# Patient Record
Sex: Female | Born: 1965 | Race: Black or African American | Hispanic: No | Marital: Single | Smoking: Never smoker
Health system: Southern US, Community
[De-identification: ages and names within clinical notes are randomized; demographics above are authoritative.]

## PROBLEM LIST (undated history)

## (undated) DIAGNOSIS — I10 Essential (primary) hypertension: Secondary | ICD-10-CM

## (undated) DIAGNOSIS — C169 Malignant neoplasm of stomach, unspecified: Secondary | ICD-10-CM

## (undated) DIAGNOSIS — D649 Anemia, unspecified: Secondary | ICD-10-CM

## (undated) DIAGNOSIS — Z5189 Encounter for other specified aftercare: Secondary | ICD-10-CM

## (undated) HISTORY — PX: COLON SURGERY: SHX602

## (undated) HISTORY — DX: Encounter for other specified aftercare: Z51.89

## (undated) HISTORY — PX: TUBAL LIGATION: SHX77

## (undated) HISTORY — DX: Malignant neoplasm of stomach, unspecified: C16.9

---

## 2001-04-16 ENCOUNTER — Ambulatory Visit (HOSPITAL_COMMUNITY): Admission: AD | Admit: 2001-04-16 | Discharge: 2001-04-16 | Payer: Self-pay | Admitting: *Deleted

## 2001-05-07 ENCOUNTER — Inpatient Hospital Stay (HOSPITAL_COMMUNITY): Admission: AD | Admit: 2001-05-07 | Discharge: 2001-05-09 | Payer: Self-pay | Admitting: *Deleted

## 2001-06-23 ENCOUNTER — Other Ambulatory Visit: Admission: RE | Admit: 2001-06-23 | Discharge: 2001-06-23 | Payer: Self-pay | Admitting: *Deleted

## 2005-05-07 ENCOUNTER — Emergency Department (HOSPITAL_COMMUNITY): Admission: EM | Admit: 2005-05-07 | Discharge: 2005-05-07 | Payer: Self-pay | Admitting: Emergency Medicine

## 2008-03-15 ENCOUNTER — Emergency Department (HOSPITAL_COMMUNITY): Admission: EM | Admit: 2008-03-15 | Discharge: 2008-03-15 | Payer: Self-pay | Admitting: Emergency Medicine

## 2009-11-20 ENCOUNTER — Emergency Department (HOSPITAL_COMMUNITY): Admission: EM | Admit: 2009-11-20 | Discharge: 2009-11-21 | Payer: Self-pay | Admitting: Emergency Medicine

## 2011-04-01 ENCOUNTER — Emergency Department (HOSPITAL_COMMUNITY)
Admission: EM | Admit: 2011-04-01 | Discharge: 2011-04-02 | Disposition: A | Discharge status: Home / Self Care | Payer: PRIVATE HEALTH INSURANCE | Attending: Emergency Medicine | Admitting: Emergency Medicine

## 2011-04-01 DIAGNOSIS — I10 Essential (primary) hypertension: Secondary | ICD-10-CM | POA: Insufficient documentation

## 2011-04-01 DIAGNOSIS — M2550 Pain in unspecified joint: Secondary | ICD-10-CM | POA: Insufficient documentation

## 2011-04-02 LAB — DIFFERENTIAL
Band Neutrophils: 0 % (ref 0–10)
Basophils Absolute: 0 10*3/uL (ref 0.0–0.1)
Basophils Relative: 0 % (ref 0–1)
Blasts: 0 %
Lymphocytes Relative: 22 % (ref 12–46)
Metamyelocytes Relative: 0 %
Monocytes Absolute: 0.6 10*3/uL (ref 0.1–1.0)
Monocytes Relative: 6 % (ref 3–12)
Neutro Abs: 7.4 10*3/uL (ref 1.7–7.7)
Neutrophils Relative %: 70 % (ref 43–77)
Promyelocytes Absolute: 0 %

## 2011-04-02 LAB — CBC
HCT: 39.1 % (ref 36.0–46.0)
Hemoglobin: 13 g/dL (ref 12.0–15.0)
MCHC: 33.2 g/dL (ref 30.0–36.0)
Platelets: 384 10*3/uL (ref 150–400)
RBC: 4.56 MIL/uL (ref 3.87–5.11)
RDW: 13.6 % (ref 11.5–15.5)
WBC: 10.5 10*3/uL (ref 4.0–10.5)

## 2011-04-02 LAB — POCT I-STAT, CHEM 8
BUN: 4 mg/dL — ABNORMAL LOW (ref 6–23)
Glucose, Bld: 91 mg/dL (ref 70–99)
Hemoglobin: 14.6 g/dL (ref 12.0–15.0)

## 2011-06-12 ENCOUNTER — Other Ambulatory Visit: Payer: Self-pay | Admitting: Internal Medicine

## 2011-06-12 DIAGNOSIS — Z1231 Encounter for screening mammogram for malignant neoplasm of breast: Secondary | ICD-10-CM

## 2011-06-27 ENCOUNTER — Ambulatory Visit
Admission: RE | Admit: 2011-06-27 | Discharge: 2011-06-27 | Disposition: A | Discharge status: Home / Self Care | Payer: PRIVATE HEALTH INSURANCE | Source: Ambulatory Visit | Attending: Internal Medicine | Admitting: Internal Medicine

## 2011-06-27 DIAGNOSIS — Z1231 Encounter for screening mammogram for malignant neoplasm of breast: Secondary | ICD-10-CM

## 2011-07-17 LAB — RAPID STREP SCREEN (MED CTR MEBANE ONLY): Streptococcus, Group A Screen (Direct): POSITIVE — AB

## 2013-07-14 ENCOUNTER — Other Ambulatory Visit: Payer: Self-pay | Admitting: Family Medicine

## 2013-07-14 DIAGNOSIS — Z1231 Encounter for screening mammogram for malignant neoplasm of breast: Secondary | ICD-10-CM

## 2013-08-17 ENCOUNTER — Ambulatory Visit
Admission: RE | Admit: 2013-08-17 | Discharge: 2013-08-17 | Disposition: A | Discharge status: Home / Self Care | Payer: PRIVATE HEALTH INSURANCE | Source: Ambulatory Visit | Attending: Family Medicine | Admitting: Family Medicine

## 2013-08-17 DIAGNOSIS — Z1231 Encounter for screening mammogram for malignant neoplasm of breast: Secondary | ICD-10-CM

## 2015-06-02 ENCOUNTER — Emergency Department (HOSPITAL_COMMUNITY)
Admission: EM | Admit: 2015-06-02 | Discharge: 2015-06-02 | Disposition: A | Discharge status: Home / Self Care | Payer: PRIVATE HEALTH INSURANCE | Attending: Emergency Medicine | Admitting: Emergency Medicine

## 2015-06-02 ENCOUNTER — Encounter (HOSPITAL_COMMUNITY): Payer: Self-pay

## 2015-06-02 DIAGNOSIS — J020 Streptococcal pharyngitis: Secondary | ICD-10-CM | POA: Diagnosis not present

## 2015-06-02 DIAGNOSIS — J029 Acute pharyngitis, unspecified: Secondary | ICD-10-CM | POA: Diagnosis present

## 2015-06-02 LAB — RAPID STREP SCREEN (MED CTR MEBANE ONLY): STREPTOCOCCUS, GROUP A SCREEN (DIRECT): POSITIVE — AB

## 2015-06-02 MED ORDER — PENICILLIN V POTASSIUM 500 MG PO TABS: 500.0000 mg | ORAL_TABLET | Freq: Three times a day (TID) | ORAL | Status: AC

## 2015-06-02 MED ORDER — IBUPROFEN 600 MG PO TABS: 600.0000 mg | ORAL_TABLET | Freq: Four times a day (QID) | ORAL | Status: DC | PRN

## 2015-06-02 MED ORDER — DEXAMETHASONE SODIUM PHOSPHATE 10 MG/ML IJ SOLN
10.0000 mg | Freq: Once | INTRAMUSCULAR | Status: AC
  Administered 2015-06-02: 10 mg via INTRAMUSCULAR
  Filled 2015-06-02: qty 1

## 2015-06-02 NOTE — Discharge Instructions (Signed)
Ibuprofen for pain. Penicillin as prescribed until all gone for infection. Salt water gargles. Rest. Fluids. Follow up with primary care doctor as needed. Return if worsening.   Strep Throat Strep throat is an infection of the throat caused by a bacteria named Streptococcus pyogenes. Your health care provider may call the infection streptococcal "tonsillitis" or "pharyngitis" depending on whether there are signs of inflammation in the tonsils or back of the throat. Strep throat is most common in children aged 5-15 years during the cold months of the year, but it can occur in people of any age during any season. This infection is spread from person to person (contagious) through coughing, sneezing, or other close contact. SIGNS AND SYMPTOMS   Fever or chills.  Painful, swollen, red tonsils or throat.  Pain or difficulty when swallowing.  White or yellow spots on the tonsils or throat.  Swollen, tender lymph nodes or "glands" of the neck or under the jaw.  Red rash all over the body (rare). DIAGNOSIS  Many different infections can cause the same symptoms. A test must be done to confirm the diagnosis so the right treatment can be given. A "rapid strep test" can help your health care provider make the diagnosis in a few minutes. If this test is not available, a light swab of the infected area can be used for a throat culture test. If a throat culture test is done, results are usually available in a day or two. TREATMENT  Strep throat is treated with antibiotic medicine. HOME CARE INSTRUCTIONS   Gargle with 1 tsp of salt in 1 cup of warm water, 3-4 times per day or as needed for comfort.  Family members who also have a sore throat or fever should be tested for strep throat and treated with antibiotics if they have the strep infection.  Make sure everyone in your household washes their hands well.  Do not share food, drinking cups, or personal items that could cause the infection to spread to  others.  You may need to eat a soft food diet until your sore throat gets better.  Drink enough water and fluids to keep your urine clear or pale yellow. This will help prevent dehydration.  Get plenty of rest.  Stay home from school, day care, or work until you have been on antibiotics for 24 hours.  Take medicines only as directed by your health care provider.  Take your antibiotic medicine as directed by your health care provider. Finish it even if you start to feel better. SEEK MEDICAL CARE IF:   The glands in your neck continue to enlarge.  You develop a rash, cough, or earache.  You cough up green, yellow-brown, or bloody sputum.  You have pain or discomfort not controlled by medicines.  Your problems seem to be getting worse rather than better.  You have a fever. SEEK IMMEDIATE MEDICAL CARE IF:   You develop any new symptoms such as vomiting, severe headache, stiff or painful neck, chest pain, shortness of breath, or trouble swallowing.  You develop severe throat pain, drooling, or changes in your voice.  You develop swelling of the neck, or the skin on the neck becomes red and tender.  You develop signs of dehydration, such as fatigue, dry mouth, and decreased urination.  You become increasingly sleepy, or you cannot wake up completely. MAKE SURE YOU:  Understand these instructions.  Will watch your condition.  Will get help right away if you are not doing well or  get worse. Document Released: 10/04/2000 Document Revised: 02/21/2014 Document Reviewed: 12/06/2010 [facility] Patient Information 2015 [place], [place]. This information is not intended to replace advice given to you by your health care provider. Make sure you discuss any questions you have with your health care provider.

## 2015-06-02 NOTE — ED Notes (Signed)
Patient c/o sore throat and swollen neck glands since yesterday. Patient denies any cough, sinus congestion, or headache.

## 2015-06-02 NOTE — ED Provider Notes (Signed)
CSN: [id number]     Arrival date & time 06/02/15  6073 History   First MD Initiated Contact with Patient 06/02/15 [phone]     Chief Complaint  Patient presents with  . Sore Throat  . Lymphadenopathy     (Consider location/radiation/quality/duration/timing/severity/associated sxs/prior Treatment) HPI Linda [name] is a 49 y.o. female medical problems, presents to emergency department complaining of sore throat and swollen lymph nodes in the neck. Patient states symptoms started yesterday. She states that she has not tried anything at home for her symptoms. She denies any fever or chills. Denies any nasal congestion cough, headache. No neck pain or stiffness. She states she has had strep in the past and it feels the same.  History reviewed. No pertinent past medical history. Past Surgical History  Procedure Laterality Date  . Cesarean section      x4   Family History  Problem Relation Age of Onset  . Hypertension Father    Social History  Substance Use Topics  . Smoking status: Never Smoker   . Smokeless tobacco: Never Used  . Alcohol Use: No   OB History    No data available     Review of Systems  Constitutional: Negative for fever and chills.  HENT: Positive for sore throat. Negative for congestion, mouth sores and trouble swallowing.   Respiratory: Negative for cough, chest tightness and shortness of breath.   Cardiovascular: Negative for chest pain, palpitations and leg swelling.  Musculoskeletal: Negative for myalgias, arthralgias, neck pain and neck stiffness.  Skin: Negative for rash.  Neurological: Negative for dizziness, weakness and headaches.  All other systems reviewed and are negative.     Allergies  Review of patient's allergies indicates no known allergies.  Home Medications   Prior to Admission medications   Medication Sig Start Date End Date Taking? Authorizing Provider  Aspirin-Caffeine 845-65 MG PACK Take 1 packet by mouth every 4 (four) hours as  needed (for pain).   Yes Historical Provider, MD   BP 126/78 mmHg  Pulse 100  Temp(Src) 99 F (37.2 C) (Oral)  Resp 16  Ht 5\' 4"  (1.626 m)  Wt 198 lb (89.812 kg)  BMI 33.97 kg/m2  SpO2 95% Physical Exam  Constitutional: She appears well-developed and well-nourished. No distress.  HENT:  Head: Normocephalic and atraumatic.  Right Ear: External ear normal.  Left Ear: External ear normal.  Nose: Nose normal.  Bilaterally enlarged tonsils with exudate. Uvula midline.   Eyes: Conjunctivae are normal.  Neck: Normal range of motion. Neck supple.  Cardiovascular: Normal rate, regular rhythm and normal heart sounds.   Pulmonary/Chest: Effort normal and breath sounds normal. No respiratory distress. She has no wheezes. She has no rales.  Musculoskeletal: She exhibits no edema.  Lymphadenopathy:    She has cervical adenopathy.  Neurological: She is alert.  Skin: Skin is warm and dry.  Psychiatric: She has a normal mood and affect. Her behavior is normal.  Nursing note and vitals reviewed.   ED Course  Procedures (including critical care time) Labs Review Labs Reviewed  RAPID STREP SCREEN (NOT AT [facility]) - Abnormal; Notable for the following:    Streptococcus, Group A Screen (Direct) POSITIVE (*)    All other components within normal limits    Imaging Review No results found. Duane Lope A, personally reviewed and evaluated these images and lab results as part of my medical decision-making.   EKG Interpretation None      MDM   Final diagnoses:  Strep throat    patient with bilaterally enlarged tonsils that are erythematous with exudate. Uvula is midline. Vital signs are normal. Patient is in no distress, swallowing without difficulty. Patient's strep screen is positive. Will discharge home with penicillin, Tylenol Motrin for pain, saltwater gargles. Follow-up as needed. No evidence of peritonsillar abscess at this time.   Filed Vitals:   06/02/15 0943  BP:  126/78  Pulse: 100  Temp: 99 F (37.2 C)  Resp: [id number], PA-C 06/02/15 1638  Lacretia Leigh, MD 06/03/15 1051

## 2019-05-02 ENCOUNTER — Other Ambulatory Visit: Payer: Self-pay

## 2019-05-02 ENCOUNTER — Emergency Department (HOSPITAL_COMMUNITY)
Admission: EM | Admit: 2019-05-02 | Discharge: 2019-05-02 | Disposition: A | Discharge status: Home / Self Care | Payer: PRIVATE HEALTH INSURANCE | Attending: Emergency Medicine | Admitting: Emergency Medicine

## 2019-05-02 ENCOUNTER — Encounter (HOSPITAL_COMMUNITY): Payer: Self-pay

## 2019-05-02 DIAGNOSIS — H9201 Otalgia, right ear: Secondary | ICD-10-CM | POA: Diagnosis present

## 2019-05-02 DIAGNOSIS — H60501 Unspecified acute noninfective otitis externa, right ear: Secondary | ICD-10-CM | POA: Insufficient documentation

## 2019-05-02 MED ORDER — CIPRODEX 0.3-0.1 % OT SUSP: 3.0000 [drp] | Freq: Two times a day (BID) | OTIC | 0 refills | Status: AC

## 2019-05-02 NOTE — Discharge Instructions (Signed)
Place three drops of the antibiotic in the right ear twice a day for one week Take Ibuprofen or Tylenol for pain Return if worsening

## 2019-05-02 NOTE — ED Provider Notes (Signed)
[facility] EM[facility]GENCY DEPARTMENT Provider Note   CSN: [id number] Arrival date & time: 05/02/19  0645     History   Chief Complaint Chief Complaint  Patient presents with  . Otalgia    HPI Linda [name] is a 53 y.o. female presents with right ear pain and swelling.  No significant past medical history.  The patient states that she noticed some swelling discomfort of the right ear about 2 to 3 days ago.  Pain is gradually worsening.  Nothing makes it better or worse.  She has never had this before.  This morning when she woke up the pain was more significant so she decided to come to the emergency department.  She has not put anything in her ear and she has not been swimming recently.  There has not been any drainage coming out of the ear and she states that her hearing is unaffected.  No fever or chills     HPI  History reviewed. No pertinent past medical history.  There are no active problems to display for this patient.   Past Surgical History:  Procedure Laterality Date  . CESAREAN SECTION     x4     OB History   No obstetric history on file.      Home Medications    Prior to Admission medications   Medication Sig Start Date End Date Taking? Authorizing Provider  Aspirin-Caffeine 845-65 MG PACK Take 1 packet by mouth every 4 (four) hours as needed (for pain).    [provider]  ciprofloxacin-dexamethasone (CIPRODEX) OTIC suspension Place 3 drops into the right ear 2 (two) times daily for 7 days. 05/02/19 05/09/19  Recardo Evangelist, PA-C  ibuprofen (ADVIL,MOTRIN) 600 MG tablet Take 1 tablet (600 mg total) by mouth every 6 (six) hours as needed. 06/02/15   Jeannett Senior, PA-C    Family History Family History  Problem Relation Age of Onset  . Hypertension Father     Social History Social History   Tobacco Use  . Smoking status: Never Smoker  . Smokeless tobacco: Never Used  Substance Use Topics  . Alcohol use: No  . Drug  use: No     Allergies   Patient has no known allergies.   Review of Systems Review of Systems  Constitutional: Negative for fever.  HENT: Positive for ear pain. Negative for ear discharge.      Physical Exam Updated Vital Signs BP (!) 176/89 (BP Location: Right Arm)   Pulse 81   Temp 98.5 F (36.9 C) (Oral)   Resp 18   SpO2 98%   Physical Exam Vitals signs and nursing note reviewed.  Constitutional:      General: She is not in acute distress.    Appearance: She is well-developed. She is not ill-appearing.  HENT:     Head: Normocephalic and atraumatic.     Right Ear: Hearing normal. Swelling (Swelling of the inner ear canal and the external concha. No significant drainage) and tenderness (Tragal tenderness) present. No drainage. No mastoid tenderness.     Left Ear: Hearing, tympanic membrane, ear canal and external ear normal.  Eyes:     General: No scleral icterus.       Right eye: No discharge.        Left eye: No discharge.     Conjunctiva/sclera: Conjunctivae normal.     Pupils: Pupils are equal, round, and reactive to light.  Neck:     Musculoskeletal: Normal range of  motion.  Cardiovascular:     Rate and Rhythm: Normal rate.  Pulmonary:     Effort: Pulmonary effort is normal. No respiratory distress.  Abdominal:     General: There is no distension.  Skin:    General: Skin is warm and dry.  Neurological:     Mental Status: She is alert and oriented to person, place, and time.  Psychiatric:        Behavior: Behavior normal.      ED Treatments / Results  Labs (all labs ordered are listed, but only abnormal results are displayed) Labs Reviewed - No data to display  EKG None  Radiology No results found.  Procedures Procedures (including critical care time)  Medications Ordered in ED Medications - No data to display   Initial Impression / Assessment and Plan / ED Course  I have reviewed the triage vital signs and the nursing notes.   Pertinent labs & imaging results that were available during my care of the patient were reviewed by me and considered in my medical decision making (see chart for details).  53 year old female presents with right ear pain and swelling for the past several days.  She is mildly hypertensive but otherwise vital signs are normal.  She is not acutely ill appearing.  Exam is consistent with otitis externa.  There is no mastoid tenderness or erythema.  An ear wick was placed in the ear canal and Ciprodex was administered.  She was given prescription for Ciprodex.  Return precautions given  Final Clinical Impressions(s) / ED Diagnoses   Final diagnoses:  Acute otitis externa of right ear, unspecified type    ED Discharge Orders         Ordered    ciprofloxacin-dexamethasone (CIPRODEX) OTIC suspension  2 times daily     05/02/19 0716           Recardo Evangelist, PA-C 05/02/19 6015    Charlesetta Shanks, MD 05/02/19 [phone]

## 2019-05-02 NOTE — ED Triage Notes (Signed)
Pt states yesterday her R ear began to feel tender on palpation. Then this morning when the patient woke up her was swollen and now painful to touch. From home

## 2019-05-02 NOTE — ED Notes (Signed)
Patient verbalizes understanding of discharge instructions. Opportunity for questioning and answers were provided. Armband removed by staff, pt discharged from ED.  

## 2022-01-09 DIAGNOSIS — M25559 Pain in unspecified hip: Secondary | ICD-10-CM | POA: Diagnosis not present

## 2022-02-18 DIAGNOSIS — R7303 Prediabetes: Secondary | ICD-10-CM

## 2022-02-18 HISTORY — DX: Prediabetes: R73.03

## 2022-02-20 ENCOUNTER — Ambulatory Visit: Payer: BLUE CROSS/BLUE SHIELD | Admitting: Nurse Practitioner

## 2022-02-20 ENCOUNTER — Encounter: Payer: Self-pay | Admitting: Nurse Practitioner

## 2022-02-20 VITALS — BP 144/86 | HR 70 | Temp 98.1°F | Ht 63.6 in | Wt 219.0 lb

## 2022-02-20 DIAGNOSIS — E6609 Other obesity due to excess calories: Secondary | ICD-10-CM

## 2022-02-20 DIAGNOSIS — Z23 Encounter for immunization: Secondary | ICD-10-CM

## 2022-02-20 DIAGNOSIS — I10 Essential (primary) hypertension: Secondary | ICD-10-CM | POA: Diagnosis not present

## 2022-02-20 DIAGNOSIS — Z1211 Encounter for screening for malignant neoplasm of colon: Secondary | ICD-10-CM

## 2022-02-20 DIAGNOSIS — R5383 Other fatigue: Secondary | ICD-10-CM

## 2022-02-20 DIAGNOSIS — Z79899 Other long term (current) drug therapy: Secondary | ICD-10-CM | POA: Diagnosis not present

## 2022-02-20 DIAGNOSIS — Z6838 Body mass index (BMI) 38.0-38.9, adult: Secondary | ICD-10-CM

## 2022-02-20 DIAGNOSIS — Z7689 Persons encountering health services in other specified circumstances: Secondary | ICD-10-CM

## 2022-02-20 DIAGNOSIS — D649 Anemia, unspecified: Secondary | ICD-10-CM | POA: Diagnosis not present

## 2022-02-20 DIAGNOSIS — Z1231 Encounter for screening mammogram for malignant neoplasm of breast: Secondary | ICD-10-CM

## 2022-02-20 NOTE — Patient Instructions (Addendum)
Obesity, Adult ?Obesity is having too much body fat. Being obese means that your weight is more than what is healthy for you.  ?BMI (body mass index) is a number that explains how much body fat you have. If you have a BMI of 30 or more, you are obese. ?Obesity can cause serious health problems, such as: ?Stroke. ?Coronary artery disease (CAD). ?Type 2 diabetes. ?Some types of cancer. ?High blood pressure (hypertension). ?High cholesterol. ?Gallbladder stones. ?Obesity can also contribute to: ?Osteoarthritis. ?Sleep apnea. ?Infertility problems. ?What are the causes? ?Eating meals each day that are high in calories, sugar, and fat. ?Drinking a lot of drinks that have sugar in them. ?Being born with genes that may make you more likely to become obese. ?Having a medical condition that causes obesity. ?Taking certain medicines. ?Sitting a lot (having a sedentary lifestyle). ?Not getting enough sleep. ?What increases the risk? ?Having a family history of obesity. ?Living in an area with limited access to: ?Parks, recreation centers, or sidewalks. ?Healthy food choices, such as grocery stores and farmers' markets. ?What are the signs or symptoms? ?The main sign is having too much body fat. ?How is this treated? ?Treatment for this condition often includes changing your lifestyle. Treatment may include: ?Changing your diet. This may include making a healthy meal plan. ?Exercise. This may include activity that causes your heart to beat faster (aerobic exercise) and strength training. Work with your doctor to design a program that works for you. ?Medicine to help you lose weight. This may be used if you are not able to lose one pound a week after 6 weeks of healthy eating and more exercise. ?Treating conditions that cause the obesity. ?Surgery. Options may include gastric banding and gastric bypass. This may be done if: ?Other treatments have not helped to improve your condition. ?You have a BMI of 40 or higher. ?You have  life-threatening health problems related to obesity. ?Follow these instructions at home: ?Eating and drinking ? ?Follow advice from your doctor about what to eat and drink. Your doctor may tell you to: ?Limit fast food, sweets, and processed snack foods. ?Choose low-fat options. For example, choose low-fat milk instead of whole milk. ?Eat five or more servings of fruits or vegetables each day. ?Eat at home more often. This gives you more control over what you eat. ?Choose healthy foods when you eat out. ?Learn to read food labels. This will help you learn how much food is in one serving. ?Keep low-fat snacks available. ?Avoid drinks that have a lot of sugar in them. These include soda, fruit juice, iced tea with sugar, and flavored milk. ?Drink enough water to keep your pee (urine) pale yellow. ?Do not go on fad diets. ?Physical activity ?Exercise often, as told by your doctor. Most adults should get up to 150 minutes of moderate-intensity exercise every week.Ask your doctor: ?What types of exercise are safe for you. ?How often you should exercise. ?Warm up and stretch before being active. ?Do slow stretching after being active (cool down). ?Rest between times of being active. ?Lifestyle ?Work with your doctor and a food expert (dietitian) to set a weight-loss goal that is best for you. ?Limit your screen time. ?Find ways to reward yourself that do not involve food. ?Do not drink alcohol if: ?Your doctor tells you not to drink. ?You are pregnant, may be pregnant, or are planning to become pregnant. ?If you drink alcohol: ?Limit how much you have to: ?0-1 drink a day for women. ?0-2 drinks   a day for men. ?Know how much alcohol is in your drink. In the [place] one drink equals one 12 oz bottle of beer (355 mL), one 5 oz glass of wine (148 mL), or one 1? oz glass of hard liquor (44 mL). ?General instructions ?Keep a weight-loss journal. This can help you keep track of: ?The food that you eat. ?How much exercise you  get. ?Take over-the-counter and prescription medicines only as told by your doctor. ?Take vitamins and supplements only as told by your doctor. ?Think about joining a support group. ?Pay attention to your mental health as obesity can lead to depression or self esteem issues. ?Keep all follow-up visits. ?Contact a doctor if: ?You cannot meet your weight-loss goal after you have changed your diet and lifestyle for 6 weeks. ?You are having trouble breathing. ?Summary ?Obesity is having too much body fat. ?Being obese means that your weight is more than what is healthy for you. ?Work with your doctor to set a weight-loss goal. ?Get regular exercise as told by your doctor. ?This information is not intended to replace advice given to you by your health care provider. Make sure you discuss any questions you have with your health care provider. ?Document Revised: 05/15/2021 Document Reviewed: 05/15/2021 ?Elsevier Patient Education ? [place]. ? ?Goal to exercise 150 minutes per week with at least 2 days of strength training ?Encouraged to park further when at the store, take stairs instead of elevators and to walk in place during commercials. ?Increase water intake to at least one gallon of water daily. ?Avoid sodas, juices and sugars ?Cut back on carbs (white) ? ?

## 2022-02-20 NOTE — Progress Notes (Signed)
?Industrial/product designer as a Education administrator for [employer], [name].,have documented all relevant documentation on the behalf of [name], [name],as directed by  [name], [name] while in the presence of [name], [place]. ? ?This visit occurred during the SARS-CoV-2 public health emergency.  Safety protocols were in place, including screening questions prior to the visit, additional usage of staff PPE, and extensive cleaning of exam room while observing appropriate contact time as indicated for disinfecting solutions. ? ?Subjective:  ?  ? Patient ID: Linda [name] , female    DOB: 1965/10/29 , 56 y.o.   MRN: [id number] ? ? ?Chief Complaint  ?Patient presents with  ? Establish Care  ? ? ?HPI ? ?Patient is here to establish care. She was referred by her daughter K.S. who is a patient here.  She has not seen a PCP for 4 years. She does office work - sedentary. Single. She has 4 children - adults.  They live local. 3 sons and 1 daughter.  She has concerns with her weight. She does not have a GYN. She was going to [facility] Dr. Tamala Julian ? ?PMH - HTN - fluid pill.  ? ?[facility] - brother - HTN.  ? ?BP Readings from Last 3 Encounters: ?02/20/22 : (!) 144/86 ?05/02/19 : (!) 143/87 ?06/02/15 : 126/78 ? ?She reports her weight is the heaviest she has been. She sits for her job. Does not exercise. She eats breakfast and then once when she gets home from work. She has a problem with her hip on the right. She will get a pain in her hip and unable to lift her leg but not constant for the last 2-3 years. Her lowest weight has been 160 lbs in the last 10 years.  She does not drink a lot of water, only about 20 oz. oJ, tea or soft drink. She eats ice all day. Never been tested for anemia. She wants to be able to do more. Wakes up she is "exhausted" and when she goes up the stairs she is exhausted. Feels if she loses weight she will be more active. She has been on weight loss medications at [facility] but unsure of the name. It did work but not to her  satisfaction.   ? ?  ? ?History reviewed. No pertinent past medical history.  ? ?Family History  ?Problem Relation Age of Onset  ? Memory loss Mother   ? Hypertension Father   ? Diabetes Maternal Grandmother   ? Hypertension Maternal Grandmother   ? Hypertension Maternal Grandfather   ? ? ? ?Current Outpatient Medications:  ?  ferrous sulfate 325 (65 FE) MG tablet, Take 1 tablet (325 mg total) by mouth daily., Disp: 30 tablet, Rfl: 0 ?  metFORMIN (GLUCOPHAGE) 500 MG tablet, Take 1 tablet (500 mg total) by mouth 2 (two) times daily with a meal. (Patient not taking: Reported on 02/22/2022), Disp: 60 tablet, Rfl: 2 ?  Vitamin D, Ergocalciferol, (DRISDOL) 1.25 MG (50000 UNIT) CAPS capsule, Take 1 capsule (50,000 Units total) by mouth 2 (two) times a week. (Patient not taking: Reported on 02/22/2022), Disp: 24 capsule, Rfl: 1  ? ?No Known Allergies  ? ?Review of Systems  ?Constitutional:  Positive for fatigue.  ?Respiratory: Negative.    ?Cardiovascular: Negative.   ?Gastrointestinal: Negative.   ?Neurological: Negative.   ?Psychiatric/Behavioral: Negative.     ? ?Today's Vitals  ? 02/20/22 1616  ?BP: (!) 144/86  ?Pulse: 70  ?Temp: 98.1 ?F (36.7 ?C)  ?TempSrc: Oral  ?Weight: 219  lb (99.3 kg)  ?Height: 5' 3.6" (1.615 m)  ? ?Body mass index is 38.07 kg/m?.  ? ?Objective:  ?Physical Exam ?Vitals reviewed.  ?Constitutional:   ?   General: She is not in acute distress. ?   Appearance: Normal appearance. She is well-developed. She is obese.  ?HENT:  ?   Head: Normocephalic and atraumatic.  ?Eyes:  ?   Pupils: Pupils are equal, round, and reactive to light.  ?Cardiovascular:  ?   Rate and Rhythm: Normal rate and regular rhythm.  ?   Pulses: Normal pulses.  ?   Heart sounds: Normal heart sounds. No murmur heard. ?Pulmonary:  ?   Effort: Pulmonary effort is normal. No respiratory distress.  ?   Breath sounds: Normal breath sounds. No wheezing.  ?Skin: ?   General: Skin is warm and dry.  ?   Capillary Refill: Capillary refill takes  less than 2 seconds.  ?Neurological:  ?   General: No focal deficit present.  ?   Mental Status: She is alert and oriented to person, place, and time.  ?   Cranial Nerves: No cranial nerve deficit.  ?   Motor: No weakness.  ?Psychiatric:     ?   Mood and Affect: Mood normal.     ?   Behavior: Behavior normal.     ?   Thought Content: Thought content normal.     ?   Judgment: Judgment normal.  ?  ? ?   ?Assessment And Plan:  ?   ?1. Other fatigue ?Comments: Will check for metabolic causes. Denies shortness of breath ?- TSH ?- Hemoglobin A1c ?- Vitamin B12 ?- CBC ?- Vitamin D (25 hydroxy) ? ?2. Essential hypertension ?Comments: Blood pressure is elevated, will consider starting her on a blood pressure medication at her next visit. Encouraged to eat a low salt diet. ?- CMP14+EGFR ? ?3. Class 2 obesity due to excess calories without serious comorbidity with body mass index (BMI) of 38.0 to 38.9 in adult ?Comments: Discussion about weight loss options however would like to evaluate her fatigue first. Encouraged to keep a food log and increase activity as tolerated ? ?4. Encounter for screening colonoscopy ?According to USPTF Colorectal cancer Screening guidelines. Colonoscopy is recommended every 10 years, starting at age 65 years. ?Will refer to GI for colon cancer screening. ?- Ambulatory referral to Gastroenterology ? ?5. Encounter for screening mammogram for breast cancer ?Pt instructed on Self Breast Exam.According to ACOG guidelines Women aged 42 and older are recommended to get an annual mammogram. Form completed and given to patient contact the [facility] for appointment scheduing.  ?Pt encouraged to get annual mammogram ?- MM Digital Screening; Future ? ?6. Need for Tdap vaccination ?Will give tetanus vaccine today while in office. Refer to order management. TDAP will be administered to adults 32-73 years old every 10 years. ?- Tdap vaccine greater than or equal to 7yo IM ? ?7. Establishing care with new  doctor, encounter for ?She had been a patient at [facility] 4 years ago will have her to sign a medical record release form.   ? ? ?Patient was given opportunity to ask questions. Patient verbalized understanding of the plan and was able to repeat key elements of the plan. All questions were answered to their satisfaction.  ?[name], [name]  ? ?I, [name], [name], have reviewed all documentation for this visit. The documentation on 02/22/22 for the exam, diagnosis, procedures, and orders are all accurate and complete.  ? ?  IF YOU HAVE BEEN REFERRED TO A SPECIALIST, IT MAY TAKE 1-2 WEEKS TO SCHEDULE/PROCESS THE REFERRAL. IF YOU HAVE NOT HEARD FROM US/SPECIALIST IN TWO WEEKS, PLEASE GIVE [place] A CALL AT [phone].  ? ?THE PATIENT IS ENCOURAGED TO PRACTICE SOCIAL DISTANCING DUE TO THE COVID-19 PANDEMIC.   ?

## 2022-02-21 ENCOUNTER — Encounter (HOSPITAL_COMMUNITY): Payer: Self-pay | Admitting: Emergency Medicine

## 2022-02-21 ENCOUNTER — Other Ambulatory Visit: Payer: Self-pay | Admitting: Nurse Practitioner

## 2022-02-21 ENCOUNTER — Other Ambulatory Visit (HOSPITAL_COMMUNITY): Payer: Self-pay

## 2022-02-21 ENCOUNTER — Other Ambulatory Visit: Payer: Self-pay

## 2022-02-21 ENCOUNTER — Emergency Department (HOSPITAL_COMMUNITY)
Admission: EM | Admit: 2022-02-21 | Discharge: 2022-02-22 | Disposition: A | Discharge status: Home / Self Care | Payer: BLUE CROSS/BLUE SHIELD | Attending: Emergency Medicine | Admitting: Emergency Medicine

## 2022-02-21 DIAGNOSIS — R195 Other fecal abnormalities: Secondary | ICD-10-CM

## 2022-02-21 DIAGNOSIS — D649 Anemia, unspecified: Secondary | ICD-10-CM

## 2022-02-21 DIAGNOSIS — R5383 Other fatigue: Secondary | ICD-10-CM

## 2022-02-21 DIAGNOSIS — D509 Iron deficiency anemia, unspecified: Secondary | ICD-10-CM | POA: Insufficient documentation

## 2022-02-21 DIAGNOSIS — R799 Abnormal finding of blood chemistry, unspecified: Secondary | ICD-10-CM | POA: Diagnosis not present

## 2022-02-21 DIAGNOSIS — D75839 Thrombocytosis, unspecified: Secondary | ICD-10-CM

## 2022-02-21 DIAGNOSIS — D696 Thrombocytopenia, unspecified: Secondary | ICD-10-CM | POA: Diagnosis not present

## 2022-02-21 DIAGNOSIS — K921 Melena: Secondary | ICD-10-CM | POA: Diagnosis not present

## 2022-02-21 LAB — BASIC METABOLIC PANEL
Anion gap: 7 (ref 5–15)
BUN: 10 mg/dL (ref 6–20)
CO2: 24 mmol/L (ref 22–32)
Calcium: 9.3 mg/dL (ref 8.9–10.3)
Chloride: 106 mmol/L (ref 98–111)
Creatinine, Ser: 0.86 mg/dL (ref 0.44–1.00)
GFR, Estimated: >60 mL/min (ref >60)
Glucose, Bld: 99 mg/dL (ref 70–99)
Potassium: 3.5 mmol/L (ref 3.5–5.1)
Sodium: 137 mmol/L (ref 135–145)

## 2022-02-21 LAB — CMP14+EGFR
ALT: 10 IU/L (ref 0–32)
AST: 15 IU/L (ref 0–40)
Albumin/Globulin Ratio: 1 — ABNORMAL LOW (ref 1.2–2.2)
Albumin: 4.6 g/dL (ref 3.8–4.9)
Alkaline Phosphatase: 114 IU/L (ref 44–121)
BUN/Creatinine Ratio: 10 (ref 9–23)
BUN: 8 mg/dL (ref 6–24)
Bilirubin Total: 0.3 mg/dL (ref 0.0–1.2)
CO2: 22 mmol/L (ref 20–29)
Calcium: 9.6 mg/dL (ref 8.7–10.2)
Chloride: 101 mmol/L (ref 96–106)
Creatinine, Ser: 0.81 mg/dL (ref 0.57–1.00)
Globulin, Total: 4.5 g/dL (ref 1.5–4.5)
Glucose: 84 mg/dL (ref 70–99)
Potassium: 4.1 mmol/L (ref 3.5–5.2)
Sodium: 138 mmol/L (ref 134–144)
Total Protein: 9.1 g/dL — ABNORMAL HIGH (ref 6.0–8.5)
eGFR: 86 mL/min/{1.73_m2} (ref >59)

## 2022-02-21 LAB — CBC WITH DIFFERENTIAL/PLATELET
Abs Immature Granulocytes: 0.03 10*3/uL (ref 0.00–0.07)
Basophils Absolute: 0 10*3/uL (ref 0.0–0.1)
Basophils Relative: 0 %
Eosinophils Absolute: 0.1 10*3/uL (ref 0.0–0.5)
Eosinophils Relative: 1 %
HCT: 26.4 % — ABNORMAL LOW (ref 36.0–46.0)
Hemoglobin: 7 g/dL — ABNORMAL LOW (ref 12.0–15.0)
Immature Granulocytes: 0 %
Lymphocytes Relative: 31 %
Lymphs Abs: 3.1 10*3/uL (ref 0.7–4.0)
MCH: 17.5 pg — ABNORMAL LOW (ref 26.0–34.0)
MCHC: 26.5 g/dL — ABNORMAL LOW (ref 30.0–36.0)
MCV: 65.8 fL — ABNORMAL LOW (ref 80.0–100.0)
Monocytes Absolute: 0.6 10*3/uL (ref 0.1–1.0)
Monocytes Relative: 6 %
Neutro Abs: 5.9 10*3/uL (ref 1.7–7.7)
Neutrophils Relative %: 62 %
Platelets: 419 10*3/uL — ABNORMAL HIGH (ref 150–400)
RBC: 4.01 MIL/uL (ref 3.87–5.11)
RDW: 19.7 % — ABNORMAL HIGH (ref 11.5–15.5)
WBC: 9.7 10*3/uL (ref 4.0–10.5)
nRBC: 0 % (ref 0.0–0.2)

## 2022-02-21 LAB — CBC
Hematocrit: 25.2 % — ABNORMAL LOW (ref 34.0–46.6)
Hemoglobin: 6.6 g/dL — CL (ref 11.1–15.9)
MCH: 16.9 pg — ABNORMAL LOW (ref 26.6–33.0)
MCHC: 26.2 g/dL — ABNORMAL LOW (ref 31.5–35.7)
MCV: 65 fL — ABNORMAL LOW (ref 79–97)
Platelets: 435 10*3/uL (ref 150–450)
RBC: 3.91 x10E6/uL (ref 3.77–5.28)
RDW: 18.1 % — ABNORMAL HIGH (ref 11.7–15.4)
WBC: 9.2 10*3/uL (ref 3.4–10.8)

## 2022-02-21 LAB — HEMOGLOBIN A1C
Est. average glucose Bld gHb Est-mCnc: 128 mg/dL
Hgb A1c MFr Bld: 6.1 % — ABNORMAL HIGH (ref 4.8–5.6)

## 2022-02-21 LAB — TSH: TSH: 1.41 u[IU]/mL (ref 0.450–4.500)

## 2022-02-21 LAB — VITAMIN B12: Vitamin B-12: 517 pg/mL (ref 232–1245)

## 2022-02-21 LAB — ABO/RH: ABO/RH(D): O POS

## 2022-02-21 LAB — VITAMIN D 25 HYDROXY (VIT D DEFICIENCY, FRACTURES): Vit D, 25-Hydroxy: 5.8 ng/mL — ABNORMAL LOW (ref 30.0–100.0)

## 2022-02-21 MED ORDER — VITAMIN D (ERGOCALCIFEROL) 1.25 MG (50000 UNIT) PO CAPS
50000.0000 [IU] | ORAL_CAPSULE | ORAL | 1 refills | Status: DC
  Filled 2022-02-21: qty 24, 84d supply, fill #0
  Filled 2022-06-26: qty 24, 84d supply, fill #1

## 2022-02-21 MED ORDER — METFORMIN HCL 500 MG PO TABS
500.0000 mg | ORAL_TABLET | Freq: Two times a day (BID) | ORAL | 2 refills | Status: DC
  Filled 2022-02-21: qty 60, 30d supply, fill #0
  Filled 2022-05-17: qty 60, 30d supply, fill #1

## 2022-02-21 NOTE — ED Triage Notes (Signed)
Pt reported to ED for evaluation of low hemoglobin after being called by PCP for abnormally low results after blood work yesterday. Pt endorses chronic fatigue. Doe not have hx of anemia. Denies any abdominal issues, rectal pain or bleeding.  ?

## 2022-02-21 NOTE — ED Provider Triage Note (Signed)
Emergency Medicine Provider Triage Evaluation Note ? ?Linda [name] , a 56 y.o. female  was evaluated in triage.  Pt complains of anemia and shortness of breath.  Patient has a history of anemia.  She went in for routine blood work today.  She was told to come to the ER for low hemoglobin by her PCP.  She has associated cold intolerance, fatigue and now having exertional dyspnea which is new, history of heavy periods.  No previous history of blood transfusions. ? ?Review of Systems  ?Positive: Weakness ?Negative: Fever ? ?Physical Exam  ?BP 140/84 (BP Location: Left Arm)   Pulse 70   Temp 98.7 ?F (37.1 ?C) (Oral)   Resp 20   SpO2 100%  ?Gen:   Awake, no distress   ?Resp:  Normal effort  ?MSK:   Moves extremities without difficulty  ?Other:  Pale ? ?Medical Decision Making  ?Medically screening exam initiated at 7:38 PM.  Appropriate orders placed.  Linda [name] was informed that the remainder of the evaluation will be completed by another provider, this initial triage assessment does not replace that evaluation, and the importance of remaining in the ED until their evaluation is complete. ? ?Work-up initiated ?  ?Margarita Mail, PA-C ?02/21/22 1938 ? ?

## 2022-02-22 ENCOUNTER — Other Ambulatory Visit (HOSPITAL_COMMUNITY): Payer: Self-pay

## 2022-02-22 LAB — VITAMIN B12: Vitamin B-12: 465 pg/mL (ref 180–914)

## 2022-02-22 LAB — PREPARE RBC (CROSSMATCH)

## 2022-02-22 LAB — IRON AND TIBC
Iron: 11 ug/dL — ABNORMAL LOW (ref 28–170)
Saturation Ratios: 2 % — ABNORMAL LOW (ref 10.4–31.8)
TIBC: 539 ug/dL — ABNORMAL HIGH (ref 250–450)
UIBC: 528 ug/dL

## 2022-02-22 LAB — POC OCCULT BLOOD, ED: Fecal Occult Bld: POSITIVE — AB

## 2022-02-22 LAB — FERRITIN: Ferritin: 3 ng/mL — ABNORMAL LOW (ref 11–307)

## 2022-02-22 LAB — FOLATE: Folate: 22.5 ng/mL (ref >5.9)

## 2022-02-22 MED ORDER — FERROUS SULFATE 325 (65 FE) MG PO TABS
325.0000 mg | ORAL_TABLET | Freq: Every day | ORAL | 0 refills | Status: DC
  Filled 2022-02-22 – 2022-12-16 (×2): qty 30, 30d supply, fill #0

## 2022-02-22 MED ORDER — SODIUM CHLORIDE 0.9 % IV SOLN
10.0000 mL/h | Freq: Once | INTRAVENOUS | Status: AC
Start: 2022-02-22 — End: 2022-02-22
  Administered 2022-02-22: 10 mL/h via INTRAVENOUS

## 2022-02-22 NOTE — ED Provider Notes (Signed)
Patient signed out to me is pending discharge after blood transfusions.  Blood transfusions completed without adverse event. ? ?Blood pressure and vital signs remained stable.  Patient continues in no acute distress.  Discharged home with outpatient follow-up information provided. ?  ?Luna Fuse, MD ?02/22/22 1229 ? ?

## 2022-02-22 NOTE — ED Provider Notes (Signed)
?[facility] ?Provider Note ? ? ?CSN: [id number] ?Arrival date & time: 02/21/22  1837 ? ?  ? ?History ? ?Chief Complaint  ?Patient presents with  ? Abnormal Lab  ? ? ?Linda [name] is a 56 y.o. female. ? ?The history is provided by the patient.  ?Abnormal Lab ?She was told to come in because when she saw a new primary care provider, she had blood drawn which showed a very low hemoglobin.  She was told that she needed to have a blood transfusion.  She endorses about a 64-monthhistory of generalized fatigue, exertional dyspnea with activities such as climbing steps.  This has been generally stable.  She also eats ice all day long.  She states that she only has a few periods a year now, but they are heavy and she does pass clots.  She has noted that her stool is darker than normal but she has not noted any blood.  She denies NSAID use and is not on any anticoagulants. ?  ?Home Medications ?Prior to Admission medications   ?Medication Sig Start Date End Date Taking? Authorizing Provider  ?ferrous sulfate 325 (65 FE) MG tablet Take 1 tablet (325 mg total) by mouth daily. 50/0/37 Yes GDelora Fuel MD  ?metFORMIN (GLUCOPHAGE) 500 MG tablet Take 1 tablet (500 mg total) by mouth 2 (two) times daily with a meal. 02/21/22   MMinette Brine [name]  ?Vitamin D, Ergocalciferol, (DRISDOL) 1.25 MG (50000 UNIT) CAPS capsule Take 1 capsule (50,000 Units total) by mouth 2 (two) times a week. 02/21/22   MMinette Brine [name]  ?   ? ?Allergies    ?Patient has no known allergies.   ? ?Review of Systems   ?Review of Systems  ?All other systems reviewed and are negative. ? ?Physical Exam ?Updated Vital Signs ?BP (!) 154/70 (BP Location: Left Arm)   Pulse 64   Temp 98.7 ?F (37.1 ?C) (Oral)   Resp 16   SpO2 100%  ?Physical Exam ?Vitals and nursing note reviewed. Exam conducted with a chaperone present.  ?56year old female, resting comfortably and in no acute distress. Vital signs are significant for elevated  blood pressure. Oxygen saturation is 100%, which is normal. ?Head is normocephalic and atraumatic. PERRLA, EOMI. Oropharynx is clear.  Conjunctiva are pale. ?Neck is nontender and supple without adenopathy or JVD. ?Back is nontender and there is no CVA tenderness. ?Lungs are clear without rales, wheezes, or rhonchi. ?Chest is nontender. ?Heart has regular rate and rhythm without murmur. ?Abdomen is soft, flat, nontender. ?Rectal: Normal sphincter tone, small amount of brown stool present which is Hemoccult positive. ?Extremities have no cyanosis or edema, full range of motion is present. ?Skin is warm and dry without rash. ?Neurologic: Mental status is normal, cranial nerves are intact, moves all extremities equally. ? ?ED Results / Procedures / Treatments   ?Labs ?(all labs ordered are listed, but only abnormal results are displayed) ?Labs Reviewed  ?CBC WITH DIFFERENTIAL/PLATELET - Abnormal; Notable for the following components:  ?    Result Value  ? Hemoglobin 7.0 (*)   ? HCT 26.4 (*)   ? MCV 65.8 (*)   ? MCH 17.5 (*)   ? MCHC 26.5 (*)   ? RDW 19.7 (*)   ? Platelets 419 (*)   ? All other components within normal limits  ?POC OCCULT BLOOD, ED - Abnormal; Notable for the following components:  ? Fecal Occult Bld POSITIVE (*)   ? All  other components within normal limits  ?BASIC METABOLIC PANEL  ?VITAMIN O27  ?FOLATE  ?IRON AND TIBC  ?FERRITIN  ?RETICULOCYTES  ?TYPE AND SCREEN  ?ABO/RH  ?PREPARE RBC (CROSSMATCH)  ? ?Procedures ?Procedures  ? ? ?Medications Ordered in ED ?Medications  ?0.9 %  sodium chloride infusion (has no administration in time range)  ? ? ?ED Course/ Medical Decision Making/ A&P ?  ?                        ?Medical Decision Making ?Amount and/or Complexity of Data Reviewed ?Labs: ordered. ? ?Risk ?OTC drugs. ?Prescription drug management. ? ? ?Symptomatic anemia with Hemoccult positive stool.  Old records reviewed showing hemoglobin drawn as an outpatient was 6.6.  Today, hemoglobin is 7.0.   Incidental finding of mild thrombocytosis is present, probably not clinically significant.  RBC indices are hypochromic and microcytic, consistent with iron deficiency anemia.  Prior to this, last hemoglobin on record was from 2012, and was normal with normal RBC indices.  Anemia panel was drawn to make sure that she does not have other contributing factors.  She will be given 2 units of blood to get immediate symptomatic relief, and is referred to gastroenterology for upper and lower endoscopy to try to find the cause for her Hemoccult positive stool.  In the meantime, she will be placed on oral iron therapy. ? ?CRITICAL CARE ?Performed by: Delora Fuel ?Total critical care time: 35 minutes ?Critical care time was exclusive of separately billable procedures and treating other patients. ?Critical care was necessary to treat or prevent imminent or life-threatening deterioration. ?Critical care was time spent personally by me on the following activities: development of treatment plan with patient and/or surrogate as well as nursing, discussions with consultants, evaluation of patient's response to treatment, examination of patient, obtaining history from patient or surrogate, ordering and performing treatments and interventions, ordering and review of laboratory studies, ordering and review of radiographic studies, pulse oximetry and re-evaluation of patient's condition. ? ?Final Clinical Impression(s) / ED Diagnoses ?Final diagnoses:  ?Symptomatic anemia  ?Guaiac positive stools  ?Thrombocytosis  ? ? ?Rx / DC Orders ?ED Discharge Orders   ? ?      Ordered  ?  Ambulatory referral to Gastroenterology       ? 02/22/22 0548  ?  ferrous sulfate 325 (65 FE) MG tablet  Daily       ? 02/22/22 0548  ? ?  ?  ? ?  ? ? ?  ?Delora Fuel, MD ?74/12/87 [phone] ? ?

## 2022-02-22 NOTE — Discharge Instructions (Addendum)
Your exam showed that you are having some internal bleeding.  You need to see a gastroenterologist so that you can have a colonoscopy and upper endoscopy to find out what is bleeding and treated appropriately.  It is very important that you see the gastroenterologist, because one of the things that can cause bleeding is colon cancer. ?

## 2022-02-23 LAB — TYPE AND SCREEN
ABO/RH(D): O POS
Antibody Screen: NEGATIVE
Unit division: 0
Unit division: 0

## 2022-02-23 LAB — BPAM RBC
Blood Product Expiration Date: 202305312359
Blood Product Expiration Date: 202306012359
ISSUE DATE / TIME: 202305050607
ISSUE DATE / TIME: 202305050915
Unit Type and Rh: 5100
Unit Type and Rh: 5100

## 2022-02-26 ENCOUNTER — Telehealth: Payer: Self-pay | Admitting: Nurse Practitioner

## 2022-02-26 LAB — SPECIMEN STATUS REPORT

## 2022-02-26 LAB — IRON,TIBC AND FERRITIN PANEL
Ferritin: 5 ng/mL — ABNORMAL LOW (ref 15–150)
Iron Saturation: 3 % — CL (ref 15–55)
Iron: 12 ug/dL — ABNORMAL LOW (ref 27–159)
Total Iron Binding Capacity: 459 ug/dL — ABNORMAL HIGH (ref 250–450)
UIBC: 447 ug/dL — ABNORMAL HIGH (ref 131–425)

## 2022-02-26 NOTE — Telephone Encounter (Signed)
Spoke with patient about her visit to the ED for a blood transfusion she had 2 PRBCs infused and states she is feeling "a lot better, I can tell a difference".  She also had a positive hemoccult and was referred to GI for further evaluation, appt is tomorrow. Patient is to provide an update we may need to recheck her Hgb.  ?

## 2022-02-27 ENCOUNTER — Other Ambulatory Visit (INDEPENDENT_AMBULATORY_CARE_PROVIDER_SITE_OTHER): Payer: BLUE CROSS/BLUE SHIELD

## 2022-02-27 ENCOUNTER — Ambulatory Visit (INDEPENDENT_AMBULATORY_CARE_PROVIDER_SITE_OTHER): Payer: BLUE CROSS/BLUE SHIELD | Admitting: Internal Medicine

## 2022-02-27 ENCOUNTER — Encounter: Payer: Self-pay | Admitting: Internal Medicine

## 2022-02-27 ENCOUNTER — Other Ambulatory Visit (HOSPITAL_COMMUNITY): Payer: Self-pay

## 2022-02-27 VITALS — BP 140/88 | HR 84 | Ht 64.0 in | Wt 211.2 lb

## 2022-02-27 DIAGNOSIS — R195 Other fecal abnormalities: Secondary | ICD-10-CM

## 2022-02-27 DIAGNOSIS — D509 Iron deficiency anemia, unspecified: Secondary | ICD-10-CM | POA: Diagnosis not present

## 2022-02-27 DIAGNOSIS — N926 Irregular menstruation, unspecified: Secondary | ICD-10-CM | POA: Diagnosis not present

## 2022-02-27 LAB — CBC WITH DIFFERENTIAL/PLATELET
Basophils Absolute: 0 10*3/uL (ref 0.0–0.1)
Basophils Relative: 0.6 % (ref 0.0–3.0)
Eosinophils Absolute: 0.1 10*3/uL (ref 0.0–0.7)
Eosinophils Relative: 1 % (ref 0.0–5.0)
HCT: 31.4 % — ABNORMAL LOW (ref 36.0–46.0)
Hemoglobin: 9.5 g/dL — ABNORMAL LOW (ref 12.0–15.0)
Lymphocytes Relative: 27.3 % (ref 12.0–46.0)
Lymphs Abs: 2.2 10*3/uL (ref 0.7–4.0)
MCHC: 30.2 g/dL (ref 30.0–36.0)
MCV: 67.4 fl — ABNORMAL LOW (ref 78.0–100.0)
Monocytes Absolute: 0.5 10*3/uL (ref 0.1–1.0)
Monocytes Relative: 6.6 % (ref 3.0–12.0)
Neutro Abs: 5.1 10*3/uL (ref 1.4–7.7)
Neutrophils Relative %: 64.5 % (ref 43.0–77.0)
Platelets: 370 10*3/uL (ref 150.0–400.0)
RBC: 4.65 Mil/uL (ref 3.87–5.11)
RDW: 27.7 % — ABNORMAL HIGH (ref 11.5–15.5)
WBC: 8 10*3/uL (ref 4.0–10.5)

## 2022-02-27 MED ORDER — NA SULFATE-K SULFATE-MG SULF 17.5-3.13-1.6 GM/177ML PO SOLN
1.0000 | Freq: Once | ORAL | 0 refills | Status: AC
  Filled 2022-02-27 – 2022-03-22 (×2): qty 354, 1d supply, fill #0

## 2022-02-27 NOTE — Progress Notes (Signed)
? ?Chief Complaint: IDA ? ?HPI : 56 year female with history of obesity presents with IDA ? ?She was recently diagnosed with anemia by her PCP on routine labs to evaluate for fatigue. Because she was found to have a Hb of 6.6, she was instructed to go to the ED to get a blood transfusion. Prior to that she had been experiencing 4 months of generalized fatigeu and DOE. She received 2 U pRBCs and was started on oral iron supplements. In the ED, she was found to be hemoccult positive. Denies melena, hematochezia. Denies N&V, dysphagia, weight loss, diarrhea, constipation. Denies fam hx of GI isuses. Denies use of blood thinners. Denies NSAIDs. Denies SOB or lightheadedness currently. Denies prior colonoscopy or EGD. She has had 4 C-sections in the past and a tubal ligation ? ?She doesn't have a menstrual cycle very often, but she will see clots of tissue when it does happen.  ? ?History reviewed. No pertinent past medical history. ? ? ?Past Surgical History:  ?Procedure Laterality Date  ? CESAREAN SECTION    ? x4  ? TUBAL LIGATION    ? ?Family History  ?Problem Relation Age of Onset  ? Memory loss Mother   ? Hypertension Father   ? Hypertension Brother   ? Diabetes Maternal Grandmother   ? Hypertension Maternal Grandmother   ? Hypertension Maternal Grandfather   ? Colon cancer Neg Hx   ? Stomach cancer Neg Hx   ? Esophageal cancer Neg Hx   ? ?Social History  ? ?Tobacco Use  ? Smoking status: Never  ? Smokeless tobacco: Never  ?Substance Use Topics  ? Alcohol use: No  ? Drug use: No  ? ?Current Outpatient Medications  ?Medication Sig Dispense Refill  ? ferrous sulfate 325 (65 FE) MG tablet Take 1 tablet (325 mg total) by mouth daily. 30 tablet 0  ? metFORMIN (GLUCOPHAGE) 500 MG tablet Take 1 tablet (500 mg total) by mouth 2 (two) times daily with a meal. 60 tablet 2  ? Na Sulfate-K Sulfate-Mg Sulf 17.5-3.13-1.6 GM/177ML SOLN Take 1 kit by mouth once for 1 dose. 354 mL 0  ? Vitamin D, Ergocalciferol, (DRISDOL) 1.25 MG  (50000 UNIT) CAPS capsule Take 1 capsule (50,000 Units total) by mouth 2 (two) times a week. 24 capsule 1  ? ?No current facility-administered medications for this visit.  ? ?No Known Allergies ? ? ?Review of Systems: ?All systems reviewed and negative except where noted in HPI.  ? ?Physical Exam: ?BP 140/88   Pulse 84   Ht _0  (1.626 m)   Wt 211 lb 3.2 oz (95.8 kg)   SpO2 96%   BMI 36.25 kg/m?  ?Constitutional: Pleasant,well-developed, female in no acute distress. ?HEENT: Normocephalic and atraumatic. Conjunctivae are normal. No scleral icterus. ?Cardiovascular: Normal rate, regular rhythm.  ?Pulmonary/chest: Effort normal and breath sounds normal. No wheezing, rales or rhonchi. ?Abdominal: Soft, nondistended, nontender. Bowel sounds active throughout. There are no masses palpable. No hepatomegaly. ?Extremities: No edema ?Neurological: Alert and oriented to person place and time. ?Skin: Skin is warm and dry. No rashes noted. ?Psychiatric: Normal mood and affect. Behavior is normal. ? ?Labs 02/2022: Low ferritin of 3. Iron/TIBC with low serum iron of 11 and iron sat of 2%. Folate and vitamin B12 normal. CBC with low Hb of 7. POC fecal occult positive. ? ?ASSESSMENT AND PLAN: ?Iron deficiency anemia ?Irregular menstrual bleeding ?Positive hemoccult ?Patient presents with significant IDA.  She was noted to have a hemoglobin drop from 14.6  in 2012 to 6.6 a few days ago when checked with her PCP.  She received 2 units of packed red blood cells in the ED so we will recheck her CBC today.  At this time would be suspicious for GI etiology of blood loss.  We will plan for EGD and colonoscopy for further evaluation.  Patient is agreeable to this.  She also does describe some blood clots from irregular menstrual bleeding so we will refer her to OB/GYN for further evaluation for another potential source of her IDA ?- Check CBC now, which was found to be 9.5. Recheck in 2 weeks. ?- EGD/colonoscopy [facility] ?- Referral to  OB/GYN ? ?Christia Reading, MD ? ?

## 2022-02-27 NOTE — Patient Instructions (Signed)
If you are age 54 or younger, your body mass index should be between 19-25. Your Body mass index is 36.25 kg/m?[name] If this is out of the aformentioned range listed, please consider follow up with your Primary Care Provider.  ?________________________________________________________ ? ?The [facility] providers would like to encourage you to use [facility] to communicate with providers for non-urgent requests or questions.  Due to long hold times on the telephone, sending your provider a message by [facility] may be a faster and more efficient way to get a response.  Please allow 48 business hours for a response.  Please remember that this is for non-urgent requests.  ?_______________________________________________________ ? ?You have been scheduled for an endoscopy and colonoscopy. Please follow the written instructions given to you at your visit today. ?Please pick up your prep supplies at the pharmacy within the next 1-3 days. ?If you use inhalers (even only as needed), please bring them with you on the day of your procedure. ? ?Your provider has requested that you go to the basement level for lab work before leaving today. Press "B" on the elevator. The lab is located at the first door on the left as you exit the elevator. Please come by the lab in 2 weeks to check your hemoglobin again. ?

## 2022-02-28 ENCOUNTER — Ambulatory Visit
Admission: RE | Admit: 2022-02-28 | Discharge: 2022-02-28 | Disposition: A | Discharge status: Home / Self Care | Payer: BLUE CROSS/BLUE SHIELD | Source: Ambulatory Visit | Attending: Nurse Practitioner | Admitting: Nurse Practitioner

## 2022-02-28 DIAGNOSIS — Z1231 Encounter for screening mammogram for malignant neoplasm of breast: Secondary | ICD-10-CM | POA: Diagnosis not present

## 2022-03-07 ENCOUNTER — Other Ambulatory Visit (HOSPITAL_COMMUNITY): Payer: Self-pay

## 2022-03-07 ENCOUNTER — Other Ambulatory Visit: Payer: Self-pay | Admitting: Nurse Practitioner

## 2022-03-07 DIAGNOSIS — R928 Other abnormal and inconclusive findings on diagnostic imaging of breast: Secondary | ICD-10-CM

## 2022-03-14 ENCOUNTER — Other Ambulatory Visit (INDEPENDENT_AMBULATORY_CARE_PROVIDER_SITE_OTHER): Payer: BLUE CROSS/BLUE SHIELD

## 2022-03-14 DIAGNOSIS — D509 Iron deficiency anemia, unspecified: Secondary | ICD-10-CM

## 2022-03-14 DIAGNOSIS — N926 Irregular menstruation, unspecified: Secondary | ICD-10-CM

## 2022-03-14 LAB — CBC WITH DIFFERENTIAL/PLATELET
Basophils Absolute: 0 10*3/uL (ref 0.0–0.1)
Basophils Relative: 0.6 % (ref 0.0–3.0)
Eosinophils Absolute: 0.1 10*3/uL (ref 0.0–0.7)
Eosinophils Relative: 0.8 % (ref 0.0–5.0)
HCT: 34.1 % — ABNORMAL LOW (ref 36.0–46.0)
Hemoglobin: 10.6 g/dL — ABNORMAL LOW (ref 12.0–15.0)
Lymphocytes Relative: 25.8 % (ref 12.0–46.0)
Lymphs Abs: 1.7 10*3/uL (ref 0.7–4.0)
MCHC: 31 g/dL (ref 30.0–36.0)
MCV: 73.5 fl — ABNORMAL LOW (ref 78.0–100.0)
Monocytes Absolute: 0.5 10*3/uL (ref 0.1–1.0)
Monocytes Relative: 7.6 % (ref 3.0–12.0)
Neutro Abs: 4.4 10*3/uL (ref 1.4–7.7)
Neutrophils Relative %: 65.2 % (ref 43.0–77.0)
Platelets: 332 10*3/uL (ref 150.0–400.0)
RBC: 4.64 Mil/uL (ref 3.87–5.11)
RDW: 33.4 % — ABNORMAL HIGH (ref 11.5–15.5)
WBC: 6.7 10*3/uL (ref 4.0–10.5)

## 2022-03-19 ENCOUNTER — Other Ambulatory Visit: Payer: Self-pay | Admitting: Nurse Practitioner

## 2022-03-19 ENCOUNTER — Ambulatory Visit
Admission: RE | Admit: 2022-03-19 | Discharge: 2022-03-19 | Disposition: A | Discharge status: Home / Self Care | Payer: BLUE CROSS/BLUE SHIELD | Source: Ambulatory Visit | Attending: Nurse Practitioner | Admitting: Nurse Practitioner

## 2022-03-19 DIAGNOSIS — N6320 Unspecified lump in the left breast, unspecified quadrant: Secondary | ICD-10-CM

## 2022-03-19 DIAGNOSIS — R928 Other abnormal and inconclusive findings on diagnostic imaging of breast: Secondary | ICD-10-CM | POA: Diagnosis not present

## 2022-03-19 DIAGNOSIS — N6323 Unspecified lump in the left breast, lower outer quadrant: Secondary | ICD-10-CM | POA: Diagnosis not present

## 2022-03-19 DIAGNOSIS — N6324 Unspecified lump in the left breast, lower inner quadrant: Secondary | ICD-10-CM | POA: Diagnosis not present

## 2022-03-20 ENCOUNTER — Encounter: Payer: Self-pay | Admitting: Internal Medicine

## 2022-03-22 ENCOUNTER — Other Ambulatory Visit (HOSPITAL_COMMUNITY): Payer: Self-pay

## 2022-03-25 ENCOUNTER — Ambulatory Visit
Admission: RE | Admit: 2022-03-25 | Discharge: 2022-03-25 | Disposition: A | Discharge status: Home / Self Care | Payer: BLUE CROSS/BLUE SHIELD | Source: Ambulatory Visit | Attending: Nurse Practitioner | Admitting: Nurse Practitioner

## 2022-03-25 DIAGNOSIS — D242 Benign neoplasm of left breast: Secondary | ICD-10-CM | POA: Diagnosis not present

## 2022-03-25 DIAGNOSIS — N6325 Unspecified lump in the left breast, overlapping quadrants: Secondary | ICD-10-CM | POA: Diagnosis not present

## 2022-03-25 DIAGNOSIS — N6320 Unspecified lump in the left breast, unspecified quadrant: Secondary | ICD-10-CM

## 2022-03-27 ENCOUNTER — Ambulatory Visit (AMBULATORY_SURGERY_CENTER): Payer: BLUE CROSS/BLUE SHIELD | Admitting: Internal Medicine

## 2022-03-27 ENCOUNTER — Other Ambulatory Visit (HOSPITAL_COMMUNITY): Payer: Self-pay

## 2022-03-27 ENCOUNTER — Encounter: Payer: Self-pay | Admitting: Internal Medicine

## 2022-03-27 VITALS — BP 102/72 | HR 64 | Temp 98.0°F | Resp 10 | Ht 64.0 in | Wt 211.0 lb

## 2022-03-27 DIAGNOSIS — D123 Benign neoplasm of transverse colon: Secondary | ICD-10-CM

## 2022-03-27 DIAGNOSIS — B9681 Helicobacter pylori [H. pylori] as the cause of diseases classified elsewhere: Secondary | ICD-10-CM | POA: Diagnosis not present

## 2022-03-27 DIAGNOSIS — K259 Gastric ulcer, unspecified as acute or chronic, without hemorrhage or perforation: Secondary | ICD-10-CM

## 2022-03-27 DIAGNOSIS — K635 Polyp of colon: Secondary | ICD-10-CM

## 2022-03-27 DIAGNOSIS — K2970 Gastritis, unspecified, without bleeding: Secondary | ICD-10-CM

## 2022-03-27 DIAGNOSIS — D509 Iron deficiency anemia, unspecified: Secondary | ICD-10-CM

## 2022-03-27 DIAGNOSIS — K648 Other hemorrhoids: Secondary | ICD-10-CM

## 2022-03-27 DIAGNOSIS — K3189 Other diseases of stomach and duodenum: Secondary | ICD-10-CM | POA: Diagnosis not present

## 2022-03-27 DIAGNOSIS — K5140 Inflammatory polyps of colon without complications: Secondary | ICD-10-CM | POA: Diagnosis not present

## 2022-03-27 DIAGNOSIS — R195 Other fecal abnormalities: Secondary | ICD-10-CM

## 2022-03-27 DIAGNOSIS — K5730 Diverticulosis of large intestine without perforation or abscess without bleeding: Secondary | ICD-10-CM

## 2022-03-27 DIAGNOSIS — K2951 Unspecified chronic gastritis with bleeding: Secondary | ICD-10-CM | POA: Diagnosis not present

## 2022-03-27 DIAGNOSIS — D125 Benign neoplasm of sigmoid colon: Secondary | ICD-10-CM

## 2022-03-27 MED ORDER — SODIUM CHLORIDE 0.9 % IV SOLN: 500.0000 mL | Freq: Once | INTRAVENOUS | Status: DC

## 2022-03-27 MED ORDER — OMEPRAZOLE 40 MG PO CPDR
40.0000 mg | DELAYED_RELEASE_CAPSULE | Freq: Two times a day (BID) | ORAL | 0 refills | Status: DC
  Filled 2022-03-27: qty 112, 56d supply, fill #0

## 2022-03-27 NOTE — Op Note (Signed)
[facility] Patient Name: Kalaya Infantino Procedure Date: 03/27/2022 2:12 PM MRN: [id number] Endoscopist: Sonny Masters "Linda [name] ,  Age: 56 Referring MD:  Date of Birth: Nov 23, 1965 Gender: Female Account #: [id number] Procedure:                Upper GI endoscopy Indications:              Iron deficiency anemia Medicines:                Monitored Anesthesia Care Procedure:                After obtaining informed consent, the endoscope was                            passed under direct vision. Throughout the                            procedure, the patient's blood pressure, pulse, and                            oxygen saturations were monitored continuously. The                            GIF [id number] #[id number] was introduced through the                            mouth, and advanced to the second part of duodenum. Scope In: Scope Out: Findings:                 The examined esophagus was normal.                           Multiple localized erosions with no bleeding and no                            stigmata of recent bleeding were found in the                            gastric fundus. Biopsies were taken with a cold                            forceps for histology.                           The examined duodenum was normal. Biopsies were                            taken with a cold forceps for histology. Complications:            No immediate complications. Estimated Blood Loss:     Estimated blood loss was minimal. Impression:               - Normal esophagus.                           - Erosive gastropathy with no bleeding and no  stigmata of recent bleeding. Biopsied.                           - Normal examined duodenum. Biopsied. Recommendation:           - Await pathology results.                           - Use Prilosec (omeprazole) 40 mg PO BID for 8                            weeks.                           - Perform a colonoscopy today. Sonny Masters  "Linda [name],  03/27/2022 3:03:19 PM

## 2022-03-27 NOTE — Progress Notes (Signed)
GASTROENTEROLOGY PROCEDURE H&P NOTE   Primary Care Physician: [name], [name]    Reason for Procedure:   IDA  Plan:    EGD/colonoscopy  Patient is appropriate for endoscopic procedure(s) in the ambulatory ([place]) setting.  The nature of the procedure, as well as the risks, benefits, and alternatives were carefully and thoroughly reviewed with the patient. Ample time for discussion and questions allowed. The patient understood, was satisfied, and agreed to proceed.     HPI: Linda [name] is a 56 y.o. female who presents for EGD/colonoscopy for evaluation of IDA .  Patient was most recently seen in the Gastroenterology Clinic on 02/27/22.  No interval change in medical history since that appointment. Please refer to that note for full details regarding GI history and clinical presentation.   History reviewed. No pertinent past medical history.  Past Surgical History:  Procedure Laterality Date   CESAREAN SECTION     x4   TUBAL LIGATION      Prior to Admission medications   Medication Sig Start Date End Date Taking? Authorizing Provider  metFORMIN (GLUCOPHAGE) 500 MG tablet Take 1 tablet (500 mg total) by mouth 2 (two) times daily with a meal. 02/21/22  Yes [name], [name]  ferrous sulfate 325 (65 FE) MG tablet Take 1 tablet (325 mg total) by mouth daily. [birth year]   Delora Fuel, MD  Vitamin D, Ergocalciferol, (DRISDOL) 1.25 MG (50000 UNIT) CAPS capsule Take 1 capsule (50,000 Units total) by mouth 2 (two) times a week. 02/21/22   [name], [name]    Current Outpatient Medications  Medication Sig Dispense Refill   metFORMIN (GLUCOPHAGE) 500 MG tablet Take 1 tablet (500 mg total) by mouth 2 (two) times daily with a meal. 60 tablet 2   ferrous sulfate 325 (65 FE) MG tablet Take 1 tablet (325 mg total) by mouth daily. 30 tablet 0   Vitamin D, Ergocalciferol, (DRISDOL) 1.25 MG (50000 UNIT) CAPS capsule Take 1 capsule (50,000 Units total) by mouth 2 (two) times a week. 24  capsule 1   Current Facility-Administered Medications  Medication Dose Route Frequency Provider Last Rate Last Admin   0.9 %  sodium chloride infusion  500 mL Intravenous Once Sharyn Creamer, MD        Allergies as of 03/27/2022   (No Known Allergies)    Family History  Problem Relation Age of Onset   Memory loss Mother    Hypertension Father    Hypertension Brother    Diabetes Maternal Grandmother    Hypertension Maternal Grandmother    Hypertension Maternal Grandfather    Colon cancer Neg Hx    Stomach cancer Neg Hx    Esophageal cancer Neg Hx     Social History   Socioeconomic History   Marital status: Single    Spouse name: Not on file   Number of children: 4   Years of education: Not on file   Highest education level: Not on file  Occupational History    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  Tobacco Use   Smoking status: Never   Smokeless tobacco: Never  Vaping Use   Vaping Use: Never used  Substance and Sexual Activity   Alcohol use: No   Drug use: No   Sexual activity: Not Currently    Birth control/protection: Surgical  Other Topics Concern   Not on file  Social History Narrative   Not on file   Social Determinants of Health   Financial Resource Strain: Not  on file  Food Insecurity: Not on file  Transportation Needs: Not on file  Physical Activity: Not on file  Stress: Not on file  Social Connections: Not on file  Intimate Partner Violence: Not on file    Physical Exam: Vital signs in last 24 hours: BP 122/75   Pulse 79   Temp 98 F (36.7 C) (Temporal)   Resp 19   Ht '5\' 4"'$  (1.626 m)   Wt 211 lb (95.7 kg)   SpO2 97%   BMI 36.22 kg/m  GEN: NAD EYE: Sclerae anicteric ENT: MMM CV: Non-tachycardic Pulm: No increased WOB GI: Soft NEURO:  Alert & Oriented   Christia Reading, MD [facility]   03/27/2022 3:00 PM

## 2022-03-27 NOTE — Progress Notes (Signed)
VS completed by CW.   Breast biopsy done on Monday 03/25/2022 to left breast.   Pt's states no medical or surgical changes since previsit or office visit.

## 2022-03-27 NOTE — Op Note (Signed)
Linda [name] Patient Name: Linda [name] Procedure Date: 03/27/2022 2:11 PM MRN: [id number] Endoscopist: Sonny Masters "Linda [name] ,  Age: 56 Referring MD:  Date of Birth: Mar 02, 1966 Gender: Female Account #: [id number] Procedure:                Colonoscopy Indications:              Iron deficiency anemia Medicines:                Monitored Anesthesia Care Procedure:                Pre-Anesthesia Assessment:                           - Prior to the procedure, a History and Physical                            was performed, and patient medications and                            allergies were reviewed. The patient's tolerance of                            previous anesthesia was also reviewed. The risks                            and benefits of the procedure and the sedation                            options and risks were discussed with the patient.                            All questions were answered, and informed consent                            was obtained. Prior Anticoagulants: The patient has                            taken no previous anticoagulant or antiplatelet                            agents. ASA Grade Assessment: II - A patient with                            mild systemic disease. After reviewing the risks                            and benefits, the patient was deemed in                            satisfactory condition to undergo the procedure.                           After obtaining informed consent, the colonoscope  was passed under direct vision. Throughout the                            procedure, the patient's blood pressure, pulse, and                            oxygen saturations were monitored continuously. The                            PCF-HQ190L Colonoscope was introduced through the                            anus and advanced to the the terminal ileum. The                            colonoscopy was performed without  difficulty. The                            patient tolerated the procedure well. The quality                            of the bowel preparation was good. The terminal                            ileum, ileocecal valve, appendiceal orifice, and                            rectum were photographed. Scope In: 2:28:29 PM Scope Out: 2:13:08 PM Scope Withdrawal Time: 0 hours 14 minutes 51 seconds  Total Procedure Duration: 0 hours 17 minutes 50 seconds  Findings:                 The terminal ileum appeared normal.                           Two sessile polyps were found in the sigmoid colon                            and transverse colon. The polyps were 3 to 4 mm in                            size. These polyps were removed with a cold snare.                            Resection and retrieval were complete.                           Multiple diverticula were found in the sigmoid                            colon.                           Non-bleeding internal hemorrhoids were found during  retroflexion. Complications:            No immediate complications. Estimated Blood Loss:     Estimated blood loss was minimal. Impression:               - The examined portion of the ileum was normal.                           - Two 3 to 4 mm polyps in the sigmoid colon and in                            the transverse colon, removed with a cold snare.                            Resected and retrieved.                           - Diverticulosis in the sigmoid colon.                           - Non-bleeding internal hemorrhoids. Recommendation:           - Discharge patient to home (with escort).                           - Await pathology results.                           - The findings and recommendations were discussed                            with the patient. Sonny Masters "Linda [name],  03/27/2022 3:09:05 PM

## 2022-03-27 NOTE — Patient Instructions (Addendum)
Read all of the handouts given to you by your recovery room nurse.  Take your new medicine twice daily before meals.  YOU HAD AN ENDOSCOPIC PROCEDURE TODAY AT [facility]:   Refer to the procedure report that was given to you for any specific questions about what was found during the examination.  If the procedure report does not answer your questions, please call your gastroenterologist to clarify.  If you requested that your care partner not be given the details of your procedure findings, then the procedure report has been included in a sealed envelope for you to review at your convenience later.  YOU SHOULD EXPECT: Some feelings of bloating in the abdomen. Passage of more gas than usual.  Walking can help get rid of the air that was put into your GI tract during the procedure and reduce the bloating. If you had a lower endoscopy (such as a colonoscopy or flexible sigmoidoscopy) you may notice spotting of blood in your stool or on the toilet paper. If you underwent a bowel prep for your procedure, you may not have a normal bowel movement for a few days.  Please Note:  You might notice some irritation and congestion in your nose or some drainage.  This is from the oxygen used during your procedure.  There is no need for concern and it should clear up in a day or so.  SYMPTOMS TO REPORT IMMEDIATELY:  Following lower endoscopy (colonoscopy or flexible sigmoidoscopy):  Excessive amounts of blood in the stool  Significant tenderness or worsening of abdominal pains  Swelling of the abdomen that is new, acute  Fever of 100F or higher  Following upper endoscopy (EGD)  Vomiting of blood or coffee ground material  New chest pain or pain under the shoulder blades  Painful or persistently difficult swallowing  New shortness of breath  Fever of 100F or higher  Black, tarry-looking stools  For urgent or emergent issues, a gastroenterologist can be reached at any hour by calling [phone]  [phone]. Do not use MyChart messaging for urgent concerns.    DIET:  We do recommend a small meal at first, but then you may proceed to your regular diet.  Drink plenty of fluids but you should avoid alcoholic beverages for 24 hours.  ACTIVITY:  You should plan to take it easy for the rest of today and you should NOT DRIVE or use heavy machinery until tomorrow (because of the sedation medicines used during the test).    FOLLOW UP: Our staff will call the number listed on your records 24-72 hours following your procedure to check on you and address any questions or concerns that you may have regarding the information given to you following your procedure. If we do not reach you, we will leave a message.  We will attempt to reach you two times.  During this call, we will ask if you have developed any symptoms of COVID 19. If you develop any symptoms (ie: fever, flu-like symptoms, shortness of breath, cough etc.) before then, please call [phone].  If you test positive for Covid 19 in the 2 weeks post procedure, please call and report this information to [place].    If any biopsies were taken you will be contacted by phone or by letter within the next 1-3 weeks.  Please call us at [phone] if you have not heard about the biopsies in 3 weeks.    SIGNATURES/CONFIDENTIALITY: You and/or your care partner have signed paperwork which  will be entered into your electronic medical record.  These signatures attest to the fact that that the information above on your After Visit Summary has been reviewed and is understood.  Full responsibility of the confidentiality of this discharge information lies with you and/or your care-partner.

## 2022-03-27 NOTE — Progress Notes (Signed)
Report to PACU, RN, vss, BBS= Clear.  

## 2022-03-27 NOTE — Progress Notes (Signed)
Called to room to assist during endoscopic procedure.  Patient ID and intended procedure confirmed with present staff. Received instructions for my participation in the procedure from the performing physician.  

## 2022-03-28 ENCOUNTER — Telehealth: Payer: Self-pay | Admitting: *Deleted

## 2022-03-28 NOTE — Telephone Encounter (Signed)
  Follow up Call-     03/27/2022    1:52 PM  Call back number  Post procedure Call Back phone  # [phone]  Permission to leave phone message Yes     Patient questions:  Do you have a fever, pain , or abdominal swelling? No. Pain Score  0 *  Have you tolerated food without any problems? Yes.    Have you been able to return to your normal activities? Yes.    Do you have any questions about your discharge instructions: Diet   No. Medications  No. Follow up visit  No.  Do you have questions or concerns about your Care? No.  Actions: * If pain score is 4 or above: No action needed, pain <4.

## 2022-04-02 ENCOUNTER — Other Ambulatory Visit: Payer: Self-pay

## 2022-04-02 ENCOUNTER — Other Ambulatory Visit (HOSPITAL_COMMUNITY): Payer: Self-pay

## 2022-04-02 ENCOUNTER — Encounter: Payer: Self-pay | Admitting: Internal Medicine

## 2022-04-02 MED ORDER — METRONIDAZOLE 250 MG PO TABS
250.0000 mg | ORAL_TABLET | Freq: Four times a day (QID) | ORAL | 0 refills | Status: AC
  Filled 2022-04-02: qty 56, 14d supply, fill #0

## 2022-04-02 MED ORDER — BISMUTH SUBSALICYLATE 262 MG PO TABS
2.0000 | ORAL_TABLET | Freq: Four times a day (QID) | ORAL | 0 refills | Status: AC
  Filled 2022-04-02: qty 112, 14d supply, fill #0

## 2022-04-02 MED ORDER — TETRACYCLINE HCL 500 MG PO CAPS
500.0000 mg | ORAL_CAPSULE | Freq: Four times a day (QID) | ORAL | 0 refills | Status: AC
  Filled 2022-04-02: qty 56, 14d supply, fill #0

## 2022-04-03 ENCOUNTER — Other Ambulatory Visit (HOSPITAL_COMMUNITY): Payer: Self-pay

## 2022-04-10 ENCOUNTER — Ambulatory Visit: Payer: BLUE CROSS/BLUE SHIELD | Admitting: Obstetrics and Gynecology

## 2022-04-10 ENCOUNTER — Other Ambulatory Visit (HOSPITAL_COMMUNITY)
Admission: RE | Admit: 2022-04-10 | Discharge: 2022-04-10 | Disposition: A | Discharge status: Home / Self Care | Payer: BLUE CROSS/BLUE SHIELD | Source: Ambulatory Visit | Attending: Obstetrics and Gynecology | Admitting: Obstetrics and Gynecology

## 2022-04-10 ENCOUNTER — Encounter: Payer: Self-pay | Admitting: Obstetrics and Gynecology

## 2022-04-10 DIAGNOSIS — Z202 Contact with and (suspected) exposure to infections with a predominantly sexual mode of transmission: Secondary | ICD-10-CM | POA: Insufficient documentation

## 2022-04-10 DIAGNOSIS — Z01419 Encounter for gynecological examination (general) (routine) without abnormal findings: Secondary | ICD-10-CM

## 2022-04-10 DIAGNOSIS — N939 Abnormal uterine and vaginal bleeding, unspecified: Secondary | ICD-10-CM | POA: Insufficient documentation

## 2022-04-10 DIAGNOSIS — N938 Other specified abnormal uterine and vaginal bleeding: Secondary | ICD-10-CM | POA: Diagnosis not present

## 2022-04-10 HISTORY — DX: Abnormal uterine and vaginal bleeding, unspecified: N93.9

## 2022-04-10 NOTE — Progress Notes (Signed)
Linda [name] is a 56 y.o. G70P1004 female here for a routine annual gynecologic exam.  Current complaints: DUB. Reports irregular cycles now for several yrs. Cycles have more clots then previously. Not sexual active. Denies any menopausal Sx.   Gynecologic History Patient's last menstrual period was 12/30/2021. Contraception: tubal ligation Last Pap: 2019. Results were: normal Last mammogram: 5/23. Results were: abnormal  Obstetric History OB History  Gravida Para Term Preterm AB Living  '4 1 1     4  '$ SAB IAB Ectopic Multiple Live Births          4    # Outcome Date GA Lbr Len/2nd Weight Sex Delivery Anes PTL Lv  4 Gravida      CS-LTranv   LIV  3 Gravida      CS-LTranv   LIV  2 Gravida      CS-LTranv   LIV  1 Term      CS-LTranv   LIV    Past Medical History:  Diagnosis Date   Pre-diabetes 02/2022    Past Surgical History:  Procedure Laterality Date   CESAREAN SECTION     x4   TUBAL LIGATION      Current Outpatient Medications on File Prior to Visit  Medication Sig Dispense Refill   Bismuth Subsalicylate 416 MG TABS Take 2 tablets (524 mg total) by mouth in the morning, at noon, in the evening, and at bedtime for 14 days. 112 tablet 0   metFORMIN (GLUCOPHAGE) 500 MG tablet Take 1 tablet (500 mg total) by mouth 2 (two) times daily with a meal. 60 tablet 2   metroNIDAZOLE (FLAGYL) 250 MG tablet Take 1 tablet (250 mg total) by mouth 4 (four) times daily for 14 days. 56 tablet 0   omeprazole (PRILOSEC) 40 MG capsule Take 1 capsule (40 mg total) by mouth 2 (two) times daily before a meal. 112 capsule 0   tetracycline (SUMYCIN) 500 MG capsule Take 1 capsule (500 mg total) by mouth 4 (four) times daily for 14 days. 56 capsule 0   Vitamin D, Ergocalciferol, (DRISDOL) 1.25 MG (50000 UNIT) CAPS capsule Take 1 capsule (50,000 Units total) by mouth 2 (two) times a week. 24 capsule 1   ferrous sulfate 325 (65 FE) MG tablet Take 1 tablet (325 mg total) by mouth daily. (Patient not  taking: Reported on 04/10/2022) 30 tablet 0   No current facility-administered medications on file prior to visit.    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  Tobacco Use   Smoking status: Never   Smokeless tobacco: Never  Vaping Use   Vaping Use: Never used  Substance and Sexual Activity   Alcohol use: No   Drug use: No   Sexual activity: Not Currently    Birth control/protection: Surgical  Other Topics Concern   Not on file  Social History Narrative   Not on file   Social Determinants of Health   Financial Resource Strain: Not on file  Food Insecurity: Not on file  Transportation Needs: Not on file  Physical Activity: Not on file  Stress: Not on file  Social Connections: Not on file  Intimate Partner Violence: Not on file    Family History  Problem Relation Age of Onset   Memory loss Mother    Hypertension Father    Hypertension Brother    Diabetes Maternal Grandmother    Hypertension Maternal Grandmother    Hypertension Maternal Grandfather    Colon cancer Neg Hx    Stomach cancer Neg Hx    Esophageal cancer Neg Hx     The following portions of the patient's history were reviewed and updated as appropriate: allergies, current medications, past family history, past medical history, past social history, past surgical history and problem list.  Review of Systems Pertinent items noted in HPI and remainder of comprehensive ROS otherwise negative.   Objective:  BP (!) 185/82   Pulse 79   Ht '5\' 4"'$  (1.626 m)   Wt 212 lb (96.2 kg)   LMP 12/30/2021   BMI 36.39 kg/m  CONSTITUTIONAL: Well-developed, well-nourished female in no acute distress.  HENT:  Normocephalic, atraumatic, External right and left ear normal.  Oropharynx is clear and moist EYES: Conjunctivae and EOM are normal. Pupils are equal, round, and reactive to light. No scleral icterus.  NECK: Normal range of motion, supple, no masses.  Normal thyroid.  SKIN: Skin is warm and dry. No rash noted. Not diaphoretic. No erythema. No pallor. [place]: Alert and oriented to person, place, and time. Normal reflexes, muscle tone coordination. No cranial nerve deficit noted. PSYCHIATRIC: Normal mood and affect. Normal behavior. Normal judgment and thought content. CARDIOVASCULAR: Normal heart rate noted, regular rhythm RESPIRATORY: Clear to auscultation bilaterally. Effort and breath sounds normal, no problems with respiration noted. BREASTS: Symmetric in size. No skin changes, nipple drainage, or lymphadenopathy. ABDOMEN: Soft, normal bowel sounds, no distention noted.  No tenderness, rebound or guarding.  PELVIC: Normal appearing external genitalia; normal appearing vaginal mucosa and cervix.  No abnormal discharge noted.  Pap smear obtained.  Normal uterine size, no other palpable masses, no uterine or adnexal tenderness. Uterine mobility limited MUSCULOSKELETAL: Normal range of motion. No tenderness.  No cyanosis, clubbing, or edema.  2+ distal pulses.   Assessment:  Annual gynecologic examination with pap smear DUB STD exposure Plan:  Will follow up results of pap smear and manage accordingly. STD testing as per pt request DUB reviewed with pt. Will check GYN U/S. F/U in 3-4 weeks to review GYN U/S. Routine preventative health maintenance measures emphasized. Please refer to After Visit Summary for other counseling recommendations.    Chancy Milroy, MD, [place] Attending [place] for [facility], [place]

## 2022-04-10 NOTE — Progress Notes (Signed)
New Patient is in the office to discuss cycles Pt reports last pap unknown Last mammogram 03/19/2022 Pt reports menstrual cycles that consist of just clots and no bleeding in between LMP 12/30/21 Hx of BTL

## 2022-04-10 NOTE — Patient Instructions (Signed)

## 2022-04-11 LAB — HIV ANTIBODY (ROUTINE TESTING W REFLEX): HIV Screen 4th Generation wRfx: NONREACTIVE

## 2022-04-11 LAB — CERVICOVAGINAL ANCILLARY ONLY
Bacterial Vaginitis (gardnerella): POSITIVE — AB
Candida Glabrata: NEGATIVE
Candida Vaginitis: NEGATIVE
Chlamydia: NEGATIVE
Comment: NEGATIVE
Comment: NEGATIVE
Comment: NEGATIVE
Comment: NEGATIVE
Comment: NEGATIVE
Comment: NORMAL
Neisseria Gonorrhea: NEGATIVE
Trichomonas: NEGATIVE

## 2022-04-11 LAB — RPR: RPR Ser Ql: NONREACTIVE

## 2022-04-11 LAB — HEPATITIS B SURFACE ANTIGEN: Hepatitis B Surface Ag: NEGATIVE

## 2022-04-11 LAB — HEPATITIS C ANTIBODY: Hep C Virus Ab: NONREACTIVE

## 2022-04-12 ENCOUNTER — Other Ambulatory Visit (HOSPITAL_COMMUNITY): Payer: Self-pay

## 2022-04-12 ENCOUNTER — Other Ambulatory Visit: Payer: Self-pay | Admitting: Emergency Medicine

## 2022-04-12 DIAGNOSIS — B9689 Other specified bacterial agents as the cause of diseases classified elsewhere: Secondary | ICD-10-CM

## 2022-04-12 MED ORDER — METRONIDAZOLE 500 MG PO TABS
500.0000 mg | ORAL_TABLET | Freq: Two times a day (BID) | ORAL | 0 refills | Status: DC
  Filled 2022-04-12: qty 14, 7d supply, fill #0

## 2022-04-12 NOTE — Progress Notes (Signed)
Rx for BV sent to pharmacy.  

## 2022-04-15 ENCOUNTER — Ambulatory Visit: Payer: Self-pay | Admitting: General Surgery

## 2022-04-15 ENCOUNTER — Other Ambulatory Visit (HOSPITAL_COMMUNITY): Payer: BLUE CROSS/BLUE SHIELD

## 2022-04-15 DIAGNOSIS — D242 Benign neoplasm of left breast: Secondary | ICD-10-CM

## 2022-04-16 LAB — CYTOLOGY - PAP
Comment: NEGATIVE
Comment: NEGATIVE
Comment: NEGATIVE
Diagnosis: NEGATIVE
HPV 16: NEGATIVE
HPV 18 / 45: NEGATIVE
High risk HPV: POSITIVE — AB

## 2022-04-17 ENCOUNTER — Encounter: Payer: Self-pay | Admitting: Obstetrics and Gynecology

## 2022-04-18 ENCOUNTER — Ambulatory Visit (HOSPITAL_COMMUNITY)
Admission: RE | Admit: 2022-04-18 | Discharge: 2022-04-18 | Disposition: A | Discharge status: Home / Self Care | Payer: BLUE CROSS/BLUE SHIELD | Source: Ambulatory Visit | Attending: Obstetrics and Gynecology | Admitting: Obstetrics and Gynecology

## 2022-04-18 ENCOUNTER — Telehealth: Payer: Self-pay

## 2022-04-18 DIAGNOSIS — N938 Other specified abnormal uterine and vaginal bleeding: Secondary | ICD-10-CM | POA: Diagnosis not present

## 2022-04-18 DIAGNOSIS — N950 Postmenopausal bleeding: Secondary | ICD-10-CM | POA: Diagnosis not present

## 2022-04-18 NOTE — Telephone Encounter (Signed)
Called the patient. No answer. Left a message asking she call back. Reminder call to stop omeprazole for 2 weeks. Then she is to go to the lab to get the stool collection container to be retested for H Pylori.

## 2022-04-19 ENCOUNTER — Other Ambulatory Visit: Payer: Self-pay

## 2022-04-19 DIAGNOSIS — A048 Other specified bacterial intestinal infections: Secondary | ICD-10-CM

## 2022-04-19 NOTE — Telephone Encounter (Signed)
Patient returned your call, advised patient on what she needed to do. Patient stated she understood.

## 2022-04-29 ENCOUNTER — Ambulatory Visit: Payer: Self-pay | Admitting: Nurse Practitioner

## 2022-05-10 ENCOUNTER — Other Ambulatory Visit: Payer: Self-pay | Admitting: General Surgery

## 2022-05-10 DIAGNOSIS — D242 Benign neoplasm of left breast: Secondary | ICD-10-CM

## 2022-05-16 ENCOUNTER — Ambulatory Visit (INDEPENDENT_AMBULATORY_CARE_PROVIDER_SITE_OTHER): Payer: BLUE CROSS/BLUE SHIELD | Admitting: Nurse Practitioner

## 2022-05-16 ENCOUNTER — Encounter: Payer: Self-pay | Admitting: Nurse Practitioner

## 2022-05-16 ENCOUNTER — Other Ambulatory Visit (HOSPITAL_COMMUNITY): Payer: Self-pay

## 2022-05-16 VITALS — BP 160/80 | HR 85 | Temp 98.3°F | Ht 64.0 in | Wt 209.8 lb

## 2022-05-16 DIAGNOSIS — D500 Iron deficiency anemia secondary to blood loss (chronic): Secondary | ICD-10-CM

## 2022-05-16 DIAGNOSIS — Z0000 Encounter for general adult medical examination without abnormal findings: Secondary | ICD-10-CM | POA: Diagnosis not present

## 2022-05-16 DIAGNOSIS — I10 Essential (primary) hypertension: Secondary | ICD-10-CM | POA: Diagnosis not present

## 2022-05-16 DIAGNOSIS — Z6836 Body mass index (BMI) 36.0-36.9, adult: Secondary | ICD-10-CM | POA: Diagnosis not present

## 2022-05-16 DIAGNOSIS — E6609 Other obesity due to excess calories: Secondary | ICD-10-CM | POA: Diagnosis not present

## 2022-05-16 LAB — POCT URINALYSIS DIPSTICK
Bilirubin, UA: NEGATIVE
Glucose, UA: NEGATIVE
Ketones, UA: NEGATIVE
Leukocytes, UA: NEGATIVE
Nitrite, UA: NEGATIVE
Protein, UA: NEGATIVE
Spec Grav, UA: 1.025 (ref 1.010–1.025)
Urobilinogen, UA: 0.2 E.U./dL
pH, UA: 6 (ref 5.0–8.0)

## 2022-05-16 MED ORDER — VALSARTAN 160 MG PO TABS
160.0000 mg | ORAL_TABLET | Freq: Every day | ORAL | 2 refills | Status: DC
  Filled 2022-05-16: qty 30, 30d supply, fill #0
  Filled 2022-06-26: qty 30, 30d supply, fill #1

## 2022-05-16 MED ORDER — SEMAGLUTIDE-WEIGHT MANAGEMENT 1.7 MG/0.75ML ~~LOC~~ SOAJ
1.7000 mg | SUBCUTANEOUS | 0 refills | Status: DC
  Filled 2022-05-16: qty 3, 28d supply, fill #0

## 2022-05-16 MED ORDER — SEMAGLUTIDE-WEIGHT MANAGEMENT 0.5 MG/0.5ML ~~LOC~~ SOAJ
0.5000 mg | SUBCUTANEOUS | 0 refills | Status: DC
  Filled 2022-05-16 – 2022-06-26 (×2): qty 2, 28d supply, fill #0

## 2022-05-16 MED ORDER — SEMAGLUTIDE-WEIGHT MANAGEMENT 0.25 MG/0.5ML ~~LOC~~ SOAJ
0.2500 mg | SUBCUTANEOUS | 0 refills | Status: AC
  Filled 2022-05-16: qty 2, 28d supply, fill #0

## 2022-05-16 MED ORDER — SEMAGLUTIDE-WEIGHT MANAGEMENT 2.4 MG/0.75ML ~~LOC~~ SOAJ
2.4000 mg | SUBCUTANEOUS | 1 refills | Status: DC
  Filled 2022-05-16: qty 3, 28d supply, fill #0

## 2022-05-16 MED ORDER — SEMAGLUTIDE-WEIGHT MANAGEMENT 1 MG/0.5ML ~~LOC~~ SOAJ
1.0000 mg | SUBCUTANEOUS | 0 refills | Status: DC
  Filled 2022-05-16: qty 2, 28d supply, fill #0

## 2022-05-16 NOTE — Patient Instructions (Signed)

## 2022-05-16 NOTE — Progress Notes (Signed)
Barnet Glasgow Martin,acting as a Education administrator for [name], [name].,have documented all relevant documentation on the behalf of [name], [name],as directed by  [name], [name] while in the presence of [name], [place].   Subjective:     Patient ID: Linda [name] , female    DOB: [birth year] , 56 y.o.   MRN: [id number]   Chief Complaint  Patient presents with   Annual Exam    HPI  Patient presents today for HM. Patient doesn't have any other concerns. Patient goes to [employer] for [employer]. BP Readings from Last 3 Encounters: 05/16/22 : (!) 150/86 04/10/22 : (!) 185/82 03/27/22 : 102/72   She feels she was taking phentermine in the past.      Past Medical History:  Diagnosis Date   Anemia    Hypertension    Pre-diabetes 02/2022     Family History  Problem Relation Age of Onset   Memory loss Mother    Hypertension Father    Hypertension Brother    Diabetes Maternal Grandmother    Hypertension Maternal Grandmother    Hypertension Maternal Grandfather    Colon cancer Neg Hx    Stomach cancer Neg Hx    Esophageal cancer Neg Hx      Current Outpatient Medications:    ferrous sulfate 325 (65 FE) MG tablet, Take 1 tablet (325 mg total) by mouth daily., Disp: 30 tablet, Rfl: 0   omeprazole (PRILOSEC) 40 MG capsule, Take 1 capsule (40 mg total) by mouth 2 (two) times daily before a meal., Disp: 112 capsule, Rfl: 0   Semaglutide-Weight Management 0.25 MG/0.5ML SOAJ, Inject 0.25 mg into the skin once a week for 28 days., Disp: 2 mL, Rfl: 0   [START ON 06/14/2022] Semaglutide-Weight Management 0.5 MG/0.5ML SOAJ, Inject 0.5 mg into the skin once a week for 28 days., Disp: 2 mL, Rfl: 0   [START ON 07/13/2022] Semaglutide-Weight Management 1 MG/0.5ML SOAJ, Inject 1 mg into the skin once a week for 28 days., Disp: 2 mL, Rfl: 0   [START ON 08/11/2022] Semaglutide-Weight Management 1.7 MG/0.75ML SOAJ, Inject 1.7 mg into the skin once a week for 28 days., Disp: 3 mL, Rfl: 0   [START ON  09/09/2022] Semaglutide-Weight Management 2.4 MG/0.75ML SOAJ, Inject 2.4 mg into the skin once a week., Disp: 3 mL, Rfl: 1   valsartan (DIOVAN) 160 MG tablet, Take 1 tablet (160 mg total) by mouth daily., Disp: 30 tablet, Rfl: 2   Vitamin D, Ergocalciferol, (DRISDOL) 1.25 MG (50000 UNIT) CAPS capsule, Take 1 capsule (50,000 Units total) by mouth 2 (two) times a week., Disp: 24 capsule, Rfl: 1   metFORMIN (GLUCOPHAGE) 500 MG tablet, Take 1 tablet (500 mg total) by mouth 2 (two) times daily with a meal., Disp: 180 tablet, Rfl: 1   No Known Allergies    The patient states she uses tubal ligation for birth control.  Patient's last menstrual period was 12/30/2021 (approximate).  Negative for Dysmenorrhea and Negative for Menorrhagia. Negative for: breast discharge, breast lump(s), breast pain and breast self exam. Associated symptoms include abnormal vaginal bleeding. Pertinent negatives include abnormal bleeding (hematology), anxiety, decreased libido, depression, difficulty falling sleep, dyspareunia, history of infertility, nocturia, sexual dysfunction, sleep disturbances, urinary incontinence, urinary urgency, vaginal discharge and vaginal itching. Diet regular. The patient states her exercise level is none.   The patient's tobacco use is:  Social History   Tobacco Use  Smoking Status Never  Smokeless Tobacco Never   She has been  exposed to passive smoke. The patient's alcohol use is:  Social History   Substance and Sexual Activity  Alcohol Use No   Additional information: Last pap 04/10/2022, next one scheduled for 04/10/2025.    Review of Systems  Constitutional: Negative.   HENT: Negative.    Eyes: Negative.   Respiratory: Negative.    Cardiovascular: Negative.   Gastrointestinal: Negative.   Endocrine: Negative.   Genitourinary: Negative.   Musculoskeletal: Negative.   Skin: Negative.   Allergic/Immunologic: Negative.   Neurological: Negative.   Hematological: Negative.    Psychiatric/Behavioral: Negative.       Today's Vitals   05/16/22 0933 05/16/22 1053  BP: (!) 150/86 (!) 160/80  Pulse: 85   Temp: 98.3 F (36.8 C)   TempSrc: Oral   Weight: 209 lb 12.8 oz (95.2 kg)   Height: $Remove'5\' 4"'PlmiDjU$  (1.626 m)   PainSc: 0-No pain    Body mass index is 36.01 kg/m.  Wt Readings from Last 3 Encounters:  05/21/22 214 lb (97.1 kg)  05/16/22 209 lb 12.8 oz (95.2 kg)  04/10/22 212 lb (96.2 kg)     Objective:  Physical Exam Vitals reviewed.  Constitutional:      General: She is not in acute distress.    Appearance: Normal appearance. She is well-developed. She is obese.  HENT:     Head: Normocephalic and atraumatic.     Right Ear: Hearing, tympanic membrane, ear canal and external ear normal. There is no impacted cerumen.     Left Ear: Hearing, tympanic membrane, ear canal and external ear normal. There is no impacted cerumen.     Nose:     Comments: Deferred - masked    Mouth/Throat:     Comments: Deferred - masked Eyes:     General: Lids are normal.     Extraocular Movements: Extraocular movements intact.     Conjunctiva/sclera: Conjunctivae normal.     Pupils: Pupils are equal, round, and reactive to light.     Funduscopic exam:    Right eye: No papilledema.        Left eye: No papilledema.  Neck:     Thyroid: No thyroid mass.     Vascular: No carotid bruit.  Cardiovascular:     Rate and Rhythm: Normal rate and regular rhythm.     Pulses: Normal pulses.     Heart sounds: Normal heart sounds. No murmur heard. Pulmonary:     Effort: Pulmonary effort is normal. No respiratory distress.     Breath sounds: Normal breath sounds. No wheezing.  Chest:     Chest wall: No mass.  Breasts:    Tanner Score is 5.     Right: Normal. No mass or tenderness.     Left: Normal. No mass or tenderness.  Abdominal:     General: Abdomen is flat. Bowel sounds are normal. There is no distension.     Palpations: Abdomen is soft.     Tenderness: There is no abdominal  tenderness.  Genitourinary:    Rectum: Guaiac result negative.  Musculoskeletal:        General: No swelling. Normal range of motion.     Cervical back: Full passive range of motion without pain, normal range of motion and neck supple.     Right lower leg: No edema.     Left lower leg: No edema.  Lymphadenopathy:     Upper Body:     Right upper body: No supraclavicular, axillary or pectoral adenopathy.  Left upper body: No supraclavicular, axillary or pectoral adenopathy.  Skin:    General: Skin is warm and dry.     Capillary Refill: Capillary refill takes less than 2 seconds.  Neurological:     General: No focal deficit present.     Mental Status: She is alert and oriented to person, place, and time.     Cranial Nerves: No cranial nerve deficit.     Sensory: No sensory deficit.     Motor: No weakness.  Psychiatric:        Mood and Affect: Mood normal.        Behavior: Behavior normal.        Thought Content: Thought content normal.        Judgment: Judgment normal.         Assessment And Plan:     1. Annual physical exam Behavior modifications discussed and diet history reviewed.   Pt will continue to exercise regularly and modify diet with low GI, plant based foods and decrease intake of processed foods.  Recommend intake of daily multivitamin, Vitamin D, and calcium.  Recommend mammogram and colonoscopy for preventive screenings, as well as recommend immunizations that include influenza, TDAP, and Shingles (declined) - Microalbumin / Creatinine Urine Ratio - POCT Urinalysis Dipstick (81002)  2. Class 2 obesity due to excess calories without serious comorbidity with body mass index (BMI) of 36.0 to 36.9 in adult Comments: Will check for approval of Wegov[employer] once approved she is to call office for teaching. we have started [facility], discussed side effects to include nausea, difficulty swallowing and abdominal pain. She is to titrate weekly as tolerated. Goal to lose 10% body  weight in 4 months if approved by insurance  - CMP14+EGFR - Hemoglobin A1c - Lipid panel - Semaglutide-Weight Management 0.25 MG/0.5ML SOAJ; Inject 0.25 mg into the skin once a week for 28 days.  Dispense: 2 mL; Refill: 0 - Semaglutide-Weight Management 0.5 MG/0.5ML SOAJ; Inject 0.5 mg into the skin once a week for 28 days.  Dispense: 2 mL; Refill: 0 - Semaglutide-Weight Management 1 MG/0.5ML SOAJ; Inject 1 mg into the skin once a week for 28 days.  Dispense: 2 mL; Refill: 0 - Semaglutide-Weight Management 1.7 MG/0.75ML SOAJ; Inject 1.7 mg into the skin once a week for 28 days.  Dispense: 3 mL; Refill: 0 - Semaglutide-Weight Management 2.4 MG/0.75ML SOAJ; Inject 2.4 mg into the skin once a week.  Dispense: 3 mL; Refill: 1  3. Iron deficiency anemia due to chronic blood loss Comments: Will recheck iron studies - Iron, TIBC and Ferritin Panel  4. Essential hypertension Comments: EKG done with Nonspecific t abnormality HR 74, blood pressure is not well controlled. Repeat is more elevated, will start on valsartan and focus on weight loss - EKG 12-Lead - CBC no Diff - Microalbumin / Creatinine Urine Ratio - POCT Urinalysis Dipstick (81002) - valsartan (DIOVAN) 160 MG tablet; Take 1 tablet (160 mg total) by mouth daily.  Dispense: 30 tablet; Refill: 2     Patient was given opportunity to ask questions. Patient verbalized understanding of the plan and was able to repeat key elements of the plan. All questions were answered to their satisfaction.   [name], [name]  I, [name], [name], have reviewed all documentation for this visit. The documentation on 05/16/22 for the exam, diagnosis, procedures, and orders are all accurate and complete.    THE PATIENT IS ENCOURAGED TO PRACTICE SOCIAL DISTANCING DUE TO THE COVID-19 PANDEMIC.

## 2022-05-17 ENCOUNTER — Other Ambulatory Visit: Payer: Self-pay | Admitting: Nurse Practitioner

## 2022-05-17 ENCOUNTER — Other Ambulatory Visit (HOSPITAL_COMMUNITY): Payer: Self-pay

## 2022-05-17 LAB — CMP14+EGFR
ALT: 13 IU/L (ref 0–32)
AST: 17 IU/L (ref 0–40)
Albumin/Globulin Ratio: 1.3 (ref 1.2–2.2)
Albumin: 4.4 g/dL (ref 3.8–4.9)
Alkaline Phosphatase: 94 IU/L (ref 44–121)
BUN/Creatinine Ratio: 11 (ref 9–23)
BUN: 9 mg/dL (ref 6–24)
Bilirubin Total: <0.2 mg/dL (ref 0.0–1.2)
CO2: 21 mmol/L (ref 20–29)
Calcium: 9.3 mg/dL (ref 8.7–10.2)
Chloride: 104 mmol/L (ref 96–106)
Creatinine, Ser: 0.8 mg/dL (ref 0.57–1.00)
Globulin, Total: 3.4 g/dL (ref 1.5–4.5)
Glucose: 77 mg/dL (ref 70–99)
Potassium: 4 mmol/L (ref 3.5–5.2)
Sodium: 141 mmol/L (ref 134–144)
Total Protein: 7.8 g/dL (ref 6.0–8.5)
eGFR: 88 mL/min/{1.73_m2} (ref >59)

## 2022-05-17 LAB — LIPID PANEL
Chol/HDL Ratio: 4.9 ratio — ABNORMAL HIGH (ref 0.0–4.4)
Cholesterol, Total: 234 mg/dL — ABNORMAL HIGH (ref 100–199)
HDL: 48 mg/dL (ref >39)
LDL Chol Calc (NIH): 164 mg/dL — ABNORMAL HIGH (ref 0–99)
Triglycerides: 122 mg/dL (ref 0–149)
VLDL Cholesterol Cal: 22 mg/dL (ref 5–40)

## 2022-05-17 LAB — IRON,TIBC AND FERRITIN PANEL
Ferritin: 7 ng/mL — ABNORMAL LOW (ref 15–150)
Iron Saturation: 46 % (ref 15–55)
Iron: 182 ug/dL — ABNORMAL HIGH (ref 27–159)
Total Iron Binding Capacity: 397 ug/dL (ref 250–450)
UIBC: 215 ug/dL (ref 131–425)

## 2022-05-17 LAB — MICROALBUMIN / CREATININE URINE RATIO
Creatinine, Urine: 116.2 mg/dL
Microalb/Creat Ratio: 27 mg/g{creat} (ref 0–29)
Microalbumin, Urine: 31.4 ug/mL

## 2022-05-17 LAB — CBC
Hematocrit: 33.9 % — ABNORMAL LOW (ref 34.0–46.6)
Hemoglobin: 10.3 g/dL — ABNORMAL LOW (ref 11.1–15.9)
MCH: 24.3 pg — ABNORMAL LOW (ref 26.6–33.0)
MCHC: 30.4 g/dL — ABNORMAL LOW (ref 31.5–35.7)
MCV: 80 fL (ref 79–97)
Platelets: 364 10*3/uL (ref 150–450)
RBC: 4.24 x10E6/uL (ref 3.77–5.28)
RDW: 19.4 % — ABNORMAL HIGH (ref 11.7–15.4)
WBC: 6 10*3/uL (ref 3.4–10.8)

## 2022-05-17 LAB — HEMOGLOBIN A1C
Est. average glucose Bld gHb Est-mCnc: 117 mg/dL
Hgb A1c MFr Bld: 5.7 % — ABNORMAL HIGH (ref 4.8–5.6)

## 2022-05-17 MED ORDER — METFORMIN HCL 500 MG PO TABS
500.0000 mg | ORAL_TABLET | Freq: Two times a day (BID) | ORAL | 1 refills | Status: DC
  Filled 2022-05-17: qty 60, 30d supply, fill #0
  Filled 2022-06-26: qty 60, 30d supply, fill #1

## 2022-05-21 ENCOUNTER — Ambulatory Visit: Payer: BLUE CROSS/BLUE SHIELD | Admitting: Obstetrics and Gynecology

## 2022-05-21 ENCOUNTER — Encounter: Payer: Self-pay | Admitting: Obstetrics and Gynecology

## 2022-05-21 ENCOUNTER — Other Ambulatory Visit (HOSPITAL_COMMUNITY): Payer: Self-pay

## 2022-05-21 VITALS — BP 171/104 | HR 73 | Ht 64.0 in | Wt 214.0 lb

## 2022-05-21 DIAGNOSIS — N938 Other specified abnormal uterine and vaginal bleeding: Secondary | ICD-10-CM

## 2022-05-21 NOTE — Progress Notes (Signed)
87 y,o GYN presents for [place] results.

## 2022-05-21 NOTE — Progress Notes (Signed)
Ms Rissmiller presents for f/u for GYN U/S. GYN U/S results reviewed with pt. She reports no bleeding since last visit  PE AF VSS Lungs clear Heart RRR Abd soft + BS  A/P  DUB  Discussed EMBX with pt as endometrium was at 0.6 mm. Pt would like to follow bleeding pattern for now. Will f/u in 3 months. Pt to call if she has any bleeding in the meantime to schedule EMBX. Will hold any treatment otherwise at this time until [facility] completed and or if needed.

## 2022-06-03 ENCOUNTER — Other Ambulatory Visit: Payer: Self-pay

## 2022-06-03 ENCOUNTER — Encounter (HOSPITAL_BASED_OUTPATIENT_CLINIC_OR_DEPARTMENT_OTHER): Payer: Self-pay | Admitting: General Surgery

## 2022-06-07 ENCOUNTER — Ambulatory Visit
Admission: RE | Admit: 2022-06-07 | Discharge: 2022-06-07 | Disposition: A | Discharge status: Home / Self Care | Payer: BLUE CROSS/BLUE SHIELD | Source: Ambulatory Visit | Attending: General Surgery | Admitting: General Surgery

## 2022-06-07 DIAGNOSIS — R928 Other abnormal and inconclusive findings on diagnostic imaging of breast: Secondary | ICD-10-CM | POA: Diagnosis not present

## 2022-06-07 DIAGNOSIS — D242 Benign neoplasm of left breast: Secondary | ICD-10-CM

## 2022-06-07 NOTE — Progress Notes (Signed)
       Patient Instructions  The night before surgery:  No food after midnight. ONLY clear liquids after midnight  The day of surgery (if you do NOT have diabetes):  Drink ONE (1) Pre-Surgery Clear Ensure as directed.   This drink was given to you during your hospital  pre-op appointment visit. The pre-op nurse will instruct you on the time to drink the  Pre-Surgery Ensure depending on your surgery time. Finish the drink at the designated time by the pre-op nurse.  Nothing else to drink after completing the  Pre-Surgery Clear Ensure.  The day of surgery (if you have diabetes): Drink ONE (1) Gatorade 2 (G2) as directed. This drink was given to you during your hospital  pre-op appointment visit.  The pre-op nurse will instruct you on the time to drink the   Gatorade 2 (G2) depending on your surgery time. Color of the Gatorade may vary. Red is not allowed. Nothing else to drink after completing the  Gatorade 2 (G2).         If you have questions, please contact your surgeon's office.Patient was provided with CHG cleanser to use at home before the procedure. Patient verbalized understanding of instructions. 

## 2022-06-10 ENCOUNTER — Ambulatory Visit
Admission: RE | Admit: 2022-06-10 | Discharge: 2022-06-10 | Disposition: A | Discharge status: Home / Self Care | Payer: BLUE CROSS/BLUE SHIELD | Source: Ambulatory Visit | Attending: General Surgery | Admitting: General Surgery

## 2022-06-10 ENCOUNTER — Other Ambulatory Visit (HOSPITAL_COMMUNITY): Payer: Self-pay

## 2022-06-10 ENCOUNTER — Ambulatory Visit (HOSPITAL_BASED_OUTPATIENT_CLINIC_OR_DEPARTMENT_OTHER)
Admission: RE | Admit: 2022-06-10 | Discharge: 2022-06-10 | Disposition: A | Discharge status: Home / Self Care | Payer: BLUE CROSS/BLUE SHIELD | Attending: General Surgery | Admitting: General Surgery

## 2022-06-10 ENCOUNTER — Encounter (HOSPITAL_BASED_OUTPATIENT_CLINIC_OR_DEPARTMENT_OTHER): Payer: Self-pay | Admitting: General Surgery

## 2022-06-10 ENCOUNTER — Encounter (HOSPITAL_BASED_OUTPATIENT_CLINIC_OR_DEPARTMENT_OTHER)
Admission: RE | Disposition: A | Discharge status: Home / Self Care | Payer: Self-pay | Source: Home / Self Care | Attending: General Surgery

## 2022-06-10 ENCOUNTER — Ambulatory Visit (HOSPITAL_BASED_OUTPATIENT_CLINIC_OR_DEPARTMENT_OTHER): Payer: BLUE CROSS/BLUE SHIELD | Admitting: Certified Registered"

## 2022-06-10 ENCOUNTER — Other Ambulatory Visit: Payer: Self-pay

## 2022-06-10 DIAGNOSIS — I10 Essential (primary) hypertension: Secondary | ICD-10-CM | POA: Insufficient documentation

## 2022-06-10 DIAGNOSIS — Z79899 Other long term (current) drug therapy: Secondary | ICD-10-CM | POA: Insufficient documentation

## 2022-06-10 DIAGNOSIS — Z01818 Encounter for other preprocedural examination: Secondary | ICD-10-CM

## 2022-06-10 DIAGNOSIS — E119 Type 2 diabetes mellitus without complications: Secondary | ICD-10-CM | POA: Diagnosis not present

## 2022-06-10 DIAGNOSIS — D242 Benign neoplasm of left breast: Secondary | ICD-10-CM | POA: Insufficient documentation

## 2022-06-10 DIAGNOSIS — R928 Other abnormal and inconclusive findings on diagnostic imaging of breast: Secondary | ICD-10-CM | POA: Diagnosis not present

## 2022-06-10 DIAGNOSIS — N6489 Other specified disorders of breast: Secondary | ICD-10-CM | POA: Diagnosis not present

## 2022-06-10 DIAGNOSIS — N62 Hypertrophy of breast: Secondary | ICD-10-CM | POA: Diagnosis not present

## 2022-06-10 HISTORY — DX: Anemia, unspecified: D64.9

## 2022-06-10 HISTORY — PX: BREAST LUMPECTOMY WITH RADIOACTIVE SEED LOCALIZATION: SHX6424

## 2022-06-10 HISTORY — DX: Essential (primary) hypertension: I10

## 2022-06-10 LAB — GLUCOSE, CAPILLARY
Glucose-Capillary: 88 mg/dL (ref 70–99)
Glucose-Capillary: 107 mg/dL — ABNORMAL HIGH (ref 70–99)

## 2022-06-10 LAB — POCT PREGNANCY, URINE: Preg Test, Ur: NEGATIVE

## 2022-06-10 SURGERY — BREAST LUMPECTOMY WITH RADIOACTIVE SEED LOCALIZATION
Anesthesia: General | Site: Breast | Laterality: Left

## 2022-06-10 MED ORDER — GABAPENTIN 300 MG PO CAPS
ORAL_CAPSULE | ORAL | Status: AC
  Filled 2022-06-10: qty 1

## 2022-06-10 MED ORDER — OXYCODONE HCL 5 MG/5ML PO SOLN: 5.0000 mg | Freq: Once | ORAL | Status: DC | PRN

## 2022-06-10 MED ORDER — OXYCODONE HCL 5 MG PO TABS: 5.0000 mg | ORAL_TABLET | Freq: Once | ORAL | Status: DC | PRN

## 2022-06-10 MED ORDER — CELECOXIB 200 MG PO CAPS
ORAL_CAPSULE | ORAL | Status: AC
  Filled 2022-06-10: qty 1

## 2022-06-10 MED ORDER — MIDAZOLAM HCL 2 MG/2ML IJ SOLN
INTRAMUSCULAR | Status: AC
  Filled 2022-06-10: qty 2

## 2022-06-10 MED ORDER — CEFAZOLIN SODIUM-DEXTROSE 2-4 GM/100ML-% IV SOLN
INTRAVENOUS | Status: AC
  Filled 2022-06-10: qty 100

## 2022-06-10 MED ORDER — MIDAZOLAM HCL 5 MG/5ML IJ SOLN
INTRAMUSCULAR | Status: DC | PRN
  Administered 2022-06-10: 2 mg via INTRAVENOUS

## 2022-06-10 MED ORDER — PROMETHAZINE HCL 25 MG/ML IJ SOLN: 6.2500 mg | INTRAMUSCULAR | Status: DC | PRN

## 2022-06-10 MED ORDER — BUPIVACAINE-EPINEPHRINE (PF) 0.25% -1:200000 IJ SOLN
INTRAMUSCULAR | Status: DC | PRN
  Administered 2022-06-10: 10 mL via PERINEURAL

## 2022-06-10 MED ORDER — CHLORHEXIDINE GLUCONATE CLOTH 2 % EX PADS: 6.0000 | MEDICATED_PAD | Freq: Once | CUTANEOUS | Status: DC

## 2022-06-10 MED ORDER — CEFAZOLIN SODIUM-DEXTROSE 2-4 GM/100ML-% IV SOLN
2.0000 g | INTRAVENOUS | Status: AC
  Administered 2022-06-10: 2 g via INTRAVENOUS

## 2022-06-10 MED ORDER — HYDROMORPHONE HCL 1 MG/ML IJ SOLN: 0.2500 mg | INTRAMUSCULAR | Status: DC | PRN

## 2022-06-10 MED ORDER — ACETAMINOPHEN 500 MG PO TABS
1000.0000 mg | ORAL_TABLET | ORAL | Status: AC
  Administered 2022-06-10: 1000 mg via ORAL

## 2022-06-10 MED ORDER — AMISULPRIDE (ANTIEMETIC) 5 MG/2ML IV SOLN: 10.0000 mg | Freq: Once | INTRAVENOUS | Status: DC | PRN

## 2022-06-10 MED ORDER — PHENYLEPHRINE HCL (PRESSORS) 10 MG/ML IV SOLN
INTRAVENOUS | Status: DC | PRN
  Administered 2022-06-10: 40 ug via INTRAVENOUS

## 2022-06-10 MED ORDER — PROPOFOL 500 MG/50ML IV EMUL
INTRAVENOUS | Status: DC | PRN
  Administered 2022-06-10: 25 ug/kg/min via INTRAVENOUS

## 2022-06-10 MED ORDER — FENTANYL CITRATE (PF) 100 MCG/2ML IJ SOLN
INTRAMUSCULAR | Status: DC | PRN
Start: 2022-06-10 — End: 2022-06-10
  Administered 2022-06-10: 100 ug via INTRAVENOUS

## 2022-06-10 MED ORDER — MEPERIDINE HCL 25 MG/ML IJ SOLN: 6.2500 mg | INTRAMUSCULAR | Status: DC | PRN

## 2022-06-10 MED ORDER — GABAPENTIN 300 MG PO CAPS
300.0000 mg | ORAL_CAPSULE | ORAL | Status: AC
  Administered 2022-06-10: 300 mg via ORAL

## 2022-06-10 MED ORDER — LACTATED RINGERS IV SOLN: INTRAVENOUS | Status: DC

## 2022-06-10 MED ORDER — EPHEDRINE SULFATE (PRESSORS) 50 MG/ML IJ SOLN
INTRAMUSCULAR | Status: DC | PRN
  Administered 2022-06-10: 10 mg via INTRAVENOUS

## 2022-06-10 MED ORDER — ONDANSETRON HCL 4 MG/2ML IJ SOLN
INTRAMUSCULAR | Status: DC | PRN
  Administered 2022-06-10: 4 mg via INTRAVENOUS

## 2022-06-10 MED ORDER — LIDOCAINE 2% (20 MG/ML) 5 ML SYRINGE
INTRAMUSCULAR | Status: DC | PRN
  Administered 2022-06-10: 60 mg via INTRAVENOUS

## 2022-06-10 MED ORDER — CELECOXIB 200 MG PO CAPS
200.0000 mg | ORAL_CAPSULE | ORAL | Status: AC
  Administered 2022-06-10: 200 mg via ORAL

## 2022-06-10 MED ORDER — BUPIVACAINE-EPINEPHRINE (PF) 0.25% -1:200000 IJ SOLN
INTRAMUSCULAR | Status: AC
  Filled 2022-06-10: qty 30

## 2022-06-10 MED ORDER — ACETAMINOPHEN 500 MG PO TABS
ORAL_TABLET | ORAL | Status: AC
  Filled 2022-06-10: qty 2

## 2022-06-10 MED ORDER — OXYCODONE HCL 5 MG PO TABS
5.0000 mg | ORAL_TABLET | Freq: Four times a day (QID) | ORAL | 0 refills | Status: DC | PRN
  Filled 2022-06-10: qty 10, 3d supply, fill #0

## 2022-06-10 MED ORDER — PROPOFOL 10 MG/ML IV BOLUS
INTRAVENOUS | Status: DC | PRN
  Administered 2022-06-10: 150 mg via INTRAVENOUS

## 2022-06-10 MED ORDER — FENTANYL CITRATE (PF) 100 MCG/2ML IJ SOLN
INTRAMUSCULAR | Status: AC
  Filled 2022-06-10: qty 2

## 2022-06-10 SURGICAL SUPPLY — 43 items
ADH SKN CLS APL DERMABOND .7 (GAUZE/BANDAGES/DRESSINGS) ×1
APL PRP STRL LF DISP 70% ISPRP (MISCELLANEOUS) ×1
APPLIER CLIP 9.375 MED OPEN (MISCELLANEOUS)
APR CLP MED 9.3 20 MLT OPN (MISCELLANEOUS)
BLADE SURG 15 STRL LF DISP TIS (BLADE) ×1 IMPLANT
BLADE SURG 15 STRL SS (BLADE) ×1
CANISTER SUC SOCK COL 7IN (MISCELLANEOUS) ×1 IMPLANT
CANISTER SUCT 1200ML W/VALVE (MISCELLANEOUS) ×1 IMPLANT
CHLORAPREP W/TINT 26 (MISCELLANEOUS) ×1 IMPLANT
CLIP APPLIE 9.375 MED OPEN (MISCELLANEOUS) IMPLANT
COVER BACK TABLE 60X90IN (DRAPES) ×1 IMPLANT
COVER MAYO STAND STRL (DRAPES) ×1 IMPLANT
COVER PROBE W GEL 5X96 (DRAPES) ×1 IMPLANT
DERMABOND ADVANCED (GAUZE/BANDAGES/DRESSINGS) ×1
DERMABOND ADVANCED .7 DNX12 (GAUZE/BANDAGES/DRESSINGS) ×1 IMPLANT
DRAPE LAPAROSCOPIC ABDOMINAL (DRAPES) ×1 IMPLANT
DRAPE UTILITY XL STRL (DRAPES) ×1 IMPLANT
ELECT COATED BLADE 2.86 ST (ELECTRODE) ×1 IMPLANT
ELECT REM PT RETURN 9FT ADLT (ELECTROSURGICAL) ×1
ELECTRODE REM PT RTRN 9FT ADLT (ELECTROSURGICAL) ×1 IMPLANT
GLOVE BIO SURGEON STRL SZ7.5 (GLOVE) ×2 IMPLANT
GOWN STRL REUS W/ TWL LRG LVL3 (GOWN DISPOSABLE) ×2 IMPLANT
GOWN STRL REUS W/TWL LRG LVL3 (GOWN DISPOSABLE) ×2
ILLUMINATOR WAVEGUIDE N/F (MISCELLANEOUS) IMPLANT
KIT MARKER MARGIN INK (KITS) ×1 IMPLANT
LIGHT WAVEGUIDE WIDE FLAT (MISCELLANEOUS) IMPLANT
MAT PREVALON FULL STRYKER (MISCELLANEOUS) IMPLANT
NDL HYPO 25X1 1.5 SAFETY (NEEDLE) IMPLANT
NEEDLE HYPO 25X1 1.5 SAFETY (NEEDLE) IMPLANT
NS IRRIG 1000ML POUR BTL (IV SOLUTION) IMPLANT
PACK BASIN DAY SURGERY FS (CUSTOM PROCEDURE TRAY) ×1 IMPLANT
PENCIL SMOKE EVACUATOR (MISCELLANEOUS) ×1 IMPLANT
SLEEVE SCD COMPRESS KNEE MED (STOCKING) ×1 IMPLANT
SPIKE FLUID TRANSFER (MISCELLANEOUS) IMPLANT
SPONGE T-LAP 18X18 ~~LOC~~+RFID (SPONGE) ×1 IMPLANT
SUT MON AB 4-0 PC3 18 (SUTURE) ×1 IMPLANT
SUT SILK 2 0 SH (SUTURE) IMPLANT
SUT VICRYL 3-0 CR8 SH (SUTURE) ×1 IMPLANT
SYR CONTROL 10ML LL (SYRINGE) IMPLANT
TOWEL GREEN STERILE FF (TOWEL DISPOSABLE) ×1 IMPLANT
TRAY FAXITRON CT DISP (TRAY / TRAY PROCEDURE) ×1 IMPLANT
TUBE CONNECTING 20X1/4 (TUBING) ×1 IMPLANT
YANKAUER SUCT BULB TIP NO VENT (SUCTIONS) IMPLANT

## 2022-06-10 NOTE — Op Note (Signed)
06/10/2022  8:52 AM  PATIENT:  Linda [name]  56 y.o. female  PRE-OPERATIVE DIAGNOSIS:  LEFT BREAST PAPILLOMA  POST-OPERATIVE DIAGNOSIS:  LEFT BREAST PAPILLOMA  PROCEDURE:  Procedure(s): LEFT BREAST LUMPECTOMY WITH RADIOACTIVE SEED LOCALIZATION (Left)  SURGEON:  Surgeon(s) and Role:    * Jovita Kussmaul, MD - Primary  PHYSICIAN ASSISTANT:   ASSISTANTS: none   ANESTHESIA:   local and general  EBL:  minimal   BLOOD ADMINISTERED:none  DRAINS: none   LOCAL MEDICATIONS USED:  MARCAINE     SPECIMEN:  Source of Specimen:  left breast tissue  DISPOSITION OF SPECIMEN:  PATHOLOGY  COUNTS:  YES  TOURNIQUET:  * No tourniquets in log *  DICTATION: .Dragon Dictation  After informed consent was obtained the patient was brought to the operating room and placed in the supine position on the operating table.  After adequate induction of general anesthesia the patient's left breast was prepped with ChloraPrep, allowed to dry, and draped in usual sterile manner.  An appropriate timeout was performed.  Previously an I-125 seed was placed in the lower outer subareolar left breast to mark an area of intraductal papilloma.  The neoprobe was set to I-125 in the area of radioactivity was readily identified.  The area around this was infiltrated with quarter percent Marcaine.  A curvilinear incision was made along the lower outer edge of the areola with a 15 blade knife.  The incision was carried through the skin and subcutaneous tissue sharply with the electrocautery.  Dissection was then carried widely around the radioactive seed while checking the area of radioactivity frequently.  Once the specimen was removed it was oriented with the appropriate paint colors.  A specimen radiograph was obtained that showed the clip and seed to be within the specimen.  The specimen was then sent to pathology for further evaluation.  Hemostasis was achieved using the Bovie electrocautery.  The wound was irrigated  with saline and infiltrated with quarter percent Marcaine.  The deep layer of the wound was then closed with layers of interrupted 3-0 Vicryl stitches.  The skin was then closed with interrupted 4-0 Monocryl subcuticular stitches.  Dermabond dressings were applied.  The patient tolerated the procedure well.  At the end of the case all needle sponge and instrument counts were correct.  The patient was then awakened and taken to recovery in stable condition.  PLAN OF CARE: Discharge to home after PACU  PATIENT DISPOSITION:  PACU - hemodynamically stable.   Delay start of Pharmacological VTE agent (>24hrs) due to surgical blood loss or risk of bleeding: not applicable

## 2022-06-10 NOTE — Interval H&P Note (Signed)
History and Physical Interval Note:  06/10/2022 7:54 AM  Linda [name]  has presented today for surgery, with the diagnosis of LEFT BREAST PAPILLOMA.  The various methods of treatment have been discussed with the patient and family. After consideration of risks, benefits and other options for treatment, the patient has consented to  Procedure(s): LEFT BREAST LUMPECTOMY WITH RADIOACTIVE SEED LOCALIZATION (Left) as a surgical intervention.  The patient's history has been reviewed, patient examined, no change in status, stable for surgery.  I have reviewed the patient's chart and labs.  Questions were answered to the patient's satisfaction.     Autumn Messing III

## 2022-06-10 NOTE — H&P (Signed)
REFERRING PHYSICIAN: [name], NP  PROVIDER: Landry Corporal, MD  MRN: [id number] DOB: 1966-03-05 Subjective   Chief Complaint: New Consultation (Left Breast )   History of Present Illness: Linda [name] is a 56 y.o. female who is seen today as an office consultation for evaluation of New Consultation (Left Breast ) .   We are asked to see the patient in consultation by Dr. Darleen Crocker to evaluate her for a papilloma of the left breast. The patient is a 56 year old black female who recently went for a routine screening mammogram. At that time she was found to have a 7 mm mass in a duct of the lower portion of the left subareolar breast. The mass was biopsied and came back as a sclerosed intraductal papilloma. She also had a palpable lymph node in the left axilla that was looked at and felt to have a normal morphology. She has no family history of breast cancer. She is otherwise pretty healthy and does not smoke.  Review of Systems: A complete review of systems was obtained from the patient. I have reviewed this information and discussed as appropriate with the patient. See HPI as well for other ROS.  ROS   Medical History: Past Medical History:  Diagnosis Date  Diabetes mellitus without complication (CMS-HCC)  GERD (gastroesophageal reflux disease)  History of cancer  Hypertension   Patient Active Problem List  Diagnosis  Papilloma of left breast   Past Surgical History:  Procedure Laterality Date  CESAREAN SECTION  1986,1997,2000,2002    Not on File  Current Outpatient Medications on File Prior to Visit  Medication Sig Dispense Refill  bismuth subsalicylate 170 mg Tab Take by mouth  ergocalciferol, vitamin D2, 1,250 mcg (50,000 unit) capsule Take by mouth  ferrous sulfate 325 (65 FE) MG tablet Take by mouth  metFORMIN (GLUCOPHAGE) 500 MG tablet Take by mouth  metroNIDAZOLE (FLAGYL) 250 MG tablet Take by mouth  omeprazole (PRILOSEC) 40 MG DR capsule Take by  mouth  tetracycline 500 MG capsule Take 500 mg by mouth 4 (four) times daily   No current facility-administered medications on file prior to visit.   History reviewed. No pertinent family history.   Social History   Tobacco Use  Smoking Status Never  Smokeless Tobacco Never    Social History   Socioeconomic History  Marital status: Single  Tobacco Use  Smoking status: Never  Smokeless tobacco: Never  Substance and Sexual Activity  Alcohol use: Not Currently  Drug use: Never   Objective:   Vitals:  BP: (!) 162/82  Pulse: 108  Temp: 36.8 C (98.3 F)  SpO2: 96%  Weight: 94.5 kg (208 lb 6.4 oz)  Height: 162.6 cm ('5\' 4"'$ )   Body mass index is 35.77 kg/m.  Physical Exam Vitals reviewed.  Constitutional:  General: She is not in acute distress. Appearance: Normal appearance.  HENT:  Head: Normocephalic and atraumatic.  Right Ear: External ear normal.  Left Ear: External ear normal.  Nose: Nose normal.  Mouth/Throat:  Mouth: Mucous membranes are moist.  Pharynx: Oropharynx is clear.  Eyes:  General: No scleral icterus. Extraocular Movements: Extraocular movements intact.  Conjunctiva/sclera: Conjunctivae normal.  Pupils: Pupils are equal, round, and reactive to light.  Cardiovascular:  Rate and Rhythm: Normal rate and regular rhythm.  Pulses: Normal pulses.  Heart sounds: Normal heart sounds.  Pulmonary:  Effort: Pulmonary effort is normal. No respiratory distress.  Breath sounds: Normal breath sounds.  Abdominal:  General: Bowel sounds are normal.  Palpations:  Abdomen is soft.  Tenderness: There is no abdominal tenderness.  Musculoskeletal:  General: No swelling, tenderness or deformity. Normal range of motion.  Cervical back: Normal range of motion and neck supple.  Skin: General: Skin is warm and dry.  Coloration: Skin is not jaundiced.  Neurological:  General: No focal deficit present.  Mental Status: She is alert and oriented to person, place,  and time.  Psychiatric:  Mood and Affect: Mood normal.  Behavior: Behavior normal.    Breast: There is no palpable mass in either breast. There is no palpable axillary, supraclavicular, or cervical lymphadenopathy except for 1 mobile palpable lymph node in the left axilla that was felt to have normal morphology on ultrasound.  Labs, Imaging and Diagnostic Testing:  Assessment and Plan:   Diagnoses and all orders for this visit:  Papilloma of left breast    The patient appears to have a 7 mm intraductal papilloma in the lower portion of the left breast. Because of the abnormal appearance of that the recommendation is to have this area removed. She would also like to have this done. I have discussed with her in detail the risks and benefits of the operation as well as some of the technical aspects including the use of a radioactive seed for localization and she understands and wishes to proceed.

## 2022-06-10 NOTE — Anesthesia Postprocedure Evaluation (Signed)
Anesthesia Post Note  Patient: Linda [name]  Procedure(s) Performed: LEFT BREAST LUMPECTOMY WITH RADIOACTIVE SEED LOCALIZATION (Left: Breast)     Patient location during evaluation: PACU Anesthesia Type: General Level of consciousness: awake and alert Pain management: pain level controlled Vital Signs Assessment: post-procedure vital signs reviewed and stable Respiratory status: spontaneous breathing, nonlabored ventilation and respiratory function stable Cardiovascular status: blood pressure returned to baseline and stable Postop Assessment: no apparent nausea or vomiting Anesthetic complications: no   No notable events documented.  Last Vitals:  Vitals:   06/10/22 0926 06/10/22 0944  BP: 122/78 116/72  Pulse: 70 63  Resp: 15 16  Temp:  36.6 C  SpO2: 95% 98%    Last Pain:  Vitals:   06/10/22 0728  TempSrc: Oral  PainSc: 0-No pain                 [name]

## 2022-06-10 NOTE — Discharge Instructions (Addendum)
  Post Anesthesia Home Care Instructions  Activity: Get plenty of rest for the remainder of the day. A responsible individual must stay with you for 24 hours following the procedure.  For the next 24 hours, DO NOT: -Drive a car -Paediatric nurse -Drink alcoholic beverages -Take any medication unless instructed by your physician -Make any legal decisions or sign important papers.  Meals: Start with liquid foods such as gelatin or soup. Progress to regular foods as tolerated. Avoid greasy, spicy, heavy foods. If nausea and/or vomiting occur, drink only clear liquids until the nausea and/or vomiting subsides. Call your physician if vomiting continues.  Special Instructions/Symptoms: Your throat may feel dry or sore from the anesthesia or the breathing tube placed in your throat during surgery. If this causes discomfort, gargle with warm salt water. The discomfort should disappear within 24 hours.  If you had a scopolamine patch placed behind your ear for the management of post- operative nausea and/or vomiting:  1. The medication in the patch is effective for 72 hours, after which it should be removed.  Wrap patch in a tissue and discard in the trash. Wash hands thoroughly with soap and water. 2. You may remove the patch earlier than 72 hours if you experience unpleasant side effects which may include dry mouth, dizziness or visual disturbances. 3. Avoid touching the patch. Wash your hands with soap and water after contact with the patch.      Next dose of Tylenol can be given 1:30pm if needed. Next dose of NSAID (Ibuprofen/Motrin/Aleve) can be given at 3:30pm if needed.

## 2022-06-10 NOTE — Anesthesia Preprocedure Evaluation (Signed)
Anesthesia Evaluation  Patient identified by MRN, date of birth, ID band Patient awake    Reviewed: Allergy & Precautions, NPO status , Patient's Chart, lab work & pertinent test results  Airway Mallampati: II  TM Distance: >3 FB Neck ROM: Full    Dental no notable dental hx.    Pulmonary neg pulmonary ROS,    Pulmonary exam normal breath sounds clear to auscultation       Cardiovascular hypertension, Pt. on medications negative cardio ROS Normal cardiovascular exam Rhythm:Regular Rate:Normal     Neuro/Psych negative neurological ROS  negative psych ROS   GI/Hepatic negative GI ROS, Neg liver ROS,   Endo/Other  negative endocrine ROS  Renal/GU negative Renal ROS  negative genitourinary   Musculoskeletal negative musculoskeletal ROS (+)   Abdominal (+) + obese,   Peds negative pediatric ROS (+)  Hematology negative hematology ROS (+)   Anesthesia Other Findings   Reproductive/Obstetrics negative OB ROS                             Anesthesia Physical Anesthesia Plan  ASA: 2  Anesthesia Plan: General   Post-op Pain Management: Tylenol PO (pre-op)*, Celebrex PO (pre-op)* and Gabapentin PO (pre-op)*   Induction: Intravenous  PONV Risk Score and Plan: 3 and Ondansetron, Dexamethasone, Midazolam and Treatment may vary due to age or medical condition  Airway Management Planned: LMA  Additional Equipment:   Intra-op Plan:   Post-operative Plan: Extubation in OR  Informed Consent: I have reviewed the patients History and Physical, chart, labs and discussed the procedure including the risks, benefits and alternatives for the proposed anesthesia with the patient or authorized representative who has indicated his/her understanding and acceptance.     Dental advisory given  Plan Discussed with: CRNA  Anesthesia Plan Comments:         Anesthesia Quick Evaluation

## 2022-06-10 NOTE — Transfer of Care (Signed)
Immediate Anesthesia Transfer of Care Note  Patient: Linda [name]  Procedure(s) Performed: LEFT BREAST LUMPECTOMY WITH RADIOACTIVE SEED LOCALIZATION (Left: Breast)  Patient Location: PACU  Anesthesia Type:General  Level of Consciousness: drowsy and patient cooperative  Airway & Oxygen Therapy: Patient Spontanous Breathing and Patient connected to face mask oxygen  Post-op Assessment: Report given to RN and Post -op Vital signs reviewed and stable  Post vital signs: Reviewed and stable  Last Vitals:  Vitals Value Taken Time  BP    Temp    Pulse 80 06/10/22 0855  Resp 20 06/10/22 0855  SpO2 96 % 06/10/22 0855  Vitals shown include unvalidated device data.  Last Pain:  Vitals:   06/10/22 0728  TempSrc: Oral  PainSc: 0-No pain      Patients Stated Pain Goal: 4 (48/18/56 3149)  Complications: No notable events documented.

## 2022-06-10 NOTE — Anesthesia Procedure Notes (Signed)
Procedure Name: LMA Insertion Date/Time: 06/10/2022 8:14 AM  Performed by: Ellinor Test, Ernesta Amble, CRNAPre-anesthesia Checklist: Patient identified, Emergency Drugs available, Suction available and Patient being monitored Patient Re-evaluated:Patient Re-evaluated prior to induction Oxygen Delivery Method: Circle system utilized Preoxygenation: Pre-oxygenation with 100% oxygen Induction Type: IV induction Ventilation: Mask ventilation without difficulty LMA: LMA inserted LMA Size: 4.0 Number of attempts: 1 Airway Equipment and Method: Bite block Placement Confirmation: positive ETCO2 Tube secured with: Tape Dental Injury: Teeth and Oropharynx as per pre-operative assessment

## 2022-06-11 ENCOUNTER — Encounter (HOSPITAL_BASED_OUTPATIENT_CLINIC_OR_DEPARTMENT_OTHER): Payer: Self-pay | Admitting: General Surgery

## 2022-06-11 LAB — SURGICAL PATHOLOGY

## 2022-06-26 ENCOUNTER — Encounter (HOSPITAL_COMMUNITY): Payer: Self-pay

## 2022-06-26 ENCOUNTER — Other Ambulatory Visit (HOSPITAL_COMMUNITY): Payer: Self-pay

## 2022-07-15 ENCOUNTER — Encounter: Payer: BLUE CROSS/BLUE SHIELD | Admitting: Nurse Practitioner

## 2022-07-15 NOTE — Patient Instructions (Signed)
Hypertension, Adult High blood pressure (hypertension) is when the force of blood pumping through the arteries is too strong. The arteries are the blood vessels that carry blood from the heart throughout the body. Hypertension forces the heart to work harder to pump blood and may cause arteries to become narrow or stiff. Untreated or uncontrolled hypertension can lead to a heart attack, heart failure, a stroke, kidney disease, and other problems. A blood pressure reading consists of a higher number over a lower number. Ideally, your blood pressure should be below 120/80. The first ("top") number is called the systolic pressure. It is a measure of the pressure in your arteries as your heart beats. The second ("bottom") number is called the diastolic pressure. It is a measure of the pressure in your arteries as the heart relaxes. What are the causes? The exact cause of this condition is not known. There are some conditions that result in high blood pressure. What increases the risk? Certain factors may make you more likely to develop high blood pressure. Some of these risk factors are under your control, including: Smoking. Not getting enough exercise or physical activity. Being overweight. Having too much fat, sugar, calories, or salt (sodium) in your diet. Drinking too much alcohol. Other risk factors include: Having a personal history of heart disease, diabetes, high cholesterol, or kidney disease. Stress. Having a family history of high blood pressure and high cholesterol. Having obstructive sleep apnea. Age. The risk increases with age. What are the signs or symptoms? High blood pressure may not cause symptoms. Very high blood pressure (hypertensive crisis) may cause: Headache. Fast or irregular heartbeats (palpitations). Shortness of breath. Nosebleed. Nausea and vomiting. Vision changes. Severe chest pain, dizziness, and seizures. How is this diagnosed? This condition is diagnosed by  measuring your blood pressure while you are seated, with your arm resting on a flat surface, your legs uncrossed, and your feet flat on the floor. The cuff of the blood pressure monitor will be placed directly against the skin of your upper arm at the level of your heart. Blood pressure should be measured at least twice using the same arm. Certain conditions can cause a difference in blood pressure between your right and left arms. If you have a high blood pressure reading during one visit or you have normal blood pressure with other risk factors, you may be asked to: Return on a different day to have your blood pressure checked again. Monitor your blood pressure at home for 1 week or longer. If you are diagnosed with hypertension, you may have other blood or imaging tests to help your health care provider understand your overall risk for other conditions. How is this treated? This condition is treated by making healthy lifestyle changes, such as eating healthy foods, exercising more, and reducing your alcohol intake. You may be referred for counseling on a healthy diet and physical activity. Your health care provider may prescribe medicine if lifestyle changes are not enough to get your blood pressure under control and if: Your systolic blood pressure is above 130. Your diastolic blood pressure is above 80. Your personal target blood pressure may vary depending on your medical conditions, your age, and other factors. Follow these instructions at home: Eating and drinking  Eat a diet that is high in fiber and potassium, and low in sodium, added sugar, and fat. An example of this eating plan is called the DASH diet. DASH stands for Dietary Approaches to Stop Hypertension. To eat this way: Eat   plenty of fresh fruits and vegetables. Try to fill one half of your plate at each meal with fruits and vegetables. Eat whole grains, such as whole-wheat pasta, brown rice, or whole-grain bread. Fill about one  fourth of your plate with whole grains. Eat or drink low-fat dairy products, such as skim milk or low-fat yogurt. Avoid fatty cuts of meat, processed or cured meats, and poultry with skin. Fill about one fourth of your plate with lean proteins, such as fish, chicken without skin, beans, eggs, or tofu. Avoid pre-made and processed foods. These tend to be higher in sodium, added sugar, and fat. Reduce your daily sodium intake. Many people with hypertension should eat less than 1,500 mg of sodium a day. Do not drink alcohol if: Your health care provider tells you not to drink. You are pregnant, may be pregnant, or are planning to become pregnant. If you drink alcohol: Limit how much you have to: 0-1 drink a day for women. 0-2 drinks a day for men. Know how much alcohol is in your drink. In the [place] one drink equals one 12 oz bottle of beer (355 mL), one 5 oz glass of wine (148 mL), or one 1 oz glass of hard liquor (44 mL). Lifestyle  Work with your health care provider to maintain a healthy body weight or to lose weight. Ask what an ideal weight is for you. Get at least 30 minutes of exercise that causes your heart to beat faster (aerobic exercise) most days of the week. Activities may include walking, swimming, or biking. Include exercise to strengthen your muscles (resistance exercise), such as Pilates or lifting weights, as part of your weekly exercise routine. Try to do these types of exercises for 30 minutes at least 3 days a week. Do not use any products that contain nicotine or tobacco. These products include cigarettes, chewing tobacco, and vaping devices, such as e-cigarettes. If you need help quitting, ask your health care provider. Monitor your blood pressure at home as told by your health care provider. Keep all follow-up visits. This is important. Medicines Take over-the-counter and prescription medicines only as told by your health care provider. Follow directions carefully. Blood  pressure medicines must be taken as prescribed. Do not skip doses of blood pressure medicine. Doing this puts you at risk for problems and can make the medicine less effective. Ask your health care provider about side effects or reactions to medicines that you should watch for. Contact a health care provider if you: Think you are having a reaction to a medicine you are taking. Have headaches that keep coming back (recurring). Feel dizzy. Have swelling in your ankles. Have trouble with your vision. Get help right away if you: Develop a severe headache or confusion. Have unusual weakness or numbness. Feel faint. Have severe pain in your chest or abdomen. Vomit repeatedly. Have trouble breathing. These symptoms may be an emergency. Get help right away. Call 911. Do not wait to see if the symptoms will go away. Do not drive yourself to the hospital. Summary Hypertension is when the force of blood pumping through your arteries is too strong. If this condition is not controlled, it may put you at risk for serious complications. Your personal target blood pressure may vary depending on your medical conditions, your age, and other factors. For most people, a normal blood pressure is less than 120/80. Hypertension is treated with lifestyle changes, medicines, or a combination of both. Lifestyle changes include losing weight, eating a healthy,   low-sodium diet, exercising more, and limiting alcohol. This information is not intended to replace advice given to you by your health care provider. Make sure you discuss any questions you have with your health care provider. Document Revised: 08/14/2021 Document Reviewed: 08/14/2021 Elsevier Patient Education  2023 Elsevier Inc.  

## 2022-07-15 NOTE — Progress Notes (Signed)
I,Linda [name],acting as a Education administrator for [employer], [name].,have documented all relevant documentation on the behalf of Linda Brine, [name],as directed by  Linda Brine, [name] while in the presence of Linda [name], Linda [name].  Subjective:     Patient ID: Linda [name] , female    DOB: 29-Dec-1965 , 56 y.o.   MRN: [id number]   Chief Complaint  Patient presents with   Hypertension    HPI  Patient presents today for HTN and weight follow up.   BP Readings from Last 3 Encounters: 07/15/22 : (!) 154/98 06/10/22 : 116/72 05/21/22 : Linda [name] 171/104     Hypertension     Past Medical History:  Diagnosis Date   Anemia    Hypertension    Pre-diabetes 02/2022     Family History  Problem Relation Age of Onset   Memory loss Mother    Hypertension Father    Hypertension Brother    Diabetes Maternal Grandmother    Hypertension Maternal Grandmother    Hypertension Maternal Grandfather    Colon cancer Neg Hx    Stomach cancer Neg Hx    Esophageal cancer Neg Hx      Current Outpatient Medications:    ferrous sulfate 325 (65 FE) MG tablet, Take 1 tablet (325 mg total) by mouth daily., Disp: 30 tablet, Rfl: 0   metFORMIN (GLUCOPHAGE) 500 MG tablet, Take 1 tablet (500 mg total) by mouth 2 (two) times daily with a meal., Disp: 180 tablet, Rfl: 1   Semaglutide-Weight Management 0.5 MG/0.5ML SOAJ, Inject 0.5 mg into the skin once a week for 28 days., Disp: 2 mL, Rfl: 0   Semaglutide-Weight Management 1 MG/0.5ML SOAJ, Inject 1 mg into the skin once a week for 28 days., Disp: 2 mL, Rfl: 0   [START ON 08/11/2022] Semaglutide-Weight Management 1.7 MG/0.75ML SOAJ, Inject 1.7 mg into the skin once a week for 28 days., Disp: 3 mL, Rfl: 0   [START ON 09/09/2022] Semaglutide-Weight Management 2.4 MG/0.75ML SOAJ, Inject 2.4 mg into the skin once a week., Disp: 3 mL, Rfl: 1   valsartan (DIOVAN) 160 MG tablet, Take 1 tablet (160 mg total) by mouth daily., Disp: 30 tablet, Rfl: 2   Vitamin D, Ergocalciferol,  (DRISDOL) 1.25 MG (50000 UNIT) CAPS capsule, Take 1 capsule (50,000 Units total) by mouth 2 (two) times a week., Disp: 24 capsule, Rfl: 1   omeprazole (PRILOSEC) 40 MG capsule, Take 1 capsule (40 mg total) by mouth 2 (two) times daily before a meal. (Patient not taking: Reported on 07/15/2022), Disp: 112 capsule, Rfl: 0   oxyCODONE (ROXICODONE) 5 MG immediate release tablet, Take 1 tablet (5 mg total) by mouth every 6 (six) hours as needed for severe pain. (Patient not taking: Reported on 07/15/2022), Disp: 10 tablet, Rfl: 0   No Known Allergies   Review of Systems  Constitutional: Negative.   Respiratory: Negative.    Cardiovascular: Negative.   Gastrointestinal: Negative.   Neurological: Negative.      Today's Vitals   07/15/22 1555  BP: (!) 154/98  Pulse: (!) 102  Temp: 98.6 F (37 C)  TempSrc: Oral  Weight: 212 lb 3.2 oz (96.3 kg)  Height: '5\' 4"'$  (1.626 m)   Body mass index is 36.42 kg/m.  Wt Readings from Last 3 Encounters:  07/15/22 212 lb 3.2 oz (96.3 kg)  06/10/22 209 lb 10.5 oz (95.1 kg)  05/21/22 214 lb (97.1 kg)    Objective:  Physical Exam      Assessment And Plan:  1. Essential hypertension  2. Class 2 obesity due to excess calories without serious comorbidity with body mass index (BMI) of 36.0 to 36.9 in adult She is encouraged to strive for BMI less than 30 to decrease cardiac risk. Advised to aim for at least 150 minutes of exercise per week.     Patient was given opportunity to ask questions. Patient verbalized understanding of the plan and was able to repeat key elements of the plan. All questions were answered to their satisfaction.  [name]   I, [name], have reviewed all documentation for this visit. The documentation on 07/15/22 for the exam, diagnosis, procedures, and orders are all accurate and complete.   IF YOU HAVE BEEN REFERRED TO A SPECIALIST, IT MAY TAKE 1-2 WEEKS TO SCHEDULE/PROCESS THE REFERRAL. IF YOU HAVE NOT HEARD FROM  US/SPECIALIST IN TWO WEEKS, PLEASE GIVE [place] A CALL AT [phone].   THE PATIENT IS ENCOURAGED TO PRACTICE SOCIAL DISTANCING DUE TO THE COVID-19 PANDEMIC.

## 2022-07-16 ENCOUNTER — Other Ambulatory Visit (HOSPITAL_COMMUNITY): Payer: Self-pay

## 2022-07-17 ENCOUNTER — Encounter (HOSPITAL_BASED_OUTPATIENT_CLINIC_OR_DEPARTMENT_OTHER): Payer: Self-pay | Admitting: Emergency Medicine

## 2022-07-17 ENCOUNTER — Emergency Department (HOSPITAL_BASED_OUTPATIENT_CLINIC_OR_DEPARTMENT_OTHER)
Admission: EM | Admit: 2022-07-17 | Discharge: 2022-07-17 | Disposition: A | Discharge status: Home / Self Care | Payer: BLUE CROSS/BLUE SHIELD | Attending: Emergency Medicine | Admitting: Emergency Medicine

## 2022-07-17 ENCOUNTER — Other Ambulatory Visit: Payer: Self-pay

## 2022-07-17 DIAGNOSIS — U071 COVID-19: Secondary | ICD-10-CM | POA: Diagnosis not present

## 2022-07-17 DIAGNOSIS — Z794 Long term (current) use of insulin: Secondary | ICD-10-CM | POA: Diagnosis not present

## 2022-07-17 DIAGNOSIS — Z7984 Long term (current) use of oral hypoglycemic drugs: Secondary | ICD-10-CM | POA: Insufficient documentation

## 2022-07-17 DIAGNOSIS — R059 Cough, unspecified: Secondary | ICD-10-CM | POA: Diagnosis not present

## 2022-07-17 DIAGNOSIS — E119 Type 2 diabetes mellitus without complications: Secondary | ICD-10-CM | POA: Insufficient documentation

## 2022-07-17 LAB — RESP PANEL BY RT-PCR (FLU A&B, COVID) ARPGX2
Influenza A by PCR: NEGATIVE
Influenza B by PCR: NEGATIVE
SARS Coronavirus 2 by RT PCR: POSITIVE — AB

## 2022-07-17 MED ORDER — BENZONATATE 100 MG PO CAPS: 100.0000 mg | ORAL_CAPSULE | Freq: Three times a day (TID) | ORAL | 0 refills | Status: DC

## 2022-07-17 MED ORDER — FLUTICASONE PROPIONATE 50 MCG/ACT NA SUSP: 2.0000 | Freq: Every day | NASAL | 0 refills | Status: DC

## 2022-07-17 MED ORDER — MOLNUPIRAVIR EUA 200MG CAPSULE
4.0000 | ORAL_CAPSULE | Freq: Two times a day (BID) | ORAL | 0 refills | Status: AC
Start: 2022-07-17 — End: 2022-07-22

## 2022-07-17 NOTE — ED Triage Notes (Signed)
Cough and congestion x 2 days 

## 2022-07-17 NOTE — Discharge Instructions (Signed)
Take the medication as prescribed.  Return for new or worsening symptoms otherwise follow-up with primary care provider

## 2022-07-17 NOTE — ED Provider Notes (Signed)
[facility]ERG[facility] DEPT Provider Note   CSN: [id number] Arrival date & time: 07/17/22  1249     History  Chief Complaint  Patient presents with   Cough    Linda [name] is a 56 y.o. female with history of diabetes here for evaluation of cough.  Began on Monday, 2 days PTA.  Associated congestion and rhinorrhea.  No fevers.  No emesis.  No chest pain, shortness of breath or abdominal pain.  No pain to lower extremities.  No sick contacts.  HPI     Home Medications Prior to Admission medications   Medication Sig Start Date End Date Taking? Authorizing Provider  benzonatate (TESSALON) 100 MG capsule Take 1 capsule (100 mg total) by mouth every 8 (eight) hours. 07/17/22  Yes Allena Pietila A, PA-C  ferrous sulfate 325 (65 FE) MG tablet Take 1 tablet (325 mg total) by mouth daily. 10/27/77   Delora Fuel, MD  fluticasone [facility]) 50 MCG/ACT nasal spray Place 2 sprays into both nostrils daily. 07/17/22  Yes Tonji Elliff A, PA-C  metFORMIN (GLUCOPHAGE) 500 MG tablet Take 1 tablet (500 mg total) by mouth 2 (two) times daily with a meal. 05/17/22   [name], [name]  molnupiravir EUA (LAGEVRIO) 200 mg CAPS capsule Take 4 capsules (800 mg total) by mouth 2 (two) times daily for 5 days. 07/17/22 07/22/22 Yes Malani Lees A, PA-C  Semaglutide-Weight Management 0.5 MG/0.5ML SOAJ Inject 0.5 mg into the skin once a week for 28 days. 06/14/22 08/13/22  [name], [name]  Semaglutide-Weight Management 1 MG/0.5ML SOAJ Inject 1 mg into the skin once a week for 28 days. 07/13/22 08/10/22  [name], [name]  Semaglutide-Weight Management 1.7 MG/0.75ML SOAJ Inject 1.7 mg into the skin once a week for 28 days. 08/11/22 09/08/22  [name], [name]  Semaglutide-Weight Management 2.4 MG/0.75ML SOAJ Inject 2.4 mg into the skin once a week. 09/09/22 11/04/22  [name], [name]  valsartan (DIOVAN) 160 MG tablet Take 1 tablet (160 mg total) by mouth daily. 05/16/22 05/16/23  [name],  [name]  Vitamin D, Ergocalciferol, (DRISDOL) 1.25 MG (50000 UNIT) CAPS capsule Take 1 capsule (50,000 Units total) by mouth 2 (two) times a week. 02/21/22   [name], [name]      Allergies    Patient has no known allergies.    Review of Systems   Review of Systems  Constitutional: Negative.   HENT:  Positive for congestion and rhinorrhea.   Respiratory:  Positive for cough. Negative for shortness of breath, wheezing and stridor.   Cardiovascular: Negative.   Gastrointestinal: Negative.   Genitourinary: Negative.   Musculoskeletal: Negative.   Skin: Negative.   Neurological: Negative.   All other systems reviewed and are negative.   Physical Exam Updated Vital Signs BP (!) 148/82 (BP Location: Left Arm)   Pulse 79   Temp 98.6 F (37 C) (Oral)   Resp 19   Ht '5\' 4"'$  (1.626 m)   Wt 96.2 kg   SpO2 97%   BMI 36.39 kg/m  Physical Exam Vitals and nursing note reviewed.  Constitutional:      General: She is not in acute distress.    Appearance: She is well-developed. She is not ill-appearing, toxic-appearing or diaphoretic.  HENT:     Head: Normocephalic and atraumatic.     Nose: Rhinorrhea present.     Mouth/Throat:     Mouth: Mucous membranes are moist.  Eyes:     Pupils: Pupils are equal, round, and reactive  to light.  Cardiovascular:     Rate and Rhythm: Normal rate.     Pulses: Normal pulses.     Heart sounds: Normal heart sounds.  Pulmonary:     Effort: Pulmonary effort is normal. No respiratory distress.     Breath sounds: Normal breath sounds.  Abdominal:     General: Bowel sounds are normal. There is no distension.     Palpations: Abdomen is soft.  Musculoskeletal:        General: Normal range of motion.     Cervical back: Normal range of motion.  Skin:    General: Skin is warm and dry.  Neurological:     General: No focal deficit present.     Mental Status: She is alert.  Psychiatric:        Mood and Affect: Mood normal.     ED Results / Procedures /  Treatments   Labs (all labs ordered are listed, but only abnormal results are displayed) Labs Reviewed  RESP PANEL BY RT-PCR (FLU A&B, COVID) ARPGX2 - Abnormal; Notable for the following components:      Result Value   SARS Coronavirus 2 by RT PCR POSITIVE (*)    All other components within normal limits    EKG None  Radiology No results found.  Procedures Procedures    Medications Ordered in ED Medications - No data to display  ED Course/ Medical Decision Making/ A&P    56 year old here for evaluation of cough and congestion over the last 2 days.  She appears otherwise well, clinically hydrated.  Her heart and lungs are clear.  She has stable vital signs.  I reviewed her labs here.  She is COVID-positive.  This is likely the source of her symptoms.  Discussed antiviral treatment.  She is agreeable to this.  Will write for other medications to help with her symptoms.  She will follow-up with her PCP return for new or worsening symptoms.  The patient has been appropriately medically screened and/or stabilized in the ED. I have low suspicion for any other emergent medical condition which would require further screening, evaluation or treatment in the ED or require inpatient management.  Patient is hemodynamically stable and in no acute distress.  Patient able to ambulate in department prior to ED.  Evaluation does not show acute pathology that would require ongoing or additional emergent interventions while in the emergency department or further inpatient treatment.  I have discussed the diagnosis with the patient and answered all questions.  Pain is been managed while in the emergency department and patient has no further complaints prior to discharge.  Patient is comfortable with plan discussed in room and is stable for discharge at this time.  I have discussed strict return precautions for returning to the emergency department.  Patient was encouraged to follow-up with PCP/specialist  refer to at discharge.                           Medical Decision Making Amount and/or Complexity of Data Reviewed External Data Reviewed: labs, radiology and notes. Labs: ordered. Decision-making details documented in ED Course.  Risk OTC drugs. Prescription drug management. Decision regarding hospitalization. Diagnosis or treatment significantly limited by social determinants of health.           Final Clinical Impression(s) / ED Diagnoses Final diagnoses:  COVID    Rx / DC Orders ED Discharge Orders  Ordered    molnupiravir EUA (LAGEVRIO) 200 mg CAPS capsule  2 times daily        07/17/22 1719    benzonatate (TESSALON) 100 MG capsule  Every 8 hours        07/17/22 1719    fluticasone (FLONASE) 50 MCG/ACT nasal spray  Daily        07/17/22 1719              Caelan Atchley A, PA-C 07/17/22 [place], Ankit, MD 07/18/22 1644

## 2022-07-19 ENCOUNTER — Encounter: Payer: Self-pay | Admitting: Nurse Practitioner

## 2022-07-22 ENCOUNTER — Telehealth: Payer: Self-pay

## 2022-07-22 NOTE — Telephone Encounter (Signed)
Transition Care Management Unsuccessful Follow-up Telephone Call  Date of discharge and from where:  [facility] 07/17/2022   Attempts:  1st Attempt  Reason for unsuccessful TCM follow-up call:  Left voice message

## 2022-07-24 ENCOUNTER — Other Ambulatory Visit: Payer: Self-pay | Admitting: Nurse Practitioner

## 2022-07-24 ENCOUNTER — Other Ambulatory Visit (HOSPITAL_BASED_OUTPATIENT_CLINIC_OR_DEPARTMENT_OTHER): Payer: Self-pay

## 2022-07-24 ENCOUNTER — Other Ambulatory Visit (HOSPITAL_COMMUNITY): Payer: Self-pay

## 2022-07-24 DIAGNOSIS — E6609 Other obesity due to excess calories: Secondary | ICD-10-CM

## 2022-07-24 MED ORDER — NALTREXONE-BUPROPION HCL ER 8-90 MG PO TB12
ORAL_TABLET | ORAL | 1 refills | Status: DC
  Filled 2022-07-24: qty 120, 47d supply, fill #0

## 2022-10-03 ENCOUNTER — Ambulatory Visit (INDEPENDENT_AMBULATORY_CARE_PROVIDER_SITE_OTHER): Payer: BLUE CROSS/BLUE SHIELD | Admitting: Nurse Practitioner

## 2022-10-03 ENCOUNTER — Other Ambulatory Visit (HOSPITAL_COMMUNITY): Payer: Self-pay

## 2022-10-03 ENCOUNTER — Other Ambulatory Visit: Payer: Self-pay

## 2022-10-03 ENCOUNTER — Encounter: Payer: Self-pay | Admitting: Nurse Practitioner

## 2022-10-03 VITALS — BP 130/78 | HR 83 | Temp 98.7°F | Ht 64.0 in | Wt 215.0 lb

## 2022-10-03 DIAGNOSIS — E6609 Other obesity due to excess calories: Secondary | ICD-10-CM

## 2022-10-03 DIAGNOSIS — I10 Essential (primary) hypertension: Secondary | ICD-10-CM

## 2022-10-03 DIAGNOSIS — Z6836 Body mass index (BMI) 36.0-36.9, adult: Secondary | ICD-10-CM | POA: Diagnosis not present

## 2022-10-03 DIAGNOSIS — Z8616 Personal history of COVID-19: Secondary | ICD-10-CM | POA: Diagnosis not present

## 2022-10-03 DIAGNOSIS — Z2821 Immunization not carried out because of patient refusal: Secondary | ICD-10-CM

## 2022-10-03 MED ORDER — VALSARTAN 160 MG PO TABS
160.0000 mg | ORAL_TABLET | Freq: Every day | ORAL | 1 refills | Status: DC
  Filled 2022-10-03 – 2022-12-05 (×2): qty 90, 90d supply, fill #0

## 2022-10-03 NOTE — Progress Notes (Signed)
I,Tianna Badgett,acting as a Education administrator for [employer], [name].,have documented all relevant documentation on the behalf of [name], [name],as directed by  [name], [name] while in the presence of [name], [place].  Subjective:     Patient ID: Linda [name] , female    DOB: November 23, 1965 , 56 y.o.   MRN: [id number]   Chief Complaint  Patient presents with   Weight Check    HPI  Patient presents today for weight check.  She was unable to get [facility] due to being on [employer]. She is not currently exercising regularly. She does not eat the right foods when she does eat. She had oatmeal, dr pepper and chicken soup. She plans to go home and put chicken in air fryer with vegetables.   Reports this is the heaviest weight she has been and lowest was 165 less than 10 years ago.   Wt Readings from Last 3 Encounters: 10/03/22 : 215 lb (97.5 kg) 07/17/22 : 212 lb (96.2 kg) 07/15/22 : 212 lb 3.2 oz (96.3 kg)  She tosses and turns all night. In last 7-8 years she has not slept well. She does not feel rested when waking up in morning. She goes to bed at 9p and wakes up at 630p will hit snooze.        Past Medical History:  Diagnosis Date   Anemia    Hypertension    Pre-diabetes 02/2022     Family History  Problem Relation Age of Onset   Memory loss Mother    Hypertension Father    Hypertension Brother    Diabetes Maternal Grandmother    Hypertension Maternal Grandmother    Hypertension Maternal Grandfather    Colon cancer Neg Hx    Stomach cancer Neg Hx    Esophageal cancer Neg Hx      Current Outpatient Medications:    benzonatate (TESSALON) 100 MG capsule, Take 1 capsule (100 mg total) by mouth every 8 (eight) hours., Disp: 21 capsule, Rfl: 0   ferrous sulfate 325 (65 FE) MG tablet, Take 1 tablet (325 mg total) by mouth daily., Disp: 30 tablet, Rfl: 0   fluticasone (FLONASE) 50 MCG/ACT nasal spray, Place 2 sprays into both nostrils daily., Disp: 9.9 mL, Rfl: 0    metFORMIN (GLUCOPHAGE) 500 MG tablet, Take 1 tablet (500 mg total) by mouth 2 (two) times daily with a meal., Disp: 180 tablet, Rfl: 1   valsartan (DIOVAN) 160 MG tablet, Take 1 tablet (160 mg total) by mouth daily., Disp: 90 tablet, Rfl: 1   Vitamin D, Ergocalciferol, (DRISDOL) 1.25 MG (50000 UNIT) CAPS capsule, Take 1 capsule (50,000 Units total) by mouth 2 (two) times a week., Disp: 24 capsule, Rfl: 1   No Known Allergies   Review of Systems  Constitutional: Negative.   Respiratory: Negative.    Cardiovascular: Negative.   Gastrointestinal: Negative.   Endocrine: Negative for polydipsia, polyphagia and polyuria.  Neurological: Negative.   Psychiatric/Behavioral: Negative.       Today's Vitals   10/03/22 1526  BP: 130/78  Pulse: 83  Temp: 98.7 F (37.1 C)  TempSrc: Oral  Weight: 215 lb (97.5 kg)  Height: '5\' 4"'$  (1.626 m)   Body mass index is 36.9 kg/m.  Wt Readings from Last 3 Encounters:  10/03/22 215 lb (97.5 kg)  07/17/22 212 lb (96.2 kg)  07/15/22 212 lb 3.2 oz (96.3 kg)    Objective:  Physical Exam Constitutional:      General: She is not  in acute distress.    Appearance: Normal appearance.  Cardiovascular:     Rate and Rhythm: Normal rate and regular rhythm.     Pulses: Normal pulses.     Heart sounds: Normal heart sounds. No murmur heard. Pulmonary:     Effort: Pulmonary effort is normal. No respiratory distress.     Breath sounds: Normal breath sounds. No wheezing.  Neurological:     Mental Status: She is alert.         Assessment And Plan:     1. Essential hypertension Comments: Blood pressure is fairly controlled.  Continue current medications.  2. Class 2 obesity due to excess calories without serious comorbidity with body mass index (BMI) of 36.0 to 36.9 in adult Encouraged to focus on healthy diet and regular exercise.  Encouraged to be consistent for better outcomes. She is encouraged to strive for BMI less than 30 to decrease cardiac risk.  Advised to aim for at least 150 minutes of exercise per week. She may need a sleep study done  3. History of COVID-19  4. Herpes zoster vaccination declined She is encouraged to strive for BMI less than 30 to decrease cardiac risk. Advised to aim for at least 150 minutes of exercise per week.     Patient was given opportunity to ask questions. Patient verbalized understanding of the plan and was able to repeat key elements of the plan. All questions were answered to their satisfaction.  [name], [name]   I, [name], [name], have reviewed all documentation for this visit. The documentation on 10/03/22 for the exam, diagnosis, procedures, and orders are all accurate and complete.   IF YOU HAVE BEEN REFERRED TO A SPECIALIST, IT MAY TAKE 1-2 WEEKS TO SCHEDULE/PROCESS THE REFERRAL. IF YOU HAVE NOT HEARD FROM US/SPECIALIST IN TWO WEEKS, PLEASE GIVE [place] A CALL AT [phone].   THE PATIENT IS ENCOURAGED TO PRACTICE SOCIAL DISTANCING DUE TO THE COVID-19 PANDEMIC.

## 2022-10-03 NOTE — Patient Instructions (Signed)
Obesity, Adult ?Obesity is having too much body fat. Being obese means that your weight is more than what is healthy for you.  ?BMI (body mass index) is a number that explains how much body fat you have. If you have a BMI of 30 or more, you are obese. ?Obesity can cause serious health problems, such as: ?Stroke. ?Coronary artery disease (CAD). ?Type 2 diabetes. ?Some types of cancer. ?High blood pressure (hypertension). ?High cholesterol. ?Gallbladder stones. ?Obesity can also contribute to: ?Osteoarthritis. ?Sleep apnea. ?Infertility problems. ?What are the causes? ?Eating meals each day that are high in calories, sugar, and fat. ?Drinking a lot of drinks that have sugar in them. ?Being born with genes that may make you more likely to become obese. ?Having a medical condition that causes obesity. ?Taking certain medicines. ?Sitting a lot (having a sedentary lifestyle). ?Not getting enough sleep. ?What increases the risk? ?Having a family history of obesity. ?Living in an area with limited access to: ?Parks, recreation centers, or sidewalks. ?Healthy food choices, such as grocery stores and farmers' markets. ?What are the signs or symptoms? ?The main sign is having too much body fat. ?How is this treated? ?Treatment for this condition often includes changing your lifestyle. Treatment may include: ?Changing your diet. This may include making a healthy meal plan. ?Exercise. This may include activity that causes your heart to beat faster (aerobic exercise) and strength training. Work with your doctor to design a program that works for you. ?Medicine to help you lose weight. This may be used if you are not able to lose one pound a week after 6 weeks of healthy eating and more exercise. ?Treating conditions that cause the obesity. ?Surgery. Options may include gastric banding and gastric bypass. This may be done if: ?Other treatments have not helped to improve your condition. ?You have a BMI of 40 or higher. ?You have  life-threatening health problems related to obesity. ?Follow these instructions at home: ?Eating and drinking ? ?Follow advice from your doctor about what to eat and drink. Your doctor may tell you to: ?Limit fast food, sweets, and processed snack foods. ?Choose low-fat options. For example, choose low-fat milk instead of whole milk. ?Eat five or more servings of fruits or vegetables each day. ?Eat at home more often. This gives you more control over what you eat. ?Choose healthy foods when you eat out. ?Learn to read food labels. This will help you learn how much food is in one serving. ?Keep low-fat snacks available. ?Avoid drinks that have a lot of sugar in them. These include soda, fruit juice, iced tea with sugar, and flavored milk. ?Drink enough water to keep your pee (urine) pale yellow. ?Do not go on fad diets. ?Physical activity ?Exercise often, as told by your doctor. Most adults should get up to 150 minutes of moderate-intensity exercise every week.Ask your doctor: ?What types of exercise are safe for you. ?How often you should exercise. ?Warm up and stretch before being active. ?Do slow stretching after being active (cool down). ?Rest between times of being active. ?Lifestyle ?Work with your doctor and a food expert (dietitian) to set a weight-loss goal that is best for you. ?Limit your screen time. ?Find ways to reward yourself that do not involve food. ?Do not drink alcohol if: ?Your doctor tells you not to drink. ?You are pregnant, may be pregnant, or are planning to become pregnant. ?If you drink alcohol: ?Limit how much you have to: ?0-1 drink a day for women. ?0-2 drinks   a day for men. ?Know how much alcohol is in your drink. In the [place] one drink equals one 12 oz bottle of beer (355 mL), one 5 oz glass of wine (148 mL), or one 1? oz glass of hard liquor (44 mL). ?General instructions ?Keep a weight-loss journal. This can help you keep track of: ?The food that you eat. ?How much exercise you  get. ?Take over-the-counter and prescription medicines only as told by your doctor. ?Take vitamins and supplements only as told by your doctor. ?Think about joining a support group. ?Pay attention to your mental health as obesity can lead to depression or self esteem issues. ?Keep all follow-up visits. ?Contact a doctor if: ?You cannot meet your weight-loss goal after you have changed your diet and lifestyle for 6 weeks. ?You are having trouble breathing. ?Summary ?Obesity is having too much body fat. ?Being obese means that your weight is more than what is healthy for you. ?Work with your doctor to set a weight-loss goal. ?Get regular exercise as told by your doctor. ?This information is not intended to replace advice given to you by your health care provider. Make sure you discuss any questions you have with your health care provider. ?Document Revised: 05/15/2021 Document Reviewed: 05/15/2021 ?Elsevier Patient Education ? 2023 Elsevier Inc. ? ?

## 2022-10-04 ENCOUNTER — Other Ambulatory Visit (HOSPITAL_COMMUNITY): Payer: Self-pay

## 2022-10-18 ENCOUNTER — Other Ambulatory Visit (HOSPITAL_COMMUNITY): Payer: Self-pay

## 2022-10-29 ENCOUNTER — Encounter (HOSPITAL_BASED_OUTPATIENT_CLINIC_OR_DEPARTMENT_OTHER): Payer: Self-pay | Admitting: Emergency Medicine

## 2022-10-29 ENCOUNTER — Other Ambulatory Visit (HOSPITAL_BASED_OUTPATIENT_CLINIC_OR_DEPARTMENT_OTHER): Payer: Self-pay

## 2022-10-29 ENCOUNTER — Emergency Department (HOSPITAL_BASED_OUTPATIENT_CLINIC_OR_DEPARTMENT_OTHER): Payer: BC Managed Care – PPO

## 2022-10-29 ENCOUNTER — Emergency Department (HOSPITAL_BASED_OUTPATIENT_CLINIC_OR_DEPARTMENT_OTHER)
Admission: EM | Admit: 2022-10-29 | Discharge: 2022-10-29 | Disposition: A | Discharge status: Home / Self Care | Payer: BC Managed Care – PPO | Attending: Emergency Medicine | Admitting: Emergency Medicine

## 2022-10-29 ENCOUNTER — Other Ambulatory Visit: Payer: Self-pay

## 2022-10-29 DIAGNOSIS — R109 Unspecified abdominal pain: Secondary | ICD-10-CM | POA: Diagnosis not present

## 2022-10-29 DIAGNOSIS — R8289 Other abnormal findings on cytological and histological examination of urine: Secondary | ICD-10-CM | POA: Insufficient documentation

## 2022-10-29 DIAGNOSIS — D72829 Elevated white blood cell count, unspecified: Secondary | ICD-10-CM | POA: Diagnosis not present

## 2022-10-29 DIAGNOSIS — R197 Diarrhea, unspecified: Secondary | ICD-10-CM | POA: Diagnosis not present

## 2022-10-29 DIAGNOSIS — R1013 Epigastric pain: Secondary | ICD-10-CM | POA: Insufficient documentation

## 2022-10-29 DIAGNOSIS — R112 Nausea with vomiting, unspecified: Secondary | ICD-10-CM | POA: Insufficient documentation

## 2022-10-29 DIAGNOSIS — Z7984 Long term (current) use of oral hypoglycemic drugs: Secondary | ICD-10-CM | POA: Insufficient documentation

## 2022-10-29 DIAGNOSIS — E860 Dehydration: Secondary | ICD-10-CM | POA: Diagnosis not present

## 2022-10-29 DIAGNOSIS — D649 Anemia, unspecified: Secondary | ICD-10-CM | POA: Diagnosis not present

## 2022-10-29 DIAGNOSIS — R1011 Right upper quadrant pain: Secondary | ICD-10-CM | POA: Insufficient documentation

## 2022-10-29 DIAGNOSIS — R1012 Left upper quadrant pain: Secondary | ICD-10-CM | POA: Diagnosis not present

## 2022-10-29 DIAGNOSIS — I10 Essential (primary) hypertension: Secondary | ICD-10-CM | POA: Insufficient documentation

## 2022-10-29 DIAGNOSIS — R1084 Generalized abdominal pain: Secondary | ICD-10-CM | POA: Diagnosis not present

## 2022-10-29 LAB — CBC WITH DIFFERENTIAL/PLATELET
Abs Immature Granulocytes: 0.05 10*3/uL (ref 0.00–0.07)
Basophils Absolute: 0 10*3/uL (ref 0.0–0.1)
Basophils Relative: 0 %
Eosinophils Absolute: 0 10*3/uL (ref 0.0–0.5)
Eosinophils Relative: 0 %
HCT: 31.1 % — ABNORMAL LOW (ref 36.0–46.0)
Hemoglobin: 9.3 g/dL — ABNORMAL LOW (ref 12.0–15.0)
Immature Granulocytes: 1 %
Lymphocytes Relative: 14 %
Lymphs Abs: 1.6 10*3/uL (ref 0.7–4.0)
MCH: 23.3 pg — ABNORMAL LOW (ref 26.0–34.0)
MCHC: 29.9 g/dL — ABNORMAL LOW (ref 30.0–36.0)
MCV: 77.8 fL — ABNORMAL LOW (ref 80.0–100.0)
Monocytes Absolute: 0.5 10*3/uL (ref 0.1–1.0)
Monocytes Relative: 5 %
Neutro Abs: 8.9 10*3/uL — ABNORMAL HIGH (ref 1.7–7.7)
Neutrophils Relative %: 80 %
Platelets: 429 10*3/uL — ABNORMAL HIGH (ref 150–400)
RBC: 4 MIL/uL (ref 3.87–5.11)
RDW: 16.6 % — ABNORMAL HIGH (ref 11.5–15.5)
WBC: 11.1 10*3/uL — ABNORMAL HIGH (ref 4.0–10.5)
nRBC: 0 % (ref 0.0–0.2)

## 2022-10-29 LAB — COMPREHENSIVE METABOLIC PANEL
ALT: 10 U/L (ref 0–44)
AST: 16 U/L (ref 15–41)
Albumin: 4.4 g/dL (ref 3.5–5.0)
Alkaline Phosphatase: 83 U/L (ref 38–126)
Anion gap: 11 (ref 5–15)
BUN: 8 mg/dL (ref 6–20)
CO2: 23 mmol/L (ref 22–32)
Calcium: 9.3 mg/dL (ref 8.9–10.3)
Chloride: 101 mmol/L (ref 98–111)
Creatinine, Ser: 0.79 mg/dL (ref 0.44–1.00)
GFR, Estimated: >60 mL/min (ref >60)
Glucose, Bld: 128 mg/dL — ABNORMAL HIGH (ref 70–99)
Potassium: 3.9 mmol/L (ref 3.5–5.1)
Sodium: 135 mmol/L (ref 135–145)
Total Bilirubin: 0.5 mg/dL (ref 0.3–1.2)
Total Protein: 8.6 g/dL — ABNORMAL HIGH (ref 6.5–8.1)

## 2022-10-29 LAB — URINALYSIS, ROUTINE W REFLEX MICROSCOPIC
Bacteria, UA: NONE SEEN
Bilirubin Urine: NEGATIVE
Glucose, UA: NEGATIVE mg/dL
Ketones, ur: NEGATIVE mg/dL
Leukocytes,Ua: NEGATIVE
Nitrite: NEGATIVE
Protein, ur: 30 mg/dL — AB
Specific Gravity, Urine: 1.017 (ref 1.005–1.030)
pH: 7 (ref 5.0–8.0)

## 2022-10-29 LAB — LIPASE, BLOOD: Lipase: 15 U/L (ref 11–51)

## 2022-10-29 MED ORDER — ONDANSETRON HCL 4 MG/2ML IJ SOLN
4.0000 mg | Freq: Once | INTRAMUSCULAR | Status: AC
  Administered 2022-10-29: 4 mg via INTRAVENOUS
  Filled 2022-10-29: qty 2

## 2022-10-29 MED ORDER — KETOROLAC TROMETHAMINE 30 MG/ML IJ SOLN
30.0000 mg | Freq: Once | INTRAMUSCULAR | Status: AC
Start: 2022-10-29 — End: 2022-10-29
  Administered 2022-10-29: 30 mg via INTRAVENOUS
  Filled 2022-10-29: qty 1

## 2022-10-29 MED ORDER — ONDANSETRON 4 MG PO TBDP
4.0000 mg | ORAL_TABLET | Freq: Three times a day (TID) | ORAL | 0 refills | Status: DC | PRN
  Filled 2022-10-29: qty 20, 7d supply, fill #0
  Filled 2022-10-29: qty 9, 3d supply, fill #0

## 2022-10-29 MED ORDER — SODIUM CHLORIDE 0.9 % IV BOLUS
1000.0000 mL | Freq: Once | INTRAVENOUS | Status: AC
  Administered 2022-10-29: 1000 mL via INTRAVENOUS

## 2022-10-29 NOTE — ED Provider Notes (Signed)
[facility] EMERGENCY DEPT Provider Note   CSN: [id number] Arrival date & time: 10/29/22  0725     History {Add pertinent medical, surgical, social history, OB history to HPI:1} Chief Complaint  Patient presents with   Abdominal Pain    Linda [name] is a 57 y.o. female.  Pt is a 57 yo female with a pmhx significant for obesity, anemia, and htn.  Pt presents to the ED today with abd pain.  Sx started last night around 8pm.  She did vomit, but no longer feels nauseous.  No urinary sx.  No fevers.  Her only abd surgery was a csxn.  She no longer has periods.       Home Medications Prior to Admission medications   Medication Sig Start Date End Date Taking? Authorizing Provider  benzonatate (TESSALON) 100 MG capsule Take 1 capsule (100 mg total) by mouth every 8 (eight) hours. 07/17/22   Henderly, Britni A, PA-C  ferrous sulfate 325 (65 FE) MG tablet Take 1 tablet (325 mg total) by mouth daily. [birth year]57   Delora Fuel, MD  fluticasone [facility]) 50 MCG/ACT nasal spray Place 2 sprays into both nostrils daily. 07/17/22   Henderly, Britni A, PA-C  metFORMIN (GLUCOPHAGE) 500 MG tablet Take 1 tablet (500 mg total) by mouth 2 (two) times daily with a meal. 05/17/22   [name], [name]  valsartan (DIOVAN) 160 MG tablet Take 1 tablet (160 mg total) by mouth daily. 10/03/22 10/03/23  [name], [name]  Vitamin D, Ergocalciferol, (DRISDOL) 1.25 MG (50000 UNIT) CAPS capsule Take 1 capsule (50,000 Units total) by mouth 2 (two) times a week. 02/21/22   [name], [name]      Allergies    Patient has no known allergies.    Review of Systems   Review of Systems  Gastrointestinal:  Positive for abdominal pain, diarrhea, nausea and vomiting.  All other systems reviewed and are negative.   Physical Exam Updated Vital Signs BP (!) 178/87 (BP Location: Right Arm)   Pulse 90   Temp 98.3 F (36.8 C)   Resp 20   Ht '5\' 4"'$  (1.626 m)   Wt 95.7 kg   SpO2 100%   BMI 36.22 kg/m   Physical Exam Vitals and nursing note reviewed.  Constitutional:      Appearance: She is well-developed. She is obese.  HENT:     Head: Normocephalic and atraumatic.     Mouth/Throat:     Mouth: Mucous membranes are moist.     Pharynx: Oropharynx is clear.  Eyes:     Extraocular Movements: Extraocular movements intact.     Pupils: Pupils are equal, round, and reactive to light.  Cardiovascular:     Rate and Rhythm: Normal rate and regular rhythm.  Pulmonary:     Effort: Pulmonary effort is normal.     Breath sounds: Normal breath sounds.  Abdominal:     General: Abdomen is flat. Bowel sounds are normal.     Palpations: Abdomen is soft.     Tenderness: There is abdominal tenderness in the right upper quadrant, epigastric area and left upper quadrant.  Skin:    General: Skin is warm.     Capillary Refill: Capillary refill takes less than 2 seconds.  Neurological:     General: No focal deficit present.     Mental Status: She is alert and oriented to person, place, and time.  Psychiatric:        Mood and Affect: Mood normal.  Behavior: Behavior normal.     ED Results / Procedures / Treatments   Labs (all labs ordered are listed, but only abnormal results are displayed) Labs Reviewed  CBC WITH DIFFERENTIAL/PLATELET - Abnormal; Notable for the following components:      Result Value   WBC 11.1 (*)    Hemoglobin 9.3 (*)    HCT 31.1 (*)    MCV 77.8 (*)    MCH 23.3 (*)    MCHC 29.9 (*)    RDW 16.6 (*)    Platelets 429 (*)    Neutro Abs 8.9 (*)    All other components within normal limits  URINALYSIS, ROUTINE W REFLEX MICROSCOPIC - Abnormal; Notable for the following components:   Hgb urine dipstick TRACE (*)    Protein, ur 30 (*)    All other components within normal limits  COMPREHENSIVE METABOLIC PANEL  LIPASE, BLOOD    EKG None  Radiology No results found.  Procedures Procedures  {Document cardiac monitor, telemetry assessment procedure when  appropriate:1}  Medications Ordered in ED Medications  ketorolac (TORADOL) 30 MG/ML injection 30 mg (30 mg Intravenous Given 10/29/22 0755)  sodium chloride 0.9 % bolus 1,000 mL (1,000 mLs Intravenous New Bag/Given 10/29/22 0755)  ondansetron (ZOFRAN) injection 4 mg (4 mg Intravenous Given 10/29/22 0755)    ED Course/ Medical Decision Making/ A&P                           Medical Decision Making Amount and/or Complexity of Data Reviewed Labs: ordered.  Risk Prescription drug management.   This patient presents to the ED for concern of abd pain, this involves an extensive number of treatment options, and is a complaint that carries with it a high risk of complications and morbidity.  The differential diagnosis includes pancreatitis, cholecystitis, gastritis, gastoenteritis, kidney stone   Co morbidities that complicate the patient evaluation  Obesity, anemia, and htn   Additional history obtained:  Additional history obtained from epic chart review    Lab Tests:  I Ordered, and personally interpreted labs.  The pertinent results include:  cbc with wbc 11.1, hgb 9.3 (chronic), plt 429; ua with tr hgb and protein   Imaging Studies ordered:  I ordered imaging studies including ***  I independently visualized and interpreted imaging which showed *** I agree with the radiologist interpretation   Cardiac Monitoring:  The patient was maintained on a cardiac monitor.  I personally viewed and interpreted the cardiac monitored which showed an underlying rhythm of: ***   Medicines ordered and prescription drug management:  I ordered medication including ***  for ***  Reevaluation of the patient after these medicines showed that the patient {resolved/improved/worsened:23923::"improved"} I have reviewed the patients home medicines and have made adjustments as needed   Test Considered:  ***   Critical Interventions:  ***   Consultations Obtained:  I requested  consultation with the ***,  and discussed lab and imaging findings as well as pertinent plan - they recommend: ***   Problem List / ED Course:  ***   Reevaluation:  After the interventions noted above, I reevaluated the patient and found that they have :{resolved/improved/worsened:23923::"improved"}   Social Determinants of Health:  ***   Dispostion:  After consideration of the diagnostic results and the patients response to treatment, I feel that the patent would benefit from ***.    {Document critical care time when appropriate:1} {Document review of labs and clinical decision tools ie  heart score, Chads2Vasc2 etc:1}  {Document your independent review of radiology images, and any outside records:1} {Document your discussion with family members, caretakers, and with consultants:1} {Document social determinants of health affecting pt's care:1} {Document your decision making why or why not admission, treatments were needed:1} Final Clinical Impression(s) / ED Diagnoses Final diagnoses:  None    Rx / DC Orders ED Discharge Orders     None

## 2022-10-29 NOTE — ED Notes (Signed)
To US

## 2022-10-29 NOTE — ED Triage Notes (Signed)
Left sided abd pain with 1 vomiting and nausea and diarrhea last night  denies dysuria

## 2022-10-29 NOTE — Discharge Instructions (Addendum)
Return if symptoms worsen

## 2022-10-30 ENCOUNTER — Telehealth: Payer: Self-pay

## 2022-10-30 NOTE — Telephone Encounter (Signed)
Transition Care Management Unsuccessful Follow-up Telephone Call  Date of discharge and from where:  10/29/2022 [facility]   Attempts:  1st Attempt  Reason for unsuccessful TCM follow-up call:  Left voice message

## 2022-10-31 ENCOUNTER — Telehealth: Payer: Self-pay

## 2022-10-31 NOTE — Telephone Encounter (Signed)
Transition Care Management Unsuccessful Follow-up Telephone Call  Date of discharge and from where:  10/29/2022 [facility]     Attempts:  2nd Attempt  Reason for unsuccessful TCM follow-up call:  Left voice message

## 2022-12-05 ENCOUNTER — Other Ambulatory Visit (HOSPITAL_COMMUNITY): Payer: Self-pay

## 2022-12-05 ENCOUNTER — Encounter: Payer: Self-pay | Admitting: Nurse Practitioner

## 2022-12-05 ENCOUNTER — Ambulatory Visit: Payer: BC Managed Care – PPO | Admitting: Nurse Practitioner

## 2022-12-05 ENCOUNTER — Other Ambulatory Visit: Payer: Self-pay

## 2022-12-05 VITALS — BP 160/70 | HR 85 | Temp 97.6°F | Ht 64.0 in | Wt 208.0 lb

## 2022-12-05 DIAGNOSIS — R7309 Other abnormal glucose: Secondary | ICD-10-CM

## 2022-12-05 DIAGNOSIS — I10 Essential (primary) hypertension: Secondary | ICD-10-CM | POA: Insufficient documentation

## 2022-12-05 DIAGNOSIS — Z6835 Body mass index (BMI) 35.0-35.9, adult: Secondary | ICD-10-CM

## 2022-12-05 DIAGNOSIS — R790 Abnormal level of blood mineral: Secondary | ICD-10-CM

## 2022-12-05 DIAGNOSIS — Z2821 Immunization not carried out because of patient refusal: Secondary | ICD-10-CM

## 2022-12-05 NOTE — Progress Notes (Signed)
I,Sheena H Holbrook,acting as a Education administrator for [name], [name].,have documented all relevant documentation on the behalf of [name], [name],as directed by  [name], [name] while in the presence of [name], [place].    Subjective:     Patient ID: Linda [name] , female    DOB: 08/22/1966 , 57 y.o.   MRN: [id number]   Chief Complaint  Patient presents with   Weight Check    HPI  Patient presents today for weight check. Patient has no other complaints or concerns. She has not been taking her blood pressure medications for the last couple weeks - 2 weeks at least. She has been out of town caring for her mother who had a triple bypass. She has not been exercising regularly. She is not eating much and not eating as healthy as she should. She is going back on 12/10/2022. Her mother has a blood pressure cuff she can use  Wt Readings from Last 3 Encounters: 12/05/22 : 208 lb (94.3 kg) 10/29/22 : 211 lb (95.7 kg) 10/03/22 : 215 lb (97.5 kg)          Past Medical History:  Diagnosis Date   Anemia    Hypertension    Pre-diabetes 02/2022     Family History  Problem Relation Age of Onset   Memory loss Mother    Hypertension Father    Hypertension Brother    Diabetes Maternal Grandmother    Hypertension Maternal Grandmother    Hypertension Maternal Grandfather    Colon cancer Neg Hx    Stomach cancer Neg Hx    Esophageal cancer Neg Hx      Current Outpatient Medications:    ferrous sulfate 325 (65 FE) MG tablet, Take 1 tablet (325 mg total) by mouth daily., Disp: 30 tablet, Rfl: 0   fluticasone (FLONASE) 50 MCG/ACT nasal spray, Place 2 sprays into both nostrils daily., Disp: 9.9 mL, Rfl: 0   metFORMIN (GLUCOPHAGE) 500 MG tablet, Take 1 tablet (500 mg total) by mouth 2 (two) times daily with a meal., Disp: 180 tablet, Rfl: 1   ondansetron (ZOFRAN-ODT) 4 MG disintegrating tablet, Take 1 tablet (4 mg total) by mouth every 8 (eight) hours as needed for nausea or vomiting.,  Disp: 20 tablet, Rfl: 0   valsartan (DIOVAN) 160 MG tablet, Take 1 tablet (160 mg total) by mouth daily., Disp: 90 tablet, Rfl: 1   Vitamin D, Ergocalciferol, (DRISDOL) 1.25 MG (50000 UNIT) CAPS capsule, Take 1 capsule (50,000 Units total) by mouth 2 (two) times a week., Disp: 24 capsule, Rfl: 1   No Known Allergies   Review of Systems  Constitutional: Negative.   Respiratory: Negative.    Cardiovascular: Negative.   Neurological: Negative.   Psychiatric/Behavioral: Negative.    All other systems reviewed and are negative.    Today's Vitals   12/05/22 1018 12/05/22 1116  BP: (!) 140/100 (!) 160/70  Pulse: 85   Temp: 97.6 F (36.4 C)   TempSrc: Oral   SpO2: 93%   Weight: 208 lb (94.3 kg)   Height: '5\' 4"'$  (1.626 m)    Body mass index is 35.7 kg/m.   Objective:  Physical Exam Vitals reviewed.  Constitutional:      General: She is not in acute distress.    Appearance: Normal appearance. She is obese.  Cardiovascular:     Rate and Rhythm: Normal rate and regular rhythm.     Pulses: Normal pulses.     Heart sounds: Normal heart sounds.  No murmur heard. Pulmonary:     Effort: Pulmonary effort is normal. No respiratory distress.     Breath sounds: Normal breath sounds. No wheezing.  Neurological:     Mental Status: She is alert.         Assessment And Plan:     1. Essential hypertension Comments: Blood pressure is elevated, advised on medication adherence. Will send her readings next week. Limit intake of salt.  2. Low ferritin Comments: Has had low ferritin and seen GI which showed H Pylori, will recheck levels if remains low will refer to Hematology - Iron, TIBC and Ferritin Panel  3. Abnormal glucose Comments: HgbA1c was improved at last visit, continue current medications - Hemoglobin A1c  4. BMI 35.0-35.9,adult She is encouraged to strive for BMI less than 30 to decrease cardiac risk. Advised to aim for at least 150 minutes of exercise per week.  5.  COVID-19 vaccination declined Declines covid 19 vaccine. Discussed risk of covid 89 and if she changes her mind about the vaccine to call the office. Education has been provided regarding the importance of this vaccine but patient still declined. Advised may receive this vaccine at local pharmacy or Health Dept.or vaccine clinic. Aware to provide a copy of the vaccination record if obtained from local pharmacy or Health Dept.  Encouraged to take multivitamin, vitamin d, vitamin c and zinc to increase immune system. Aware can call office if would like to have vaccine here at office. Verbalized acceptance and understanding.  Patient was given opportunity to ask questions. Patient verbalized understanding of the plan and was able to repeat key elements of the plan. All questions were answered to their satisfaction.  [name], [name]   I, [name], [name], have reviewed all documentation for this visit. The documentation on 12/05/22 for the exam, diagnosis, procedures, and orders are all accurate and complete.   IF YOU HAVE BEEN REFERRED TO A SPECIALIST, IT MAY TAKE 1-2 WEEKS TO SCHEDULE/PROCESS THE REFERRAL. IF YOU HAVE NOT HEARD FROM US/SPECIALIST IN TWO WEEKS, PLEASE GIVE [place] A CALL AT [phone].   THE PATIENT IS ENCOURAGED TO PRACTICE SOCIAL DISTANCING DUE TO THE COVID-19 PANDEMIC.

## 2022-12-06 LAB — HEMOGLOBIN A1C
Est. average glucose Bld gHb Est-mCnc: 140 mg/dL
Hgb A1c MFr Bld: 6.5 % — ABNORMAL HIGH (ref 4.8–5.6)

## 2022-12-06 LAB — IRON,TIBC AND FERRITIN PANEL
Ferritin: 26 ng/mL (ref 15–150)
Iron Saturation: 4 % — CL (ref 15–55)
Iron: 15 ug/dL — ABNORMAL LOW (ref 27–159)
Total Iron Binding Capacity: 411 ug/dL (ref 250–450)
UIBC: 396 ug/dL (ref 131–425)

## 2022-12-16 ENCOUNTER — Other Ambulatory Visit (HOSPITAL_COMMUNITY): Payer: Self-pay

## 2022-12-16 DIAGNOSIS — R790 Abnormal level of blood mineral: Secondary | ICD-10-CM | POA: Insufficient documentation

## 2022-12-16 MED ORDER — METFORMIN HCL 500 MG PO TABS
500.0000 mg | ORAL_TABLET | Freq: Two times a day (BID) | ORAL | 1 refills | Status: DC
  Filled 2022-12-16 – 2022-12-17 (×2): qty 180, 90d supply, fill #0

## 2022-12-17 ENCOUNTER — Encounter: Payer: Self-pay | Admitting: Nurse Practitioner

## 2022-12-17 ENCOUNTER — Other Ambulatory Visit: Payer: Self-pay

## 2022-12-17 ENCOUNTER — Other Ambulatory Visit (HOSPITAL_COMMUNITY): Payer: Self-pay

## 2023-01-02 ENCOUNTER — Other Ambulatory Visit: Payer: Self-pay | Admitting: Nurse Practitioner

## 2023-01-02 DIAGNOSIS — E1169 Type 2 diabetes mellitus with other specified complication: Secondary | ICD-10-CM | POA: Insufficient documentation

## 2023-01-02 DIAGNOSIS — E669 Obesity, unspecified: Secondary | ICD-10-CM | POA: Insufficient documentation

## 2023-01-02 MED ORDER — RYBELSUS 7 MG PO TABS
1.0000 | ORAL_TABLET | Freq: Every day | ORAL | 1 refills | Status: DC
  Filled 2023-01-02: qty 90, 90d supply, fill #0

## 2023-01-03 ENCOUNTER — Other Ambulatory Visit (HOSPITAL_COMMUNITY): Payer: Self-pay

## 2023-01-13 ENCOUNTER — Other Ambulatory Visit (HOSPITAL_COMMUNITY): Payer: Self-pay

## 2023-01-13 ENCOUNTER — Other Ambulatory Visit: Payer: Self-pay

## 2023-01-13 DIAGNOSIS — E1169 Type 2 diabetes mellitus with other specified complication: Secondary | ICD-10-CM

## 2023-01-13 MED ORDER — RYBELSUS 7 MG PO TABS
1.0000 | ORAL_TABLET | Freq: Every day | ORAL | 1 refills | Status: DC
  Filled 2023-01-13: qty 90, 90d supply, fill #0
  Filled 2023-01-23: qty 30, 30d supply, fill #0
  Filled 2023-04-03: qty 30, 30d supply, fill #1

## 2023-01-14 ENCOUNTER — Telehealth: Payer: Self-pay

## 2023-01-14 NOTE — Telephone Encounter (Addendum)
Per pharmacy [facility] requires PA.  PA started via [facility] Key: [id number]

## 2023-01-14 NOTE — Telephone Encounter (Signed)
Have attempted to separate PAs and each is sent back with [employer] saying they cannot locate a matching member.  Spoke with patient and she will call [employer] to see if her member ID nbr may have changed or if there is another reason for the issue.

## 2023-01-21 ENCOUNTER — Encounter: Payer: Self-pay | Admitting: Nurse Practitioner

## 2023-01-22 ENCOUNTER — Encounter: Payer: Self-pay | Admitting: Nurse Practitioner

## 2023-01-22 ENCOUNTER — Ambulatory Visit: Payer: BC Managed Care – PPO | Admitting: Nurse Practitioner

## 2023-01-22 VITALS — BP 144/82 | HR 76 | Temp 98.0°F | Ht 64.0 in | Wt 211.0 lb

## 2023-01-22 DIAGNOSIS — E6609 Other obesity due to excess calories: Secondary | ICD-10-CM

## 2023-01-22 DIAGNOSIS — R1084 Generalized abdominal pain: Secondary | ICD-10-CM | POA: Diagnosis not present

## 2023-01-22 DIAGNOSIS — Z6836 Body mass index (BMI) 36.0-36.9, adult: Secondary | ICD-10-CM

## 2023-01-22 DIAGNOSIS — E1169 Type 2 diabetes mellitus with other specified complication: Secondary | ICD-10-CM | POA: Diagnosis not present

## 2023-01-22 DIAGNOSIS — R1111 Vomiting without nausea: Secondary | ICD-10-CM | POA: Diagnosis not present

## 2023-01-22 NOTE — Progress Notes (Addendum)
I,Sheena H Holbrook,acting as a Education administrator for [name], [name].,have documented all relevant documentation on the behalf of [name], [name],as directed by  [name], [name] while in the presence of [name], [place].    Subjective:     Patient ID: Linda [name] , female    DOB: July 26, 1966 , 57 y.o.   MRN: [id number]   Chief Complaint  Patient presents with   Abdominal Pain   Nausea    W/vomiting    HPI  Patient presents today for abdominal pain, nausea and emesis. Symptoms started on Monday, today not as bad but not like it was yesterday. The first time she had these symptoms was in September and again in January. Her stomach will feel bloated. She had blood transfusion. Describes the pain as an ache. Pain goes away on its own.  She does not take a probiotic or eat an increased amounts of spicy foods. Patient reports she has had this twice before and has been seen in the ED with no serious concerns found. Patient reports last emesis was last night. Patient denies fever/chills, urinary or bowel symptoms. Patient has seen GI last year.       Past Medical History:  Diagnosis Date   Anemia    Hypertension    Pre-diabetes 02/2022     Family History  Problem Relation Age of Onset   Memory loss Mother    Hypertension Father    Hypertension Brother    Diabetes Maternal Grandmother    Hypertension Maternal Grandmother    Hypertension Maternal Grandfather    Colon cancer Neg Hx    Stomach cancer Neg Hx    Esophageal cancer Neg Hx      Current Outpatient Medications:    ferrous sulfate 325 (65 FE) MG tablet, Take 1 tablet (325 mg total) by mouth daily., Disp: 30 tablet, Rfl: 0   fluticasone (FLONASE) 50 MCG/ACT nasal spray, Place 2 sprays into both nostrils daily., Disp: 9.9 mL, Rfl: 0   metFORMIN (GLUCOPHAGE) 500 MG tablet, Take 1 tablet (500 mg total) by mouth 2 (two) times daily with a meal., Disp: 180 tablet, Rfl: 1   Semaglutide (RYBELSUS) 7 MG TABS, Take 1 tablet (7 mg  total) by mouth daily., Disp: 90 tablet, Rfl: 1   valsartan (DIOVAN) 160 MG tablet, Take 1 tablet (160 mg total) by mouth daily., Disp: 90 tablet, Rfl: 1   Vitamin D, Ergocalciferol, (DRISDOL) 1.25 MG (50000 UNIT) CAPS capsule, Take 1 capsule (50,000 Units total) by mouth 2 (two) times a week., Disp: 24 capsule, Rfl: 1   No Known Allergies   Review of Systems  Constitutional: Negative.   Respiratory: Negative.    Cardiovascular: Negative.   Gastrointestinal:  Positive for abdominal pain, nausea and vomiting.  Neurological: Negative.   Psychiatric/Behavioral: Negative.    All other systems reviewed and are negative.    Today's Vitals   01/22/23 0933  BP: (!) 144/82  Pulse: 76  Temp: 98 F (36.7 C)  TempSrc: Oral  SpO2: 99%  Weight: 211 lb (95.7 kg)  Height: 5\' 4"  (1.626 m)   Body mass index is 36.22 kg/m.   Objective:  Physical Exam Vitals reviewed.  Constitutional:      General: She is not in acute distress.    Appearance: Normal appearance. She is obese.  Cardiovascular:     Rate and Rhythm: Normal rate and regular rhythm.     Pulses: Normal pulses.     Heart sounds: Normal heart sounds.  No murmur heard. Pulmonary:     Effort: Pulmonary effort is normal. No respiratory distress.     Breath sounds: Normal breath sounds. No wheezing.  Abdominal:     General: Bowel sounds are normal. There is no distension.     Palpations: Abdomen is soft.     Tenderness: There is generalized abdominal tenderness. There is no guarding or rebound.     Comments: rounded  Skin:    General: Skin is warm and dry.  Neurological:     General: No focal deficit present.     Mental Status: She is alert.  Psychiatric:        Mood and Affect: Mood normal. Mood is not anxious.        Behavior: Behavior normal.         Assessment And Plan:     1. Generalized abdominal pain Comments: Tenderness throughout abdomen w/ mild rounding of abdomen, will refer to GI for further evaluation.  Lipase done in January normal. Ultrasound abd done negative - Ambulatory referral to Gastroenterology  2. Vomiting without nausea, unspecified vomiting type Comments: Declines medication for nausea, will refer to GI for further evaluation - Ambulatory referral to Gastroenterology  3. Type 2 diabetes mellitus with obesity Comments: Currently only on metformin. Not on Rybelsus trying to ge approved with insurance. Diabetic foot exam decreased sensation to feet, discussed foot care   Patient was given opportunity to ask questions. Patient verbalized understanding of the plan and was able to repeat key elements of the plan. All questions were answered to their satisfaction.  [name], [name]   I, [name], [name], have reviewed all documentation for this visit. The documentation on 01/22/23 for the exam, diagnosis, procedures, and orders are all accurate and complete.   IF YOU HAVE BEEN REFERRED TO A SPECIALIST, IT MAY TAKE 1-2 WEEKS TO SCHEDULE/PROCESS THE REFERRAL. IF YOU HAVE NOT HEARD FROM US/SPECIALIST IN TWO WEEKS, PLEASE GIVE [place] A CALL AT [phone].   THE PATIENT IS ENCOURAGED TO PRACTICE SOCIAL DISTANCING DUE TO THE COVID-19 PANDEMIC.

## 2023-01-23 ENCOUNTER — Other Ambulatory Visit (HOSPITAL_COMMUNITY): Payer: Self-pay | Admitting: Emergency Medicine

## 2023-01-23 ENCOUNTER — Other Ambulatory Visit: Payer: Self-pay | Admitting: Nurse Practitioner

## 2023-01-23 ENCOUNTER — Other Ambulatory Visit (HOSPITAL_COMMUNITY): Payer: Self-pay

## 2023-01-23 MED ORDER — VITAMIN D (ERGOCALCIFEROL) 1.25 MG (50000 UNIT) PO CAPS
50000.0000 [IU] | ORAL_CAPSULE | ORAL | 1 refills | Status: DC
  Filled 2023-01-23: qty 24, 84d supply, fill #0

## 2023-01-24 ENCOUNTER — Other Ambulatory Visit (HOSPITAL_COMMUNITY): Payer: Self-pay

## 2023-01-24 ENCOUNTER — Encounter: Payer: Self-pay | Admitting: Nurse Practitioner

## 2023-01-27 NOTE — Telephone Encounter (Signed)
This request has been approved using information available on the patient's profile. [id number];Review Type:Prior Auth;Coverage Start Date:12/28/2022;Coverage End Date:01/27/2024;

## 2023-01-27 NOTE — Telephone Encounter (Signed)
Patient sent MyChart message providing updated insurance information.  [employer]  Member ID [id number] [facility] # [id number] PCN # [id number] [employer] # [id number]  New PA started via [facility] with [employer] Key: [id number]

## 2023-01-28 ENCOUNTER — Other Ambulatory Visit: Payer: Self-pay

## 2023-01-29 ENCOUNTER — Other Ambulatory Visit (HOSPITAL_COMMUNITY): Payer: Self-pay

## 2023-01-29 DIAGNOSIS — R140 Abdominal distension (gaseous): Secondary | ICD-10-CM | POA: Diagnosis not present

## 2023-01-29 DIAGNOSIS — R197 Diarrhea, unspecified: Secondary | ICD-10-CM | POA: Diagnosis not present

## 2023-01-29 DIAGNOSIS — R1084 Generalized abdominal pain: Secondary | ICD-10-CM | POA: Diagnosis not present

## 2023-01-31 ENCOUNTER — Other Ambulatory Visit (HOSPITAL_COMMUNITY): Payer: Self-pay

## 2023-02-26 DIAGNOSIS — K5900 Constipation, unspecified: Secondary | ICD-10-CM | POA: Diagnosis not present

## 2023-02-26 DIAGNOSIS — E119 Type 2 diabetes mellitus without complications: Secondary | ICD-10-CM | POA: Diagnosis not present

## 2023-03-05 ENCOUNTER — Ambulatory Visit: Payer: BC Managed Care – PPO | Admitting: Nurse Practitioner

## 2023-03-05 ENCOUNTER — Encounter: Payer: Self-pay | Admitting: Nurse Practitioner

## 2023-03-05 VITALS — BP 138/62 | HR 63 | Temp 98.8°F | Ht 64.0 in | Wt 206.6 lb

## 2023-03-05 DIAGNOSIS — Z2821 Immunization not carried out because of patient refusal: Secondary | ICD-10-CM

## 2023-03-05 DIAGNOSIS — E669 Obesity, unspecified: Secondary | ICD-10-CM

## 2023-03-05 DIAGNOSIS — Z6835 Body mass index (BMI) 35.0-35.9, adult: Secondary | ICD-10-CM

## 2023-03-05 DIAGNOSIS — I10 Essential (primary) hypertension: Secondary | ICD-10-CM | POA: Diagnosis not present

## 2023-03-05 DIAGNOSIS — D508 Other iron deficiency anemias: Secondary | ICD-10-CM | POA: Diagnosis not present

## 2023-03-05 DIAGNOSIS — E1169 Type 2 diabetes mellitus with other specified complication: Secondary | ICD-10-CM | POA: Diagnosis not present

## 2023-03-05 MED ORDER — ACCRUFER 30 MG PO CAPS
1.0000 | ORAL_CAPSULE | Freq: Every day | ORAL | 1 refills | Status: DC
Start: 2023-03-05 — End: 2023-07-16

## 2023-03-05 NOTE — Progress Notes (Signed)
Linda [name],acting as a Neurosurgeon for Linda Felts, [name].,have documented all relevant documentation on the behalf of Linda Felts, [name],as directed by  Linda Felts, [name] while in the presence of Linda Felts, [name].    Subjective:     Patient ID: Linda [name] , female    DOB: 03/10/1966 , 57 y.o.   MRN: [id number]   Chief Complaint  Patient presents with   Hypertension   Diabetes    HPI  Patient presents for a BP and DM follow up. Patient reports compliance with medications and has no other concerns today. Patient reports that GI took her off Iron pills and Metformin. She stopped taking the metformin one month ago. Last week stopped iron.   BP Readings from Last 3 Encounters: 03/05/23 : 138/62 01/22/23 : (!) 144/82 12/05/22 : [name] 160/70       Past Medical History:  Diagnosis Date   Anemia    Hypertension    Pre-diabetes 02/2022     Family History  Problem Relation Age of Onset   Memory loss Mother    Hypertension Father    Hypertension Brother    Diabetes Maternal Grandmother    Hypertension Maternal Grandmother    Hypertension Maternal Grandfather    Colon cancer Neg Hx    Stomach cancer Neg Hx    Esophageal cancer Neg Hx      Current Outpatient Medications:    Ferric Maltol (ACCRUFER) 30 MG CAPS, Take 1 capsule (30 mg total) by mouth daily., Disp: 90 capsule, Rfl: 1   Semaglutide (RYBELSUS) 7 MG TABS, Take 1 tablet (7 mg total) by mouth daily., Disp: 90 tablet, Rfl: 1   valsartan (DIOVAN) 160 MG tablet, Take 1 tablet (160 mg total) by mouth daily., Disp: 90 tablet, Rfl: 1   Vitamin D, Ergocalciferol, (DRISDOL) 1.25 MG (50000 UNIT) CAPS capsule, Take 1 capsule (50,000 Units total) by mouth 2 (two) times a week., Disp: 24 capsule, Rfl: 1   ferrous sulfate 325 (65 FE) MG tablet, Take 1 tablet (325 mg total) by mouth daily. (Patient not taking: Reported on 03/05/2023), Disp: 30 tablet, Rfl: 0   fluticasone (FLONASE) 50 MCG/ACT nasal spray, Place 2 sprays into  both nostrils daily. (Patient not taking: Reported on 03/05/2023), Disp: 9.9 mL, Rfl: 0   metFORMIN (GLUCOPHAGE) 500 MG tablet, Take 1 tablet (500 mg total) by mouth 2 (two) times daily with a meal. (Patient not taking: Reported on 03/05/2023), Disp: 180 tablet, Rfl: 1   No Known Allergies   Review of Systems  Constitutional: Negative.   Respiratory: Negative.    Cardiovascular: Negative.   Neurological: Negative.   Psychiatric/Behavioral: Negative.    All other systems reviewed and are negative.    Today's Vitals   03/05/23 1201  BP: 138/62  Pulse: 63  Temp: 98.8 F (37.1 C)  TempSrc: Oral  Weight: 206 lb 9.6 oz (93.7 kg)  Height: 5\' 4"  (1.626 m)  PainSc: 0-No pain   Body mass index is 35.46 kg/m.  Wt Readings from Last 3 Encounters:  03/05/23 206 lb 9.6 oz (93.7 kg)  01/22/23 211 lb (95.7 kg)  12/05/22 208 lb (94.3 kg)    The 10-year ASCVD risk score (Arnett DK, et al., 2019) is: 18.9%   Values used to calculate the score:     Age: 8 years     Sex: Female     Is Non-Hispanic African American: Yes     Diabetic: Yes     Tobacco smoker: No  Systolic Blood Pressure: 138 mmHg     Is BP treated: Yes     HDL Cholesterol: 48 mg/dL     Total Cholesterol: 234 mg/dL ++ Objective:  Physical Exam Vitals reviewed.  Constitutional:      General: She is not in acute distress.    Appearance: Normal appearance. She is obese.  Cardiovascular:     Rate and Rhythm: Normal rate and regular rhythm.     Pulses: Normal pulses.     Heart sounds: Normal heart sounds. No murmur heard. Pulmonary:     Effort: Pulmonary effort is normal. No respiratory distress.     Breath sounds: Normal breath sounds. No wheezing.  Neurological:     Mental Status: She is alert.         Assessment And Plan:     1. Essential hypertension Comments: Blood pressure is fairly controlled, continue focusing on lifestyle modifications.  2. Type 2 diabetes mellitus with obesity (HCC) Comments: She  is no longer taking metformin due to GI upset, continue Rybelsus. Will start her on Dexcom and see if insurance will cover. No labs this visit.  3. Iron deficiency anemia secondary to inadequate dietary iron intake Comments: Unable to tolerate regular iron was causing constipation, will try accufer - Ferric Maltol (ACCRUFER) 30 MG CAPS; Take 1 capsule (30 mg total) by mouth daily.  Dispense: 90 capsule; Refill: 1  4. Herpes zoster vaccination declined Declines shingrix, educated on disease process and is aware if he changes his mind to notify office  5. COVID-19 vaccination declined  Declines covid 19 vaccine. Discussed risk of covid 42 and if she changes her mind about the vaccine to call the office. Education has been provided regarding the importance of this vaccine but patient still declined. Advised may receive this vaccine at local pharmacy or Health Dept.or vaccine clinic. Aware to provide a copy of the vaccination record if obtained from local pharmacy or Health Dept.  Encouraged to take multivitamin, vitamin d, vitamin c and zinc to increase immune system. Aware can call office if would like to have vaccine here at office. Verbalized acceptance and understanding.   Return for controlled DM check 4 months.  Patient was given opportunity to ask questions. Patient verbalized understanding of the plan and was able to repeat key elements of the plan. All questions were answered to their satisfaction.  Linda Felts, [name]   I, Linda Felts, [name], have reviewed all documentation for this visit. The documentation on 03/05/23 for the exam, diagnosis, procedures, and orders are all accurate and complete.   IF YOU HAVE BEEN REFERRED TO A SPECIALIST, IT MAY TAKE 1-2 WEEKS TO SCHEDULE/PROCESS THE REFERRAL. IF YOU HAVE NOT HEARD FROM US/SPECIALIST IN TWO WEEKS, PLEASE GIVE [place] A CALL AT [phone].   THE PATIENT IS ENCOURAGED TO PRACTICE SOCIAL DISTANCING DUE TO THE COVID-19 PANDEMIC.

## 2023-03-05 NOTE — Patient Instructions (Signed)
Hypertension, Adult ?Hypertension is another name for high blood pressure. High blood pressure forces your heart to work harder to pump blood. This can cause problems over time. ?There are two numbers in a blood pressure reading. There is a top number (systolic) over a bottom number (diastolic). It is best to have a blood pressure that is below 120/80. ?What are the causes? ?The cause of this condition is not known. Some other conditions can lead to high blood pressure. ?What increases the risk? ?Some lifestyle factors can make you more likely to develop high blood pressure: ?Smoking. ?Not getting enough exercise or physical activity. ?Being overweight. ?Having too much fat, sugar, calories, or salt (sodium) in your diet. ?Drinking too much alcohol. ?Other risk factors include: ?Having any of these conditions: ?Heart disease. ?Diabetes. ?High cholesterol. ?Kidney disease. ?Obstructive sleep apnea. ?Having a family history of high blood pressure and high cholesterol. ?Age. The risk increases with age. ?Stress. ?What are the signs or symptoms? ?High blood pressure may not cause symptoms. Very high blood pressure (hypertensive crisis) may cause: ?Headache. ?Fast or uneven heartbeats (palpitations). ?Shortness of breath. ?Nosebleed. ?Vomiting or feeling like you may vomit (nauseous). ?Changes in how you see. ?Very bad chest pain. ?Feeling dizzy. ?Seizures. ?How is this treated? ?This condition is treated by making healthy lifestyle changes, such as: ?Eating healthy foods. ?Exercising more. ?Drinking less alcohol. ?Your doctor may prescribe medicine if lifestyle changes do not help enough and if: ?Your top number is above 130. ?Your bottom number is above 80. ?Your personal target blood pressure may vary. ?Follow these instructions at home: ?Eating and drinking ? ?If told, follow the DASH eating plan. To follow this plan: ?Fill one half of your plate at each meal with fruits and vegetables. ?Fill one fourth of your plate  at each meal with whole grains. Whole grains include whole-wheat pasta, brown rice, and whole-grain bread. ?Eat or drink low-fat dairy products, such as skim milk or low-fat yogurt. ?Fill one fourth of your plate at each meal with low-fat (lean) proteins. Low-fat proteins include fish, chicken without skin, eggs, beans, and tofu. ?Avoid fatty meat, cured and processed meat, or chicken with skin. ?Avoid pre-made or processed food. ?Limit the amount of salt in your diet to less than 1,500 mg each day. ?Do not drink alcohol if: ?Your doctor tells you not to drink. ?You are pregnant, may be pregnant, or are planning to become pregnant. ?If you drink alcohol: ?Limit how much you have to: ?0-1 drink a day for women. ?0-2 drinks a day for men. ?Know how much alcohol is in your drink. In the [place] one drink equals one 12 oz bottle of beer (355 mL), one 5 oz glass of wine (148 mL), or one 1? oz glass of hard liquor (44 mL). ?Lifestyle ? ?Work with your doctor to stay at a healthy weight or to lose weight. Ask your doctor what the best weight is for you. ?Get at least 30 minutes of exercise that causes your heart to beat faster (aerobic exercise) most days of the week. This may include walking, swimming, or biking. ?Get at least 30 minutes of exercise that strengthens your muscles (resistance exercise) at least 3 days a week. This may include lifting weights or doing Pilates. ?Do not smoke or use any products that contain nicotine or tobacco. If you need help quitting, ask your doctor. ?Check your blood pressure at home as told by your doctor. ?Keep all follow-up visits. ?Medicines ?Take over-the-counter and prescription medicines   only as told by your doctor. Follow directions carefully. ?Do not skip doses of blood pressure medicine. The medicine does not work as well if you skip doses. Skipping doses also puts you at risk for problems. ?Ask your doctor about side effects or reactions to medicines that you should watch  for. ?Contact a doctor if: ?You think you are having a reaction to the medicine you are taking. ?You have headaches that keep coming back. ?You feel dizzy. ?You have swelling in your ankles. ?You have trouble with your vision. ?Get help right away if: ?You get a very bad headache. ?You start to feel mixed up (confused). ?You feel weak or numb. ?You feel faint. ?You have very bad pain in your: ?Chest. ?Belly (abdomen). ?You vomit more than once. ?You have trouble breathing. ?These symptoms may be an emergency. Get help right away. Call 911. ?Do not wait to see if the symptoms will go away. ?Do not drive yourself to the hospital. ?Summary ?Hypertension is another name for high blood pressure. ?High blood pressure forces your heart to work harder to pump blood. ?For most people, a normal blood pressure is less than 120/80. ?Making healthy choices can help lower blood pressure. If your blood pressure does not get lower with healthy choices, you may need to take medicine. ?This information is not intended to replace advice given to you by your health care provider. Make sure you discuss any questions you have with your health care provider. ?Document Revised: 07/26/2021 Document Reviewed: 07/26/2021 ?Elsevier Patient Education ? 2023 Elsevier Inc. ? ?

## 2023-04-03 ENCOUNTER — Other Ambulatory Visit (HOSPITAL_COMMUNITY): Payer: Self-pay

## 2023-04-09 DIAGNOSIS — A048 Other specified bacterial intestinal infections: Secondary | ICD-10-CM | POA: Diagnosis not present

## 2023-04-09 DIAGNOSIS — R1013 Epigastric pain: Secondary | ICD-10-CM | POA: Diagnosis not present

## 2023-04-24 IMAGING — US US BREAST BX W LOC DEV 1ST LESION IMG BX SPEC US GUIDE*L*
1 series · 12 of 17 positions shown · non-contrast
Comparison: Previous examinations.
COMPARISON: Previous examinations.

Addendum:
CLINICAL DATA: 7 mm intraductal mass in the 6 o'clock position of
the left breast, 2 cm from the nipple, at recent ultrasound.

EXAM:
ULTRASOUND GUIDED LEFT BREAST CORE NEEDLE BIOPSY

[Series 1: us breast bx w loc dev 1st lesion img bx spec us g · 0.06mm/px · 12 of 17 slices shown]
[im 1/17]
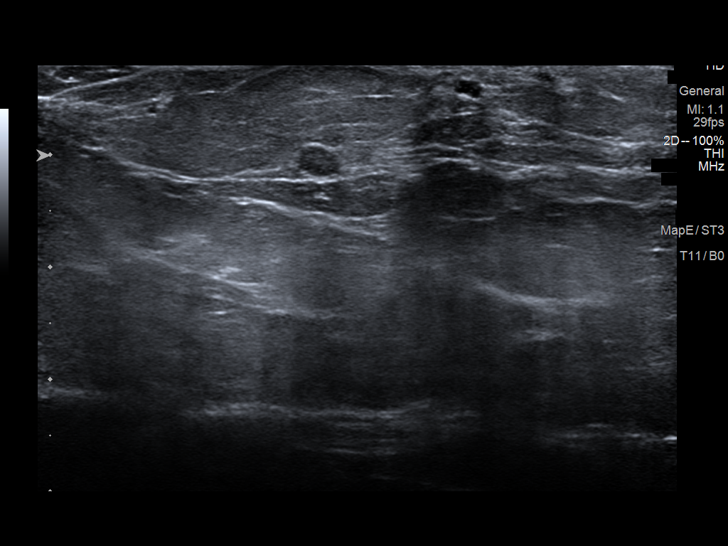
[im 3/17]
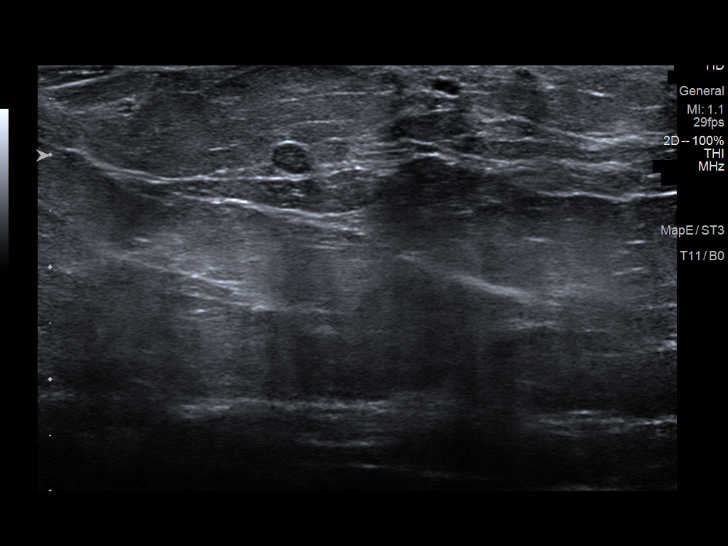
[im 4/17]
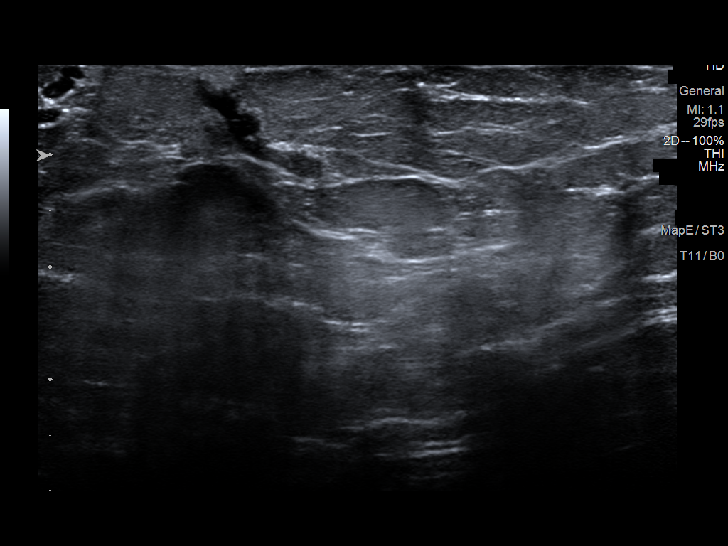
[im 5/17]
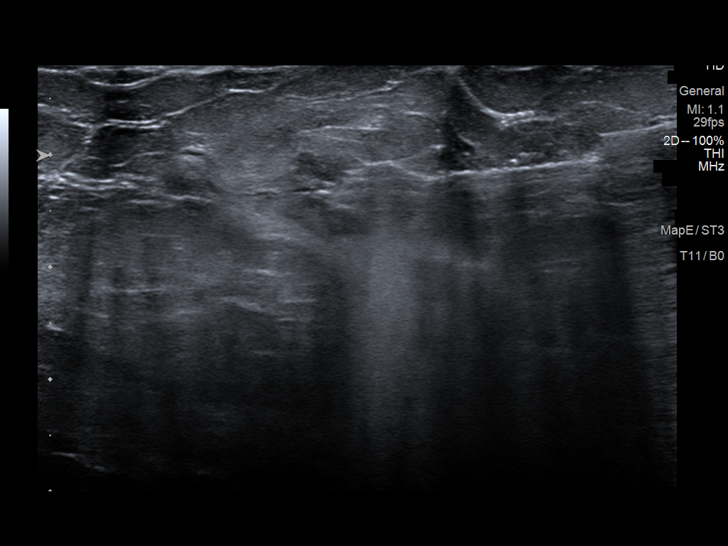
[im 7/17]
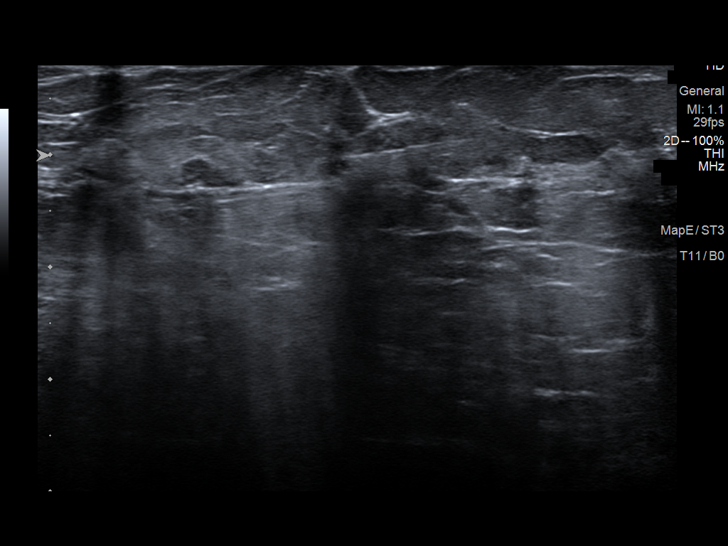
[im 8/17]
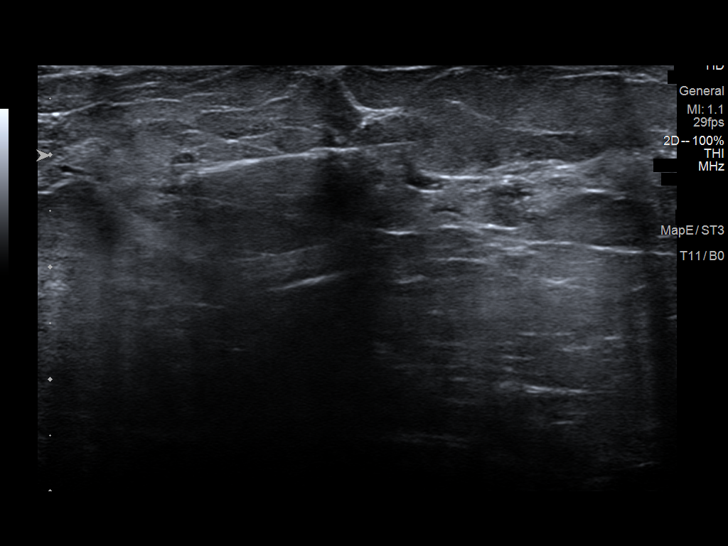
[im 10/17]
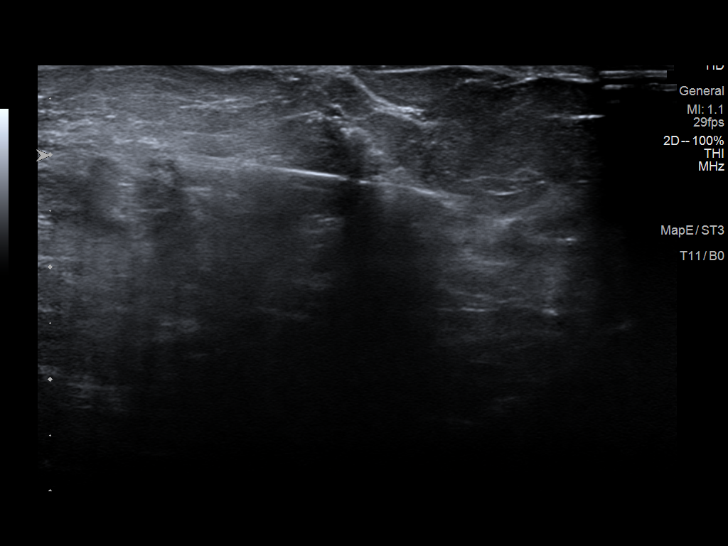
[im 11/17]
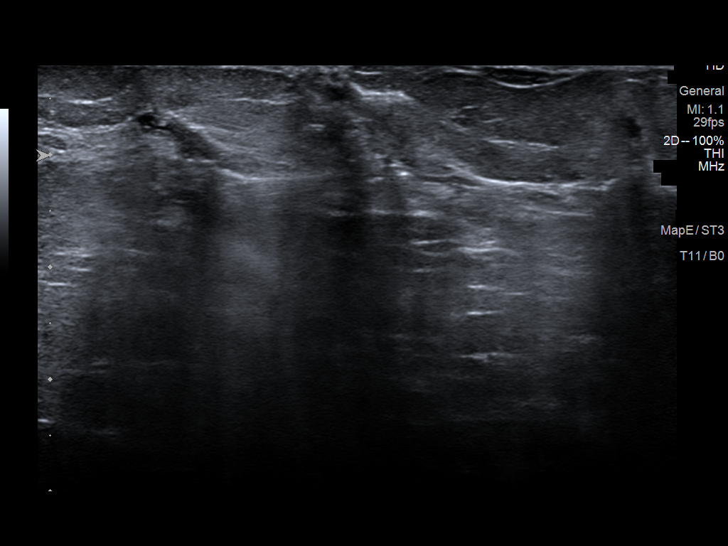
[im 13/17]
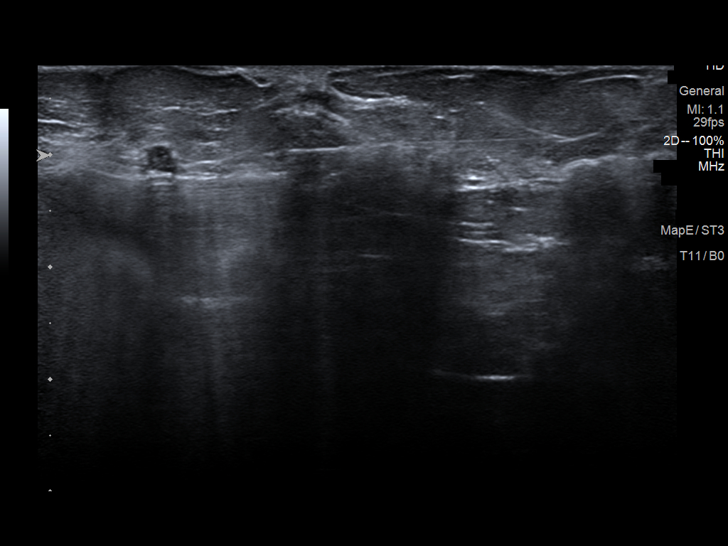
[im 14/17]
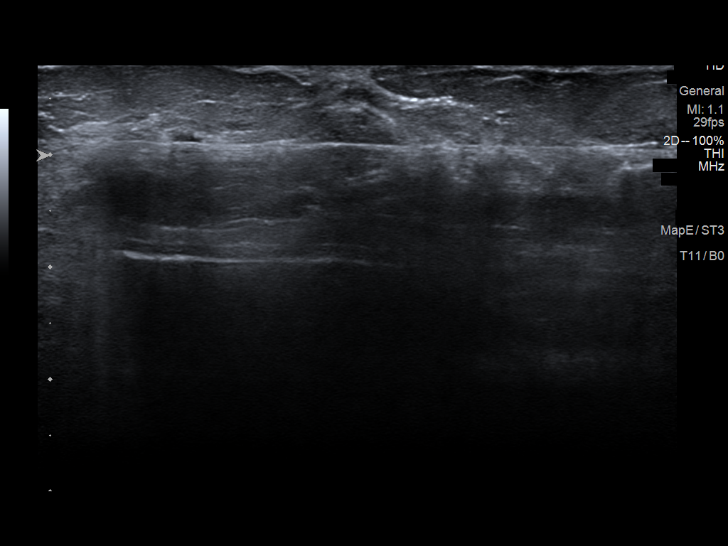
[im 15/17]
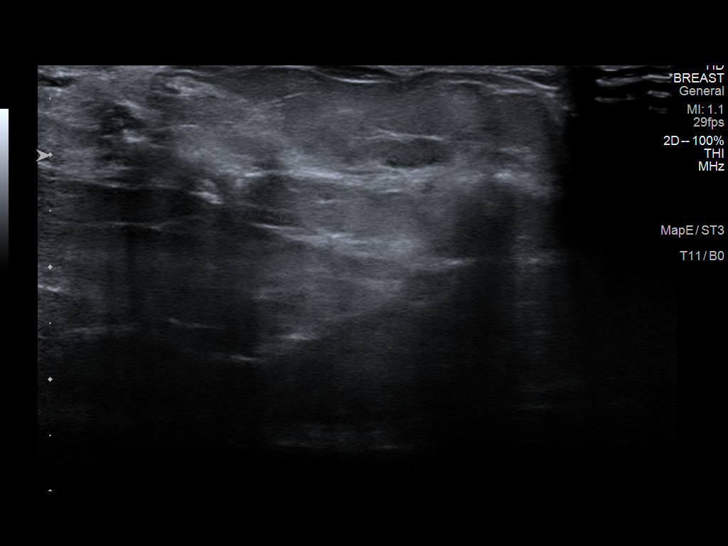
[im 17/17]
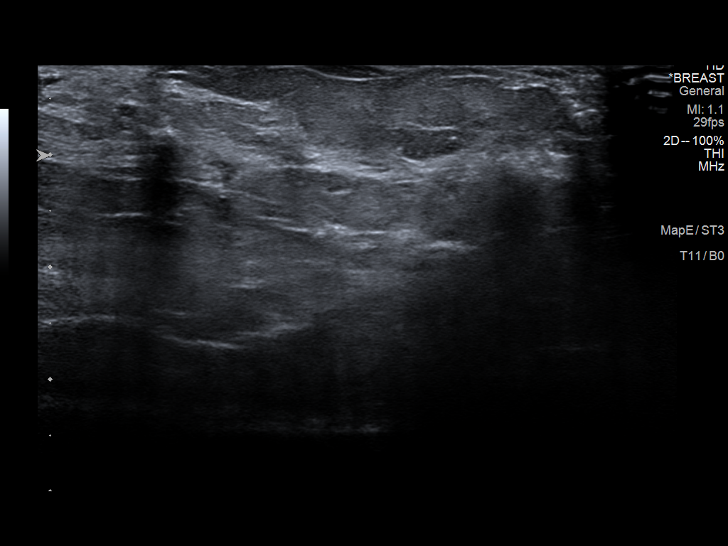

[12 of 17 positions shown; findings below may reference images not displayed]



Using sterile technique and 1% Lidocaine as local anesthetic, under
direct ultrasound visualization, a 12 gauge Akumilikbudi device was
used to perform biopsy of the recently demonstrated 7 mm intraductal
mass in the 6 o'clock position of the left breast, 2 cm from the
nipple, using an inferolateral approach. At the conclusion of the
procedure a ribbon shaped tissue marker clip was deployed into the
biopsy cavity. Follow up 2 view mammogram was performed and dictated
separately.
IMPRESSION: Ultrasound guided biopsy of the recently demonstrated 7 mm
intraductal mass in the 6 position of the left breast. No apparent
complications.

ADDENDUM:
Pathology revealed Breast, LEFT, needle core biopsy, 6 o'clock,
0cmfn, ribbon clip- SCLEROSED INTRADUCTAL PAPILLOMA WITH FOCAL USUAL
DUCTAL HYPERPLASIA AND APOCRINE METAPLASIA. This was found to be
concordant by Dr. Nhan Kimmel, with surgical consultation for
excision recommended per protocol.

Pathology results were discussed with the patient by telephone. The
patient reported doing well after the biopsy with tenderness at the
site. Post biopsy instructions and care were reviewed and questions
were answered. The patient was encouraged to call The [REDACTED]

Surgical consultation has been arranged with Dr. Tramy Bejo at
[REDACTED] on April 15, 2022.

Pathology results reported by Rtoyota Joshjax RN on 04/01/2022.



Using sterile technique and 1% Lidocaine as local anesthetic, under
direct ultrasound visualization, a 12 gauge Akumilikbudi device was
used to perform biopsy of the recently demonstrated 7 mm intraductal
mass in the 6 o'clock position of the left breast, 2 cm from the
nipple, using an inferolateral approach. At the conclusion of the
procedure a ribbon shaped tissue marker clip was deployed into the
biopsy cavity. Follow up 2 view mammogram was performed and dictated
separately.
IMPRESSION: Ultrasound guided biopsy of the recently demonstrated 7 mm
intraductal mass in the 6 position of the left breast. No apparent
complications.

## 2023-04-24 IMAGING — MG MM BREAST LOCALIZATION CLIP
4 series · 4 of 12 positions shown · non-contrast
Comparison: Previous exam(s).

CLINICAL DATA: Status post ultrasound-guided core needle biopsy of
the 7 mm intraductal mass in the 6 o'clock position of the left
breast.

EXAM:
3D DIAGNOSTIC LEFT MAMMOGRAM POST ULTRASOUND BIOPSY

[L ML synth-2D]
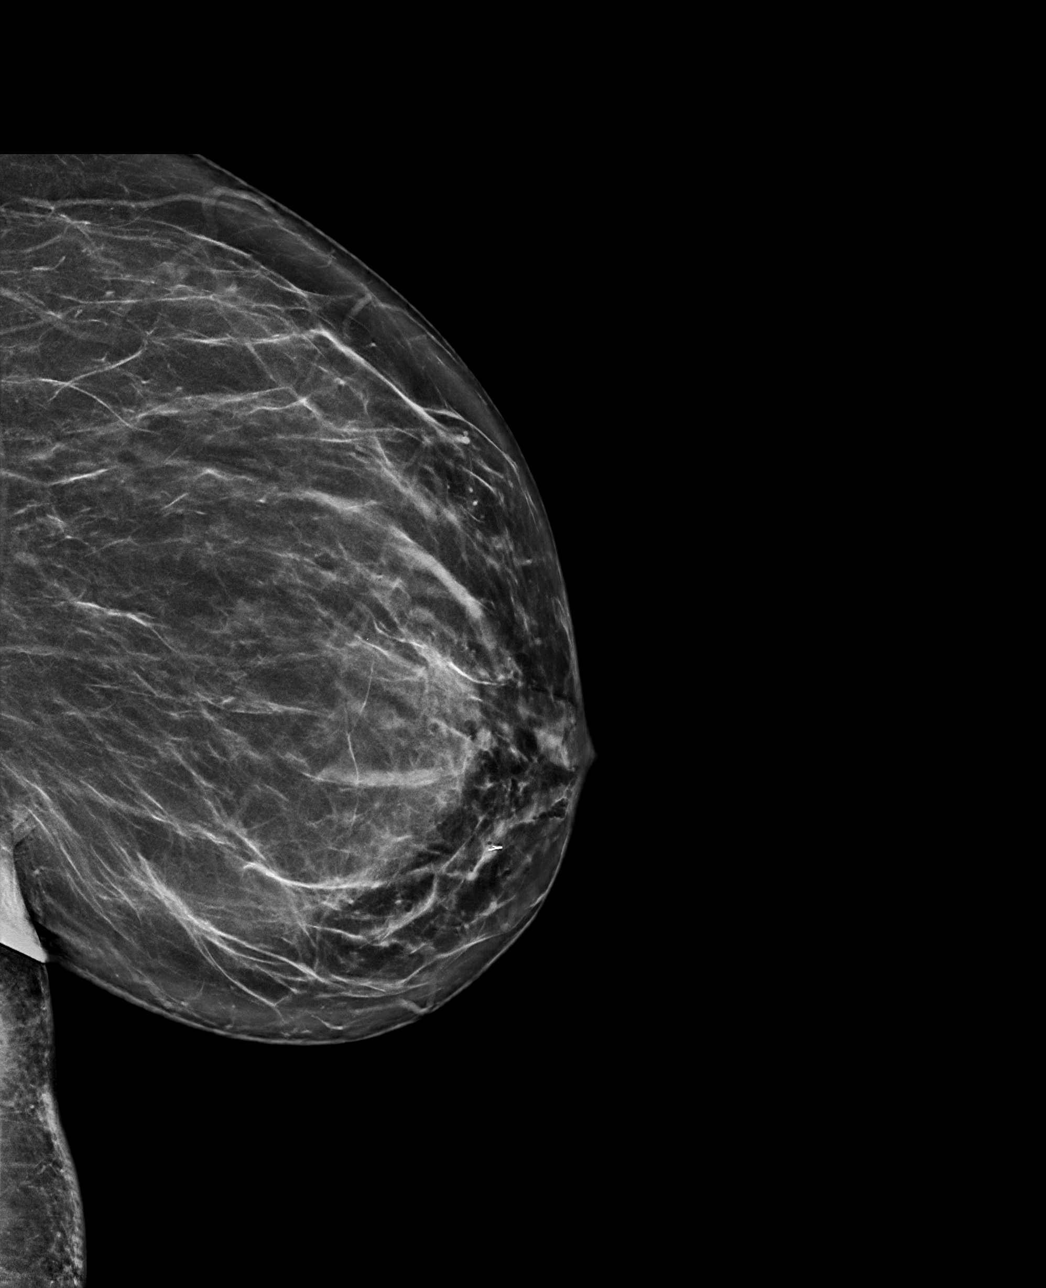

[L CC synth-2D]
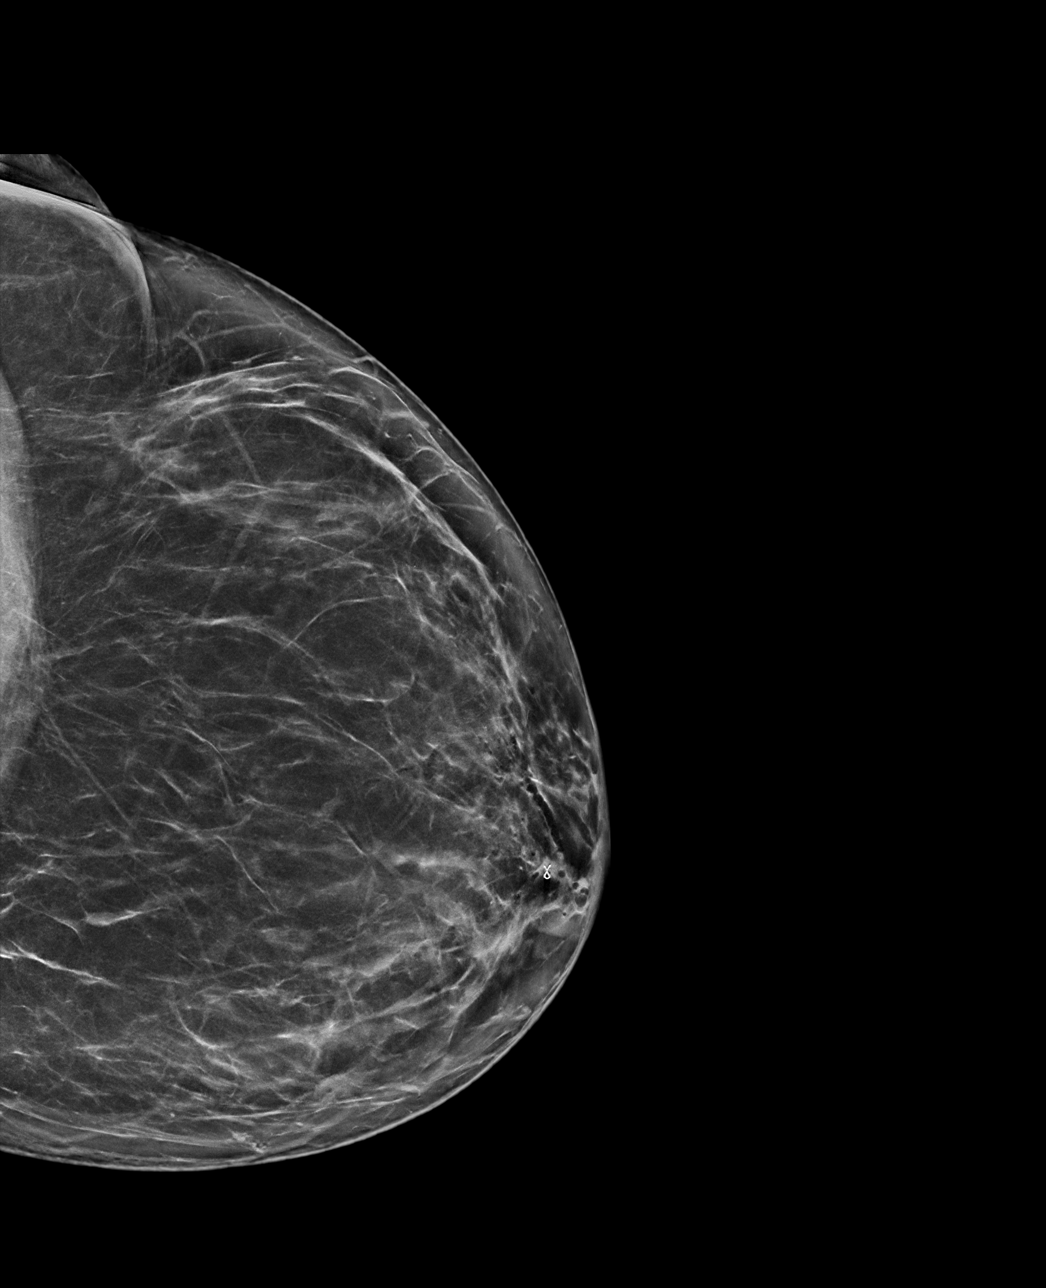

[L ML tomo · tomo slice 41/81.0]
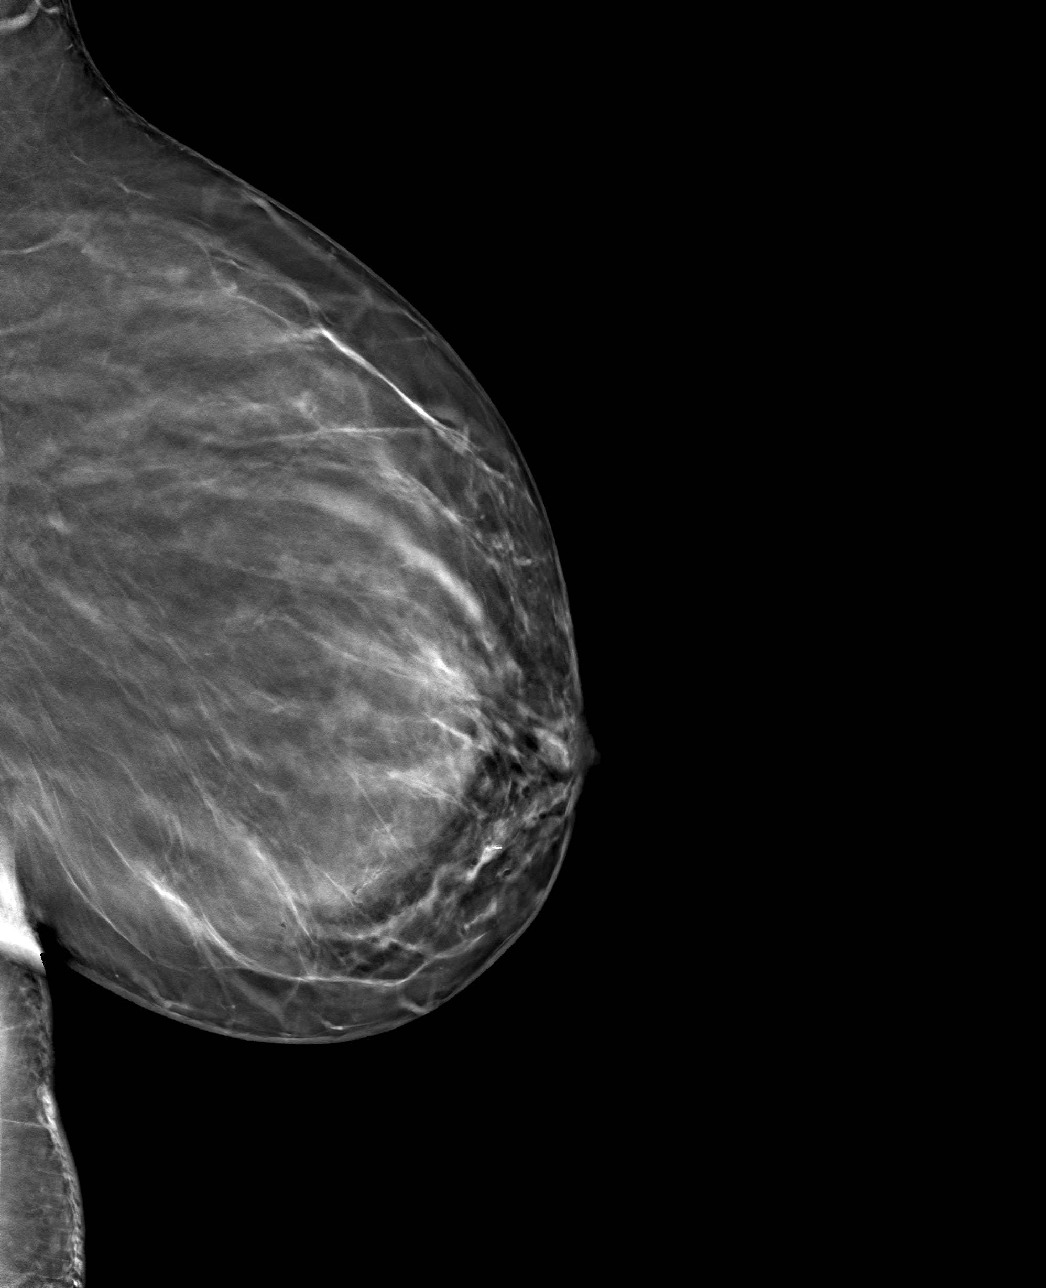

[L CC tomo · tomo slice 39/78.0]
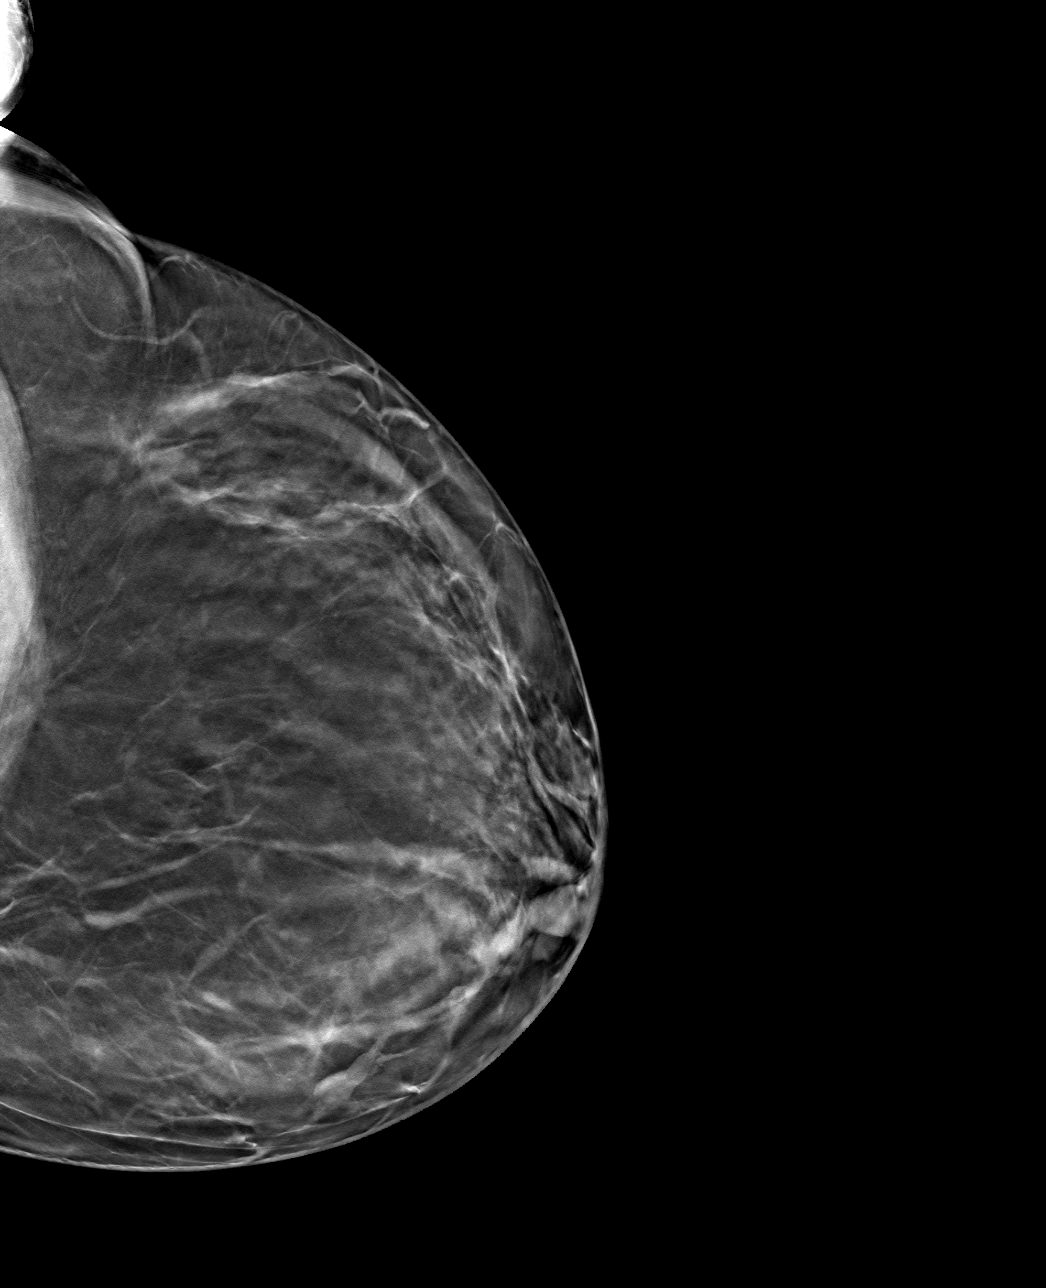

[4 of 12 positions shown; findings below may reference images not displayed]

FINDINGS: 3D Mammographic images were obtained following ultrasound guided
biopsy of recently demonstrated 7 mm mass in the 6 o'clock position
of the left breast. The biopsy marking clip is in expected position
at the site of biopsy.
IMPRESSION: Appropriate positioning of the ribbon shaped biopsy marking clip at
the site of biopsy in the 6 o'clock position of the left breast.

Final Assessment: Post Procedure Mammograms for Marker Placement

## 2023-05-08 ENCOUNTER — Other Ambulatory Visit (HOSPITAL_BASED_OUTPATIENT_CLINIC_OR_DEPARTMENT_OTHER): Payer: Self-pay

## 2023-05-08 DIAGNOSIS — R140 Abdominal distension (gaseous): Secondary | ICD-10-CM | POA: Diagnosis not present

## 2023-05-08 DIAGNOSIS — K2950 Unspecified chronic gastritis without bleeding: Secondary | ICD-10-CM | POA: Diagnosis not present

## 2023-05-08 DIAGNOSIS — R1013 Epigastric pain: Secondary | ICD-10-CM | POA: Diagnosis not present

## 2023-05-09 ENCOUNTER — Other Ambulatory Visit (HOSPITAL_COMMUNITY): Payer: Self-pay

## 2023-05-09 MED ORDER — LINZESS 290 MCG PO CAPS
290.0000 ug | ORAL_CAPSULE | ORAL | 3 refills | Status: DC
  Filled 2023-05-09: qty 90, 90d supply, fill #0

## 2023-05-19 ENCOUNTER — Other Ambulatory Visit: Payer: Self-pay

## 2023-05-19 ENCOUNTER — Other Ambulatory Visit (HOSPITAL_COMMUNITY): Payer: Self-pay

## 2023-05-19 MED ORDER — AMOXICILLIN 500 MG PO CAPS
1000.0000 mg | ORAL_CAPSULE | Freq: Two times a day (BID) | ORAL | 0 refills | Status: DC
  Filled 2023-05-19: qty 56, 14d supply, fill #0

## 2023-05-19 MED ORDER — CLARITHROMYCIN 500 MG PO TABS
500.0000 mg | ORAL_TABLET | Freq: Two times a day (BID) | ORAL | 0 refills | Status: DC
  Filled 2023-05-19: qty 28, 14d supply, fill #0

## 2023-05-19 MED ORDER — LANSOPRAZOLE 30 MG PO CPDR
30.0000 mg | DELAYED_RELEASE_CAPSULE | Freq: Two times a day (BID) | ORAL | 0 refills | Status: DC
  Filled 2023-05-19: qty 28, 14d supply, fill #0

## 2023-05-26 ENCOUNTER — Encounter: Payer: BLUE CROSS/BLUE SHIELD | Admitting: Nurse Practitioner

## 2023-06-20 ENCOUNTER — Encounter (HOSPITAL_BASED_OUTPATIENT_CLINIC_OR_DEPARTMENT_OTHER): Payer: Self-pay | Admitting: Emergency Medicine

## 2023-06-20 ENCOUNTER — Other Ambulatory Visit: Payer: Self-pay

## 2023-06-20 ENCOUNTER — Inpatient Hospital Stay (HOSPITAL_BASED_OUTPATIENT_CLINIC_OR_DEPARTMENT_OTHER)
Admission: EM | Admit: 2023-06-20 | Discharge: 2023-07-16 | DRG: 329 | Disposition: A | Discharge status: Home / Self Care | Payer: BC Managed Care – PPO | Attending: Internal Medicine | Admitting: Internal Medicine

## 2023-06-20 ENCOUNTER — Telehealth: Payer: Self-pay

## 2023-06-20 DIAGNOSIS — Z9851 Tubal ligation status: Secondary | ICD-10-CM

## 2023-06-20 DIAGNOSIS — R9389 Abnormal findings on diagnostic imaging of other specified body structures: Secondary | ICD-10-CM

## 2023-06-20 DIAGNOSIS — C800 Disseminated malignant neoplasm, unspecified: Secondary | ICD-10-CM | POA: Diagnosis not present

## 2023-06-20 DIAGNOSIS — N939 Abnormal uterine and vaginal bleeding, unspecified: Secondary | ICD-10-CM | POA: Diagnosis not present

## 2023-06-20 DIAGNOSIS — R188 Other ascites: Secondary | ICD-10-CM | POA: Diagnosis not present

## 2023-06-20 DIAGNOSIS — C189 Malignant neoplasm of colon, unspecified: Secondary | ICD-10-CM | POA: Diagnosis not present

## 2023-06-20 DIAGNOSIS — L02211 Cutaneous abscess of abdominal wall: Secondary | ICD-10-CM | POA: Diagnosis not present

## 2023-06-20 DIAGNOSIS — C172 Malignant neoplasm of ileum: Secondary | ICD-10-CM | POA: Diagnosis not present

## 2023-06-20 DIAGNOSIS — D72820 Lymphocytosis (symptomatic): Secondary | ICD-10-CM | POA: Diagnosis present

## 2023-06-20 DIAGNOSIS — K921 Melena: Secondary | ICD-10-CM

## 2023-06-20 DIAGNOSIS — K56609 Unspecified intestinal obstruction, unspecified as to partial versus complete obstruction: Secondary | ICD-10-CM

## 2023-06-20 DIAGNOSIS — K5669 Other partial intestinal obstruction: Secondary | ICD-10-CM | POA: Diagnosis not present

## 2023-06-20 DIAGNOSIS — C787 Secondary malignant neoplasm of liver and intrahepatic bile duct: Secondary | ICD-10-CM | POA: Diagnosis not present

## 2023-06-20 DIAGNOSIS — I10 Essential (primary) hypertension: Secondary | ICD-10-CM | POA: Diagnosis not present

## 2023-06-20 DIAGNOSIS — N938 Other specified abnormal uterine and vaginal bleeding: Secondary | ICD-10-CM | POA: Diagnosis present

## 2023-06-20 DIAGNOSIS — N261 Atrophy of kidney (terminal): Secondary | ICD-10-CM | POA: Diagnosis not present

## 2023-06-20 DIAGNOSIS — K56699 Other intestinal obstruction unspecified as to partial versus complete obstruction: Secondary | ICD-10-CM | POA: Diagnosis not present

## 2023-06-20 DIAGNOSIS — Z79899 Other long term (current) drug therapy: Secondary | ICD-10-CM

## 2023-06-20 DIAGNOSIS — E43 Unspecified severe protein-calorie malnutrition: Secondary | ICD-10-CM | POA: Diagnosis not present

## 2023-06-20 DIAGNOSIS — J984 Other disorders of lung: Secondary | ICD-10-CM | POA: Diagnosis not present

## 2023-06-20 DIAGNOSIS — R935 Abnormal findings on diagnostic imaging of other abdominal regions, including retroperitoneum: Secondary | ICD-10-CM | POA: Diagnosis not present

## 2023-06-20 DIAGNOSIS — D62 Acute posthemorrhagic anemia: Secondary | ICD-10-CM | POA: Diagnosis present

## 2023-06-20 DIAGNOSIS — K5900 Constipation, unspecified: Secondary | ICD-10-CM

## 2023-06-20 DIAGNOSIS — K922 Gastrointestinal hemorrhage, unspecified: Secondary | ICD-10-CM

## 2023-06-20 DIAGNOSIS — R1900 Intra-abdominal and pelvic swelling, mass and lump, unspecified site: Secondary | ICD-10-CM | POA: Diagnosis not present

## 2023-06-20 DIAGNOSIS — Z8249 Family history of ischemic heart disease and other diseases of the circulatory system: Secondary | ICD-10-CM

## 2023-06-20 DIAGNOSIS — D509 Iron deficiency anemia, unspecified: Secondary | ICD-10-CM | POA: Diagnosis not present

## 2023-06-20 DIAGNOSIS — R1013 Epigastric pain: Secondary | ICD-10-CM | POA: Diagnosis not present

## 2023-06-20 DIAGNOSIS — Z539 Procedure and treatment not carried out, unspecified reason: Secondary | ICD-10-CM | POA: Diagnosis not present

## 2023-06-20 DIAGNOSIS — C179 Malignant neoplasm of small intestine, unspecified: Secondary | ICD-10-CM | POA: Diagnosis not present

## 2023-06-20 DIAGNOSIS — C801 Malignant (primary) neoplasm, unspecified: Secondary | ICD-10-CM | POA: Diagnosis not present

## 2023-06-20 DIAGNOSIS — G8929 Other chronic pain: Secondary | ICD-10-CM | POA: Diagnosis not present

## 2023-06-20 DIAGNOSIS — J90 Pleural effusion, not elsewhere classified: Secondary | ICD-10-CM | POA: Diagnosis not present

## 2023-06-20 DIAGNOSIS — E1169 Type 2 diabetes mellitus with other specified complication: Secondary | ICD-10-CM | POA: Diagnosis not present

## 2023-06-20 DIAGNOSIS — T8143XA Infection following a procedure, organ and space surgical site, initial encounter: Secondary | ICD-10-CM | POA: Diagnosis not present

## 2023-06-20 DIAGNOSIS — K828 Other specified diseases of gallbladder: Secondary | ICD-10-CM | POA: Diagnosis not present

## 2023-06-20 DIAGNOSIS — K2951 Unspecified chronic gastritis with bleeding: Secondary | ICD-10-CM | POA: Diagnosis present

## 2023-06-20 DIAGNOSIS — Z6841 Body Mass Index (BMI) 40.0 and over, adult: Secondary | ICD-10-CM | POA: Diagnosis not present

## 2023-06-20 DIAGNOSIS — J9811 Atelectasis: Secondary | ICD-10-CM | POA: Diagnosis not present

## 2023-06-20 DIAGNOSIS — Z833 Family history of diabetes mellitus: Secondary | ICD-10-CM

## 2023-06-20 DIAGNOSIS — R1907 Generalized intra-abdominal and pelvic swelling, mass and lump: Secondary | ICD-10-CM | POA: Diagnosis not present

## 2023-06-20 DIAGNOSIS — C7962 Secondary malignant neoplasm of left ovary: Secondary | ICD-10-CM | POA: Diagnosis not present

## 2023-06-20 DIAGNOSIS — D500 Iron deficiency anemia secondary to blood loss (chronic): Secondary | ICD-10-CM | POA: Diagnosis not present

## 2023-06-20 DIAGNOSIS — Z4682 Encounter for fitting and adjustment of non-vascular catheter: Secondary | ICD-10-CM | POA: Diagnosis not present

## 2023-06-20 DIAGNOSIS — R195 Other fecal abnormalities: Secondary | ICD-10-CM | POA: Diagnosis not present

## 2023-06-20 DIAGNOSIS — D649 Anemia, unspecified: Principal | ICD-10-CM

## 2023-06-20 DIAGNOSIS — K760 Fatty (change of) liver, not elsewhere classified: Secondary | ICD-10-CM | POA: Diagnosis present

## 2023-06-20 DIAGNOSIS — Z8619 Personal history of other infectious and parasitic diseases: Secondary | ICD-10-CM

## 2023-06-20 DIAGNOSIS — E119 Type 2 diabetes mellitus without complications: Secondary | ICD-10-CM | POA: Diagnosis present

## 2023-06-20 DIAGNOSIS — C763 Malignant neoplasm of pelvis: Secondary | ICD-10-CM | POA: Diagnosis not present

## 2023-06-20 DIAGNOSIS — C786 Secondary malignant neoplasm of retroperitoneum and peritoneum: Secondary | ICD-10-CM | POA: Diagnosis not present

## 2023-06-20 DIAGNOSIS — K769 Liver disease, unspecified: Secondary | ICD-10-CM | POA: Diagnosis not present

## 2023-06-20 DIAGNOSIS — R1903 Right lower quadrant abdominal swelling, mass and lump: Secondary | ICD-10-CM | POA: Diagnosis not present

## 2023-06-20 DIAGNOSIS — B9620 Unspecified Escherichia coli [E. coli] as the cause of diseases classified elsewhere: Secondary | ICD-10-CM | POA: Diagnosis present

## 2023-06-20 DIAGNOSIS — K651 Peritoneal abscess: Secondary | ICD-10-CM | POA: Diagnosis present

## 2023-06-20 DIAGNOSIS — R109 Unspecified abdominal pain: Secondary | ICD-10-CM | POA: Diagnosis not present

## 2023-06-20 DIAGNOSIS — K648 Other hemorrhoids: Secondary | ICD-10-CM | POA: Diagnosis present

## 2023-06-20 DIAGNOSIS — R7303 Prediabetes: Secondary | ICD-10-CM | POA: Diagnosis not present

## 2023-06-20 DIAGNOSIS — K668 Other specified disorders of peritoneum: Secondary | ICD-10-CM | POA: Diagnosis not present

## 2023-06-20 DIAGNOSIS — E669 Obesity, unspecified: Secondary | ICD-10-CM

## 2023-06-20 DIAGNOSIS — K5730 Diverticulosis of large intestine without perforation or abscess without bleeding: Secondary | ICD-10-CM | POA: Diagnosis present

## 2023-06-20 DIAGNOSIS — Z7984 Long term (current) use of oral hypoglycemic drugs: Secondary | ICD-10-CM

## 2023-06-20 DIAGNOSIS — N840 Polyp of corpus uteri: Secondary | ICD-10-CM | POA: Diagnosis not present

## 2023-06-20 LAB — CBC WITH DIFFERENTIAL/PLATELET
Abs Immature Granulocytes: 0.07 10*3/uL (ref 0.00–0.07)
Basophils Absolute: 0 10*3/uL (ref 0.0–0.1)
Basophils Relative: 0 %
Eosinophils Absolute: 0 10*3/uL (ref 0.0–0.5)
Eosinophils Relative: 0 %
HCT: 22 % — ABNORMAL LOW (ref 36.0–46.0)
Hemoglobin: 5.9 g/dL — CL (ref 12.0–15.0)
Immature Granulocytes: 1 %
Lymphocytes Relative: 7 %
Lymphs Abs: 0.8 10*3/uL (ref 0.7–4.0)
MCH: 18.3 pg — ABNORMAL LOW (ref 26.0–34.0)
MCHC: 26.8 g/dL — ABNORMAL LOW (ref 30.0–36.0)
MCV: 68.1 fL — ABNORMAL LOW (ref 80.0–100.0)
Monocytes Absolute: 0.4 10*3/uL (ref 0.1–1.0)
Monocytes Relative: 3 %
Neutro Abs: 10 10*3/uL — ABNORMAL HIGH (ref 1.7–7.7)
Neutrophils Relative %: 89 %
Platelets: 593 10*3/uL — ABNORMAL HIGH (ref 150–400)
RBC Morphology: NONE SEEN
RBC: 3.23 MIL/uL — ABNORMAL LOW (ref 3.87–5.11)
RDW: 19.5 % — ABNORMAL HIGH (ref 11.5–15.5)
WBC: 11.3 10*3/uL — ABNORMAL HIGH (ref 4.0–10.5)
nRBC: 0.2 % (ref 0.0–0.2)

## 2023-06-20 LAB — LIPASE, BLOOD: Lipase: 35 U/L (ref 11–51)

## 2023-06-20 LAB — COMPREHENSIVE METABOLIC PANEL
ALT: 6 U/L (ref 0–44)
AST: 13 U/L — ABNORMAL LOW (ref 15–41)
Albumin: 4.3 g/dL (ref 3.5–5.0)
Alkaline Phosphatase: 78 U/L (ref 38–126)
Anion gap: 13 (ref 5–15)
BUN: 9 mg/dL (ref 6–20)
CO2: 23 mmol/L (ref 22–32)
Calcium: 9 mg/dL (ref 8.9–10.3)
Chloride: 102 mmol/L (ref 98–111)
Creatinine, Ser: 0.7 mg/dL (ref 0.44–1.00)
GFR, Estimated: >60 mL/min (ref >60)
Glucose, Bld: 136 mg/dL — ABNORMAL HIGH (ref 70–99)
Potassium: 3.5 mmol/L (ref 3.5–5.1)
Sodium: 138 mmol/L (ref 135–145)
Total Bilirubin: 0.6 mg/dL (ref 0.3–1.2)
Total Protein: 8.7 g/dL — ABNORMAL HIGH (ref 6.5–8.1)

## 2023-06-20 LAB — IRON AND TIBC
Iron: 9 ug/dL — ABNORMAL LOW (ref 28–170)
Saturation Ratios: 2 % — ABNORMAL LOW (ref 10.4–31.8)
TIBC: 444 ug/dL (ref 250–450)
UIBC: 435 ug/dL

## 2023-06-20 LAB — PROTIME-INR
INR: 1.1 (ref 0.8–1.2)
Prothrombin Time: 14.4 seconds (ref 11.4–15.2)

## 2023-06-20 LAB — PREGNANCY, URINE: Preg Test, Ur: NEGATIVE

## 2023-06-20 LAB — PREPARE RBC (CROSSMATCH)

## 2023-06-20 LAB — OCCULT BLOOD X 1 CARD TO LAB, STOOL: Fecal Occult Bld: POSITIVE — AB

## 2023-06-20 LAB — FERRITIN: Ferritin: 8 ng/mL — ABNORMAL LOW (ref 11–307)

## 2023-06-20 MED ORDER — DIPHENHYDRAMINE HCL 50 MG/ML IJ SOLN
12.5000 mg | Freq: Once | INTRAMUSCULAR | Status: AC
  Administered 2023-06-20: 12.5 mg via INTRAVENOUS
  Filled 2023-06-20: qty 1

## 2023-06-20 MED ORDER — LINACLOTIDE 145 MCG PO CAPS
290.0000 ug | ORAL_CAPSULE | Freq: Every day | ORAL | Status: DC
  Administered 2023-06-22 – 2023-06-23 (×2): 290 ug via ORAL
  Filled 2023-06-20 (×2): qty 2

## 2023-06-20 MED ORDER — ONDANSETRON HCL 4 MG/2ML IJ SOLN
4.0000 mg | Freq: Four times a day (QID) | INTRAMUSCULAR | Status: DC | PRN
  Administered 2023-06-22 – 2023-07-09 (×7): 4 mg via INTRAVENOUS
  Filled 2023-06-20 (×8): qty 2

## 2023-06-20 MED ORDER — SODIUM CHLORIDE 0.9% FLUSH
3.0000 mL | Freq: Two times a day (BID) | INTRAVENOUS | Status: DC
  Administered 2023-06-20 – 2023-07-16 (×48): 3 mL via INTRAVENOUS

## 2023-06-20 MED ORDER — ACETAMINOPHEN 325 MG PO TABS
650.0000 mg | ORAL_TABLET | Freq: Four times a day (QID) | ORAL | Status: DC | PRN
  Administered 2023-06-30: 650 mg via ORAL
  Filled 2023-06-20 (×2): qty 2

## 2023-06-20 MED ORDER — DICYCLOMINE HCL 10 MG PO CAPS
20.0000 mg | ORAL_CAPSULE | Freq: Once | ORAL | Status: AC
  Administered 2023-06-20: 20 mg via ORAL
  Filled 2023-06-20: qty 2

## 2023-06-20 MED ORDER — SODIUM CHLORIDE 0.9% IV SOLUTION: Freq: Once | INTRAVENOUS | Status: AC

## 2023-06-20 MED ORDER — ALUM & MAG HYDROXIDE-SIMETH 200-200-20 MG/5ML PO SUSP
30.0000 mL | Freq: Once | ORAL | Status: AC
  Administered 2023-06-20: 30 mL via ORAL
  Filled 2023-06-20: qty 30

## 2023-06-20 MED ORDER — SODIUM CHLORIDE 0.9 % IV BOLUS
1000.0000 mL | Freq: Once | INTRAVENOUS | Status: AC
  Administered 2023-06-20: 1000 mL via INTRAVENOUS

## 2023-06-20 MED ORDER — METOCLOPRAMIDE HCL 5 MG/ML IJ SOLN
5.0000 mg | Freq: Once | INTRAMUSCULAR | Status: AC
  Administered 2023-06-20: 5 mg via INTRAVENOUS
  Filled 2023-06-20: qty 2

## 2023-06-20 MED ORDER — HYDROMORPHONE HCL 1 MG/ML IJ SOLN
0.5000 mg | INTRAMUSCULAR | Status: DC | PRN
  Administered 2023-06-21 – 2023-06-28 (×21): 1 mg via INTRAVENOUS
  Administered 2023-06-28: 0.5 mg via INTRAVENOUS
  Administered 2023-06-29: 1 mg via INTRAVENOUS
  Administered 2023-06-29: 0.5 mg via INTRAVENOUS
  Administered 2023-06-30 – 2023-07-01 (×5): 1 mg via INTRAVENOUS
  Filled 2023-06-20 (×31): qty 1

## 2023-06-20 MED ORDER — PANTOPRAZOLE SODIUM 40 MG IV SOLR
40.0000 mg | Freq: Two times a day (BID) | INTRAVENOUS | Status: DC
  Administered 2023-06-20 – 2023-07-06 (×33): 40 mg via INTRAVENOUS
  Filled 2023-06-20 (×34): qty 10

## 2023-06-20 MED ORDER — LIDOCAINE VISCOUS HCL 2 % MT SOLN
15.0000 mL | Freq: Once | OROMUCOSAL | Status: AC
  Administered 2023-06-20: 15 mL via OROMUCOSAL
  Filled 2023-06-20: qty 15

## 2023-06-20 MED ORDER — HALOPERIDOL LACTATE 5 MG/ML IJ SOLN
2.5000 mg | Freq: Once | INTRAMUSCULAR | Status: AC
  Administered 2023-06-20: 2.5 mg via INTRAVENOUS
  Filled 2023-06-20: qty 1

## 2023-06-20 MED ORDER — FAMOTIDINE IN NACL 20-0.9 MG/50ML-% IV SOLN
20.0000 mg | Freq: Once | INTRAVENOUS | Status: AC
  Administered 2023-06-20: 20 mg via INTRAVENOUS
  Filled 2023-06-20: qty 50

## 2023-06-20 MED ORDER — PANTOPRAZOLE SODIUM 40 MG IV SOLR
40.0000 mg | Freq: Once | INTRAVENOUS | Status: AC
  Administered 2023-06-20: 40 mg via INTRAVENOUS
  Filled 2023-06-20: qty 10

## 2023-06-20 MED ORDER — OXYCODONE HCL 5 MG PO TABS: 5.0000 mg | ORAL_TABLET | Freq: Four times a day (QID) | ORAL | Status: DC | PRN

## 2023-06-20 MED ORDER — ONDANSETRON HCL 4 MG PO TABS: 4.0000 mg | ORAL_TABLET | Freq: Four times a day (QID) | ORAL | Status: DC | PRN

## 2023-06-20 MED ORDER — IRBESARTAN 150 MG PO TABS
150.0000 mg | ORAL_TABLET | Freq: Every day | ORAL | Status: DC
  Administered 2023-06-21 – 2023-06-23 (×3): 150 mg via ORAL
  Filled 2023-06-20 (×3): qty 1

## 2023-06-20 MED ORDER — ACETAMINOPHEN 650 MG RE SUPP: 650.0000 mg | Freq: Four times a day (QID) | RECTAL | Status: DC | PRN

## 2023-06-20 NOTE — Assessment & Plan Note (Addendum)
-   Possibly slow/chronic blood loss from underlying "chronic inactive gastritis" noted on EGD from 05/08/2023.  No H. pylori organisms were noted on immunohistochemical stain but due to pattern of inflammation, she was started on H. pylori treatment which she completed approximately 2 weeks prior to admission -Stools were melanotic in appearance but she states she has also been on oral iron daily -Hemoglobin certainly low on admission, 5.9 g/dL.   - now also s/p ex-lap with presumed some blood loss as expected - will continue to trend H/H and transfuse as necessary - no further scopes per GI at this time

## 2023-06-20 NOTE — Telephone Encounter (Signed)
Patient called answering service stating she has been throwing up all night, constipated, and no energy. Patient was called and  encouraged to go to ER.

## 2023-06-20 NOTE — Assessment & Plan Note (Addendum)
-   on admission: iron labs: Iron 9, sat ratio 2%, TIBC 444. Ferritin 8 - s/p 4 units PRBC so far since admission and received 500 mg Ferrlecit on admission - repeat iron studies on 9/13 still low (mild improvement). Will give further repletion with Ferrlecit x 4 days

## 2023-06-20 NOTE — ED Provider Notes (Signed)
[place] EMERGENCY DEPARTMENT AT [facility] Provider Note   CSN: [id number] Arrival date & time: 06/20/23  1114     History  Chief Complaint  Patient presents with   Abdominal Pain    Linda [name] is a 57 y.o. female who presents emergency department chief complaint of epigastric abdominal pain and vomiting.  Patient has a history of erosive gastritis and H. pylori.  She follows with Dr. Audley Hose and had an EGD which I reviewed performed 1 year ago.  Patient reports she has had several days of epigastric abdominal pain which she describes as sharp, waxing and waning but constant with associated nausea and vomiting.  Last episode of vomiting nonbloody nonbilious vomitus was at 8:30 AM.  She denies any fevers, diarrhea or constipation.  He does not take anything regularly to reduce acid and has not had any nausea medications for use at home.   Abdominal Pain      Home Medications Prior to Admission medications   Medication Sig Start Date End Date Taking? Authorizing Provider  amoxicillin (AMOXIL) 500 MG capsule Take 2 capsules (1,000 mg total) by mouth 2 (two) times daily for 14 days 05/19/23     clarithromycin (BIAXIN) 500 MG tablet Take 1 tablet (500 mg total) by mouth every 12 (twelve) hours for 14 days 05/19/23     Ferric Maltol (ACCRUFER) 30 MG CAPS Take 1 capsule (30 mg total) by mouth daily. 03/05/23   Arnette Felts, [name]  ferrous sulfate 325 (65 FE) MG tablet Take 1 tablet (325 mg total) by mouth daily. Patient not taking: Reported on 03/05/2023 02/22/22   Dione Booze, MD  fluticasone [facility]) 50 MCG/ACT nasal spray Place 2 sprays into both nostrils daily. Patient not taking: Reported on 03/05/2023 07/17/22   Henderly, Britni A, PA-C  lansoprazole (PREVACID) 30 MG capsule Take 1 capsule (30 mg total) by mouth 2 (two) times daily for 14 days 05/19/23     linaclotide (LINZESS) 290 MCG CAPS capsule Take 1 capsule (290 mcg total) by mouth 20 minutes before breakfast 05/09/23      metFORMIN (GLUCOPHAGE) 500 MG tablet Take 1 tablet (500 mg total) by mouth 2 (two) times daily with a meal. Patient not taking: Reported on 03/05/2023 12/16/22   Arnette Felts, [name]  Semaglutide (RYBELSUS) 7 MG TABS Take 1 tablet (7 mg total) by mouth daily. 01/13/23   Arnette Felts, [name]  valsartan (DIOVAN) 160 MG tablet Take 1 tablet (160 mg total) by mouth daily. 10/03/22 10/03/23  Arnette Felts, [name]  Vitamin D, Ergocalciferol, (DRISDOL) 1.25 MG (50000 UNIT) CAPS capsule Take 1 capsule (50,000 Units total) by mouth 2 (two) times a week. 01/23/23   Arnette Felts, [name]      Allergies    Patient has no known allergies.    Review of Systems   Review of Systems  Gastrointestinal:  Positive for abdominal pain.    Physical Exam Updated Vital Signs BP (!) 152/70   Pulse 82   Temp 98.4 F (36.9 C) (Oral)   Resp (!) 25   SpO2 98%  Physical Exam Vitals and nursing note reviewed.  Constitutional:      General: She is not in acute distress.    Appearance: She is well-developed. She is not diaphoretic.  HENT:     Head: Normocephalic and atraumatic.     Right Ear: External ear normal.     Left Ear: External ear normal.     Nose: Nose normal.     Mouth/Throat:  Mouth: Mucous membranes are moist.  Eyes:     General: No scleral icterus.    Conjunctiva/sclera: Conjunctivae normal.  Cardiovascular:     Rate and Rhythm: Normal rate and regular rhythm.     Heart sounds: Normal heart sounds. No murmur heard.    No friction rub. No gallop.  Pulmonary:     Effort: Pulmonary effort is normal. No respiratory distress.     Breath sounds: Normal breath sounds.  Abdominal:     General: Bowel sounds are normal. There is no distension.     Palpations: Abdomen is soft. There is no mass.     Tenderness: There is abdominal tenderness in the epigastric area. There is no guarding.  Musculoskeletal:     Cervical back: Normal range of motion.  Skin:    General: Skin is warm and dry.  Neurological:      Mental Status: She is alert and oriented to person, place, and time.  Psychiatric:        Behavior: Behavior normal.     ED Results / Procedures / Treatments   Labs (all labs ordered are listed, but only abnormal results are displayed) Labs Reviewed - No data to display  EKG EKG Interpretation Date/Time:  Friday June 20 2023 11:25:02 EDT Ventricular Rate:  85 PR Interval:  143 QRS Duration:  79 QT Interval:  393 QTC Calculation: 468 R Axis:   42  Text Interpretation: Sinus rhythm Probable left atrial enlargement No significant change since last tracing Confirmed by Melene Plan [phone]) on 06/20/2023 11:31:22 AM  Radiology No results found.  Procedures Procedures    Medications Ordered in ED Medications  sodium chloride 0.9 % bolus 1,000 mL (has no administration in time range)  famotidine (PEPCID) IVPB 20 mg premix (has no administration in time range)  metoCLOPramide (REGLAN) injection 5 mg (has no administration in time range)    ED Course/ Medical Decision Making/ A&P Clinical Course as of 06/20/23 1823  Fri Jun 20, 2023  1439 Patient had a large amount of vomiting and feels that she cannot tolerate GI cocktail at this point.  I have ordered Haldol and Benadryl. [AH]  1650 Patient [AH]  1651 Hemoglobin(!!): 5.9 [AH]  1651 Fecal Occult Blood, POC(!): POSITIVE [AH]  1651 Comprehensive metabolic panel(!) [AH]    Clinical Course User Index [AH] Arthor Captain, PA-C                                 Medical Decision Making 57 year old female who presents the emergency department with chief complaint of epigastric abdominal pain and vomiting Differential diagnosis of epigastric pain includes: Functional or nonulcer dyspepsia  PUD, GERD, Gastritis, (NSAIDs, alcohol, stress, H. pylori, pernicious anemia), pancreatitis or pancreatic cancer, overeating indigestion (high-fat foods, coffee), drugs (aspirin, antibiotics (eg, macrolides, metronidazole), corticosteroids,  digoxin, narcotics, theophylline), gastroparesis, lactose intolerance, malabsorption gastric cancer, parasitic infection, (Giardia, Strongyloides, Ascaris) cholelithiasis, choledocholithiasis, or cholangitis, ACS, pericarditis, pneumonia, abdominal hernia, pregnancy, intestinal ischemia, esophageal rupture, gastric volvulus, hepatitis.  Patient's comorbidities are as follows:  has a past medical history of Anemia, Hypertension, erosive gastritis,and Pre-diabetes (02/2022).  SDOH Screenings Depression (PHQ2-9): Low Risk  (03/05/2023) Tobacco Use: Low Risk  (06/20/2023)   Additional history gathered from review of EMR, past procedures including previous EGD, and family at bedside.  Patient given fluids, antiemetics, IV Pepcid and Protonix, GI cocktail with relief of pain and vomiting.   I discussed the case with  Dr. Marina Goodell who recommends admission, n.p.o. after midnight.  Discussed the case with Dr. Huel Cote who will admit the patient for the hospitalist service.  Patient will need a blood transfusion.  Patient is stable throughout her ED visit.   Amount and/or Complexity of Data Reviewed Labs: ordered. Decision-making details documented in ED Course.    Details: I reviewed all labs as reported in ED course. Patient has profound anemia likely gastric in origin.  Positive fecal occult stool with dark stool on examination.  Risk OTC drugs. Prescription drug management. Decision regarding hospitalization.           Final Clinical Impression(s) / ED Diagnoses Final diagnoses:  None    Rx / DC Orders ED Discharge Orders     None         Arthor Captain, PA-C 06/20/23 1827    Arby Barrette, MD 06/25/23 [id number]

## 2023-06-20 NOTE — ED Triage Notes (Signed)
Pt c/o epigastric for "a while." Recently treated for H. Pylori. Also c/o bloating and constipation

## 2023-06-20 NOTE — Assessment & Plan Note (Addendum)
-   Last A1c 6.5% on 12/05/2022 - SSI ordered in setting of TPN use  - once TPN fully off, will d/c SSI

## 2023-06-20 NOTE — H&P (Signed)
History and Physical    Patient: Linda [name] VHQ:[id number] DOB: October 05, 1966 DOA: 06/20/2023 DOS: the patient was seen and examined on 06/20/2023 PCP: Arnette Felts, [name]  Patient coming from: Home  Chief Complaint:  Chief Complaint  Patient presents with   Abdominal Pain   HPI: Linda [name] is a 57 y.o. female with medical history significant of recently treated H. pylori, hypertension, type 2 diabetes, iron deficiency anemia, morbid obesity, who presents to the ED due to abdominal pain.  Linda [name] states that she completed her H. pylori antibiotics approximately 2 weeks ago.  Then 1-1/2 weeks ago, she developed dyspnea on exertion and dizziness.  Over the last 24 hours, she has developed epigastric abdominal pain with multiple episodes of nonbilious, nonbloody vomiting.  She states that she infrequently has bowel movements, but when she does, they are very dark brown/black.  She has not seen any bright red blood per rectum.  She denies any fever, chills, chest pain, palpitations.  ED course: On arrival to the ED, patient was hypertensive at 152/70 with heart rate of 81.  She is saturating at 98% on room air.  She was afebrile at 98.4. Initial workup demonstrated WBC of 11.3, hemoglobin of 5.9, MCV of 68, platelets of 593; CMP notable only for glucose of 136.  GI was consulted with recommendations to transfer to [facility] or [facility].  TR[facility] contacted for admission.  Review of Systems: As mentioned in the history of present illness. All other systems reviewed and are negative.  Past Medical History:  Diagnosis Date   Anemia    Hypertension    Pre-diabetes 02/2022   Past Surgical History:  Procedure Laterality Date   BREAST LUMPECTOMY WITH RADIOACTIVE SEED LOCALIZATION Left 06/10/2022   Procedure: LEFT BREAST LUMPECTOMY WITH RADIOACTIVE SEED LOCALIZATION;  Surgeon: Griselda Miner, MD;  Location: [facility];  Service: General;  Laterality: Left;   CESAREAN  SECTION     x4   TUBAL LIGATION     Social History:  reports that she has never smoked. She has never used smokeless tobacco. She reports that she does not drink alcohol and does not use drugs.  No Known Allergies  Family History  Problem Relation Age of Onset   Memory loss Mother    Hypertension Father    Hypertension Brother    Diabetes Maternal Grandmother    Hypertension Maternal Grandmother    Hypertension Maternal Grandfather    Colon cancer Neg Hx    Stomach cancer Neg Hx    Esophageal cancer Neg Hx     Prior to Admission medications   Medication Sig Start Date End Date Taking? Authorizing Provider  amoxicillin (AMOXIL) 500 MG capsule Take 2 capsules (1,000 mg total) by mouth 2 (two) times daily for 14 days 05/19/23     clarithromycin (BIAXIN) 500 MG tablet Take 1 tablet (500 mg total) by mouth every 12 (twelve) hours for 14 days 05/19/23     Ferric Maltol (ACCRUFER) 30 MG CAPS Take 1 capsule (30 mg total) by mouth daily. 03/05/23   Arnette Felts, [name]  ferrous sulfate 325 (65 FE) MG tablet Take 1 tablet (325 mg total) by mouth daily. Patient not taking: Reported on 03/05/2023 02/22/22   Dione Booze, MD  fluticasone [facility]) 50 MCG/ACT nasal spray Place 2 sprays into both nostrils daily. Patient not taking: Reported on 03/05/2023 07/17/22   Henderly, Britni A, PA-C  lansoprazole (PREVACID) 30 MG capsule Take 1 capsule (30 mg total)  by mouth 2 (two) times daily for 14 days 05/19/23     linaclotide (LINZESS) 290 MCG CAPS capsule Take 1 capsule (290 mcg total) by mouth 20 minutes before breakfast 05/09/23     metFORMIN (GLUCOPHAGE) 500 MG tablet Take 1 tablet (500 mg total) by mouth 2 (two) times daily with a meal. Patient not taking: Reported on 03/05/2023 12/16/22   Arnette Felts, [name]  Semaglutide (RYBELSUS) 7 MG TABS Take 1 tablet (7 mg total) by mouth daily. 01/13/23   Arnette Felts, [name]  valsartan (DIOVAN) 160 MG tablet Take 1 tablet (160 mg total) by mouth daily. 10/03/22 10/03/23   Arnette Felts, [name]  Vitamin D, Ergocalciferol, (DRISDOL) 1.25 MG (50000 UNIT) CAPS capsule Take 1 capsule (50,000 Units total) by mouth 2 (two) times a week. 01/23/23   Arnette Felts, [name]    Physical Exam: Vitals:   06/20/23 1930 06/20/23 1939 06/20/23 2000 06/20/23 2049  BP: 133/74  137/65 (!) 142/70  Pulse: 81  89 90  Resp: (!) 25  (!) 26 18  Temp:  98.6 F (37 C)  98.8 F (37.1 C)  TempSrc:  Oral  Oral  SpO2: 98%  96% 100%   Physical Exam Vitals and nursing note reviewed.  Constitutional:      General: She is not in acute distress.    Appearance: She is obese. She is not toxic-appearing.  HENT:     Head: Normocephalic and atraumatic.     Mouth/Throat:     Mouth: Mucous membranes are moist.     Pharynx: Oropharynx is clear.  Cardiovascular:     Rate and Rhythm: Regular rhythm. Tachycardia present.  Pulmonary:     Effort: Pulmonary effort is normal. No respiratory distress.     Breath sounds: Normal breath sounds. No wheezing, rhonchi or rales.  Abdominal:     General: Bowel sounds are decreased.     Palpations: Abdomen is soft.     Tenderness: There is abdominal tenderness in the epigastric area. There is no guarding.     Hernia: No hernia is present.  Skin:    General: Skin is warm and dry.  Neurological:     General: No focal deficit present.     Mental Status: She is alert and oriented to person, place, and time.  Psychiatric:        Mood and Affect: Mood normal.        Behavior: Behavior normal.    Data Reviewed: CBC with WBC of 11.3, hemoglobin of 5.9, MCV of 68, platelets of 593 CMP with sodium of 138, potassium 3.5, bicarb 23, glucose 136, BUN 9, creatinine 0.70, AST 13, ALT 6 and GFR above 60 Pregnancy test negative FOBT positive INR 1.1  EKG was personally reviewed.  Sinus rhythm with rate of 85.  No axis deviation.  No ST or T wave changes concerning for acute ischemia.  There are no new results to review at this time.  Assessment and Plan:  *  Upper GI bleed Patient has a history of recently diagnosed H. pylori gastritis with endoscopy on 05/12/2023.  She was treated with clarithromycin, amoxicillin and lansoprazole for total of 14 days.  Unfortunately, patient is now presenting with nausea, vomiting, epigastric abdominal pain and melena on examination.  - Telemetry monitoring - Protonix 40 mg IV twice daily - GI consulted; appreciate their recommendations - Clear liquid diet and then n.p.o. after midnight  Acute on chronic blood loss anemia Patient has a history of chronic anemia dating back  1 year previously requiring a transfusion in May 2023.  Iron saturation panel obtained in February 2024 markedly low at 4%.  Most likely chronic blood loss in the setting of gastritis.  Currently acutely worsened with hemoglobin of 5.9.  - 1 unit of packed RBCs ordered - Posttransfusion CBC - Continue to transfuse for hemoglobin less than 7 - Repeat iron panel to assess need for possible IV iron prior to discharge  Type 2 diabetes mellitus with obesity (HCC) Patient has a history of well-controlled diabetes with last A1c of 6.5%.  She is on Rybelsus only.  No indication at this point for SSI.  - Hold home Rybelsus  Essential hypertension Patient has been hypertensive since arrival.  - Resume home antihypertensives  Advance Care Planning:   Code Status: Full Code verified by patient  Consults: Gastroenterology  Family Communication: No family at bedside  Severity of Illness: The appropriate patient status for this patient is INPATIENT. Inpatient status is judged to be reasonable and necessary in order to provide the required intensity of service to ensure the patient's safety. The patient's presenting symptoms, physical exam findings, and initial radiographic and laboratory data in the context of their chronic comorbidities is felt to place them at high risk for further clinical deterioration. Furthermore, it is not anticipated that the  patient will be medically stable for discharge from the hospital within 2 midnights of admission.   * I certify that at the point of admission it is my clinical judgment that the patient will require inpatient hospital care spanning beyond 2 midnights from the point of admission due to high intensity of service, high risk for further deterioration and high frequency of surveillance required.*  Author: Verdene Lennert, MD 06/20/2023 9:29 PM  For on call review [email or web address].

## 2023-06-20 NOTE — ED Notes (Signed)
Handoff report given to [facility] 

## 2023-06-20 NOTE — Assessment & Plan Note (Signed)
Patient has been hypertensive since arrival.  - Resume home antihypertensives

## 2023-06-21 ENCOUNTER — Inpatient Hospital Stay (HOSPITAL_COMMUNITY): Payer: BC Managed Care – PPO

## 2023-06-21 DIAGNOSIS — D509 Iron deficiency anemia, unspecified: Secondary | ICD-10-CM | POA: Diagnosis not present

## 2023-06-21 DIAGNOSIS — R1013 Epigastric pain: Secondary | ICD-10-CM

## 2023-06-21 DIAGNOSIS — Z8619 Personal history of other infectious and parasitic diseases: Secondary | ICD-10-CM

## 2023-06-21 DIAGNOSIS — R195 Other fecal abnormalities: Secondary | ICD-10-CM | POA: Diagnosis not present

## 2023-06-21 DIAGNOSIS — D500 Iron deficiency anemia secondary to blood loss (chronic): Secondary | ICD-10-CM | POA: Diagnosis not present

## 2023-06-21 DIAGNOSIS — K922 Gastrointestinal hemorrhage, unspecified: Secondary | ICD-10-CM | POA: Diagnosis not present

## 2023-06-21 LAB — BASIC METABOLIC PANEL
Anion gap: 10 (ref 5–15)
BUN: 12 mg/dL (ref 6–20)
CO2: 25 mmol/L (ref 22–32)
Calcium: 9.2 mg/dL (ref 8.9–10.3)
Chloride: 103 mmol/L (ref 98–111)
Creatinine, Ser: 0.9 mg/dL (ref 0.44–1.00)
GFR, Estimated: >60 mL/min (ref >60)
Glucose, Bld: 129 mg/dL — ABNORMAL HIGH (ref 70–99)
Potassium: 3.7 mmol/L (ref 3.5–5.1)
Sodium: 138 mmol/L (ref 135–145)

## 2023-06-21 LAB — CBC WITH DIFFERENTIAL/PLATELET
Abs Immature Granulocytes: 0.02 10*3/uL (ref 0.00–0.07)
Basophils Absolute: 0 10*3/uL (ref 0.0–0.1)
Basophils Relative: 0 %
Eosinophils Absolute: 0 10*3/uL (ref 0.0–0.5)
Eosinophils Relative: 0 %
HCT: 24.4 % — ABNORMAL LOW (ref 36.0–46.0)
Hemoglobin: 7 g/dL — ABNORMAL LOW (ref 12.0–15.0)
Immature Granulocytes: 0 %
Lymphocytes Relative: 9 %
Lymphs Abs: 0.8 10*3/uL (ref 0.7–4.0)
MCH: 20.5 pg — ABNORMAL LOW (ref 26.0–34.0)
MCHC: 28.7 g/dL — ABNORMAL LOW (ref 30.0–36.0)
MCV: 71.3 fL — ABNORMAL LOW (ref 80.0–100.0)
Monocytes Absolute: 0.9 10*3/uL (ref 0.1–1.0)
Monocytes Relative: 11 %
Neutro Abs: 6.5 10*3/uL (ref 1.7–7.7)
Neutrophils Relative %: 80 %
Platelets: 525 10*3/uL — ABNORMAL HIGH (ref 150–400)
RBC: 3.42 MIL/uL — ABNORMAL LOW (ref 3.87–5.11)
RDW: 20.5 % — ABNORMAL HIGH (ref 11.5–15.5)
WBC: 8.2 10*3/uL (ref 4.0–10.5)
nRBC: 0.2 % (ref 0.0–0.2)

## 2023-06-21 LAB — HIV ANTIBODY (ROUTINE TESTING W REFLEX): HIV Screen 4th Generation wRfx: NONREACTIVE

## 2023-06-21 LAB — PREPARE RBC (CROSSMATCH)

## 2023-06-21 MED ORDER — SODIUM CHLORIDE 0.9 % IV SOLN
250.0000 mg | Freq: Every day | INTRAVENOUS | Status: DC
  Administered 2023-06-21 – 2023-06-22 (×2): 250 mg via INTRAVENOUS
  Filled 2023-06-21 (×2): qty 20

## 2023-06-21 MED ORDER — SODIUM CHLORIDE 0.9% IV SOLUTION: Freq: Once | INTRAVENOUS | Status: AC

## 2023-06-21 NOTE — Consult Note (Signed)
[facility] CONSULTATION NOTE (covering for Dr. Elnoria Howard)  HISTORY OF PRESENT ILLNESS:  Linda [name] is a 57 y.o. female with past medical history as listed below who presents to the emergency department with a chief complaint of severe epigastric pain.  Her workup revealed anemia with a hemoglobin of 5.9 and MCV of 68.1.  Iron studies confirmed iron deficiency with a ferritin level of 8.  She was transfused with improvement in hemoglobin to 7.0 today.  Stool was positive for occult blood.  GI consultation requested.  She has had normal hemoglobin levels dating back to 2012 (13.0).  Hemoglobin in January 2024 was 9.3 with MCV 77.8.  In May 2023 she was noted to have severe iron deficiency anemia with a hemoglobin of 6.6.  She was referred to Dr. Leonides Schanz who performed colonoscopy and upper endoscopy March 27, 2022.  Colonoscopy was unremarkable including intubation of the terminal ileum.  Incidental diverticulosis and hemorrhoids noted.  Non-adenomatous polyps.  Upper endoscopy revealed gastritis.  Biopsies revealed Helicobacter pylori for which she was treated with quadruple therapy.  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  Patient tells me that she has had problems with intermittent epigastric pain for over a year.  It can occur at different times and last several hours.  It is often associated with nausea and vomiting.  Her most recent GI care has been under the direction of Dr. Elnoria Howard.  She underwent upper endoscopy with Dr. Elnoria Howard May 08, 2023.  We do not have a copy of that report.  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  She was treated with triple therapy for "H. pylori-like changes without H. pylori".  She stays on PPI and oral iron.  Has some problems with constipation related iron.  She has not had a bowel movement in several days.  No recent imaging.  Right upper quadrant ultrasound January 2024  revealed fatty liver.  No gallstones.  Multiple family members (her children) are in the room at time of my assessment    REVIEW OF SYSTEMS:  All non-GI ROS negative except for fatigue  Past Medical History:  Diagnosis Date   Anemia    Hypertension    Pre-diabetes 02/2022    Past Surgical History:  Procedure Laterality Date   BREAST LUMPECTOMY WITH RADIOACTIVE SEED LOCALIZATION Left 06/10/2022   Procedure: LEFT BREAST LUMPECTOMY WITH RADIOACTIVE SEED LOCALIZATION;  Surgeon: Griselda Miner, MD;  Location: [facility];  Service: General;  Laterality: Left;   CESAREAN SECTION     x4   TUBAL LIGATION      Social History Linda [name]  reports that she has never smoked. She has never used smokeless tobacco. She reports that she does not drink alcohol and does not use drugs.  family history includes Diabetes in her maternal grandmother; Hypertension in her brother, father, maternal grandfather, and maternal grandmother; Memory loss in her mother.  No Known Allergies     PHYSICAL EXAMINATION: Vital signs: BP 131/74 (BP Location: Left Arm)   Pulse 80   Temp 98.4 F (36.9 C) (Oral)   Resp 18   Ht 5\' 4"  (1.626 m)   Wt 93.7 kg   SpO2 97%   BMI 35.46 kg/m   Constitutional: generally well-appearing, no acute distress Psychiatric: alert and oriented x3, cooperative Eyes: extraocular movements intact, anicteric, conjunctiva pink Mouth: oral pharynx moist, no lesions Neck: supple no lymphadenopathy Cardiovascular: heart regular rate and rhythm, no murmur  Lungs: clear to auscultation bilaterally Abdomen: soft, minimal epigastric tenderness, nondistended, no obvious ascites, no peritoneal signs, normal bowel sounds, no organomegaly Rectal: Omitted Extremities: no clubbing, cyanosis, or lower extremity edema bilaterally Skin: no lesions on visible extremities Neuro: No focal deficits.  Cranial nerves intact  ASSESSMENT:  1.  Recurrent iron deficiency anemia  and Hemoccult positive stool.  Colonoscopy and upper endoscopy 2023 as described.  Most recent upper endoscopy with Dr. Elnoria Howard.  No evidence for acute bleeding. 2.  Recurrent epigastric pain.  Etiology unclear.  Story is most consistent with biliary colic.  Negative ultrasound in January 3.  General Medical problems.  Stable   PLAN:  1.  Daily PPI 2.  Consider IV iron infusion while she is hospitalized 3.  She will need outpatient monitoring with hematology regarding her chronic recurrent iron deficiency anemia 4.  We will perform repeat abdominal ultrasound to rule out gallstones 5.  If ultrasound is negative, would proceed with contrast-enhanced CT scan of the abdomen and pelvis 6.  No plans for repeat endoscopy given most recent exam last month no active bleeding. 7.  Dr. Elnoria Howard may want to consider capsule endoscopy to evaluate small bowel mucosa in a patient with recurrent iron deficiency anemia that is not clearly explained. 8.  Low-fat diet  I have explained my impressions and plans with the patient and her family in great detail.  All questions answered to their satisfaction.  Will follow.  Wilhemina Bonito. Eda Keys., M.D. [facility] Division of Gastroenterology

## 2023-06-21 NOTE — Hospital Course (Addendum)
57 yo with h/o anemia, HTN, and pre-DM who presented on 8/30 with abdominal pain.  S/p EGD on 05/08/2023, completed treatment for H pylori 2 weeks prior to admission. On oral iron and having dark stools. Hemoglobin was on admission.  RUQ US showed hepatic steatosis and hypoechoic region in the right lobe liver.  CT with large mass (4.7 x 3 cm) in RLQ and  large heterogenous mass superior to the cervix (7.9 x 6.1 cm).  Also with multiple dilated loops of bowel concerning for compression/obstruction.  She also has h/o DUB and thickened endometrium noted on outpatient ultrasound and  CT, declined outpatient endometrial biopsy. Consulted surgery, oncology, and gyn-oncology.  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  Drain placed 9/20 by IR growing E coli.

## 2023-06-21 NOTE — Progress Notes (Signed)
Progress Note    Linda [name]   URK:[id number]  DOB: Jul 06, 1966  DOA: 06/20/2023     1 PCP: Arnette Felts, [name]  Initial CC: dark stools, abdominal pain  Hospital Course: Linda [name] is a 57 yo female with PMH anemia, HTN, pre-DM who presented with abdominal pain.  She recently underwent EGD on 05/08/2023.  She was started on H. pylori treatment after procedure.  She completed treatment course approximately 2 weeks prior to admission. She has also been on oral iron and having dark stools. Due to worsening weakness and ongoing abdominal discomfort, she presented to the ER for further evaluation. Hemoglobin was found to be 5.9 g/dL.  She was admitted for blood transfusion and GI evaluation.  Interval History:  No events overnight.  Family present bedside as well.  Notably mentions the ongoing abdominal discomfort prior to admission along with weakness/lethargy.  Stools had remained dark in color but she also thought this was partially attributed to her iron pill at home as well. She is amenable with further blood if needed and IV iron.  Assessment and Plan: * Upper GI bleed - Possibly slow/chronic blood loss from underlying "chronic inactive gastritis" noted on EGD from 05/08/2023.  No H. pylori organisms were noted on immunohistochemical stain but due to pattern of inflammation, she was started on H. pylori treatment which she completed approximately 2 weeks ago -Stools have remained melanotic in appearance but she states she has also been on oral iron daily -Hemoglobin certainly low on admission, 5.9 g/dL.  Received 1 unit PRBC -Repeat Hgb 7 g/dL this morning.  Will give 1 more unit PRBC -GI consulted on admission as well, appreciate assistance.  Follow-up further recommendations - continue PPI  IDA (iron deficiency anemia) - iron labs: Iron 9, sat ratio 2%, TIBC 444. Ferritin 8 - s/p 2 units PRBC so far - will give Ferrlecit daily x 4 days. Should be full repletion along with PRBC  as well - needs repeat iron studies in 3-4 months - follow up further GI rec's in case of need for capsule study  Type 2 diabetes mellitus with obesity (HCC) - Patient has a history of well-controlled diabetes with last A1c of 6.5%.  She is on Rybelsus only.  No indication at this point for SSI. - Hold home Rybelsus  Essential hypertension - Continue ARB   Old records reviewed in assessment of this patient  Antimicrobials:   DVT prophylaxis:  SCDs Start: 06/20/23 2059   Code Status:   Code Status: Full Code  Mobility Assessment (Last 72 Hours)     Mobility Assessment     Row Name 06/21/23 0800 06/20/23 2050         Does patient have an order for bedrest or is patient medically unstable No - Continue assessment No - Continue assessment      What is the highest level of mobility based on the progressive mobility assessment? Level 6 (Walks independently in room and hall) - Balance while walking in room without assist - Complete Level 6 (Walks independently in room and hall) - Balance while walking in room without assist - Complete               Barriers to discharge: None Disposition Plan: Home Status is: Inpatient  Objective: Blood pressure (!) 122/59, pulse 66, temperature 97.9 F (36.6 C), temperature source Oral, resp. rate 18, height 5\' 4"  (1.626 m), weight 93.7 kg, SpO2 100%.  Examination:  Physical Exam Constitutional:  Appearance: Normal appearance.  HENT:     Head: Normocephalic and atraumatic.     Mouth/Throat:     Mouth: Mucous membranes are moist.  Eyes:     Extraocular Movements: Extraocular movements intact.  Cardiovascular:     Rate and Rhythm: Regular rhythm.  Pulmonary:     Effort: Pulmonary effort is normal. No respiratory distress.     Breath sounds: Normal breath sounds. No wheezing.  Abdominal:     General: Bowel sounds are normal. There is no distension.     Palpations: Abdomen is soft.     Tenderness: There is no abdominal  tenderness.  Musculoskeletal:        General: Normal range of motion.     Cervical back: Normal range of motion and neck supple.  Skin:    General: Skin is warm and dry.  Neurological:     General: No focal deficit present.     Mental Status: She is alert.  Psychiatric:        Mood and Affect: Mood normal.      Consultants:  GI  Procedures:    Data Reviewed: Results for orders placed or performed during the hospital encounter of 06/20/23 (from the past 24 hour(s))  Occult blood card to lab, stool Provider will collect     Status: Abnormal   Collection Time: 06/20/23  4:11 PM  Result Value Ref Range   Fecal Occult Bld POSITIVE (A) NEGATIVE  Pregnancy, urine     Status: None   Collection Time: 06/20/23  4:38 PM  Result Value Ref Range   Preg Test, Ur NEGATIVE NEGATIVE  Prepare RBC (crossmatch)     Status: None   Collection Time: 06/20/23  9:24 PM  Result Value Ref Range   Order Confirmation      ORDER PROCESSED BY BLOOD BANK Performed at [facility], [address] [place], [place] [address]   Type and screen [facility]     Status: None (Preliminary result)   Collection Time: 06/20/23 10:01 PM  Result Value Ref Range   ABO/RH(D) O POS    Antibody Screen NEG    Sample Expiration      06/23/2023,2359 Performed at [facility], [address] [place], [place] [address]    Unit Number [id number]    Blood Component Type RED CELLS,LR    Unit division 00    Status of Unit ISSUED    Transfusion Status OK TO TRANSFUSE    Crossmatch Result Compatible    Unit Number [id number]    Blood Component Type RED CELLS,LR    Unit division 00    Status of Unit ALLOCATED    Transfusion Status OK TO TRANSFUSE    Crossmatch Result Compatible   Iron and TIBC     Status: Abnormal   Collection Time: 06/20/23 10:12 PM  Result Value Ref Range   Iron 9 (L) 28 - 170 ug/dL   TIBC 629 528 - 413 ug/dL   Saturation Ratios 2  (L) 10.4 - 31.8 %   UIBC 435 ug/dL  Ferritin     Status: Abnormal   Collection Time: 06/20/23 10:12 PM  Result Value Ref Range   Ferritin 8 (L) 11 - 307 ng/mL  HIV Antibody (routine testing w rflx)     Status: None   Collection Time: 06/21/23  6:53 AM  Result Value Ref Range   HIV Screen 4th Generation wRfx Non Reactive Non Reactive  CBC with Differential/Platelet  Status: Abnormal   Collection Time: 06/21/23  6:53 AM  Result Value Ref Range   WBC 8.2 4.0 - 10.5 K/uL   RBC 3.42 (L) 3.87 - 5.11 MIL/uL   Hemoglobin 7.0 (L) 12.0 - 15.0 g/dL   HCT 78.2 (L) 95.6 - 21.3 %   MCV 71.3 (L) 80.0 - 100.0 fL   MCH 20.5 (L) 26.0 - 34.0 pg   MCHC 28.7 (L) 30.0 - 36.0 g/dL   RDW 08.6 (H) 57.8 - 46.9 %   Platelets 525 (H) 150 - 400 K/uL   nRBC 0.2 0.0 - 0.2 %   Neutrophils Relative % 80 %   Neutro Abs 6.5 1.7 - 7.7 K/uL   Lymphocytes Relative 9 %   Lymphs Abs 0.8 0.7 - 4.0 K/uL   Monocytes Relative 11 %   Monocytes Absolute 0.9 0.1 - 1.0 K/uL   Eosinophils Relative 0 %   Eosinophils Absolute 0.0 0.0 - 0.5 K/uL   Basophils Relative 0 %   Basophils Absolute 0.0 0.0 - 0.1 K/uL   Immature Granulocytes 0 %   Abs Immature Granulocytes 0.02 0.00 - 0.07 K/uL  Basic metabolic panel     Status: Abnormal   Collection Time: 06/21/23  6:53 AM  Result Value Ref Range   Sodium 138 135 - 145 mmol/L   Potassium 3.7 3.5 - 5.1 mmol/L   Chloride 103 98 - 111 mmol/L   CO2 25 22 - 32 mmol/L   Glucose, Bld 129 (H) 70 - 99 mg/dL   BUN 12 6 - 20 mg/dL   Creatinine, Ser 6.29 0.44 - 1.00 mg/dL   Calcium 9.2 8.9 - 52.8 mg/dL   GFR, Estimated >41 >32 mL/min   Anion gap 10 5 - 15  Prepare RBC (crossmatch)     Status: None   Collection Time: 06/21/23  1:44 PM  Result Value Ref Range   Order Confirmation      ORDER PROCESSED BY BLOOD BANK Performed at [facility] [address], [place], [place] [address]     I have reviewed pertinent nursing notes, vitals, labs, and images as  necessary. I have ordered labwork to follow up on as indicated.  I have reviewed the last notes from staff over past 24 hours. I have discussed patient's care plan and test results with nursing staff, CM/SW, and other staff as appropriate.  Time spent: Greater than 50% of the 55 minute visit was spent in counseling/coordination of care for the patient as laid out in the A&P.   LOS: 1 day   Lewie Chamber, MD [facility] 06/21/2023, 3:12 PM

## 2023-06-21 NOTE — Plan of Care (Signed)
  Problem: Education: Goal: Knowledge of General Education information will improve Description: Including pain rating scale, medication(s)/side effects and non-pharmacologic comfort measures Outcome: Progressing   Problem: Clinical Measurements: Goal: Will remain free from infection Outcome: Progressing   Problem: Clinical Measurements: Goal: Diagnostic test results will improve Outcome: Progressing   Problem: Nutrition: Goal: Adequate nutrition will be maintained Outcome: Progressing   

## 2023-06-22 ENCOUNTER — Inpatient Hospital Stay (HOSPITAL_COMMUNITY): Payer: BC Managed Care – PPO

## 2023-06-22 ENCOUNTER — Encounter (HOSPITAL_COMMUNITY): Payer: Self-pay | Admitting: Internal Medicine

## 2023-06-22 DIAGNOSIS — D500 Iron deficiency anemia secondary to blood loss (chronic): Secondary | ICD-10-CM | POA: Diagnosis not present

## 2023-06-22 DIAGNOSIS — K5900 Constipation, unspecified: Secondary | ICD-10-CM

## 2023-06-22 DIAGNOSIS — D509 Iron deficiency anemia, unspecified: Secondary | ICD-10-CM | POA: Diagnosis not present

## 2023-06-22 DIAGNOSIS — G8929 Other chronic pain: Secondary | ICD-10-CM

## 2023-06-22 DIAGNOSIS — R1013 Epigastric pain: Secondary | ICD-10-CM | POA: Diagnosis not present

## 2023-06-22 DIAGNOSIS — K56609 Unspecified intestinal obstruction, unspecified as to partial versus complete obstruction: Secondary | ICD-10-CM

## 2023-06-22 DIAGNOSIS — K922 Gastrointestinal hemorrhage, unspecified: Secondary | ICD-10-CM | POA: Diagnosis not present

## 2023-06-22 DIAGNOSIS — Z8619 Personal history of other infectious and parasitic diseases: Secondary | ICD-10-CM | POA: Diagnosis not present

## 2023-06-22 LAB — BASIC METABOLIC PANEL
Anion gap: 9 (ref 5–15)
BUN: 11 mg/dL (ref 6–20)
CO2: 25 mmol/L (ref 22–32)
Calcium: 8.9 mg/dL (ref 8.9–10.3)
Chloride: 102 mmol/L (ref 98–111)
Creatinine, Ser: 0.89 mg/dL (ref 0.44–1.00)
GFR, Estimated: >60 mL/min (ref >60)
Glucose, Bld: 114 mg/dL — ABNORMAL HIGH (ref 70–99)
Potassium: 3.7 mmol/L (ref 3.5–5.1)
Sodium: 136 mmol/L (ref 135–145)

## 2023-06-22 LAB — CBC WITH DIFFERENTIAL/PLATELET
Abs Immature Granulocytes: 0.12 10*3/uL — ABNORMAL HIGH (ref 0.00–0.07)
Basophils Absolute: 0 10*3/uL (ref 0.0–0.1)
Basophils Relative: 0 %
Eosinophils Absolute: 0.1 10*3/uL (ref 0.0–0.5)
Eosinophils Relative: 1 %
HCT: 28 % — ABNORMAL LOW (ref 36.0–46.0)
Hemoglobin: 8.1 g/dL — ABNORMAL LOW (ref 12.0–15.0)
Immature Granulocytes: 1 %
Lymphocytes Relative: 14 %
Lymphs Abs: 1.2 10*3/uL (ref 0.7–4.0)
MCH: 21.8 pg — ABNORMAL LOW (ref 26.0–34.0)
MCHC: 28.9 g/dL — ABNORMAL LOW (ref 30.0–36.0)
MCV: 75.3 fL — ABNORMAL LOW (ref 80.0–100.0)
Monocytes Absolute: 1 10*3/uL (ref 0.1–1.0)
Monocytes Relative: 11 %
Neutro Abs: 6.1 10*3/uL (ref 1.7–7.7)
Neutrophils Relative %: 73 %
Platelets: 501 10*3/uL — ABNORMAL HIGH (ref 150–400)
RBC: 3.72 MIL/uL — ABNORMAL LOW (ref 3.87–5.11)
RDW: 22 % — ABNORMAL HIGH (ref 11.5–15.5)
WBC: 8.4 10*3/uL (ref 4.0–10.5)
nRBC: 1.5 % — ABNORMAL HIGH (ref 0.0–0.2)

## 2023-06-22 LAB — MAGNESIUM: Magnesium: 2.6 mg/dL — ABNORMAL HIGH (ref 1.7–2.4)

## 2023-06-22 MED ORDER — IOHEXOL 9 MG/ML PO SOLN
500.0000 mL | ORAL | Status: AC
  Administered 2023-06-22 (×2): 500 mL via ORAL

## 2023-06-22 MED ORDER — TRAZODONE HCL 50 MG PO TABS: 50.0000 mg | ORAL_TABLET | Freq: Every evening | ORAL | Status: DC | PRN

## 2023-06-22 MED ORDER — IOHEXOL 300 MG/ML  SOLN
100.0000 mL | Freq: Once | INTRAMUSCULAR | Status: AC | PRN
  Administered 2023-06-22: 100 mL via INTRAVENOUS

## 2023-06-22 NOTE — Progress Notes (Signed)
Progress Note    Linda [name]   YQM:[id number]  DOB: April 21, 1966  DOA: 06/20/2023     2 PCP: Arnette Felts, [name]  Initial CC: dark stools, abdominal pain  Hospital Course: Linda [name] is a 57 yo female with PMH anemia, HTN, pre-DM who presented with abdominal pain.  Linda [name] recently underwent EGD on 05/08/2023.  Linda [name] was started on H. pylori treatment after procedure.  Linda [name] completed treatment course approximately 2 weeks prior to admission. Linda [name] has also been on oral iron and having dark stools. Due to worsening weakness and ongoing abdominal discomfort, Linda [name] presented to the ER for further evaluation. Hemoglobin was found to be 5.9 g/dL.  Linda [name] was admitted for blood transfusion and GI evaluation.  Interval History:  No events overnight.  Abdomen remains still somewhat distended.  Still not having much of a bowel movement nor any flatus.  Bowel regimen being pursued today as Linzess started.  Linda [name] went down for CT abdomen/pelvis this morning also.  Assessment and Plan: * Upper GI bleed - Possibly slow/chronic blood loss from underlying "chronic inactive gastritis" noted on EGD from 05/08/2023.  No H. pylori organisms were noted on immunohistochemical stain but due to pattern of inflammation, Linda [name] was started on H. pylori treatment which Linda [name] completed approximately 2 weeks ago -Stools have remained melanotic in appearance but Linda [name] states Linda [name] has also been on oral iron daily -Hemoglobin certainly low on admission, 5.9 g/dL.  Received 1 unit PRBC. Repeat Hgb 7g/dL therefore given 1 more unit (2 total for admission so far) - continue PPI - appreciate GI assistance; follow up CT A/P results   IDA (iron deficiency anemia) - iron labs: Iron 9, sat ratio 2%, TIBC 444. Ferritin 8 - s/p 2 units PRBC so far - will give Ferrlecit daily x 4 days. Should be full repletion along with PRBC as well - needs repeat iron studies in 3-4 months - follow up further GI rec's in case of need for capsule  study  Constipation - Abdomen remaining distended today.  Minimal flatus and no bowel movement in several days - Linzess added today per GI - Monitor for response and may need further laxatives if no effect  Type 2 diabetes mellitus with obesity (HCC) - Patient has a history of well-controlled diabetes with last A1c of 6.5%.  Linda [name] is on Rybelsus only.  No indication at this point for SSI. - Hold home Rybelsus  Essential hypertension - Continue ARB   Old records reviewed in assessment of this patient  Antimicrobials:   DVT prophylaxis:  SCDs Start: 06/20/23 2059   Code Status:   Code Status: Full Code  Mobility Assessment (Last 72 Hours)     Mobility Assessment     Row Name 06/22/23 0824 06/21/23 21:10:36 06/21/23 0800 06/20/23 2050     Does patient have an order for bedrest or is patient medically unstable No - Continue assessment No - Continue assessment No - Continue assessment No - Continue assessment    What is the highest level of mobility based on the progressive mobility assessment? Level 6 (Walks independently in room and hall) - Balance while walking in room without assist - Complete Level 6 (Walks independently in room and hall) - Balance while walking in room without assist - Complete Level 6 (Walks independently in room and hall) - Balance while walking in room without assist - Complete Level 6 (Walks independently in room and hall) - Balance while walking in room without assist - Complete  Barriers to discharge: None Disposition Plan: Home Status is: Inpatient  Objective: Blood pressure (!) 159/76, pulse 68, temperature 98.1 F (36.7 C), temperature source Oral, resp. rate 18, height 5\' 4"  (1.626 m), weight 93.7 kg, SpO2 99%.  Examination:  Physical Exam Constitutional:      Appearance: Normal appearance.  HENT:     Head: Normocephalic and atraumatic.     Mouth/Throat:     Mouth: Mucous membranes are moist.  Eyes:     Extraocular  Movements: Extraocular movements intact.  Cardiovascular:     Rate and Rhythm: Regular rhythm.  Pulmonary:     Effort: Pulmonary effort is normal. No respiratory distress.     Breath sounds: Normal breath sounds. No wheezing.  Abdominal:     General: Bowel sounds are decreased. There is distension.     Palpations: Abdomen is soft.     Tenderness: There is no abdominal tenderness.  Musculoskeletal:        General: Normal range of motion.     Cervical back: Normal range of motion and neck supple.  Skin:    General: Skin is warm and dry.  Neurological:     General: No focal deficit present.     Mental Status: Linda [name] is alert.  Psychiatric:        Mood and Affect: Mood normal.      Consultants:  GI  Procedures:    Data Reviewed: Results for orders placed or performed during the hospital encounter of 06/20/23 (from the past 24 hour(s))  Basic metabolic panel     Status: Abnormal   Collection Time: 06/22/23  6:19 AM  Result Value Ref Range   Sodium 136 135 - 145 mmol/L   Potassium 3.7 3.5 - 5.1 mmol/L   Chloride 102 98 - 111 mmol/L   CO2 25 22 - 32 mmol/L   Glucose, Bld 114 (H) 70 - 99 mg/dL   BUN 11 6 - 20 mg/dL   Creatinine, Ser 1.61 0.44 - 1.00 mg/dL   Calcium 8.9 8.9 - 09.6 mg/dL   GFR, Estimated >04 >54 mL/min   Anion gap 9 5 - 15  CBC with Differential/Platelet     Status: Abnormal   Collection Time: 06/22/23  6:19 AM  Result Value Ref Range   WBC 8.4 4.0 - 10.5 K/uL   RBC 3.72 (L) 3.87 - 5.11 MIL/uL   Hemoglobin 8.1 (L) 12.0 - 15.0 g/dL   HCT 09.8 (L) 11.9 - 14.7 %   MCV 75.3 (L) 80.0 - 100.0 fL   MCH 21.8 (L) 26.0 - 34.0 pg   MCHC 28.9 (L) 30.0 - 36.0 g/dL   RDW 82.9 (H) 56.2 - 13.0 %   Platelets 501 (H) 150 - 400 K/uL   nRBC 1.5 (H) 0.0 - 0.2 %   Neutrophils Relative % 73 %   Neutro Abs 6.1 1.7 - 7.7 K/uL   Lymphocytes Relative 14 %   Lymphs Abs 1.2 0.7 - 4.0 K/uL   Monocytes Relative 11 %   Monocytes Absolute 1.0 0.1 - 1.0 K/uL   Eosinophils Relative 1  %   Eosinophils Absolute 0.1 0.0 - 0.5 K/uL   Basophils Relative 0 %   Basophils Absolute 0.0 0.0 - 0.1 K/uL   Immature Granulocytes 1 %   Abs Immature Granulocytes 0.12 (H) 0.00 - 0.07 K/uL   Polychromasia PRESENT    Ovalocytes PRESENT   Magnesium     Status: Abnormal   Collection Time: 06/22/23  6:19 AM  Result  Value Ref Range   Magnesium 2.6 (H) 1.7 - 2.4 mg/dL    I have reviewed pertinent nursing notes, vitals, labs, and images as necessary. I have ordered labwork to follow up on as indicated.  I have reviewed the last notes from staff over past 24 hours. I have discussed patient's care plan and test results with nursing staff, CM/SW, and other staff as appropriate.  Time spent: Greater than 50% of the 55 minute visit was spent in counseling/coordination of care for the patient as laid out in the A&P.   LOS: 2 days   Lewie Chamber, MD [facility] 06/22/2023, 3:05 PM

## 2023-06-22 NOTE — Consult Note (Signed)
Surgical Evaluation Requesting provider: Dr. Lewie Chamber  Chief Complaint: Abdominal pain  HPI: This is a very pleasant 57 year old woman with a history of prediabetes, hypertension, and iron deficiency anemia.  She has been having abdominal pain, bloating, and constipation issues for about a year, intermittently associated with nausea and vomiting.  Has tried multiple different bowel regimens including Linzess without much improvement.  Developed more severe vomiting about 4 days ago.  She was found to be quite anemic with a hemoglobin of 5.9 and received a unit of blood with appropriate response.  She was noted to have severe iron deficiency anemia with a hemoglobin of 6.6 in May 2023, and had upper endoscopy and colonoscopy on 03/27/2022 with Dr. Thedora Hinders erosive gastropathy was noted on the upper with path notable for positive H. pylori which was subsequently treated. The colonoscopy revealed 2 3-27mm polyps in the sigmoid and transverse colon which were removed, diverticulosis and nonbleeding internal hemorrhoids but otherwise negative.  Pathology revealed a hyperplastic polyp and an inflammatory polyp with no dysplasia.  She had a transvaginal ultrasound on 04/18/2022 with a listed diagnosis of dysfunctional uterine bleeding-this noted an endometrial stripe of 6 mm but otherwise normal sonographic appearance of the uterus and left ovary, right ovary was not visualized but no adnexal mass or free fluid was present.  She and her gynecologist, Dr. Alysia Penna, discussed endometrial biopsy but at the time it seemed that her bleeding had subsided and plans were made to follow bleeding pattern.  Had a right upper quadrant ultrasound in January of this year which demonstrated hepatic steatosis, focal fatty sparing along the gallbladder fossa, normal common bile duct diameter, no gallstones or gallbladder wall thickening.  Repeat ultrasound yesterday additionally notes a new indistinct 2.5 x 2.3 x 1.8 cm  hypoechoic region and a small right pleural effusion.  She had a repeat upper endoscopy with Dr. Elnoria Howard in July of this year; biopsy showed chronic inactive gastritis and negative for H. pylori organisms though the inflammatory pattern was concerning for H. pylori gastritis, so it sounds like she was treated again.  CT scan earlier today notes small bowel obstruction can [place] to a complex right lower quadrant mass may be related to the right ovary, along with the aforementioned right posterior liver lobe lesion worrisome for metastatic disease, a 2.9 x 2.2 cm mesenteric nodule may reflect enlarged lymph node, mildly thickened appearing and heterogenous endometrium, and a large heterogenous mass superior to the cervix not clearly related to either ovary; additionally noted multiple peritoneal nodules suspicious for carcinomatosis including 1 on the left mid abdomen associated with an adjacent 8.5 x 7.3 cm cystic structure.   No Known Allergies  Past Medical History:  Diagnosis Date   Anemia    Hypertension    Pre-diabetes 02/2022    Past Surgical History:  Procedure Laterality Date   BREAST LUMPECTOMY WITH RADIOACTIVE SEED LOCALIZATION Left 06/10/2022   Procedure: LEFT BREAST LUMPECTOMY WITH RADIOACTIVE SEED LOCALIZATION;  Surgeon: Griselda Miner, MD;  Location: [facility];  Service: General;  Laterality: Left;   CESAREAN SECTION     x4   TUBAL LIGATION      Family History  Problem Relation Age of Onset   Memory loss Mother    Hypertension Father    Hypertension Brother    Diabetes Maternal Grandmother    Hypertension Maternal Grandmother    Hypertension Maternal Grandfather    Colon cancer Neg Hx    Stomach cancer Neg Hx  Esophageal cancer Neg Hx     Social History   Socioeconomic History   Marital status: Single    Spouse name: Not on file   Number of children: 4   Years of education: Not on file   Highest education level: Not on file  Occupational  History    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  Tobacco Use   Smoking status: Never   Smokeless tobacco: Never  Vaping Use   Vaping status: Never Used  Substance and Sexual Activity   Alcohol use: No   Drug use: No   Sexual activity: Not Currently    Birth control/protection: Surgical  Other Topics Concern   Not on file  Social History Narrative   Not on file   Social Determinants of Health   Financial Resource Strain: Not on file  Food Insecurity: No Food Insecurity (06/20/2023)   Hunger Vital Sign    Worried About Running Out of Food in the Last Year: Never true    Ran Out of Food in the Last Year: Never true  Transportation Needs: No Transportation Needs (06/20/2023)   PRAPARE - Administrator, Civil Service (Medical): No    Lack of Transportation (Non-Medical): No  Physical Activity: Not on file  Stress: Not on file  Social Connections: Not on file    No current facility-administered medications on file prior to encounter.   Current Outpatient Medications on File Prior to Encounter  Medication Sig Dispense Refill   Ferric Maltol (ACCRUFER) 30 MG CAPS Take 1 capsule (30 mg total) by mouth daily. 90 capsule 1   linaclotide (LINZESS) 290 MCG CAPS capsule Take 1 capsule (290 mcg total) by mouth 20 minutes before breakfast 90 capsule 3   Semaglutide (RYBELSUS) 7 MG TABS Take 1 tablet (7 mg total) by mouth daily. 90 tablet 1   valsartan (DIOVAN) 160 MG tablet Take 1 tablet (160 mg total) by mouth daily. 90 tablet 1   amoxicillin (AMOXIL) 500 MG capsule Take 2 capsules (1,000 mg total) by mouth 2 (two) times daily for 14 days (Patient not taking: Reported on 06/20/2023) 56 capsule 0   clarithromycin (BIAXIN) 500 MG tablet Take 1 tablet (500 mg total) by mouth every 12 (twelve) hours for 14 days (Patient not taking: Reported on 06/20/2023) 28 tablet 0   ferrous sulfate 325 (65 FE) MG tablet Take 1 tablet (325 mg total) by mouth daily. (Patient not taking:  Reported on 03/05/2023) 30 tablet 0   fluticasone (FLONASE) 50 MCG/ACT nasal spray Place 2 sprays into both nostrils daily. (Patient not taking: Reported on 03/05/2023) 9.9 mL 0   lansoprazole (PREVACID) 30 MG capsule Take 1 capsule (30 mg total) by mouth 2 (two) times daily for 14 days (Patient not taking: Reported on 06/20/2023) 28 capsule 0   Vitamin D, Ergocalciferol, (DRISDOL) 1.25 MG (50000 UNIT) CAPS capsule Take 1 capsule (50,000 Units total) by mouth 2 (two) times a week. (Patient not taking: Reported on 06/20/2023) 24 capsule 1    Review of Systems: a complete, 10pt review of systems was completed with pertinent positives and negatives as documented in the HPI  Physical Exam: Vitals:   06/22/23 0454 06/22/23 1301  BP: 130/64 (!) 159/76  Pulse: 63 68  Resp: 18 18  Temp: 98.5 F (36.9 C) 98.1 F (36.7 C)  SpO2: 97% 99%   Gen: A&Ox3, no distress  Eyes: lids and conjunctivae normal, no icterus.  Chest: respiratory effort is normal. Cardiovascular: RRR with palpable distal  pulses, no pedal edema Gastrointestinal: Soft, diffusely mildly tender without peritoneal signs, very distended Muscoloskeletal: no clubbing or cyanosis of the fingers.  No gross deformity Neuro: cranial nerves grossly intact.  Sensation intact to light touch diffusely. Psych: appropriate mood and affect, normal insight/judgment intact  Skin: warm and dry      Latest Ref Rng & Units 06/22/2023    6:19 AM 06/21/2023    6:53 AM 06/20/2023    2:36 PM  CBC  WBC 4.0 - 10.5 K/uL 8.4  8.2  11.3   Hemoglobin 12.0 - 15.0 g/dL 8.1  7.0  5.9   Hematocrit 36.0 - 46.0 % 28.0  24.4  22.0   Platelets 150 - 400 K/uL 501  525  593        Latest Ref Rng & Units 06/22/2023    6:19 AM 06/21/2023    6:53 AM 06/20/2023    2:36 PM  CMP  Glucose 70 - 99 mg/dL 563  875  643   BUN 6 - 20 mg/dL 11  12  9    Creatinine 0.44 - 1.00 mg/dL 3.29  5.18  8.41   Sodium 135 - 145 mmol/L 136  138  138   Potassium 3.5 - 5.1 mmol/L 3.7  3.7   3.5   Chloride 98 - 111 mmol/L 102  103  102   CO2 22 - 32 mmol/L 25  25  23    Calcium 8.9 - 10.3 mg/dL 8.9  9.2  9.0   Total Protein 6.5 - 8.1 g/dL   8.7   Total Bilirubin 0.3 - 1.2 mg/dL   0.6   Alkaline Phos 38 - 126 U/L   78   AST 15 - 41 U/L   13   ALT 0 - 44 U/L   6     Lab Results  Component Value Date   INR 1.1 06/20/2023    Imaging: CT ABDOMEN PELVIS W CONTRAST  Result Date: 06/22/2023 CLINICAL DATA:  Epigastric pain. Indeterminate right liver lesion on ultrasound. * Tracking Code: BO * EXAM: CT ABDOMEN AND PELVIS WITH CONTRAST TECHNIQUE: Multidetector CT imaging of the abdomen and pelvis was performed using the standard protocol following bolus administration of intravenous contrast. RADIATION DOSE REDUCTION: This exam was performed according to the departmental dose-optimization program which includes automated exposure control, adjustment of the mA and/or kV according to patient size and/or use of iterative reconstruction technique. CONTRAST:  OMNIPAQUE IOHEXOL 300 MG/ML  SOLN COMPARISON:  Abdominal ultrasound 06/21/2023 and 10/29/2022. FINDINGS: Lower chest: Small right pleural effusion with dependent right lower lobe atelectasis. The left lung base is clear. Hepatobiliary: The liver is normal in density without definite signs of steatosis or morphologic changes of cirrhosis. There is an indeterminate hypodense lesion posteriorly in the right lobe measuring 1.3 x 1.3 cm on image 26/2, corresponding with the ultrasound finding. Although nonspecific by this examination, this is worrisome for metastatic disease based on additional findings described below. Possible additional tiny lesion more inferiorly in the right lobe on image 34/2. No evidence of gallstones, gallbladder wall thickening or biliary dilatation. Pancreas: Unremarkable. No pancreatic ductal dilatation or surrounding inflammatory changes. Spleen: Normal in size without focal abnormality. Adrenals/Urinary Tract: Both  adrenal glands appear normal. No evidence of urinary tract calculus, suspicious renal lesion or hydronephrosis. Mild renal cortical scarring on the right. The bladder appears unremarkable for its degree of distention. Stomach/Bowel: Enteric contrast was administered and has passed into the mid small bowel. The stomach and proximal  small bowel appear normal. There are multiple moderately dilated loops of mid to distal small bowel with air-levels consistent with a distal small-bowel obstruction. Transition point is in the right lower quadrant and likely associated with a complex mass measuring approximately 4.7 x 3.0 cm on image 79/2. The distal small bowel is decompressed. The appendix is not well visualized, although demonstrates no definite abnormality. The colon is decompressed without wall thickening or focal abnormality. There are mild diverticular changes throughout the descending and sigmoid colon. Vascular/Lymphatic: 2.9 x 2.2 cm mesenteric nodule on image 50/2 may reflect an enlarged lymph node. No other enlarged abdominal or pelvic lymph nodes. No significant vascular findings. No evidence of aneurysm or large vessel occlusion. Reproductive: The endometrium appears mildly thickened and heterogeneous, measuring up to 1.5 cm in thickness on sagittal image 66/6. No definite myometrial abnormality. There is a large heterogeneous mass superior to the cervix which measures 7.9 x 6.1 cm on image 81/2. This is not clearly related to either ovary. The right lower quadrant mass described above causing apparent distal small bowel obstruction may be related to the right ovary. No left ovarian mass identified. Other: No evidence of abdominal wall hernia, generalized ascites or pneumoperitoneum. There are multiple peritoneal nodules, highly suspicious for carcinomatosis. Representative lesions include a 2.1 cm lesion deep to the umbilicus on image 70/2, a 3.1 x 2.4 cm lesion in the right lower quadrant on image 71/2 and  anterior left upper quadrant lesion measuring 2.0 x 1.4 cm lesion on image 53/2. Enhancing nodule measuring 2.8 cm in the left mid abdomen is associated with an adjacent cystic structure measuring up to 8.5 x 7.3 cm on image 64/2, possibly an area of loculated ascites. Additional pelvic masses are described above. Musculoskeletal: No acute or significant osseous findings. Mild degenerative changes of both hips. Unless specific follow-up recommendations are mentioned in the findings or impression sections, no imaging follow-up of any mentioned incidental findings is recommended. IMPRESSION: 1. Distal small-bowel obstruction with transition point in the right lower quadrant, likely associated with a complex mass in the right lower quadrant. Patient may benefit from nasogastric tube decompression and general surgical consultation. 2. Multiple peritoneal nodules, highly suspicious for carcinomatosis. Dominant heterogeneous mass superior to the cervix is not clearly related to either ovary and could reflect a drop metastasis or atypical primary pelvic malignancy. Associated mildly thickened and heterogeneous endometrium. No definite other primary malignancy identified in the abdomen or pelvis. 3. Indeterminate hypodense lesion posteriorly in the right lobe of the liver, corresponding with the ultrasound finding, worrisome for metastatic disease based on the other findings. 4. Small right pleural effusion with dependent right lower lobe atelectasis. 5. Appropriate oncology referral recommended for additional diagnosis and staging. PET-CT may be helpful. Electronically Signed   By: Carey Bullocks M.D.   On: 06/22/2023 15:02   US ABDOMEN LIMITED RUQ (LIVER/GB)  Result Date: 06/21/2023 CLINICAL DATA:  Epigastric pain for 3 days EXAM: ULTRASOUND ABDOMEN LIMITED RIGHT UPPER QUADRANT COMPARISON:  10/29/2022 FINDINGS: Gallbladder: No gallstones or wall thickening visualized. No sonographic Murphy sign noted by sonographer.  Common bile duct: Diameter: 3 mm Liver: Diffuse increased liver echotexture compatible with hepatic steatosis. There is a focal area of fatty sparing near the gallbladder fossa. Within the right lobe liver there is an indistinct 2.5 x 2.3 x 1.8 cm hypoechoic region, nonspecific. No intrahepatic duct dilation. Portal vein is patent on color Doppler imaging with normal direction of blood flow towards the liver. Other: Small right pleural  effusion incidentally noted. IMPRESSION: 1. Hepatic steatosis, with focal fatty sparing near the gallbladder fossa. 2. Indeterminate 2.5 cm hypoechoic region within the right lobe liver, not seen on prior imaging. When the patient is clinically stable and able to follow directions and hold their breath (preferably as an outpatient) further evaluation with dedicated abdominal MRI should be considered. 3. Right pleural effusion. Electronically Signed   By: Sharlet Salina M.D.   On: 06/21/2023 16:40     A/P: 57 year old woman with progressive obstruction related to a newly identified pelvic mass.  Suspect gynecologic origin given negative colonoscopy last year; will defer to GI team whether repeat colonoscopic evaluation is warranted.    At this point I discussed with the patient NG tube would likely be very helpful to decompress her and give her some relief from the bloating, discomfort and nausea that she is experiencing while further workup is pursued.  She and her family will talk further about this and consider it.    I discussed with Dr. Frederick Peers sending tumor markers and would also recommend considering gyn-onc consultation to guide further imaging/workup if indicated.   General surgery team will follow.    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

## 2023-06-22 NOTE — Plan of Care (Signed)
°  Problem: Coping: °Goal: Level of anxiety will decrease °Outcome: Progressing °  °

## 2023-06-22 NOTE — Plan of Care (Signed)
  Problem: Clinical Measurements: Goal: Will remain free from infection Outcome: Progressing Goal: Diagnostic test results will improve Outcome: Progressing   Problem: Pain Managment: Goal: General experience of comfort will improve Outcome: Progressing   

## 2023-06-22 NOTE — Progress Notes (Signed)
HISTORY OF PRESENT ILLNESS:  Linda [name] is a 57 y.o. female who was admitted to the hospital with abdominal pain and anemia.  Recent treatment for H. pylori.  Ultrasound yesterday was acute findings.  CT scan today completed but pending.  Hemoglobin stable at 8.1.  Basic metabolic panel unremarkable.  Patient states she is feeling about the same.  Asked for pain medication.  Eating okay.  Son in room  REVIEW OF SYSTEMS:  All non-GI ROS negative.  Past Medical History:  Diagnosis Date   Anemia    Hypertension    Pre-diabetes 02/2022    Past Surgical History:  Procedure Laterality Date   BREAST LUMPECTOMY WITH RADIOACTIVE SEED LOCALIZATION Left 06/10/2022   Procedure: LEFT BREAST LUMPECTOMY WITH RADIOACTIVE SEED LOCALIZATION;  Surgeon: Griselda Miner, MD;  Location: [facility];  Service: General;  Laterality: Left;   CESAREAN SECTION     x4   TUBAL LIGATION      Social History Linda [name]  reports that she has never smoked. She has never used smokeless tobacco. She reports that she does not drink alcohol and does not use drugs.  family history includes Diabetes in her maternal grandmother; Hypertension in her brother, father, maternal grandfather, and maternal grandmother; Memory loss in her mother.  No Known Allergies     PHYSICAL EXAMINATION: Vital signs: BP 130/64 (BP Location: Right Arm)   Pulse 63   Temp 98.5 F (36.9 C) (Oral)   Resp 18   Ht 5\' 4"  (1.626 m)   Wt 93.7 kg   SpO2 97%   BMI 35.46 kg/m  General: Well-developed, well-nourished, no acute distress HEENT: Sclerae are anicteric, conjunctiva pink. Oral mucosa intact Lungs: Clear Heart: Regular Abdomen: soft, nontender, nondistended, no obvious ascites, no peritoneal signs, normal bowel sounds. No organomegaly. Extremities: No edema Psychiatric: alert and oriented x3. Cooperative     ASSESSMENT:   1.  Recurrent iron deficiency anemia and Hemoccult positive stool.   Colonoscopy and upper endoscopy 2023 as previously described.  Very recent upper endoscopy with Dr. Elnoria Howard.  No evidence for acute bleeding. 2.  Recurrent epigastric pain.  Etiology unclear.  Story is most consistent with biliary colic.  Negative ultrasound in January.  Repeat ultrasound yesterday was negative.  CT scan pending. 3.  General Medical problems.  Stable     PLAN:   1.  Continue Daily PPI 2.  Consider IV iron infusion while she is hospitalized 3.  She will need outpatient monitoring with hematology regarding her chronic recurrent iron deficiency anemia 4.  We will on CT scan findings when available 5.  No plans for repeat endoscopy given most recent exam last month no active bleeding. 6.  Dr. Elnoria Howard may want to consider capsule endoscopy to evaluate small bowel mucosa in a patient with recurrent iron deficiency anemia that is not clearly explained. 7.  Continue low-fat diet  Linda [name]., M.D. [facility] of Gastroenterology

## 2023-06-22 NOTE — Assessment & Plan Note (Addendum)
-   Presented with distended abdomen and decreased bowel sounds.  Initially suspected constipation contributing but after CT, concern is now for underlying obstruction which may be due to mass effect - general surgery following as well - now s/p ex-lap with small bowel resection with primary anastomosis and excisional biopsy of pelvic, multi abdominal wall, and omental masses on 06/30/2023 -continue TPN for now; will eventually wean - had a BM 9/12, still minimal flatus; diet advanced to FLD per surgery today (monitor for tolerance, intake still low); continue bowel regimen

## 2023-06-23 DIAGNOSIS — C189 Malignant neoplasm of colon, unspecified: Secondary | ICD-10-CM | POA: Insufficient documentation

## 2023-06-23 DIAGNOSIS — D500 Iron deficiency anemia secondary to blood loss (chronic): Secondary | ICD-10-CM | POA: Diagnosis not present

## 2023-06-23 DIAGNOSIS — K922 Gastrointestinal hemorrhage, unspecified: Secondary | ICD-10-CM | POA: Diagnosis not present

## 2023-06-23 DIAGNOSIS — R935 Abnormal findings on diagnostic imaging of other abdominal regions, including retroperitoneum: Secondary | ICD-10-CM

## 2023-06-23 DIAGNOSIS — R1900 Intra-abdominal and pelvic swelling, mass and lump, unspecified site: Secondary | ICD-10-CM | POA: Diagnosis not present

## 2023-06-23 DIAGNOSIS — K56609 Unspecified intestinal obstruction, unspecified as to partial versus complete obstruction: Secondary | ICD-10-CM

## 2023-06-23 DIAGNOSIS — D509 Iron deficiency anemia, unspecified: Secondary | ICD-10-CM | POA: Diagnosis not present

## 2023-06-23 LAB — BPAM RBC
Blood Product Expiration Date: 202409252359
Blood Product Expiration Date: 202409252359
ISSUE DATE / TIME: 202408310020
ISSUE DATE / TIME: 202408311526
Unit Type and Rh: 5100
Unit Type and Rh: 5100

## 2023-06-23 LAB — TYPE AND SCREEN
ABO/RH(D): O POS
Antibody Screen: NEGATIVE
Unit division: 0
Unit division: 0

## 2023-06-23 LAB — BASIC METABOLIC PANEL
Anion gap: 10 (ref 5–15)
BUN: 12 mg/dL (ref 6–20)
CO2: 23 mmol/L (ref 22–32)
Calcium: 9 mg/dL (ref 8.9–10.3)
Chloride: 99 mmol/L (ref 98–111)
Creatinine, Ser: 0.91 mg/dL (ref 0.44–1.00)
GFR, Estimated: >60 mL/min (ref >60)
Glucose, Bld: 86 mg/dL (ref 70–99)
Potassium: 3.4 mmol/L — ABNORMAL LOW (ref 3.5–5.1)
Sodium: 132 mmol/L — ABNORMAL LOW (ref 135–145)

## 2023-06-23 LAB — CBC WITH DIFFERENTIAL/PLATELET
Abs Immature Granulocytes: 0.09 10*3/uL — ABNORMAL HIGH (ref 0.00–0.07)
Basophils Absolute: 0 10*3/uL (ref 0.0–0.1)
Basophils Relative: 0 %
Eosinophils Absolute: 0 10*3/uL (ref 0.0–0.5)
Eosinophils Relative: 0 %
HCT: 29.8 % — ABNORMAL LOW (ref 36.0–46.0)
Hemoglobin: 8.3 g/dL — ABNORMAL LOW (ref 12.0–15.0)
Immature Granulocytes: 1 %
Lymphocytes Relative: 12 %
Lymphs Abs: 1.4 10*3/uL (ref 0.7–4.0)
MCH: 21.2 pg — ABNORMAL LOW (ref 26.0–34.0)
MCHC: 27.9 g/dL — ABNORMAL LOW (ref 30.0–36.0)
MCV: 76 fL — ABNORMAL LOW (ref 80.0–100.0)
Monocytes Absolute: 1.2 10*3/uL — ABNORMAL HIGH (ref 0.1–1.0)
Monocytes Relative: 10 %
Neutro Abs: 9.4 10*3/uL — ABNORMAL HIGH (ref 1.7–7.7)
Neutrophils Relative %: 77 %
Platelets: 534 10*3/uL — ABNORMAL HIGH (ref 150–400)
RBC: 3.92 MIL/uL (ref 3.87–5.11)
RDW: 23.8 % — ABNORMAL HIGH (ref 11.5–15.5)
WBC: 12.2 10*3/uL — ABNORMAL HIGH (ref 4.0–10.5)
nRBC: 1.2 % — ABNORMAL HIGH (ref 0.0–0.2)

## 2023-06-23 LAB — MAGNESIUM: Magnesium: 2.6 mg/dL — ABNORMAL HIGH (ref 1.7–2.4)

## 2023-06-23 MED ORDER — CHLORHEXIDINE GLUCONATE CLOTH 2 % EX PADS
6.0000 | MEDICATED_PAD | Freq: Every day | CUTANEOUS | Status: DC
  Administered 2023-06-23 – 2023-07-16 (×23): 6 via TOPICAL

## 2023-06-23 MED ORDER — POTASSIUM CHLORIDE 10 MEQ/100ML IV SOLN
10.0000 meq | INTRAVENOUS | Status: AC
  Administered 2023-06-23: 10 meq via INTRAVENOUS
  Filled 2023-06-23: qty 100

## 2023-06-23 MED ORDER — SODIUM CHLORIDE 0.9% FLUSH: 10.0000 mL | INTRAVENOUS | Status: DC | PRN

## 2023-06-23 MED ORDER — TRAVASOL 10 % IV SOLN
INTRAVENOUS | Status: AC
  Filled 2023-06-23: qty 480

## 2023-06-23 MED ORDER — INSULIN ASPART 100 UNIT/ML IJ SOLN
0.0000 [IU] | Freq: Four times a day (QID) | INTRAMUSCULAR | Status: DC
  Administered 2023-06-24: 2 [IU] via SUBCUTANEOUS
  Administered 2023-06-24: 1 [IU] via SUBCUTANEOUS
  Administered 2023-06-24 – 2023-06-25 (×2): 2 [IU] via SUBCUTANEOUS
  Administered 2023-06-25 (×2): 1 [IU] via SUBCUTANEOUS
  Administered 2023-06-25: 2 [IU] via SUBCUTANEOUS
  Administered 2023-06-26 – 2023-06-28 (×8): 1 [IU] via SUBCUTANEOUS

## 2023-06-23 MED ORDER — LABETALOL HCL 5 MG/ML IV SOLN: 10.0000 mg | INTRAVENOUS | Status: DC | PRN

## 2023-06-23 MED ORDER — HYDRALAZINE HCL 20 MG/ML IJ SOLN: 10.0000 mg | INTRAMUSCULAR | Status: DC | PRN

## 2023-06-23 MED ORDER — SODIUM CHLORIDE 0.9% FLUSH
10.0000 mL | Freq: Two times a day (BID) | INTRAVENOUS | Status: DC
  Administered 2023-06-23 – 2023-06-26 (×7): 10 mL
  Administered 2023-06-27: 20 mL
  Administered 2023-06-27 – 2023-07-10 (×24): 10 mL
  Administered 2023-07-11: 20 mL
  Administered 2023-07-12 – 2023-07-16 (×8): 10 mL

## 2023-06-23 NOTE — Progress Notes (Signed)
Initial Nutrition Assessment  INTERVENTION:   Monitor magnesium, potassium, and phosphorus for at least 3 days, MD to replete as needed, as pt is at risk for refeeding syndrome.  -TPN management per Pharmacy -Recommend 100 mg Thiamine daily x 5 days -Daily weights while on TPN  NUTRITION DIAGNOSIS:   Inadequate oral intake related to chronic illness as evidenced by per patient/family report.  GOAL:   Patient will meet greater than or equal to 90% of their needs  MONITOR:   Labs, Weight trends, I & O's (TPN)  REASON FOR ASSESSMENT:   Consult New TPN/TNA  ASSESSMENT:   57 yo female with PMH anemia, HTN, pre-DM who presented with abdominal pain.  She recently underwent EGD on 05/08/2023.  She was started on H. pylori treatment after procedure.  She completed treatment course approximately 2 weeks prior to admission.  She has also been on oral iron and having dark stools. She was admitted for blood transfusion and GI evaluation.  Patient in bathroom at time of visit, also pt's pastor waiting outside to visit patient. Noted plan was for NGT placement in IR today. Will attempt to speak with patient at later time.  Per chart review, pt has been having N/V and abdominal pain PTA, symptoms have been intermittent for about a year. Was being treated for H. pylori. Now found to have new pelvic mass, undergoing workup.  TPN to begin as pt unable to tolerate or absorb anything PO.  TPN starting at 40 ml/hr, providing 969 kcals and 48g protein.   Per weight records, pt has lost 8 lbs since December 2023, insignificant for time frame. Will continue to monitor weight trends.   Medications reviewed.  Labs reviewed: Low Na Low K Elevated Mg Low iron   NUTRITION - FOCUSED PHYSICAL EXAM:  Unable to complete a this time.  Diet Order:   Diet Order     None       EDUCATION NEEDS:   Not appropriate for education at this time  Skin:  Skin Assessment: Reviewed RN Assessment  Last  BM:  8/28  Height:   Ht Readings from Last 1 Encounters:  06/20/23 5\' 4"  (1.626 m)    Weight:   Wt Readings from Last 1 Encounters:  06/20/23 93.7 kg    BMI:  Body mass index is 35.46 kg/m.  Estimated Nutritional Needs:   Kcal:  1700-1900  Protein:  80-90g  Fluid:  1.9L/day  Tilda Franco, MS, RD, LDN Inpatient Clinical Dietitian Contact information available via Amion

## 2023-06-23 NOTE — Assessment & Plan Note (Addendum)
-   per CT there is 4.7 x 3 cm mass in RLL and mass superior to cervix 7.9 x 6.1 cm. Known thickened endometrium again seen and recent hx DUB that was being monitored outpatient.  - initial concern was for GYN malignancy; this has now been ruled out after ex-lap and pathology results peritoneal nodule needle core biopsy from 06/24/2023; endometrial biopsy also negative from 06/24/2023 -Patient underwent ex lap with distal small bowel resection with primary anastomosis, excisional biopsy of pelvic, multiple abdominal wall and omental masses on 06/30/2023 - general surgery following - oncology also now on board and following for further outpatient plans - PAC placed by IR on 07/08/23

## 2023-06-23 NOTE — Progress Notes (Signed)
Peripherally Inserted Central Catheter Placement  The IV Nurse has discussed with the patient and/or persons authorized to consent for the patient, the purpose of this procedure and the potential benefits and risks involved with this procedure.  The benefits include less needle sticks, lab draws from the catheter, and the patient may be discharged home with the catheter. Risks include, but not limited to, infection, bleeding, blood clot (thrombus formation), and puncture of an artery; nerve damage and irregular heartbeat and possibility to perform a PICC exchange if needed/ordered by physician.  Alternatives to this procedure were also discussed.  Bard Power PICC patient education guide, fact sheet on infection prevention and patient information card has been provided to patient /or left at bedside.    PICC Placement Documentation  PICC Double Lumen 06/23/23 Right Brachial 39 cm 2 cm (Active)  Indication for Insertion or Continuance of Line Administration of hyperosmolar/irritating solutions (i.e. TPN, Vancomycin, etc.) 06/23/23 1844  Exposed Catheter (cm) 2 cm 06/23/23 1844  Site Assessment Clean, Dry, Intact 06/23/23 1844  Lumen #1 Status Flushed;Saline locked;Blood return noted 06/23/23 1844  Lumen #2 Status Flushed;Saline locked;Blood return noted 06/23/23 1844  Dressing Type Transparent;Securing device 06/23/23 1844  Dressing Status Antimicrobial disc in place;Clean, Dry, Intact 06/23/23 1844  Line Care Connections checked and tightened 06/23/23 1844  Line Adjustment (NICU/IV Team Only) No 06/23/23 1844  Dressing Intervention New dressing;Adhesive placed at insertion site (IV team only);Other (Comment) 06/23/23 1844  Dressing Change Due 06/30/23 06/23/23 1844    Already have a bruised from old PIV on the lateral/cephalic side prior to PICC insertion.    Linda [name] 06/23/2023, 6:46 PM

## 2023-06-23 NOTE — Progress Notes (Signed)
   06/23/23 1448  TOC Brief Assessment  Insurance and Status Reviewed  Patient has primary care physician Yes  Home environment has been reviewed yes  Prior level of function: independent  Prior/Current Home Services No current home services  Social Determinants of Health Reivew SDOH reviewed no interventions necessary  Readmission risk has been reviewed Yes  Transition of care needs no transition of care needs at this time

## 2023-06-23 NOTE — Progress Notes (Signed)
GI PROGRESS NOTE (covering for Dr. Elnoria Howard):  Linda [name] is a 57 y.o. female admitted with abdominal pain, nausea, vomiting, and iron deficiency anemia.  She was not felt to have significant active bleeding.  Underwent ultrasound was unrevealing.  CT scan which revealed the following:  IMPRESSION (CT abdomen pelvis): 1. Distal small-bowel obstruction with transition point in the right lower quadrant, likely associated with a complex mass in the right lower quadrant. Patient may benefit from nasogastric tube decompression and general surgical consultation. 2. Multiple peritoneal nodules, highly suspicious for carcinomatosis. Dominant heterogeneous mass superior to the cervix is not clearly related to either ovary and could reflect a drop metastasis or atypical primary pelvic malignancy. Associated mildly thickened and heterogeneous endometrium. No definite other primary malignancy identified in the abdomen or pelvis. 3. Indeterminate hypodense lesion posteriorly in the right lobe of the liver, corresponding with the ultrasound finding, worrisome for metastatic disease based on the other findings. 4. Small right pleural effusion with dependent right lower lobe atelectasis. 5. Appropriate oncology referral recommended for additional diagnosis and staging. PET-CT may be helpful.  LABORATORIES: Hemoglobin 8.3 Potassium 3.4  She was seen by general surgery.  Concern for GYN malignancy.  Had colonoscopy June 2023.  Due to recurrent vomiting, attempted NG tube made was unsuccessful.  She is resting now after pain medication.  Her son is in the room.  We discussed the above findings  REVIEW OF SYSTEMS:  All non-GI ROS negative. Past Medical History:  Diagnosis Date   Anemia    Hypertension    Pre-diabetes 02/2022    Past Surgical History:  Procedure Laterality Date   BREAST LUMPECTOMY WITH RADIOACTIVE SEED LOCALIZATION Left 06/10/2022   Procedure: LEFT BREAST LUMPECTOMY WITH  RADIOACTIVE SEED LOCALIZATION;  Surgeon: Griselda Miner, MD;  Location: [facility];  Service: General;  Laterality: Left;   CESAREAN SECTION     x4   TUBAL LIGATION      Social History MAYURI SHENTON  reports that she has never smoked. She has never used smokeless tobacco. She reports that she does not drink alcohol and does not use drugs.  family history includes Diabetes in her maternal grandmother; Hypertension in her brother, father, maternal grandfather, and maternal grandmother; Memory loss in her mother.  No Known Allergies     PHYSICAL EXAMINATION: Vital signs: BP 123/63 (BP Location: Right Arm)   Pulse 74   Temp 98.9 F (37.2 C) (Oral)   Resp 18   Ht 5\' 4"  (1.626 m)   Wt 93.7 kg   SpO2 97%   BMI 35.46 kg/m  General: Well-developed, well-nourished, no acute distress.  Resting HEENT: Sclerae are anicteric. Lungs: Clear Heart: Regular Abdomen: soft, mild abdominal, nondistended, no obvious ascites, no peritoneal signs, normal bowel sounds. No organomegaly. Extremities: No edema Psychiatric: alert and oriented x3. Cooperative   ASSESSMENT:  1.  Recurrent problems with recurrent nausea vomiting abdominal pain.  Admitted with the same.  Problems secondary to likely abdominal malignancy small bowel obstruction, as defined on CT.  Suspect GYN origin. 2.  Colonoscopy June 2023 without significant abnormalities 3.  History of iron deficiency anemia 4.  EGD twice within the past year.  History of H. pylori.  Treated twice (second time biopsies did not show organisms).   PLAN:  1.  Plans per surgery 2.  Do not recommend colonoscopy or upper endoscopy, as these have been done recently. 3.  Dr. Elnoria Howard will resume GI care tomorrow.  Wilhemina Bonito.  Eda Keys., M.D. [facility] Division of Gastroenterology

## 2023-06-23 NOTE — Progress Notes (Signed)
PHARMACY - TOTAL PARENTERAL NUTRITION CONSULT NOTE   Indication: Small bowel obstruction  Patient Measurements: Height: 5\' 4"  (162.6 cm) Weight: 93.7 kg (206 lb 9.1 oz) IBW/kg (Calculated) : 54.7 TPN AdjBW (KG): 64.5 Body mass index is 35.46 kg/m. Usual Weight:   Assessment:  Pharmacy is consulted to start TPN on 57 yo female diagnosed with bowel obstruction.   Glucose / Insulin:  - no hx of DM  - BG on BMP have been at goal ( <150)  Electrolytes:  - Na is low at 132 - K+ 3.4  - Magnesium is elevated at 2.6  - others are WNL  Renal:  - Scr < 1 - BUN WNL  Hepatic:  - LFT and alk phos ( 8/30 )  Intake / Output; MIVF:  - not on maintenance fluids  GI Imaging: - 9/1 Distal small-bowel obstruction with transition point in the right lower quadrant, likely associated with a complex mass in the right lower quadrant. Patient may benefit from nasogastric tube decompression and general surgical consultation. Concern for malignancy GI Surgeries / Procedures:   Central access: plan for IV access of 9/2  TPN start date: 9/2  Nutritional Goals: Goal TPN rate is --- mL/hr (provides --- g of protein and --- kcals per day)  RD Assessment:    Current Nutrition:  NPO  Plan:  Now -  Kcl 10 meq IV x 4 ordered by MD   Start TPN at 40 mL/hr at 1800 Electrolytes in TPN:  Na 75 mEq/L,  K 65 mEq/L,  Ca 80mEq/L,  Mg 0 mEq/L Phos 7mmol/L.  Cl:Ac 1:1 Add standard MVI and trace elements to TPN Initiate Sensitive q6h SSI and adjust as needed  Not on IV fluids  BMP, magnesium, phosphorus with AM labs  Monitor TPN labs on Mon/Thurs,    Adalberto Cole, PharmD, BCPS 06/23/2023 11:47 AM

## 2023-06-23 NOTE — Progress Notes (Signed)
Patient had 1000 ml of  light brown emesis around 2000. Discuss with patient that we would need to place and ng tube which patient agreed. Several attempts were made to both nostrils with no success using both 16 fr and 14 fr.  I asked the patient if it was ok for another nurse to try. Patient agreed, but once we entered the room she refused.  Pt may need NG tube place by IR. Before trying to place ng tube patient was given Zofran for nausea and Dilaudid for pain.

## 2023-06-23 NOTE — Progress Notes (Addendum)
Subjective/Chief Complaint: NG tube placement was attempted but unsuccessful last night.  She did vomit about a liter around 8 PM.    Objective: Vital signs in last 24 hours: Temp:  [97.9 F (36.6 C)-98.9 F (37.2 C)] 98.9 F (37.2 C) (09/02 0535) Pulse Rate:  [68-75] 74 (09/02 0535) Resp:  [18] 18 (09/02 0535) BP: (123-159)/(63-76) 123/63 (09/02 0535) SpO2:  [97 %-99 %] 97 % (09/02 0535) Last BM Date : 06/18/23  Intake/Output from previous day: 09/01 0701 - 09/02 0700 In: 220 [P.O.:220] Out: 1000 [Emesis/NG output:1000] Intake/Output this shift: No intake/output data recorded.  Alert, no distress Unlabored respirations Abdomen is distended, slightly softer than yesterday, no peritoneal signs  Lab Results:  Recent Labs    06/22/23 0619 06/23/23 0537  WBC 8.4 12.2*  HGB 8.1* 8.3*  HCT 28.0* 29.8*  PLT 501* 534*   BMET Recent Labs    06/22/23 0619 06/23/23 0537  NA 136 132*  K 3.7 3.4*  CL 102 99  CO2 25 23  GLUCOSE 114* 86  BUN 11 12  CREATININE 0.89 0.91  CALCIUM 8.9 9.0   PT/INR Recent Labs    06/20/23 1436  LABPROT 14.4  INR 1.1   ABG No results for input(s): "PHART", "HCO3" in the last 72 hours.  Invalid input(s): "PCO2", "PO2"  Studies/Results: CT ABDOMEN PELVIS W CONTRAST  Result Date: 06/22/2023 CLINICAL DATA:  Epigastric pain. Indeterminate right liver lesion on ultrasound. * Tracking Code: BO * EXAM: CT ABDOMEN AND PELVIS WITH CONTRAST TECHNIQUE: Multidetector CT imaging of the abdomen and pelvis was performed using the standard protocol following bolus administration of intravenous contrast. RADIATION DOSE REDUCTION: This exam was performed according to the departmental dose-optimization program which includes automated exposure control, adjustment of the mA and/or kV according to patient size and/or use of iterative reconstruction technique. CONTRAST:  OMNIPAQUE IOHEXOL 300 MG/ML  SOLN COMPARISON:  Abdominal ultrasound 06/21/2023  and 10/29/2022. FINDINGS: Lower chest: Small right pleural effusion with dependent right lower lobe atelectasis. The left lung base is clear. Hepatobiliary: The liver is normal in density without definite signs of steatosis or morphologic changes of cirrhosis. There is an indeterminate hypodense lesion posteriorly in the right lobe measuring 1.3 x 1.3 cm on image 26/2, corresponding with the ultrasound finding. Although nonspecific by this examination, this is worrisome for metastatic disease based on additional findings described below. Possible additional tiny lesion more inferiorly in the right lobe on image 34/2. No evidence of gallstones, gallbladder wall thickening or biliary dilatation. Pancreas: Unremarkable. No pancreatic ductal dilatation or surrounding inflammatory changes. Spleen: Normal in size without focal abnormality. Adrenals/Urinary Tract: Both adrenal glands appear normal. No evidence of urinary tract calculus, suspicious renal lesion or hydronephrosis. Mild renal cortical scarring on the right. The bladder appears unremarkable for its degree of distention. Stomach/Bowel: Enteric contrast was administered and has passed into the mid small bowel. The stomach and proximal small bowel appear normal. There are multiple moderately dilated loops of mid to distal small bowel with air-levels consistent with a distal small-bowel obstruction. Transition point is in the right lower quadrant and likely associated with a complex mass measuring approximately 4.7 x 3.0 cm on image 79/2. The distal small bowel is decompressed. The appendix is not well visualized, although demonstrates no definite abnormality. The colon is decompressed without wall thickening or focal abnormality. There are mild diverticular changes throughout the descending and sigmoid colon. Vascular/Lymphatic: 2.9 x 2.2 cm mesenteric nodule on image 50/2 may reflect an  enlarged lymph node. No other enlarged abdominal or pelvic lymph nodes. No  significant vascular findings. No evidence of aneurysm or large vessel occlusion. Reproductive: The endometrium appears mildly thickened and heterogeneous, measuring up to 1.5 cm in thickness on sagittal image 66/6. No definite myometrial abnormality. There is a large heterogeneous mass superior to the cervix which measures 7.9 x 6.1 cm on image 81/2. This is not clearly related to either ovary. The right lower quadrant mass described above causing apparent distal small bowel obstruction may be related to the right ovary. No left ovarian mass identified. Other: No evidence of abdominal wall hernia, generalized ascites or pneumoperitoneum. There are multiple peritoneal nodules, highly suspicious for carcinomatosis. Representative lesions include a 2.1 cm lesion deep to the umbilicus on image 70/2, a 3.1 x 2.4 cm lesion in the right lower quadrant on image 71/2 and anterior left upper quadrant lesion measuring 2.0 x 1.4 cm lesion on image 53/2. Enhancing nodule measuring 2.8 cm in the left mid abdomen is associated with an adjacent cystic structure measuring up to 8.5 x 7.3 cm on image 64/2, possibly an area of loculated ascites. Additional pelvic masses are described above. Musculoskeletal: No acute or significant osseous findings. Mild degenerative changes of both hips. Unless specific follow-up recommendations are mentioned in the findings or impression sections, no imaging follow-up of any mentioned incidental findings is recommended. IMPRESSION: 1. Distal small-bowel obstruction with transition point in the right lower quadrant, likely associated with a complex mass in the right lower quadrant. Patient may benefit from nasogastric tube decompression and general surgical consultation. 2. Multiple peritoneal nodules, highly suspicious for carcinomatosis. Dominant heterogeneous mass superior to the cervix is not clearly related to either ovary and could reflect a drop metastasis or atypical primary pelvic  malignancy. Associated mildly thickened and heterogeneous endometrium. No definite other primary malignancy identified in the abdomen or pelvis. 3. Indeterminate hypodense lesion posteriorly in the right lobe of the liver, corresponding with the ultrasound finding, worrisome for metastatic disease based on the other findings. 4. Small right pleural effusion with dependent right lower lobe atelectasis. 5. Appropriate oncology referral recommended for additional diagnosis and staging. PET-CT may be helpful. Electronically Signed   By: Carey Bullocks M.D.   On: 06/22/2023 15:02   US ABDOMEN LIMITED RUQ (LIVER/GB)  Result Date: 06/21/2023 CLINICAL DATA:  Epigastric pain for 3 days EXAM: ULTRASOUND ABDOMEN LIMITED RIGHT UPPER QUADRANT COMPARISON:  10/29/2022 FINDINGS: Gallbladder: No gallstones or wall thickening visualized. No sonographic Murphy sign noted by sonographer. Common bile duct: Diameter: 3 mm Liver: Diffuse increased liver echotexture compatible with hepatic steatosis. There is a focal area of fatty sparing near the gallbladder fossa. Within the right lobe liver there is an indistinct 2.5 x 2.3 x 1.8 cm hypoechoic region, nonspecific. No intrahepatic duct dilation. Portal vein is patent on color Doppler imaging with normal direction of blood flow towards the liver. Other: Small right pleural effusion incidentally noted. IMPRESSION: 1. Hepatic steatosis, with focal fatty sparing near the gallbladder fossa. 2. Indeterminate 2.5 cm hypoechoic region within the right lobe liver, not seen on prior imaging. When the patient is clinically stable and able to follow directions and hold their breath (preferably as an outpatient) further evaluation with dedicated abdominal MRI should be considered. 3. Right pleural effusion. Electronically Signed   By: Sharlet Salina M.D.   On: 06/21/2023 16:40    Anti-infectives: Anti-infectives (From admission, onward)    None       Assessment/Plan:  57 year old  woman with progressive obstruction related to a newly identified pelvic mass.  Suspect gynecologic origin given negative colonoscopy last year; will defer to GI team whether repeat colonoscopic evaluation is warranted.     I recommend NG tube in fluoroscopy if patient is agreeable today.  I think this will really help improve her comfort level while ongoing workup is underway.  Would also recommend converting all meds possible to IV, as I do not think she is absorbing anything enterally right now.   Tumor markers pending, would also recommend considering gyn-onc consultation to guide further imaging/workup if indicated.    General surgery team will follow.   LOS: 3 days    Berna Bue 06/23/2023

## 2023-06-23 NOTE — Progress Notes (Signed)
Progress Note    Linda [name]   ZOX:[id number]  DOB: [birth year]  DOA: 06/20/2023     3 PCP: Arnette Felts, [name]  Initial CC: dark stools, abdominal pain  Hospital Course: Linda [name] is a 57 yo female with PMH anemia, HTN, pre-DM who presented with abdominal pain.  She recently underwent EGD on 05/08/2023.  She was started on H. pylori treatment after procedure.  She completed treatment course approximately 2 weeks prior to admission. She has also been on oral iron and having dark stools. Due to worsening weakness and ongoing abdominal discomfort, she presented to the ER for further evaluation. Hemoglobin was found to be 5.9 g/dL.  She was admitted for blood transfusion and GI evaluation. She had recent colonoscopy and upper endoscopy performed June 2023.  She was not recommended for repeat scopes.  RUQ ultrasound showed hepatic steatosis and hypoechoic region in the right lobe liver.  Due to abdominal pain on admission, further workup commenced with CT abdomen/pelvis in case of gallbladder pathology. CT was notable for multiple findings.  There was a large mass measuring 4.7 x 3 cm noted in the right lower quadrant and also large heterogenous mass superior to the cervix measuring 7.9 x 6.1 cm. Multiple dilated loops of bowel concerning for compression/obstruction were also appreciated. She also has a recent history over the past year of dysfunctional uterine bleeding and thickened endometrium noted on outpatient ultrasound and again seen on CT.  She had declined outpatient endometrial biopsy.  Due to these findings, further workup commenced with consulting surgery, oncology, and gyn-oncology. Tumor markers ordered as well.   Interval History:  She vomited a large amount overnight. NGT attempt unsuccessful x 2.  She's aware of CT findings and now further workup for possible GYN malignancy.  Starting TPN and seeing if IR able to biopsy to tissue diagnosis and awaiting further input from  specialists.   Assessment and Plan: * Upper GI bleed - Possibly slow/chronic blood loss from underlying "chronic inactive gastritis" noted on EGD from 05/08/2023.  No H. pylori organisms were noted on immunohistochemical stain but due to pattern of inflammation, she was started on H. pylori treatment which she completed approximately 2 weeks prior to admission -Stools have remained melanotic in appearance but she states she has also been on oral iron daily -Hemoglobin certainly low on admission, 5.9 g/dL.  Received 1 unit PRBC. Repeat Hgb 7g/dL therefore given 1 more unit (2 total for admission so far) - continue PPI - GI following  Abdominal mass - per CT there is 4.7 x 3 cm mass in RLL and mass superior to cervix 7.9 x 6.1 cm. Known thickened endometrium again seen and recent hx DUB that was being monitored outpatient. In context of this alone, concern is GYN malignancy. 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  Other small scattered lesions also appreciated on imaging -Tumor markers ordered for further workup.  CEA, CA 19-9, CA125 -Oncology and GYN oncology have been consulted - IR consulted to see if able to biopsy any of these multiple secondary lesions  IDA (iron deficiency anemia) - iron labs: Iron 9, sat ratio 2%, TIBC 444. Ferritin 8 - s/p 2 units PRBC so far - also received 2 days of Ferrlecit, but discontinued further doses in setting of suspected malignancy - needs repeat iron studies in 3-4 months  SBO (small bowel obstruction) (HCC) - Presented with distended abdomen and decreased bowel sounds.  Initially suspected constipation contributing but after CT,  concern is now for underlying obstruction which may be due to mass effect - general surgery following as well - she had not been too symptomatic until night of 9/1 when she vomited almost 1 L fluid - now NPO; unable to place NGT by staff due to resistance; IR consulted to help with placement -  poor intake even before admission not likely to use gut anytime soon so in need of PICC and starting TPN  Type 2 diabetes mellitus with obesity (HCC) - Last A1c 6.5% on 12/05/2022 - Insetting of starting TPN, may need sliding scale.  Await PICC line placement and TPN initiation first  Essential hypertension - hold ARB in setting of above - PRN meds to be added; can make scheduled if needed   Old records reviewed in assessment of this patient  Antimicrobials:   DVT prophylaxis:  SCDs Start: 06/20/23 2059   Code Status:   Code Status: Full Code  Mobility Assessment (Last 72 Hours)     Mobility Assessment     Row Name 06/22/23 2100 06/22/23 2031 06/22/23 0824 06/21/23 21:10:36 06/21/23 0800   Does patient have an order for bedrest or is patient medically unstable No - Continue assessment No - Continue assessment No - Continue assessment No - Continue assessment No - Continue assessment   What is the highest level of mobility based on the progressive mobility assessment? Level 6 (Walks independently in room and hall) - Balance while walking in room without assist - Complete Level 6 (Walks independently in room and hall) - Balance while walking in room without assist - Complete Level 6 (Walks independently in room and hall) - Balance while walking in room without assist - Complete Level 6 (Walks independently in room and hall) - Balance while walking in room without assist - Complete Level 6 (Walks independently in room and hall) - Balance while walking in room without assist - Complete    Row Name 06/20/23 2050           Does patient have an order for bedrest or is patient medically unstable No - Continue assessment       What is the highest level of mobility based on the progressive mobility assessment? Level 6 (Walks independently in room and hall) - Balance while walking in room without assist - Complete                Barriers to discharge: None Disposition Plan:  Home Status is: Inpatient  Objective: Blood pressure (!) 109/54, pulse 66, temperature 98.9 F (37.2 C), temperature source Oral, resp. rate 16, height 5\' 4"  (1.626 m), weight 93.7 kg, SpO2 100%.  Examination:  Physical Exam Constitutional:      Appearance: Normal appearance.  HENT:     Head: Normocephalic and atraumatic.     Mouth/Throat:     Mouth: Mucous membranes are moist.  Eyes:     Extraocular Movements: Extraocular movements intact.  Cardiovascular:     Rate and Rhythm: Regular rhythm.  Pulmonary:     Effort: Pulmonary effort is normal. No respiratory distress.     Breath sounds: Normal breath sounds. No wheezing.  Abdominal:     General: Bowel sounds are decreased. There is distension.     Palpations: Abdomen is soft.     Tenderness: There is no abdominal tenderness.  Musculoskeletal:        General: Normal range of motion.     Cervical back: Normal range of motion and neck supple.  Skin:  General: Skin is warm and dry.  Neurological:     General: No focal deficit present.     Mental Status: She is alert.  Psychiatric:        Mood and Affect: Mood normal.      Consultants:  GI General surgery Gyn-onc Oncology IR  Procedures:    Data Reviewed: Results for orders placed or performed during the hospital encounter of 06/20/23 (from the past 24 hour(s))  Basic metabolic panel     Status: Abnormal   Collection Time: 06/23/23  5:37 AM  Result Value Ref Range   Sodium 132 (L) 135 - 145 mmol/L   Potassium 3.4 (L) 3.5 - 5.1 mmol/L   Chloride 99 98 - 111 mmol/L   CO2 23 22 - 32 mmol/L   Glucose, Bld 86 70 - 99 mg/dL   BUN 12 6 - 20 mg/dL   Creatinine, Ser 4.54 0.44 - 1.00 mg/dL   Calcium 9.0 8.9 - 09.8 mg/dL   GFR, Estimated >11 >91 mL/min   Anion gap 10 5 - 15  CBC with Differential/Platelet     Status: Abnormal   Collection Time: 06/23/23  5:37 AM  Result Value Ref Range   WBC 12.2 (H) 4.0 - 10.5 K/uL   RBC 3.92 3.87 - 5.11 MIL/uL   Hemoglobin 8.3  (L) 12.0 - 15.0 g/dL   HCT 47.8 (L) 29.5 - 62.1 %   MCV 76.0 (L) 80.0 - 100.0 fL   MCH 21.2 (L) 26.0 - 34.0 pg   MCHC 27.9 (L) 30.0 - 36.0 g/dL   RDW 30.8 (H) 65.7 - 84.6 %   Platelets 534 (H) 150 - 400 K/uL   nRBC 1.2 (H) 0.0 - 0.2 %   Neutrophils Relative % 77 %   Neutro Abs 9.4 (H) 1.7 - 7.7 K/uL   Lymphocytes Relative 12 %   Lymphs Abs 1.4 0.7 - 4.0 K/uL   Monocytes Relative 10 %   Monocytes Absolute 1.2 (H) 0.1 - 1.0 K/uL   Eosinophils Relative 0 %   Eosinophils Absolute 0.0 0.0 - 0.5 K/uL   Basophils Relative 0 %   Basophils Absolute 0.0 0.0 - 0.1 K/uL   Immature Granulocytes 1 %   Abs Immature Granulocytes 0.09 (H) 0.00 - 0.07 K/uL  Magnesium     Status: Abnormal   Collection Time: 06/23/23  5:37 AM  Result Value Ref Range   Magnesium 2.6 (H) 1.7 - 2.4 mg/dL    I have reviewed pertinent nursing notes, vitals, labs, and images as necessary. I have ordered labwork to follow up on as indicated.  I have reviewed the last notes from staff over past 24 hours. I have discussed patient's care plan and test results with nursing staff, CM/SW, and other staff as appropriate.  Time spent: Greater than 50% of the 55 minute visit was spent in counseling/coordination of care for the patient as laid out in the A&P.   LOS: 3 days   Lewie Chamber, MD [facility] 06/23/2023, 4:39 PM

## 2023-06-24 ENCOUNTER — Inpatient Hospital Stay (HOSPITAL_COMMUNITY): Payer: BC Managed Care – PPO

## 2023-06-24 ENCOUNTER — Other Ambulatory Visit: Payer: Self-pay

## 2023-06-24 DIAGNOSIS — N939 Abnormal uterine and vaginal bleeding, unspecified: Secondary | ICD-10-CM

## 2023-06-24 DIAGNOSIS — C786 Secondary malignant neoplasm of retroperitoneum and peritoneum: Secondary | ICD-10-CM | POA: Diagnosis not present

## 2023-06-24 DIAGNOSIS — R1900 Intra-abdominal and pelvic swelling, mass and lump, unspecified site: Secondary | ICD-10-CM | POA: Diagnosis not present

## 2023-06-24 DIAGNOSIS — D509 Iron deficiency anemia, unspecified: Secondary | ICD-10-CM | POA: Diagnosis not present

## 2023-06-24 DIAGNOSIS — R9389 Abnormal findings on diagnostic imaging of other specified body structures: Secondary | ICD-10-CM | POA: Diagnosis not present

## 2023-06-24 DIAGNOSIS — K922 Gastrointestinal hemorrhage, unspecified: Secondary | ICD-10-CM | POA: Diagnosis not present

## 2023-06-24 DIAGNOSIS — K56609 Unspecified intestinal obstruction, unspecified as to partial versus complete obstruction: Secondary | ICD-10-CM | POA: Diagnosis not present

## 2023-06-24 LAB — GLUCOSE, CAPILLARY
Glucose-Capillary: 102 mg/dL — ABNORMAL HIGH (ref 70–99)
Glucose-Capillary: 143 mg/dL — ABNORMAL HIGH (ref 70–99)
Glucose-Capillary: 153 mg/dL — ABNORMAL HIGH (ref 70–99)
Glucose-Capillary: 130 mg/dL — ABNORMAL HIGH (ref 70–99)

## 2023-06-24 LAB — CBC WITH DIFFERENTIAL/PLATELET
Abs Immature Granulocytes: 0.07 10*3/uL (ref 0.00–0.07)
Basophils Absolute: 0 10*3/uL (ref 0.0–0.1)
Basophils Relative: 0 %
Eosinophils Absolute: 0.1 10*3/uL (ref 0.0–0.5)
Eosinophils Relative: 1 %
HCT: 30.4 % — ABNORMAL LOW (ref 36.0–46.0)
Hemoglobin: 8.7 g/dL — ABNORMAL LOW (ref 12.0–15.0)
Immature Granulocytes: 1 %
Lymphocytes Relative: 10 %
Lymphs Abs: 1.2 10*3/uL (ref 0.7–4.0)
MCH: 22.1 pg — ABNORMAL LOW (ref 26.0–34.0)
MCHC: 28.6 g/dL — ABNORMAL LOW (ref 30.0–36.0)
MCV: 77.4 fL — ABNORMAL LOW (ref 80.0–100.0)
Monocytes Absolute: 1.2 10*3/uL — ABNORMAL HIGH (ref 0.1–1.0)
Monocytes Relative: 9 %
Neutro Abs: 10.1 10*3/uL — ABNORMAL HIGH (ref 1.7–7.7)
Neutrophils Relative %: 79 %
Platelets: 556 10*3/uL — ABNORMAL HIGH (ref 150–400)
RBC: 3.93 MIL/uL (ref 3.87–5.11)
RDW: 24.8 % — ABNORMAL HIGH (ref 11.5–15.5)
WBC: 12.6 10*3/uL — ABNORMAL HIGH (ref 4.0–10.5)
nRBC: 0.6 % — ABNORMAL HIGH (ref 0.0–0.2)

## 2023-06-24 LAB — BASIC METABOLIC PANEL
Anion gap: 11 (ref 5–15)
BUN: 29 mg/dL — ABNORMAL HIGH (ref 6–20)
CO2: 21 mmol/L — ABNORMAL LOW (ref 22–32)
Calcium: 9.2 mg/dL (ref 8.9–10.3)
Chloride: 101 mmol/L (ref 98–111)
Creatinine, Ser: 1.64 mg/dL — ABNORMAL HIGH (ref 0.44–1.00)
GFR, Estimated: 37 mL/min — ABNORMAL LOW (ref >60)
Glucose, Bld: 131 mg/dL — ABNORMAL HIGH (ref 70–99)
Potassium: 3.6 mmol/L (ref 3.5–5.1)
Sodium: 133 mmol/L — ABNORMAL LOW (ref 135–145)

## 2023-06-24 LAB — PROTIME-INR
INR: 1.2 (ref 0.8–1.2)
Prothrombin Time: 15.6 s — ABNORMAL HIGH (ref 11.4–15.2)

## 2023-06-24 LAB — MAGNESIUM: Magnesium: 2.9 mg/dL — ABNORMAL HIGH (ref 1.7–2.4)

## 2023-06-24 LAB — PHOSPHORUS: Phosphorus: 6.1 mg/dL — ABNORMAL HIGH (ref 2.5–4.6)

## 2023-06-24 MED ORDER — THIAMINE HCL 100 MG/ML IJ SOLN
100.0000 mg | Freq: Every day | INTRAMUSCULAR | Status: AC
  Administered 2023-06-24 – 2023-06-28 (×5): 100 mg via INTRAVENOUS
  Filled 2023-06-24 (×5): qty 2

## 2023-06-24 MED ORDER — MIDAZOLAM HCL 2 MG/2ML IJ SOLN
INTRAMUSCULAR | Status: AC | PRN
Start: 2023-06-24 — End: 2023-06-24
  Administered 2023-06-24: 1 mg via INTRAVENOUS

## 2023-06-24 MED ORDER — FENTANYL CITRATE (PF) 100 MCG/2ML IJ SOLN
INTRAMUSCULAR | Status: AC
  Filled 2023-06-24: qty 2

## 2023-06-24 MED ORDER — LIDOCAINE VISCOUS HCL 2 % MT SOLN
15.0000 mL | Freq: Once | OROMUCOSAL | Status: AC
  Administered 2023-06-24: 15 mL via OROMUCOSAL

## 2023-06-24 MED ORDER — TRAVASOL 10 % IV SOLN
INTRAVENOUS | Status: AC
  Filled 2023-06-24: qty 676.8

## 2023-06-24 MED ORDER — FENTANYL CITRATE (PF) 100 MCG/2ML IJ SOLN
INTRAMUSCULAR | Status: AC | PRN
Start: 2023-06-24 — End: 2023-06-24
  Administered 2023-06-24: 50 ug via INTRAVENOUS

## 2023-06-24 MED ORDER — NALOXONE HCL 0.4 MG/ML IJ SOLN
INTRAMUSCULAR | Status: AC
  Filled 2023-06-24: qty 1

## 2023-06-24 MED ORDER — SODIUM CHLORIDE 0.9 % IV SOLN: INTRAVENOUS | Status: AC

## 2023-06-24 MED ORDER — BUTAMBEN-TETRACAINE-BENZOCAINE 2-2-14 % EX AERO
2.0000 | INHALATION_SPRAY | Freq: Once | CUTANEOUS | Status: AC
  Administered 2023-06-24: 2 via TOPICAL
  Filled 2023-06-24: qty 20

## 2023-06-24 MED ORDER — FLUMAZENIL 0.5 MG/5ML IV SOLN
INTRAVENOUS | Status: AC
  Filled 2023-06-24: qty 5

## 2023-06-24 MED ORDER — MIDAZOLAM HCL 2 MG/2ML IJ SOLN
INTRAMUSCULAR | Status: AC
  Filled 2023-06-24: qty 2

## 2023-06-24 NOTE — Consult Note (Signed)
Chief Complaint: Patient was seen in consultation today for peritoneal nodule.  Referring Physician(s): Herma Carson, MD  Supervising Physician: Richarda Overlie  Patient Status: [facility] - In-pt  History of Present Illness: Linda [name] is a 57 y.o. female with a past medical history significant for anemia, HTN who presented to the ED 06/20/23 with complaints of severe epigastric pain. She was found to have severe anemia (hgb 5.9) and hemoccult positive stool. She was transfused and GI was consulted. She underwent RUQ [place] 06/21/23 which showed:  1. Hepatic steatosis, with focal fatty sparing near the gallbladder fossa. 2. Indeterminate 2.5 cm hypoechoic region within the right lobe liver, not seen on prior imaging. When the patient is clinically stable and able to follow directions and hold their breath (preferably as an outpatient) further evaluation with dedicated abdominal MRI should be considered. 3. Right pleural effusion.   Follow up CT abd/pelvis w/contrast 9/1 showed:  1. Distal small-bowel obstruction with transition point in the right lower quadrant, likely associated with a complex mass in the right lower quadrant. Patient may benefit from nasogastric tube decompression and general surgical consultation. 2. Multiple peritoneal nodules, highly suspicious for carcinomatosis. Dominant heterogeneous mass superior to the cervix is not clearly related to either ovary and could reflect a drop metastasis or atypical primary pelvic malignancy. Associated mildly thickened and heterogeneous endometrium. No definite other primary malignancy identified in the abdomen or pelvis. 3. Indeterminate hypodense lesion posteriorly in the right lobe of the liver, corresponding with the ultrasound finding, worrisome for metastatic disease based on the other findings. 4. Small right pleural effusion with dependent right lower lobe atelectasis. 5. Appropriate oncology referral recommended for  additional diagnosis and staging. PET-CT may be helpful.  General surgery was consulted and recommendation made for NG placement for decompression, however this was unable to be placed at bedside and as such patient is planned for fluoroscopically guided NG placement in radiology today. GYN/onc has also been consulted as well. IR has been consulted for possible biopsy to further direct care.  Past Medical History:  Diagnosis Date   Anemia    Hypertension    Pre-diabetes 02/2022    Past Surgical History:  Procedure Laterality Date   BREAST LUMPECTOMY WITH RADIOACTIVE SEED LOCALIZATION Left 06/10/2022   Procedure: LEFT BREAST LUMPECTOMY WITH RADIOACTIVE SEED LOCALIZATION;  Surgeon: Griselda Miner, MD;  Location: [facility];  Service: General;  Laterality: Left;   CESAREAN SECTION     x4   TUBAL LIGATION      Allergies: Patient has no known allergies.  Medications: Prior to Admission medications   Medication Sig Start Date End Date Taking? Authorizing Provider  Ferric Maltol (ACCRUFER) 30 MG CAPS Take 1 capsule (30 mg total) by mouth daily. 03/05/23  Yes Arnette Felts, [name]  linaclotide [facility]) 290 MCG CAPS capsule Take 1 capsule (290 mcg total) by mouth 20 minutes before breakfast 05/09/23  Yes   Semaglutide (RYBELSUS) 7 MG TABS Take 1 tablet (7 mg total) by mouth daily. 01/13/23  Yes Arnette Felts, [name]  valsartan (DIOVAN) 160 MG tablet Take 1 tablet (160 mg total) by mouth daily. 10/03/22 10/03/23 Yes Arnette Felts, [name]  amoxicillin (AMOXIL) 500 MG capsule Take 2 capsules (1,000 mg total) by mouth 2 (two) times daily for 14 days Patient not taking: Reported on 06/20/2023 05/19/23     clarithromycin (BIAXIN) 500 MG tablet Take 1 tablet (500 mg total) by mouth every 12 (twelve) hours for 14 days Patient not taking:  Reported on 06/20/2023 05/19/23     ferrous sulfate 325 (65 FE) MG tablet Take 1 tablet (325 mg total) by mouth daily. Patient not taking: Reported on 03/05/2023  02/22/22   Dione Booze, MD  fluticasone [facility]) 50 MCG/ACT nasal spray Place 2 sprays into both nostrils daily. Patient not taking: Reported on 03/05/2023 07/17/22   Henderly, Britni A, PA-C  lansoprazole (PREVACID) 30 MG capsule Take 1 capsule (30 mg total) by mouth 2 (two) times daily for 14 days Patient not taking: Reported on 06/20/2023 05/19/23     Vitamin D, Ergocalciferol, (DRISDOL) 1.25 MG (50000 UNIT) CAPS capsule Take 1 capsule (50,000 Units total) by mouth 2 (two) times a week. 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  Tobacco Use   Smoking status: Never   Smokeless tobacco: Never  Vaping Use   Vaping status: Never Used  Substance and Sexual Activity   Alcohol use: No   Drug use: No   Sexual activity: Not Currently    Birth control/protection: Surgical  Other Topics Concern   Not on file  Social History Narrative   Not on file   Social Determinants of Health   Financial Resource Strain: Not on file  Food Insecurity: No Food Insecurity (06/20/2023)   Hunger Vital Sign    Worried About Running Out of Food in the Last Year: Never true    Ran Out of Food in the Last Year: Never true  Transportation Needs: No Transportation Needs (06/20/2023)   PRAPARE - Administrator, Civil Service (Medical): No    Lack of Transportation (Non-Medical): No  Physical Activity:  Not on file  Stress: Not on file  Social Connections: Not on file     Review of Systems: A 12 point ROS discussed and pertinent positives are indicated in the HPI above.  All other systems are negative.  Review of Systems  Constitutional:  Negative for chills and fever.  Respiratory:  Negative for cough and shortness of breath.   Cardiovascular:  Negative for chest pain.  Gastrointestinal:  Positive for abdominal distention, abdominal pain and nausea. Negative for diarrhea and vomiting.  Musculoskeletal:  Negative for back pain.  Neurological:  Negative for dizziness and headaches.    Vital Signs: BP 125/71 (BP Location: Left Arm)   Pulse 65   Temp 98.3 F (36.8 C) (Oral)   Resp 17   Ht 5\' 4"  (1.626 m)   Wt 206 lb 9.1 oz (93.7 kg)   SpO2 97%   BMI 35.46 kg/m   Physical Exam Vitals and nursing note reviewed.  Constitutional:      General: She is not in acute distress. HENT:     Head: Normocephalic.     Mouth/Throat:     Mouth:  Mucous membranes are moist.     Pharynx: Oropharynx is clear. No oropharyngeal exudate or posterior oropharyngeal erythema.  Cardiovascular:     Rate and Rhythm: Normal rate and regular rhythm.  Pulmonary:     Effort: Pulmonary effort is normal.     Breath sounds: Normal breath sounds.  Abdominal:     General: There is distension.     Palpations: Abdomen is soft.  Skin:    General: Skin is warm and dry.  Neurological:     Mental Status: She is alert and oriented to person, place, and time.  Psychiatric:        Mood and Affect: Mood normal.        Behavior: Behavior normal.        Thought Content: Thought content normal.        Judgment: Judgment normal.      MD Evaluation Airway: WNL Heart: WNL Abdomen: WNL Chest/ Lungs: WNL ASA  Classification: 2 Mallampati/Airway Score: One   Imaging: [place] EKG SITE RITE  Result Date: 06/23/2023 If Site Rite image not attached, placement could not be confirmed due to current cardiac  rhythm.  [place] EKG SITE RITE  Result Date: 06/23/2023 If Site Rite image not attached, placement could not be confirmed due to current cardiac rhythm.  CT ABDOMEN PELVIS W CONTRAST  Result Date: 06/22/2023 CLINICAL DATA:  Epigastric pain. Indeterminate right liver lesion on ultrasound. * Tracking Code: BO * EXAM: CT ABDOMEN AND PELVIS WITH CONTRAST TECHNIQUE: Multidetector CT imaging of the abdomen and pelvis was performed using the standard protocol following bolus administration of intravenous contrast. RADIATION DOSE REDUCTION: This exam was performed according to the departmental dose-optimization program which includes automated exposure control, adjustment of the mA and/or kV according to patient size and/or use of iterative reconstruction technique. CONTRAST:  OMNIPAQUE IOHEXOL 300 MG/ML  SOLN COMPARISON:  Abdominal ultrasound 06/21/2023 and 10/29/2022. FINDINGS: Lower chest: Small right pleural effusion with dependent right lower lobe atelectasis. The left lung base is clear. Hepatobiliary: The liver is normal in density without definite signs of steatosis or morphologic changes of cirrhosis. There is an indeterminate hypodense lesion posteriorly in the right lobe measuring 1.3 x 1.3 cm on image 26/2, corresponding with the ultrasound finding. Although nonspecific by this examination, this is worrisome for metastatic disease based on additional findings described below. Possible additional tiny lesion more inferiorly in the right lobe on image 34/2. No evidence of gallstones, gallbladder wall thickening or biliary dilatation. Pancreas: Unremarkable. No pancreatic ductal dilatation or surrounding inflammatory changes. Spleen: Normal in size without focal abnormality. Adrenals/Urinary Tract: Both adrenal glands appear normal. No evidence of urinary tract calculus, suspicious renal lesion or hydronephrosis. Mild renal cortical scarring on the right. The bladder appears unremarkable for its degree of  distention. Stomach/Bowel: Enteric contrast was administered and has passed into the mid small bowel. The stomach and proximal small bowel appear normal. There are multiple moderately dilated loops of mid to distal small bowel with air-levels consistent with a distal small-bowel obstruction. Transition point is in the right lower quadrant and likely associated with a complex mass measuring approximately 4.7 x 3.0 cm on image 79/2. The distal small bowel is decompressed. The appendix is not well visualized, although demonstrates no definite abnormality. The colon is decompressed without wall thickening or focal abnormality. There are mild diverticular changes throughout the descending and sigmoid colon. Vascular/Lymphatic: 2.9 x 2.2 cm mesenteric nodule on image 50/2 may reflect an enlarged lymph node. No other enlarged  abdominal or pelvic lymph nodes. No significant vascular findings. No evidence of aneurysm or large vessel occlusion. Reproductive: The endometrium appears mildly thickened and heterogeneous, measuring up to 1.5 cm in thickness on sagittal image 66/6. No definite myometrial abnormality. There is a large heterogeneous mass superior to the cervix which measures 7.9 x 6.1 cm on image 81/2. This is not clearly related to either ovary. The right lower quadrant mass described above causing apparent distal small bowel obstruction may be related to the right ovary. No left ovarian mass identified. Other: No evidence of abdominal wall hernia, generalized ascites or pneumoperitoneum. There are multiple peritoneal nodules, highly suspicious for carcinomatosis. Representative lesions include a 2.1 cm lesion deep to the umbilicus on image 70/2, a 3.1 x 2.4 cm lesion in the right lower quadrant on image 71/2 and anterior left upper quadrant lesion measuring 2.0 x 1.4 cm lesion on image 53/2. Enhancing nodule measuring 2.8 cm in the left mid abdomen is associated with an adjacent cystic structure measuring up to 8.5  x 7.3 cm on image 64/2, possibly an area of loculated ascites. Additional pelvic masses are described above. Musculoskeletal: No acute or significant osseous findings. Mild degenerative changes of both hips. Unless specific follow-up recommendations are mentioned in the findings or impression sections, no imaging follow-up of any mentioned incidental findings is recommended. IMPRESSION: 1. Distal small-bowel obstruction with transition point in the right lower quadrant, likely associated with a complex mass in the right lower quadrant. Patient may benefit from nasogastric tube decompression and general surgical consultation. 2. Multiple peritoneal nodules, highly suspicious for carcinomatosis. Dominant heterogeneous mass superior to the cervix is not clearly related to either ovary and could reflect a drop metastasis or atypical primary pelvic malignancy. Associated mildly thickened and heterogeneous endometrium. No definite other primary malignancy identified in the abdomen or pelvis. 3. Indeterminate hypodense lesion posteriorly in the right lobe of the liver, corresponding with the ultrasound finding, worrisome for metastatic disease based on the other findings. 4. Small right pleural effusion with dependent right lower lobe atelectasis. 5. Appropriate oncology referral recommended for additional diagnosis and staging. PET-CT may be helpful. Electronically Signed   By: Carey Bullocks M.D.   On: 06/22/2023 15:02   US ABDOMEN LIMITED RUQ (LIVER/GB)  Result Date: 06/21/2023 CLINICAL DATA:  Epigastric pain for 3 days EXAM: ULTRASOUND ABDOMEN LIMITED RIGHT UPPER QUADRANT COMPARISON:  10/29/2022 FINDINGS: Gallbladder: No gallstones or wall thickening visualized. No sonographic Murphy sign noted by sonographer. Common bile duct: Diameter: 3 mm Liver: Diffuse increased liver echotexture compatible with hepatic steatosis. There is a focal area of fatty sparing near the gallbladder fossa. Within the right lobe liver  there is an indistinct 2.5 x 2.3 x 1.8 cm hypoechoic region, nonspecific. No intrahepatic duct dilation. Portal vein is patent on color Doppler imaging with normal direction of blood flow towards the liver. Other: Small right pleural effusion incidentally noted. IMPRESSION: 1. Hepatic steatosis, with focal fatty sparing near the gallbladder fossa. 2. Indeterminate 2.5 cm hypoechoic region within the right lobe liver, not seen on prior imaging. When the patient is clinically stable and able to follow directions and hold their breath (preferably as an outpatient) further evaluation with dedicated abdominal MRI should be considered. 3. Right pleural effusion. Electronically Signed   By: Sharlet Salina M.D.   On: 06/21/2023 16:40    Labs:  CBC: Recent Labs    06/21/23 0653 06/22/23 0619 06/23/23 0537 06/24/23 0304  WBC 8.2 8.4 12.2* 12.6*  HGB 7.0*  8.1* 8.3* 8.7*  HCT 24.4* 28.0* 29.8* 30.4*  PLT 525* 501* 534* 556*    COAGS: Recent Labs    06/20/23 1436 06/24/23 0942  INR 1.1 1.2    BMP: Recent Labs    06/21/23 0653 06/22/23 0619 06/23/23 0537 06/24/23 0304  NA 138 136 132* 133*  K 3.7 3.7 3.4* 3.6  CL 103 102 99 101  CO2 25 25 23  21*  GLUCOSE 129* 114* 86 131*  BUN 12 11 12  29*  CALCIUM 9.2 8.9 9.0 9.2  CREATININE 0.90 0.89 0.91 1.64*  GFRNONAA >60 >60 >60 37*    LIVER FUNCTION TESTS: Recent Labs    10/29/22 0749 06/20/23 1436  BILITOT 0.5 0.6  AST 16 13*  ALT 10 6  ALKPHOS 83 78  PROT 8.6* 8.7*  ALBUMIN 4.4 4.3    TUMOR MARKERS: No results for input(s): "AFPTM", "CEA", "CA199", "CHROMGRNA" in the last 8760 hours.  Assessment and Plan:  57 y/o F admitted 8/30 with severe epigastric pain found to have SBO requiring NG decompression as well as multiple peritoneal nodules, liver lesion and heterogeneous mass superior to the cervix concerning for possible atypical primary pelvic malignancy. IR has been consulted for possible biopsy to further direct care -  patient history and imaging reviewed by Dr. Lowella Dandy who approves patient for peritoneal nodule biopsy.   Plan: - Biopsy tentatively planned for today in [place] pending any emergent procedures - STAT INR - Keep NPO/do not start tube feeds until post procedure - IR will call for patient when ready  Risks and benefits of  was discussed with the patient and/or patient's family including, but not limited to bleeding, infection, damage to adjacent structures or low yield requiring additional tests.  All of the questions were answered and there is agreement to proceed.  Consent signed and in chart.  Thank you for this interesting consult.  I greatly enjoyed meeting Linda [name] and look forward to participating in their care.  A copy of this report was sent to the requesting provider on this date.  Electronically Signed: Villa Herb, PA-C 06/24/2023, 10:49 AM   I spent a total of 40 Minutes in face to face in clinical consultation, greater than 50% of which was counseling/coordinating care for peritoneal nodule.

## 2023-06-24 NOTE — Procedures (Signed)
Interventional Radiology Procedure:   Indications: Needs tissue diagnosis.  Scattered peritoneal disease is suggestive for neoplastic disease  Procedure: CT guided biopsy of peritoneal nodule  Findings: 3 core biopsies from anterior midline peritoneal nodule.    Complications: None     EBL: Minimal  Plan: Bedrest 2 hours  Khaylee Mcevoy R. Lowella Dandy, MD  Pager: [phone]

## 2023-06-24 NOTE — Progress Notes (Signed)
Progress Note     Subjective: Pt unable to get NGT placed yesterday but denies further nausea or vomiting. No flatus or BM. Still feels distended. Some lower abdominal discomfort and now reports some spotting vaginal bleeding as well. Husband at bedside.   Objective: Vital signs in last 24 hours: Temp:  [98.3 F (36.8 C)-98.9 F (37.2 C)] 98.3 F (36.8 C) (09/03 0535) Pulse Rate:  [65-75] 65 (09/03 0535) Resp:  [16-18] 17 (09/03 0535) BP: (104-125)/(54-71) 125/71 (09/03 0535) SpO2:  [97 %-100 %] 97 % (09/03 0535) Last BM Date : 06/18/23  Intake/Output from previous day: 09/02 0701 - 09/03 0700 In: 237 [P.O.:237] Out: -  Intake/Output this shift: No intake/output data recorded.  PE: General: pleasant, WD, WN female who is laying in bed in NAD Heart: regular, rate, and rhythm.  Lungs: Respiratory effort nonlabored Abd: soft, mild ttp in RLQ, moderately distended, BS hypoactive Psych: A&Ox3 with an appropriate affect.    Lab Results:  Recent Labs    06/23/23 0537 06/24/23 0304  WBC 12.2* 12.6*  HGB 8.3* 8.7*  HCT 29.8* 30.4*  PLT 534* 556*   BMET Recent Labs    06/23/23 0537 06/24/23 0304  NA 132* 133*  K 3.4* 3.6  CL 99 101  CO2 23 21*  GLUCOSE 86 131*  BUN 12 29*  CREATININE 0.91 1.64*  CALCIUM 9.0 9.2   PT/INR No results for input(s): "LABPROT", "INR" in the last 72 hours. CMP     Component Value Date/Time   NA 133 (L) 06/24/2023 0304   NA 141 05/16/2022 1044   K 3.6 06/24/2023 0304   CL 101 06/24/2023 0304   CO2 21 (L) 06/24/2023 0304   GLUCOSE 131 (H) 06/24/2023 0304   BUN 29 (H) 06/24/2023 0304   BUN 9 05/16/2022 1044   CREATININE 1.64 (H) 06/24/2023 0304   CALCIUM 9.2 06/24/2023 0304   PROT 8.7 (H) 06/20/2023 1436   PROT 7.8 05/16/2022 1044   ALBUMIN 4.3 06/20/2023 1436   ALBUMIN 4.4 05/16/2022 1044   AST 13 (L) 06/20/2023 1436   ALT 6 06/20/2023 1436   ALKPHOS 78 06/20/2023 1436   BILITOT 0.6 06/20/2023 1436   BILITOT <0.2  05/16/2022 1044   GFRNONAA 37 (L) 06/24/2023 0304   Lipase     Component Value Date/Time   LIPASE 35 06/20/2023 1436       Studies/Results: [place] EKG SITE RITE  Result Date: 06/23/2023 If Site Rite image not attached, placement could not be confirmed due to current cardiac rhythm.  [place] EKG SITE RITE  Result Date: 06/23/2023 If Site Rite image not attached, placement could not be confirmed due to current cardiac rhythm.  CT ABDOMEN PELVIS W CONTRAST  Result Date: 06/22/2023 CLINICAL DATA:  Epigastric pain. Indeterminate right liver lesion on ultrasound. * Tracking Code: BO * EXAM: CT ABDOMEN AND PELVIS WITH CONTRAST TECHNIQUE: Multidetector CT imaging of the abdomen and pelvis was performed using the standard protocol following bolus administration of intravenous contrast. RADIATION DOSE REDUCTION: This exam was performed according to the departmental dose-optimization program which includes automated exposure control, adjustment of the mA and/or kV according to patient size and/or use of iterative reconstruction technique. CONTRAST:  OMNIPAQUE IOHEXOL 300 MG/ML  SOLN COMPARISON:  Abdominal ultrasound 06/21/2023 and 10/29/2022. FINDINGS: Lower chest: Small right pleural effusion with dependent right lower lobe atelectasis. The left lung base is clear. Hepatobiliary: The liver is normal in density without definite signs of steatosis or morphologic changes of  cirrhosis. There is an indeterminate hypodense lesion posteriorly in the right lobe measuring 1.3 x 1.3 cm on image 26/2, corresponding with the ultrasound finding. Although nonspecific by this examination, this is worrisome for metastatic disease based on additional findings described below. Possible additional tiny lesion more inferiorly in the right lobe on image 34/2. No evidence of gallstones, gallbladder wall thickening or biliary dilatation. Pancreas: Unremarkable. No pancreatic ductal dilatation or surrounding inflammatory changes.  Spleen: Normal in size without focal abnormality. Adrenals/Urinary Tract: Both adrenal glands appear normal. No evidence of urinary tract calculus, suspicious renal lesion or hydronephrosis. Mild renal cortical scarring on the right. The bladder appears unremarkable for its degree of distention. Stomach/Bowel: Enteric contrast was administered and has passed into the mid small bowel. The stomach and proximal small bowel appear normal. There are multiple moderately dilated loops of mid to distal small bowel with air-levels consistent with a distal small-bowel obstruction. Transition point is in the right lower quadrant and likely associated with a complex mass measuring approximately 4.7 x 3.0 cm on image 79/2. The distal small bowel is decompressed. The appendix is not well visualized, although demonstrates no definite abnormality. The colon is decompressed without wall thickening or focal abnormality. There are mild diverticular changes throughout the descending and sigmoid colon. Vascular/Lymphatic: 2.9 x 2.2 cm mesenteric nodule on image 50/2 may reflect an enlarged lymph node. No other enlarged abdominal or pelvic lymph nodes. No significant vascular findings. No evidence of aneurysm or large vessel occlusion. Reproductive: The endometrium appears mildly thickened and heterogeneous, measuring up to 1.5 cm in thickness on sagittal image 66/6. No definite myometrial abnormality. There is a large heterogeneous mass superior to the cervix which measures 7.9 x 6.1 cm on image 81/2. This is not clearly related to either ovary. The right lower quadrant mass described above causing apparent distal small bowel obstruction may be related to the right ovary. No left ovarian mass identified. Other: No evidence of abdominal wall hernia, generalized ascites or pneumoperitoneum. There are multiple peritoneal nodules, highly suspicious for carcinomatosis. Representative lesions include a 2.1 cm lesion deep to the umbilicus on  image 70/2, a 3.1 x 2.4 cm lesion in the right lower quadrant on image 71/2 and anterior left upper quadrant lesion measuring 2.0 x 1.4 cm lesion on image 53/2. Enhancing nodule measuring 2.8 cm in the left mid abdomen is associated with an adjacent cystic structure measuring up to 8.5 x 7.3 cm on image 64/2, possibly an area of loculated ascites. Additional pelvic masses are described above. Musculoskeletal: No acute or significant osseous findings. Mild degenerative changes of both hips. Unless specific follow-up recommendations are mentioned in the findings or impression sections, no imaging follow-up of any mentioned incidental findings is recommended. IMPRESSION: 1. Distal small-bowel obstruction with transition point in the right lower quadrant, likely associated with a complex mass in the right lower quadrant. Patient may benefit from nasogastric tube decompression and general surgical consultation. 2. Multiple peritoneal nodules, highly suspicious for carcinomatosis. Dominant heterogeneous mass superior to the cervix is not clearly related to either ovary and could reflect a drop metastasis or atypical primary pelvic malignancy. Associated mildly thickened and heterogeneous endometrium. No definite other primary malignancy identified in the abdomen or pelvis. 3. Indeterminate hypodense lesion posteriorly in the right lobe of the liver, corresponding with the ultrasound finding, worrisome for metastatic disease based on the other findings. 4. Small right pleural effusion with dependent right lower lobe atelectasis. 5. Appropriate oncology referral recommended for additional diagnosis and  staging. PET-CT may be helpful. Electronically Signed   By: Carey Bullocks M.D.   On: 06/22/2023 15:02    Anti-infectives: Anti-infectives (From admission, onward)    None        Assessment/Plan  Newly identified pelvic mass - ?GYN etiology. Concern for carcinomatosis on imaging, GYN-ONC consult pending. Tumor  markers pending. IR also consulted for possible bx of liver lesion  SBO  - no hx of prior abdominal surgery  - NGT was not placed yesterday but no further n/v. Not having bowel function  - agree with fluoro guided NGT placement for symptom relief - TPN ordered by [facility] - no plans for emergent surgical intervention, await NGT placement and input from GYN-ONC and med ONC  UGI bleeding - GI following, PPI, s/p EGD 05/08/23. Hgb 8.7 this AM, stable  FEN: NPO VTE: chemical prophylaxis on hold in setting of GI bleed ID: no current abx  - per [facility] -  IDA T2DM HTN  LOS: 4 days   I reviewed Consultant GI notes, hospitalist notes, last 24 h vitals and pain scores, last 48 h intake and output, last 24 h labs and trends, and last 24 h imaging results.   Juliet Rude, [facility] 06/24/2023, 9:45 AM Please see Amion for pager number during day hours 7:00am-4:30pm

## 2023-06-24 NOTE — Progress Notes (Signed)
Progress Note    Linda [name]   XBJ:[id number]  DOB: 1966-06-03  DOA: 06/20/2023     4 PCP: Arnette Felts, [name]  Initial CC: dark stools, abdominal pain  Hospital Course: Linda [name] is a 57 yo female with PMH anemia, HTN, pre-DM who presented with abdominal pain.  She recently underwent EGD on 05/08/2023.  She was started on H. pylori treatment after procedure.  She completed treatment course approximately 2 weeks prior to admission. She has also been on oral iron and having dark stools. Due to worsening weakness and ongoing abdominal discomfort, she presented to the ER for further evaluation. Hemoglobin was found to be 5.9 g/dL.  She was admitted for blood transfusion and GI evaluation. She had recent colonoscopy and upper endoscopy performed June 2023.  She was not recommended for repeat scopes.  RUQ ultrasound showed hepatic steatosis and hypoechoic region in the right lobe liver.  Due to abdominal pain on admission, further workup commenced with CT abdomen/pelvis in case of gallbladder pathology. CT was notable for multiple findings.  There was a large mass measuring 4.7 x 3 cm noted in the right lower quadrant and also large heterogenous mass superior to the cervix measuring 7.9 x 6.1 cm. Multiple dilated loops of bowel concerning for compression/obstruction were also appreciated. She also has a recent history over the past year of dysfunctional uterine bleeding and thickened endometrium noted on outpatient ultrasound and again seen on CT.  She had declined outpatient endometrial biopsy.  Due to these findings, further workup commenced with consulting surgery, oncology, and gyn-oncology. Tumor markers ordered as well.   Interval History:  PICC line placed yesterday and TPN has been initiated. She did not tolerate IR procedure for placing NG tube.  Appears there was again some resistance and she became sick and vomited. Currently she is asymptomatic and not having much nausea so we  will leave tube out for now. She also plans for going back to radiology later today for biopsy.  Assessment and Plan: * Upper GI bleed - Possibly slow/chronic blood loss from underlying "chronic inactive gastritis" noted on EGD from 05/08/2023.  No H. pylori organisms were noted on immunohistochemical stain but due to pattern of inflammation, she was started on H. pylori treatment which she completed approximately 2 weeks prior to admission -Stools have remained melanotic in appearance but she states she has also been on oral iron daily -Hemoglobin certainly low on admission, 5.9 g/dL.  Received 1 unit PRBC. Repeat Hgb 7g/dL therefore given 1 more unit (2 total for admission so far) - continue PPI - GI following  Abdominal mass - per CT there is 4.7 x 3 cm mass in RLL and mass superior to cervix 7.9 x 6.1 cm. Known thickened endometrium again seen and recent hx DUB that was being monitored outpatient. In context of this alone, concern is GYN malignancy. 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  Other small scattered lesions also appreciated on imaging -Tumor markers ordered for further workup.  CEA, CA 19-9, CA125 -Oncology and GYN oncology have been consulted - IR consulted to see if able to biopsy any of these multiple secondary lesions  IDA (iron deficiency anemia) - iron labs: Iron 9, sat ratio 2%, TIBC 444. Ferritin 8 - s/p 2 units PRBC so far - also received 2 days of Ferrlecit, but discontinued further doses in setting of suspected malignancy - needs repeat iron studies in 3-4 months  SBO (small bowel obstruction) (HCC) -  Presented with distended abdomen and decreased bowel sounds.  Initially suspected constipation contributing but after CT, concern is now for underlying obstruction which may be due to mass effect - general surgery following as well - she had not been too symptomatic until night of 9/1 when she vomited almost 1 L fluid - now NPO;  unable to place NGT by staff due to resistance; IR consulted to help with placement; this was also attempted 9/3 and procedure aborted due to resistance and gagging/vomiting during procedure - leaving NGT out for now as she's comfortable but if develops vomiting again, may need to retry bedside  - poor intake even before admission not likely to use gut anytime soon so in need of PICC and starting TPN; PICC placed 9/2 and TPN has been started  Type 2 diabetes mellitus with obesity (HCC) - Last A1c 6.5% on 12/05/2022 - SSI ordered in setting of TPN use   Essential hypertension - hold ARB in setting of above - PRN meds to be added; can make scheduled if needed   Old records reviewed in assessment of this patient  Antimicrobials:   DVT prophylaxis:  SCDs Start: 06/20/23 2059   Code Status:   Code Status: Full Code  Mobility Assessment (Last 72 Hours)     Mobility Assessment     Row Name 06/24/23 0800 06/23/23 2250 06/23/23 0800 06/22/23 2100 06/22/23 2031   Does patient have an order for bedrest or is patient medically unstable No - Continue assessment No - Continue assessment No - Continue assessment No - Continue assessment No - Continue assessment   What is the highest level of mobility based on the progressive mobility assessment? Level 6 (Walks independently in room and hall) - Balance while walking in room without assist - Complete Level 6 (Walks independently in room and hall) - Balance while walking in room without assist - Complete Level 5 (Walks with assist in room/hall) - Balance while stepping forward/back and can walk in room with assist - Complete Level 6 (Walks independently in room and hall) - Balance while walking in room without assist - Complete Level 6 (Walks independently in room and hall) - Balance while walking in room without assist - Complete    Row Name 06/22/23 0824 06/21/23 21:10:36         Does patient have an order for bedrest or is patient medically  unstable No - Continue assessment No - Continue assessment      What is the highest level of mobility based on the progressive mobility assessment? Level 6 (Walks independently in room and hall) - Balance while walking in room without assist - Complete Level 6 (Walks independently in room and hall) - Balance while walking in room without assist - Complete               Barriers to discharge: None Disposition Plan: Home Status is: Inpatient  Objective: Blood pressure 114/63, pulse 74, temperature 97.6 F (36.4 C), temperature source Oral, resp. rate 17, height 5\' 4"  (1.626 m), weight 93.7 kg, SpO2 99%.  Examination:  Physical Exam Constitutional:      Appearance: Normal appearance.  HENT:     Head: Normocephalic and atraumatic.     Mouth/Throat:     Mouth: Mucous membranes are moist.  Eyes:     Extraocular Movements: Extraocular movements intact.  Cardiovascular:     Rate and Rhythm: Regular rhythm.  Pulmonary:     Effort: Pulmonary effort is normal. No respiratory distress.  Breath sounds: Normal breath sounds. No wheezing.  Abdominal:     General: Bowel sounds are decreased. There is distension.     Palpations: Abdomen is soft.     Tenderness: There is no abdominal tenderness.  Musculoskeletal:        General: Normal range of motion.     Cervical back: Normal range of motion and neck supple.  Skin:    General: Skin is warm and dry.  Neurological:     General: No focal deficit present.     Mental Status: She is alert.  Psychiatric:        Mood and Affect: Mood normal.      Consultants:  GI General surgery Gyn-onc Oncology IR  Procedures:    Data Reviewed: Results for orders placed or performed during the hospital encounter of 06/20/23 (from the past 24 hour(s))  Glucose, capillary     Status: Abnormal   Collection Time: 06/24/23  1:19 AM  Result Value Ref Range   Glucose-Capillary 102 (H) 70 - 99 mg/dL  Basic metabolic panel     Status: Abnormal    Collection Time: 06/24/23  3:04 AM  Result Value Ref Range   Sodium 133 (L) 135 - 145 mmol/L   Potassium 3.6 3.5 - 5.1 mmol/L   Chloride 101 98 - 111 mmol/L   CO2 21 (L) 22 - 32 mmol/L   Glucose, Bld 131 (H) 70 - 99 mg/dL   BUN 29 (H) 6 - 20 mg/dL   Creatinine, Ser 5.62 (H) 0.44 - 1.00 mg/dL   Calcium 9.2 8.9 - 13.0 mg/dL   GFR, Estimated 37 (L) >60 mL/min   Anion gap 11 5 - 15  CBC with Differential/Platelet     Status: Abnormal   Collection Time: 06/24/23  3:04 AM  Result Value Ref Range   WBC 12.6 (H) 4.0 - 10.5 K/uL   RBC 3.93 3.87 - 5.11 MIL/uL   Hemoglobin 8.7 (L) 12.0 - 15.0 g/dL   HCT 86.5 (L) 78.4 - 69.6 %   MCV 77.4 (L) 80.0 - 100.0 fL   MCH 22.1 (L) 26.0 - 34.0 pg   MCHC 28.6 (L) 30.0 - 36.0 g/dL   RDW 29.5 (H) 28.4 - 13.2 %   Platelets 556 (H) 150 - 400 K/uL   nRBC 0.6 (H) 0.0 - 0.2 %   Neutrophils Relative % 79 %   Neutro Abs 10.1 (H) 1.7 - 7.7 K/uL   Lymphocytes Relative 10 %   Lymphs Abs 1.2 0.7 - 4.0 K/uL   Monocytes Relative 9 %   Monocytes Absolute 1.2 (H) 0.1 - 1.0 K/uL   Eosinophils Relative 1 %   Eosinophils Absolute 0.1 0.0 - 0.5 K/uL   Basophils Relative 0 %   Basophils Absolute 0.0 0.0 - 0.1 K/uL   Immature Granulocytes 1 %   Abs Immature Granulocytes 0.07 0.00 - 0.07 K/uL   Ovalocytes PRESENT   Magnesium     Status: Abnormal   Collection Time: 06/24/23  3:04 AM  Result Value Ref Range   Magnesium 2.9 (H) 1.7 - 2.4 mg/dL  Phosphorus     Status: Abnormal   Collection Time: 06/24/23  3:04 AM  Result Value Ref Range   Phosphorus 6.1 (H) 2.5 - 4.6 mg/dL  Glucose, capillary     Status: Abnormal   Collection Time: 06/24/23  6:27 AM  Result Value Ref Range   Glucose-Capillary 143 (H) 70 - 99 mg/dL  Protime-INR     Status: Abnormal  Collection Time: 06/24/23  9:42 AM  Result Value Ref Range   Prothrombin Time 15.6 (H) 11.4 - 15.2 seconds   INR 1.2 0.8 - 1.2  Glucose, capillary     Status: Abnormal   Collection Time: 06/24/23 11:38 AM  Result  Value Ref Range   Glucose-Capillary 153 (H) 70 - 99 mg/dL   Comment 1 Notify RN    Comment 2 Document in Chart     I have reviewed pertinent nursing notes, vitals, labs, and images as necessary. I have ordered labwork to follow up on as indicated.  I have reviewed the last notes from staff over past 24 hours. I have discussed patient's care plan and test results with nursing staff, CM/SW, and other staff as appropriate.  Time spent: Greater than 50% of the 55 minute visit was spent in counseling/coordination of care for the patient as laid out in the A&P.   LOS: 4 days   Lewie Chamber, MD [facility] 06/24/2023, 2:44 PM

## 2023-06-24 NOTE — Consult Note (Signed)
Gynecologic Oncology Consultation  Linda [name] 57 y.o. female  CC:  Chief Complaint  Patient presents with   Abdominal Pain    HPI: Linda [name] is a 57 year old female who presented to the ER on 06/20/2023 with complaints of epigastric abdominal pain, vomiting, bloating. Around 1.5-2 weeks ago, she developed dizziness along with dyspnea on exertion. Prior to coming to the ER, she reported developing epigastric pain with multiple episodes of emesis. She reported her bowel movements as infrequent and dark brown to black in color. In the ER, she underwent an US abdomen which showed hepatic steatosis, right pleural effusion, and an indeterminate 2.5 cm hypoechoic region within the right liver lobe. A CT AP was performed on 06/22/2023 revealing:  1. Distal small-bowel obstruction with transition point in the right lower quadrant, likely associated with a complex mass in the right lower quadrant. Patient may benefit from nasogastric tube decompression and general surgical consultation. 2. Multiple peritoneal nodules, highly suspicious for carcinomatosis. Dominant heterogeneous mass superior to the cervix is not clearly related to either ovary and could reflect a drop metastasis or atypical primary pelvic malignancy. Associated mildly thickened and heterogeneous endometrium. No definite other primary malignancy identified in the abdomen or pelvis. 3. Indeterminate hypodense lesion posteriorly in the right lobe of the liver, corresponding with the ultrasound finding, worrisome for metastatic disease based on the other findings. 4. Small right pleural effusion with dependent right lower lobe atelectasis. 5. Appropriate oncology referral recommended for additional diagnosis and staging. PET-CT may be helpful.  Labs in the ER included Hgb at 5.9, Hct 22, PLT 593, WBC 11.3, fecal occult +, creatinine 0.70, albumin 4.3, AST 13, ALT 6, alk phos 78, iron 9, ferritin 8. CA 125, CEA, CA 19-9 pending.    Her  past medical history includes iron deficiency anemia, HTN, pre-diabetes, erosive gastritis and + h pylori (completed antibiotics for this 2 weeks ago). She had an EGD on 05/08/2023, in June 2023 prior to this.   Her gynecologic history includes dysfunctional uterine bleeding. She was seen by her GYN, Dr. Alysia Penna, in June 2023 and an ultrasound was ordered. 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 The ultrasound was performed on 04/18/2022 and returned with an endometrial stripe of 6 mm in thickness, normal appearance of the uterus and left ovary with right ovary not visualized. She had follow up with Dr. Alysia Penna in the office on 05/22/2023 with the note stating the patient wanted to follow her bleeding pattern with follow up in three months. No endometrial biopsy was performed at that time.     Of note, she had an abnormal mammogram in 02/2022 with repeat breast imaging noting a 7 mm mass in the left breast.  On 06/10/2022, she underwent a left breat lumpectomy with Dr. Carolynne Edouard. 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   Interval History: Reports her symptoms started one and half years ago. She went to ER around that time for nausea and stomach pain and followed up with gastroenterology. She was prescribed linzess around that time. She does note last month, the linzess stopped working. Last bowel movement was on Wednesday. No flatus reported.   Saw gyn around 1.5 years ago. Never had regular periods and had a menses with clump-like drainage. Has never gone 12 months without a period. Abdominal pain comes and goes. Describes pain and  tightness in the upper abdomen. Having light spotting this am. Today, she could not stop vomiting during NG tube placement in IR and placement was unsuccessful.    Review of Systems: See HPI/interval. Additional  review negative.  Current Meds: Current inpatient meds reviewed.  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  Tobacco Use   Smoking status: Never   Smokeless tobacco: Never  Vaping Use   Vaping status: Never Used  Substance and Sexual Activity   Alcohol use: No   Drug use: No   Sexual activity: Not Currently    Birth control/protection: Surgical  Other Topics Concern   Not on file  Social History Narrative   Not on file   Social Determinants of Health   Financial Resource Strain: Not on file  Food Insecurity: No Food Insecurity (06/20/2023)   Hunger Vital Sign    Worried About Running Out of Food in the Last Year: Never true    Ran Out of Food in the Last Year: Never true  Transportation Needs: No Transportation Needs (06/20/2023)   PRAPARE - Administrator, Civil Service (Medical): No    Lack of Transportation (Non-Medical): No  Physical Activity: Not on file  Stress: Not on file  Social Connections: Not on file  Intimate Partner Violence: Not At Risk (06/20/2023)   Humiliation, Afraid, Rape, and Kick questionnaire    Fear of Current or Ex-Partner: No    Emotionally Abused: No    Physically Abused: No    Sexually Abused: No    Past Surgical Hx:  Past Surgical History:  Procedure Laterality Date   BREAST LUMPECTOMY WITH RADIOACTIVE SEED LOCALIZATION Left 06/10/2022   Procedure: LEFT BREAST LUMPECTOMY WITH RADIOACTIVE SEED LOCALIZATION;  Surgeon: Chevis Pretty III, MD;  Location: [facility];  Service: General;  Laterality: Left;   CESAREAN SECTION     x4   TUBAL LIGATION      Past Medical Hx:  Past Medical History:  Diagnosis Date   Anemia    Hypertension    Pre-diabetes  02/2022    Family Hx:  Family History  Problem Relation Age of Onset   Memory loss Mother    Hypertension Father    Hypertension Brother    Diabetes Maternal Grandmother    Hypertension Maternal Grandmother    Hypertension Maternal Grandfather    Colon cancer Neg Hx    Stomach cancer Neg Hx    Esophageal cancer Neg Hx     Vitals:  Blood pressure 125/71, pulse 65, temperature 98.3 F (36.8 C), temperature source Oral, resp. rate 17, height 5\' 4"  (1.626 m), weight 206 lb 9.1 oz (93.7 kg), SpO2 97%.  CBC    Component Value Date/Time   WBC 12.6 (H) 06/24/2023 0304   RBC 3.93 06/24/2023 0304   HGB 8.7 (L) 06/24/2023 0304   HGB 10.3 (L) 05/16/2022 1044   HCT 30.4 (L) 06/24/2023 0304   HCT 33.9 (L) 05/16/2022 1044   PLT 556 (H) 06/24/2023 0304   PLT 364 05/16/2022 1044   MCV 77.4 (L) 06/24/2023 0304   MCV 80 05/16/2022 1044   MCH 22.1 (L) 06/24/2023 0304   MCHC 28.6 (L)  06/24/2023 0304   RDW 24.8 (H) 06/24/2023 0304   RDW 19.4 (H) 05/16/2022 1044   LYMPHSABS 1.2 06/24/2023 0304   MONOABS 1.2 (H) 06/24/2023 0304   EOSABS 0.1 06/24/2023 0304   BASOSABS 0.0 06/24/2023 0304  BMET    Component Value Date/Time   NA 133 (L) 06/24/2023 0304   NA 141 05/16/2022 1044   K 3.6 06/24/2023 0304   CL 101 06/24/2023 0304   CO2 21 (L) 06/24/2023 0304   GLUCOSE 131 (H) 06/24/2023 0304   BUN 29 (H) 06/24/2023 0304   BUN 9 05/16/2022 1044   CREATININE 1.64 (H) 06/24/2023 0304   CALCIUM 9.2 06/24/2023 0304   EGFR 88 05/16/2022 1044   GFRNONAA 37 (L) 06/24/2023 0304     Physical Exam:  Alert, oriented, in no acute distress. Lungs clear, unlabored breathing. Heart regular in rate and rhythm. Abdomen distended, mildly tympanic, hypoactive bowel sounds No lower extrem edema appreciated. With patient consent, an endometrial biopsy was performed at the bedside by Dr. Alvester Morin. Patient tolerated well. Specimen sent to path for rush.   Assessment/Plan: 57 year old female currently admitted  with CT findings concerning for metastatic disease. Endometrial biopsy performed today and path to be rushed. Tumor markers pending. Plan moving forward discussed with the patient and her son by Dr. Alvester Morin. See addition to note for additional details.   Doylene Bode, NP 06/24/2023, 9:46 AM

## 2023-06-24 NOTE — Progress Notes (Signed)
Nutrition Follow-up  INTERVENTION:   Monitor magnesium, potassium, and phosphorus for at least 3 days, MD to replete as needed, as pt is at risk for refeeding syndrome.   -TPN management per Pharmacy -Continue 100 mg Thiamine daily x 5 days -Daily weights while on TPN  NUTRITION DIAGNOSIS:   Inadequate oral intake related to chronic illness as evidenced by per patient/family report.  Ongoing.  GOAL:   Patient will meet greater than or equal to 90% of their needs  Progressing.  MONITOR:   Labs, Weight trends, I & O's (TPN)  ASSESSMENT:   57 yo female with PMH anemia, HTN, pre-DM who presented with abdominal pain.  She recently underwent EGD on 05/08/2023.  She was started on H. pylori treatment after procedure.  She completed treatment course approximately 2 weeks prior to admission.  She has also been on oral iron and having dark stools. She was admitted for blood transfusion and GI evaluation.  Patient in room, states she last tolerated food before 8/29. States her food didn't stay down then, which was jello and applesauce. She could not tell me when she last tolerated solids.  Pt to have NGT placement in IR today.   TPN to increase to 60 ml/hr today, providing 1365 kcals and 67g protein.   Admission weight: 206 lbs Daily weights are ordered via TPN protocol.   Medications: Thiamine  Labs reviewed: CBGs: 102-143 Low Na Elevated Phos Elevated Mg   NUTRITION - FOCUSED PHYSICAL EXAM:  No depletions noted.  Diet Order:   Diet Order             Diet NPO time specified Except for: Sips with Meds  Diet effective now                   EDUCATION NEEDS:   Not appropriate for education at this time  Skin:  Skin Assessment: Reviewed RN Assessment  Last BM:  8/28  Height:   Ht Readings from Last 1 Encounters:  06/20/23 5\' 4"  (1.626 m)    Weight:   Wt Readings from Last 1 Encounters:  06/20/23 93.7 kg    BMI:  Body mass index is 35.46  kg/m.  Estimated Nutritional Needs:   Kcal:  1700-1900  Protein:  80-90g  Fluid:  1.9L/day   Tilda Franco, MS, RD, LDN Inpatient Clinical Dietitian Contact information available via Amion

## 2023-06-24 NOTE — Progress Notes (Signed)
PHARMACY - TOTAL PARENTERAL NUTRITION CONSULT NOTE   Indication: Small bowel obstruction  Patient Measurements: Height: 5\' 4"  (162.6 cm) Weight: 93.7 kg (206 lb 9.1 oz) IBW/kg (Calculated) : 54.7 TPN AdjBW (KG): 64.5 Body mass index is 35.46 kg/m. Usual Weight:   Assessment:  Pharmacy consulted to start TPN on 57 yo female diagnosed with small bowel obstruction.   Glucose / Insulin:  -  PMH pre-DM on semaglutide PTA, A1c 6.5 on 12/05/22 - CBG on TPN @ 40 ml/hr 102 & 143, serum gluc 131 (goal <150)  2 U SSI given Electrolytes:  - Na is low at 133 - K+ 3.6 ( 1 run given yesterday. 4 ordered but pt refused 3 runs)  - Magnesium is elevated at 2.9 (none in TPN)  - phos elevated at 6.1 (will remove from TPN) Renal:  SCr up to 1.65, BUN up to 29 No UOP recorded for 9/2 Hepatic:  - LFT and alk phos WNL ( 8/30 )  Intake / Output; MIVF:  - no UOP recorded 9/2 - failed NGT placement x 2, IR consulted for NGT New order for NS @ 35 ml/hr  GI Imaging: - 9/1 Distal small-bowel obstruction with transition point in the right lower quadrant, likely associated with a complex mass in the right lower quadrant. Patient may benefit from nasogastric tube decompression and general surgical consultation. Concern for malignancy GI Surgeries / Procedures:   Central access: PICC placed for TPN 9/2  TPN start date: 9/2  Nutritional Goals: Goal TPN rate is 80 mL/hr (provides ~ 90 g of protein and  1820 kcals per day)  RD Assessment: Estimated Needs Total Energy Estimated Needs: 1700-1900 Total Protein Estimated Needs: 80-90g Total Fluid Estimated Needs: 1.9L/day  Current Nutrition:  NPO TPN @ 40 ml/hr  Plan:  increase TPN to 60 mL/hr at 1800 (goal rate is 80 ml/hr)  Electrolytes in TPN:  Na 100 mEq/L,  K 50 mEq/L,  Ca 3mEq/L,  Mg 0 mEq/L Phos 0 mmol/L.  Cl:Ac 1:1 Add standard MVI and trace elements to TPN Thiamine 100 mg IV every day x 5 days Continue Sensitive q6h SSI and adjust  as needed  NS @ 35 ml/hr per TRH  BMP, magnesium, phosphorus ordered x 5 days  Monitor TPN labs on Mon/Thurs  Herby Abraham, Pharm.D Use secure chat for questions 06/24/2023 8:26 AM

## 2023-06-25 ENCOUNTER — Telehealth: Payer: Self-pay | Admitting: Oncology

## 2023-06-25 DIAGNOSIS — K922 Gastrointestinal hemorrhage, unspecified: Secondary | ICD-10-CM | POA: Diagnosis not present

## 2023-06-25 LAB — CBC WITH DIFFERENTIAL/PLATELET
Abs Immature Granulocytes: 0.05 10*3/uL (ref 0.00–0.07)
Basophils Absolute: 0 10*3/uL (ref 0.0–0.1)
Basophils Relative: 0 %
Eosinophils Absolute: 0.1 10*3/uL (ref 0.0–0.5)
Eosinophils Relative: 0 %
HCT: 29.9 % — ABNORMAL LOW (ref 36.0–46.0)
Hemoglobin: 8.5 g/dL — ABNORMAL LOW (ref 12.0–15.0)
Immature Granulocytes: 0 %
Lymphocytes Relative: 10 %
Lymphs Abs: 1.2 10*3/uL (ref 0.7–4.0)
MCH: 22.4 pg — ABNORMAL LOW (ref 26.0–34.0)
MCHC: 28.4 g/dL — ABNORMAL LOW (ref 30.0–36.0)
MCV: 78.7 fL — ABNORMAL LOW (ref 80.0–100.0)
Monocytes Absolute: 1.2 10*3/uL — ABNORMAL HIGH (ref 0.1–1.0)
Monocytes Relative: 10 %
Neutro Abs: 9.8 10*3/uL — ABNORMAL HIGH (ref 1.7–7.7)
Neutrophils Relative %: 80 %
Platelets: 496 10*3/uL — ABNORMAL HIGH (ref 150–400)
RBC: 3.8 MIL/uL — ABNORMAL LOW (ref 3.87–5.11)
RDW: 26.4 % — ABNORMAL HIGH (ref 11.5–15.5)
WBC: 12.3 10*3/uL — ABNORMAL HIGH (ref 4.0–10.5)
nRBC: 0 % (ref 0.0–0.2)

## 2023-06-25 LAB — PHOSPHORUS: Phosphorus: 3.4 mg/dL (ref 2.5–4.6)

## 2023-06-25 LAB — GLUCOSE, CAPILLARY
Glucose-Capillary: 145 mg/dL — ABNORMAL HIGH (ref 70–99)
Glucose-Capillary: 149 mg/dL — ABNORMAL HIGH (ref 70–99)
Glucose-Capillary: 151 mg/dL — ABNORMAL HIGH (ref 70–99)
Glucose-Capillary: 160 mg/dL — ABNORMAL HIGH (ref 70–99)

## 2023-06-25 LAB — BASIC METABOLIC PANEL
Anion gap: 10 (ref 5–15)
BUN: 27 mg/dL — ABNORMAL HIGH (ref 6–20)
CO2: 24 mmol/L (ref 22–32)
Calcium: 9.2 mg/dL (ref 8.9–10.3)
Chloride: 105 mmol/L (ref 98–111)
Creatinine, Ser: 1.19 mg/dL — ABNORMAL HIGH (ref 0.44–1.00)
GFR, Estimated: 54 mL/min — ABNORMAL LOW (ref >60)
Glucose, Bld: 137 mg/dL — ABNORMAL HIGH (ref 70–99)
Potassium: 3.5 mmol/L (ref 3.5–5.1)
Sodium: 139 mmol/L (ref 135–145)

## 2023-06-25 LAB — MAGNESIUM: Magnesium: 2.4 mg/dL (ref 1.7–2.4)

## 2023-06-25 MED ORDER — TRAVASOL 10 % IV SOLN
INTRAVENOUS | Status: AC
  Filled 2023-06-25: qty 902.4

## 2023-06-25 MED ORDER — SODIUM CHLORIDE 0.9 % IV SOLN: INTRAVENOUS | Status: AC

## 2023-06-25 MED ORDER — MELATONIN 5 MG PO TABS
5.0000 mg | ORAL_TABLET | Freq: Every evening | ORAL | Status: AC | PRN
  Administered 2023-06-25 – 2023-07-08 (×4): 5 mg via ORAL
  Filled 2023-06-25 (×4): qty 1

## 2023-06-25 MED ORDER — POTASSIUM CHLORIDE 10 MEQ/50ML IV SOLN
10.0000 meq | INTRAVENOUS | Status: AC
  Administered 2023-06-25 (×2): 10 meq via INTRAVENOUS
  Filled 2023-06-25 (×2): qty 50

## 2023-06-25 NOTE — Telephone Encounter (Signed)
Called and emailed [facility] and requested a rush on accession [phone].

## 2023-06-25 NOTE — Progress Notes (Signed)
PROGRESS NOTE    Linda [name]  ZOX:[id number] DOB: December 02, 1965 DOA: 06/20/2023 PCP: Arnette Felts, [name]   Brief Narrative:  Linda [name] is a 57 yo female with PMH anemia, HTN, pre-DM who presented with abdominal pain. CT abdomen/pelvis obtained to rule out possible gallbladder pathology -unfortunately notable for multiple masses in the right lower quadrant superior cervix with dilated loops of bowel concerning for obstruction versus compression. Due to these findings, further workup commenced with consulting surgery, oncology, and gyn-oncology. Tumor markers ordered as well.   Assessment & Plan:   Principal Problem:   Upper GI bleed Active Problems:   Abdominal mass   IDA (iron deficiency anemia)   SBO (small bowel obstruction) (HCC)   Abnormal uterine bleeding   Essential hypertension   Type 2 diabetes mellitus with obesity (HCC)   Thickened endometrium   Acute symptomatic anemia, likely secondary to subacute versus chronic GI bleed -Previously completed H. pylori treatment for presumed gastritis per prior endoscopy -Stools have remained melanotic in appearance but she states she has also been on oral iron daily -Hemoglobin  markedly low on admission, 5.9 g/dL.   -Hemoglobin remained stable after transfusion, continue to follow -GI consulted at intake   Abdominal mass/concern for metastatic disease, POA Small bowel obstruction, POA - 4.7 x 3 cm mass in RLL and 7.9 x 6.1 cm mass superior to cervix noted on CT. -Concern is GYN malignancy. 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  Other small scattered lesions also appreciated on imaging -Tumor markers ordered for further workup.  CEA, CA 19-9, CA125 -Oncology and GYN oncology have been consulted - IR consulted to see if able to biopsy any of these multiple secondary lesions - Core biopsy 9/3 of the endometrium histology benign per discussion with gyn-onc   IDA (iron deficiency anemia) - iron  labs: Iron 9, sat ratio 2%, TIBC 444. Ferritin 8 - s/p 2 units PRBC so far - also received 2 days of Ferrlecit, but discontinued further doses in setting of suspected malignancy - needs repeat iron studies in 3-4 months   Severe protein caloric malnutrition  SBO (small bowel obstruction) (HCC) -Secondary to above, NG tube unable to be placed by staff or interventional radiology due to intolerance/pain - leaving NGT out for now as she's comfortable but if develops vomiting again, may need to retry bedside with increased anxiolytics/analgesics -PICC placed, TPN being titrated up per pharmacy   Type 2 diabetes mellitus with obesity (HCC) - Last A1c 6.5% on 12/05/2022 - SSI ordered in setting of TPN use    Essential hypertension - hold ARB in setting of above - PRN meds to be added; can make scheduled if needed  DVT prophylaxis: SCDs Start: 06/20/23 2059   Code Status:   Code Status: Full Code  Family Communication: None present  Status is: Inpatient  Dispo: The patient is from: Home              Anticipated d/c is to: To be determined              Anticipated d/c date is: To be determined               Patient currently not medically stable for discharge  Consultants:  Gynecology oncology, general surgery, interventional radiology  Procedures:  Biopsy, multiple, as above  Antimicrobials:  None indicated  Subjective: No acute issues or events overnight, denies nausea vomiting diarrhea headache fevers chills or chest pain  Objective: Vitals:  07/15/2023 1600 07/15/23 2115 06/25/23 0500 06/25/23 0534  BP: 103/76 116/62  (!) 118/58  Pulse:  72  71  Resp: 16 18  17   Temp:  98.5 F (36.9 C)  98.7 F (37.1 C)  TempSrc:  Oral  Oral  SpO2: 100% 97%  97%  Weight:   91.6 kg   Height:        Intake/Output Summary (Last 24 hours) at 06/25/2023 0725 Last data filed at 06/25/2023 0400 Gross per 24 hour  Intake 2153.22 ml  Output --  Net 2153.22 ml   Filed Weights    06/20/23 2049 06/25/23 0500  Weight: 93.7 kg 91.6 kg    Examination:  General:  Pleasantly resting in bed, No acute distress. HEENT:  Normocephalic atraumatic.  Sclerae nonicteric, noninjected.  Extraocular movements intact bilaterally. Neck:  Without mass or deformity.  Trachea is midline. Lungs:  Clear to auscultate bilaterally without rhonchi, wheeze, or rales. Heart:  Regular rate and rhythm.  Without murmurs, rubs, or gallops. Abdomen:  Soft, nontender, nondistended.  Without guarding or rebound. Extremities: Without cyanosis, clubbing, edema, or obvious deformity. Skin:  Warm and dry, no erythema.   Data Reviewed: I have personally reviewed following labs and imaging studies  CBC: Recent Labs  Lab 06/21/23 0653 06/22/23 0619 06/23/23 0537 07/15/2023 0304 06/25/23 0143  WBC 8.2 8.4 12.2* 12.6* 12.3*  NEUTROABS 6.5 6.1 9.4* 10.1* 9.8*  HGB 7.0* 8.1* 8.3* 8.7* 8.5*  HCT 24.4* 28.0* 29.8* 30.4* 29.9*  MCV 71.3* 75.3* 76.0* 77.4* 78.7*  PLT 525* 501* 534* 556* 496*   Basic Metabolic Panel: Recent Labs  Lab 06/21/23 0653 06/22/23 0619 06/23/23 0537 2023-07-15 0304 06/25/23 0143  NA 138 136 132* 133* 139  K 3.7 3.7 3.4* 3.6 3.5  CL 103 102 99 101 105  CO2 25 25 23  21* 24  GLUCOSE 129* 114* 86 131* 137*  BUN 12 11 12  29* 27*  CREATININE 0.90 0.89 0.91 1.64* 1.19*  CALCIUM 9.2 8.9 9.0 9.2 9.2  MG  --  2.6* 2.6* 2.9* 2.4  PHOS  --   --   --  6.1* 3.4   GFR: Estimated Creatinine Clearance: 58.6 mL/min (A) (by C-G formula based on SCr of 1.19 mg/dL (H)).  Liver Function Tests: Recent Labs  Lab 06/20/23 1436  AST 13*  ALT 6  ALKPHOS 78  BILITOT 0.6  PROT 8.7*  ALBUMIN 4.3   Coagulation Profile: Recent Labs  Lab 06/20/23 1436 July 15, 2023 0942  INR 1.1 1.2   CBG: Recent Labs  Lab 07/15/23 0627 Jul 15, 2023 1138 2023/07/15 1706 06/25/23 0003 06/25/23 0535  GLUCAP 143* 153* 130* 151* 149*   No results found for this or any previous visit (from the past 240  hour(s)).   Radiology Studies: CT BIOPSY  Result Date: 07-15-23 INDICATION: 57 year old with peritoneal lesions and needs a tissue diagnosis. EXAM: CT-GUIDED CORE BIOPSY OF A PERITONEAL NODULE MEDICATIONS: Moderate sedation ANESTHESIA/SEDATION: Moderate (conscious) sedation was employed during this procedure. A total of Versed 1.0 mg and Fentanyl 50 mcg was administered intravenously by the radiology nurse. Total intra-service moderate Sedation Time: 18 minutes. The patient's level of consciousness and vital signs were monitored continuously by radiology nursing throughout the procedure under my direct supervision. FLUOROSCOPY TIME:  None COMPLICATIONS: None immediate. PROCEDURE: Informed written consent was obtained from the patient after a thorough discussion of the procedural risks, benefits and alternatives. All questions were addressed. A timeout was performed prior to the initiation of the procedure. Patient was placed  supine on the CT scanner. Images through the abdomen were obtained. An anterior midline peritoneal nodule was identified near the umbilicus. The right side of the abdomen was prepped with chlorhexidine and sterile field was created. Skin was anesthetized using 1% lidocaine. Using CT guidance, 17 gauge coaxial needle was directed into the peritoneal nodule. Needle position was confirmed within the nodule. Total of 3 core biopsies were obtained with an 18 gauge device. Specimens placed in formalin. Needle was removed and follow up CT images were obtained. Bandage placed over the puncture site. RADIATION DOSE REDUCTION: This exam was performed according to the departmental dose-optimization program which includes automated exposure control, adjustment of the mA and/or kV according to patient size and/or use of iterative reconstruction technique. FINDINGS: CT images demonstrate diffuse dilatation of small bowel loops compatible with small bowel obstruction and findings are similar to the  previous diagnostic CT. Peritoneal nodule in the anterior midline just posterior to the umbilicus was targeted. Coaxial needle was confirmed within the nodule. Three adequate core specimens were obtained. IMPRESSION: CT-guided core biopsy of an anterior abdominal peritoneal nodule. Electronically Signed   By: Richarda Overlie M.D.   On: 06/24/2023 16:50   DG Fluoro Rm 1-60 Min - No Report  Result Date: 06/24/2023 Fluoroscopy was utilized by the requesting physician.  No radiographic interpretation.   [place] EKG SITE RITE  Result Date: 06/23/2023 If Site Rite image not attached, placement could not be confirmed due to current cardiac rhythm.  [place] EKG SITE RITE  Result Date: 06/23/2023 If Site Rite image not attached, placement could not be confirmed due to current cardiac rhythm.   Scheduled Meds:  Chlorhexidine Gluconate Cloth  6 each Topical Daily   insulin aspart  0-9 Units Subcutaneous Q6H   pantoprazole (PROTONIX) IV  40 mg Intravenous Q12H   sodium chloride flush  10-40 mL Intracatheter Q12H   sodium chloride flush  3 mL Intravenous Q12H   thiamine (VITAMIN B1) injection  100 mg Intravenous Daily   Continuous Infusions:  sodium chloride 35 mL/hr at 06/24/23 0945   potassium chloride     TPN ADULT (ION) 60 mL/hr at 06/24/23 1803     LOS: 5 days   Time spent:  Azucena Fallen, DO [facility]  If 7PM-7AM, please contact night-coverage [email or web address]  06/25/2023, 7:25 AM

## 2023-06-25 NOTE — Progress Notes (Signed)
PHARMACY - TOTAL PARENTERAL NUTRITION CONSULT NOTE   Indication: Small bowel obstruction  Patient Measurements: Height: 5\' 4"  (162.6 cm) Weight: 91.6 kg (201 lb 15.1 oz) IBW/kg (Calculated) : 54.7 TPN AdjBW (KG): 64.5 Body mass index is 34.66 kg/m.   Assessment:  Pharmacy consulted to start TPN on 57 yo female diagnosed with small bowel obstruction.   Glucose / Insulin:  - PMH of pre-DM on semaglutide PTA, A1c 6.5 on 12/05/22 - CBGs in past 24h 130-153 (goal <150)  - 6 units SSI given Electrolytes: all now WNL (K+ on lower end of goal range, Mg on upper end of goal range) Renal:  - SCr elevated but improved 1.64 > 1.19, BUN elevated but improved 29 > 27 Hepatic:  - AST low. ALT, Alk Phos, Tbili WNL (8/30) - Triglycerides ordered for AM Intake / Output; MIVF:  - no UOP recorded 9/3, but RN reports patient making urine - failed NGT placement x 2 - MIVF: NS @ 35 mL/hr  GI Imaging: - 9/1 Distal small-bowel obstruction with transition point in the right lower quadrant, likely associated with a complex mass in the right lower quadrant. Patient may benefit from nasogastric tube decompression and general surgical consultation. Concern for malignancy.  GI Surgeries / Procedures:   Central access: PICC placed for TPN 9/2  TPN start date: 9/2  Nutritional Goals: Goal TPN rate is 80 mL/hr (provides ~ 90 g of protein and  1820 kcals per day)  RD Assessment: Estimated Needs Total Energy Estimated Needs: 1700-1900 Total Protein Estimated Needs: 80-90g Total Fluid Estimated Needs: 1.9L/day  Current Nutrition:  NPO TPN @ 60 mL/hr  Plan: KCl IV x 2 doses now  Increase TPN to goal rate of 80 mL/hr at 1800   Electrolytes in TPN:  Na 100 mEq/L  K 55 mEq/L  Ca 101mEq/L Mg 0 mEq/L Phos 3 mmol/L  Cl:Ac 1:1 Add standard MVI and trace elements to TPN Thiamine 100 mg IV every day x 5 days Continue Sensitive q6h SSI and adjust as needed  Decrease NS to 10 mL/hr per discussion  with MD   Monitor TPN labs on Mon/Thurs and as needed   Greer Pickerel, PharmD, BCPS Clinical Pharmacist 06/25/2023 6:20 AM

## 2023-06-25 NOTE — Consult Note (Signed)
GYN-ONC Consultation Progress Note   Patient: Linda [name] VOZ:[id number] DOB: 18-Sep-1966 DOA: 06/20/2023 DOS: the patient was seen and examined on 06/25/2023 Primary service: Azucena Fallen, MD  Brief hospital course: S/P exam at the beside yesterday w/EMB. The histology was benign.  The histology from the image-guided bx and tumor markers are pending  Assessment and Plan: Suspicion for advanced gynecologic malignancy.    >review the histology from the image-guided bx and tumor markers >will continue to follow     Subjective: Comfortable, tired  Physical Exam: Vitals:   06/24/23 1600 06/24/23 2115 06/25/23 0500 06/25/23 0534  BP: 103/76 116/62  (!) 118/58  Pulse:  72  71  Resp: 16 18  17   Temp:  98.5 F (36.9 C)  98.7 F (37.1 C)  TempSrc:  Oral  Oral  SpO2: 100% 97%  97%  Weight:   91.6 kg   Height:       Abd: distended, tympanic, NT Ext: NT Data Reviewed: ENDOMETRIUM, BIOPSY:  Benign endometrial polyp  Benign inactive to weakly proliferative endometrial epithelium  Negative for breakdown, atypia, hyperplasia and carcinoma   Results are pending, will review when available.      Author: Antionette Char, MD 06/25/2023 12:34 PM

## 2023-06-25 NOTE — Plan of Care (Signed)
Pt continues to progress during this shift.

## 2023-06-25 NOTE — Progress Notes (Signed)
Progress Note     Subjective: Pt unable to get NGT placed again yesterday but denies further nausea or vomiting. No flatus or BM. Burping and still feels distended. Upper abdominal pressure   Objective: Vital signs in last 24 hours: Temp:  [97.6 F (36.4 C)-98.7 F (37.1 C)] 98.7 F (37.1 C) (09/04 0534) Pulse Rate:  [71-74] 71 (09/04 0534) Resp:  [12-18] 17 (09/04 0534) BP: (97-126)/(58-76) 118/58 (09/04 0534) SpO2:  [97 %-100 %] 97 % (09/04 0534) Weight:  [91.6 kg] 91.6 kg (09/04 0500) Last BM Date : 06/18/23  Intake/Output from previous day: 09/03 0701 - 09/04 0700 In: 2153.2 [I.V.:2153.2] Out: -  Intake/Output this shift: No intake/output data recorded.  PE: General: pleasant, WD, WN female who is laying in bed in NAD Heart: regular, rate, and rhythm.  Lungs: Respiratory effort nonlabored Abd: soft, mild ttp in epigastrium, moderately distended, BS hypoactive Psych: A&Ox3 with an appropriate affect.    Lab Results:  Recent Labs    06/24/23 0304 06/25/23 0143  WBC 12.6* 12.3*  HGB 8.7* 8.5*  HCT 30.4* 29.9*  PLT 556* 496*   BMET Recent Labs    06/24/23 0304 06/25/23 0143  NA 133* 139  K 3.6 3.5  CL 101 105  CO2 21* 24  GLUCOSE 131* 137*  BUN 29* 27*  CREATININE 1.64* 1.19*  CALCIUM 9.2 9.2   PT/INR Recent Labs    06/24/23 0942  LABPROT 15.6*  INR 1.2   CMP     Component Value Date/Time   NA 139 06/25/2023 0143   NA 141 05/16/2022 1044   K 3.5 06/25/2023 0143   CL 105 06/25/2023 0143   CO2 24 06/25/2023 0143   GLUCOSE 137 (H) 06/25/2023 0143   BUN 27 (H) 06/25/2023 0143   BUN 9 05/16/2022 1044   CREATININE 1.19 (H) 06/25/2023 0143   CALCIUM 9.2 06/25/2023 0143   PROT 8.7 (H) 06/20/2023 1436   PROT 7.8 05/16/2022 1044   ALBUMIN 4.3 06/20/2023 1436   ALBUMIN 4.4 05/16/2022 1044   AST 13 (L) 06/20/2023 1436   ALT 6 06/20/2023 1436   ALKPHOS 78 06/20/2023 1436   BILITOT 0.6 06/20/2023 1436   BILITOT <0.2 05/16/2022 1044    GFRNONAA 54 (L) 06/25/2023 0143   Lipase     Component Value Date/Time   LIPASE 35 06/20/2023 1436       Studies/Results: CT BIOPSY  Result Date: 06/24/2023 INDICATION: 57 year old with peritoneal lesions and needs a tissue diagnosis. EXAM: CT-GUIDED CORE BIOPSY OF A PERITONEAL NODULE MEDICATIONS: Moderate sedation ANESTHESIA/SEDATION: Moderate (conscious) sedation was employed during this procedure. A total of Versed 1.0 mg and Fentanyl 50 mcg was administered intravenously by the radiology nurse. Total intra-service moderate Sedation Time: 18 minutes. The patient's level of consciousness and vital signs were monitored continuously by radiology nursing throughout the procedure under my direct supervision. FLUOROSCOPY TIME:  None COMPLICATIONS: None immediate. PROCEDURE: Informed written consent was obtained from the patient after a thorough discussion of the procedural risks, benefits and alternatives. All questions were addressed. A timeout was performed prior to the initiation of the procedure. Patient was placed supine on the CT scanner. Images through the abdomen were obtained. An anterior midline peritoneal nodule was identified near the umbilicus. The right side of the abdomen was prepped with chlorhexidine and sterile field was created. Skin was anesthetized using 1% lidocaine. Using CT guidance, 17 gauge coaxial needle was directed into the peritoneal nodule. Needle position was confirmed within the nodule.  Total of 3 core biopsies were obtained with an 18 gauge device. Specimens placed in formalin. Needle was removed and follow up CT images were obtained. Bandage placed over the puncture site. RADIATION DOSE REDUCTION: This exam was performed according to the departmental dose-optimization program which includes automated exposure control, adjustment of the mA and/or kV according to patient size and/or use of iterative reconstruction technique. FINDINGS: CT images demonstrate diffuse  dilatation of small bowel loops compatible with small bowel obstruction and findings are similar to the previous diagnostic CT. Peritoneal nodule in the anterior midline just posterior to the umbilicus was targeted. Coaxial needle was confirmed within the nodule. Three adequate core specimens were obtained. IMPRESSION: CT-guided core biopsy of an anterior abdominal peritoneal nodule. Electronically Signed   By: Richarda Overlie M.D.   On: 06/24/2023 16:50   DG Fluoro Rm 1-60 Min - No Report  Result Date: 06/24/2023 Fluoroscopy was utilized by the requesting physician.  No radiographic interpretation.   [place] EKG SITE RITE  Result Date: 06/23/2023 If Site Rite image not attached, placement could not be confirmed due to current cardiac rhythm.  [place] EKG SITE RITE  Result Date: 06/23/2023 If Site Rite image not attached, placement could not be confirmed due to current cardiac rhythm.   Anti-infectives: Anti-infectives (From admission, onward)    None        Assessment/Plan  Newly identified pelvic mass - ?GYN etiology. Concern for carcinomatosis on imaging, GYN-ONC following and did endometrial bx yesterday. Tumor markers pending. IR biopsied peritoneal lesion yesterday SBO  - no hx of prior abdominal surgery  - NGT was not placed yesterday but no further n/v. Not having bowel function  - agree with fluoro guided NGT placement for symptom relief, may need ativan with placement to try to prevent gagging  - TPN ordered by [facility] - no plans for emergent surgical intervention, await NGT placement  UGI bleeding - GI following, PPI, s/p EGD 05/08/23. Hgb 8.5 this AM, stable  FEN: NPO VTE: chemical prophylaxis on hold in setting of GI bleed ID: no current abx  - per [facility] -  IDA T2DM HTN  LOS: 5 days   I reviewed Consultant GYN-ONC and IR notes, hospitalist notes, last 24 h vitals and pain scores, last 48 h intake and output, last 24 h labs and trends, and last 24 h imaging results.   Juliet Rude, [facility] 06/25/2023, 9:32 AM Please see Amion for pager number during day hours 7:00am-4:30pm

## 2023-06-25 NOTE — Plan of Care (Signed)

## 2023-06-26 DIAGNOSIS — K922 Gastrointestinal hemorrhage, unspecified: Secondary | ICD-10-CM | POA: Diagnosis not present

## 2023-06-26 LAB — CBC WITH DIFFERENTIAL/PLATELET
Abs Immature Granulocytes: 0.06 10*3/uL (ref 0.00–0.07)
Basophils Absolute: 0 10*3/uL (ref 0.0–0.1)
Basophils Relative: 0 %
Eosinophils Absolute: 0 10*3/uL (ref 0.0–0.5)
Eosinophils Relative: 0 %
HCT: 28.9 % — ABNORMAL LOW (ref 36.0–46.0)
Hemoglobin: 7.9 g/dL — ABNORMAL LOW (ref 12.0–15.0)
Immature Granulocytes: 1 %
Lymphocytes Relative: 11 %
Lymphs Abs: 1.1 10*3/uL (ref 0.7–4.0)
MCH: 22.1 pg — ABNORMAL LOW (ref 26.0–34.0)
MCHC: 27.3 g/dL — ABNORMAL LOW (ref 30.0–36.0)
MCV: 81 fL (ref 80.0–100.0)
Monocytes Absolute: 1.1 10*3/uL — ABNORMAL HIGH (ref 0.1–1.0)
Monocytes Relative: 11 %
Neutro Abs: 8.1 10*3/uL — ABNORMAL HIGH (ref 1.7–7.7)
Neutrophils Relative %: 77 %
Platelets: 429 10*3/uL — ABNORMAL HIGH (ref 150–400)
RBC: 3.57 MIL/uL — ABNORMAL LOW (ref 3.87–5.11)
RDW: 27.1 % — ABNORMAL HIGH (ref 11.5–15.5)
WBC: 10.5 10*3/uL (ref 4.0–10.5)
nRBC: 0 % (ref 0.0–0.2)

## 2023-06-26 LAB — COMPREHENSIVE METABOLIC PANEL
ALT: 19 U/L (ref 0–44)
AST: 22 U/L (ref 15–41)
Albumin: 3.4 g/dL — ABNORMAL LOW (ref 3.5–5.0)
Alkaline Phosphatase: 92 U/L (ref 38–126)
Anion gap: 8 (ref 5–15)
BUN: 17 mg/dL (ref 6–20)
CO2: 23 mmol/L (ref 22–32)
Calcium: 9.2 mg/dL (ref 8.9–10.3)
Chloride: 110 mmol/L (ref 98–111)
Creatinine, Ser: 0.84 mg/dL (ref 0.44–1.00)
GFR, Estimated: >60 mL/min (ref >60)
Glucose, Bld: 140 mg/dL — ABNORMAL HIGH (ref 70–99)
Potassium: 4.2 mmol/L (ref 3.5–5.1)
Sodium: 141 mmol/L (ref 135–145)
Total Bilirubin: 0.7 mg/dL (ref 0.3–1.2)
Total Protein: 8.1 g/dL (ref 6.5–8.1)

## 2023-06-26 LAB — GLUCOSE, CAPILLARY
Glucose-Capillary: 143 mg/dL — ABNORMAL HIGH (ref 70–99)
Glucose-Capillary: 117 mg/dL — ABNORMAL HIGH (ref 70–99)
Glucose-Capillary: 139 mg/dL — ABNORMAL HIGH (ref 70–99)
Glucose-Capillary: 134 mg/dL — ABNORMAL HIGH (ref 70–99)

## 2023-06-26 LAB — SURGICAL PATHOLOGY

## 2023-06-26 LAB — PHOSPHORUS: Phosphorus: 2.6 mg/dL (ref 2.5–4.6)

## 2023-06-26 LAB — MAGNESIUM: Magnesium: 2 mg/dL (ref 1.7–2.4)

## 2023-06-26 LAB — CEA: CEA: 104 ng/mL — ABNORMAL HIGH (ref 0.0–4.7)

## 2023-06-26 LAB — CA 125: Cancer Antigen (CA) 125: 39.9 U/mL — ABNORMAL HIGH (ref 0.0–38.1)

## 2023-06-26 LAB — TRIGLYCERIDES: Triglycerides: 93 mg/dL (ref <150)

## 2023-06-26 LAB — CANCER ANTIGEN 19-9: CA 19-9: 793 U/mL — ABNORMAL HIGH (ref 0–35)

## 2023-06-26 MED ORDER — TRAVASOL 10 % IV SOLN
INTRAVENOUS | Status: AC
  Filled 2023-06-26: qty 902.4

## 2023-06-26 NOTE — Plan of Care (Signed)
  Problem: Education: Goal: Knowledge of General Education information will improve Description: Including pain rating scale, medication(s)/side effects and non-pharmacologic comfort measures Outcome: Not Progressing   Problem: Health Behavior/Discharge Planning: Goal: Ability to manage health-related needs will improve Outcome: Not Progressing   Problem: Clinical Measurements: Goal: Ability to maintain clinical measurements within normal limits will improve Outcome: Not Progressing Goal: Will remain free from infection Outcome: Not Progressing Goal: Diagnostic test results will improve Outcome: Not Progressing Goal: Respiratory complications will improve Outcome: Not Progressing Goal: Cardiovascular complication will be avoided Outcome: Not Progressing   Problem: Nutrition: Goal: Adequate nutrition will be maintained Outcome: Not Progressing   Problem: Activity: Goal: Risk for activity intolerance will decrease Outcome: Not Progressing

## 2023-06-26 NOTE — Progress Notes (Signed)
Nutrition Follow-up  INTERVENTION:   Monitor magnesium, potassium, and phosphorus for at least 3 days, MD to replete as needed, as pt is at risk for refeeding syndrome.   -TPN management per Pharmacy -Continue 100 mg Thiamine daily x 5 days -Daily weights while on TPN  NUTRITION DIAGNOSIS:   Inadequate oral intake related to chronic illness as evidenced by per patient/family report.  Ongoing.  GOAL:   Patient will meet greater than or equal to 90% of their needs  Meeting with TPN  MONITOR:   Labs, Weight trends, I & O's (TPN)  ASSESSMENT:   57 yo female with PMH anemia, HTN, pre-DM who presented with abdominal pain.  She recently underwent EGD on 05/08/2023.  She was started on H. pylori treatment after procedure.  She completed treatment course approximately 2 weeks prior to admission.  She has also been on oral iron and having dark stools. She was admitted for blood transfusion and GI evaluation.  8/30: admitted, NPO-> CLD 8/31: NPO -> regular diet 9/1: Heart healthy diet -> NPO->CLD 9/2: TPN initiated, NPO  Patient has been unable to have NGT placed. No longer having vomiting.   TPN continues at goal rate of 80 ml/hr, providing 1820 kcals and 90g protein.  Admission weight: 206 lbs Current weight: 203 lbs  Medications: Thiamine, Zofran  Labs reviewed: CBGs: 117-160   Diet Order:   Diet Order             Diet NPO time specified  Diet effective now                   EDUCATION NEEDS:   Not appropriate for education at this time  Skin:  Skin Assessment: Reviewed RN Assessment  Last BM:  8/28  Height:   Ht Readings from Last 1 Encounters:  06/20/23 5\' 4"  (1.626 m)    Weight:   Wt Readings from Last 1 Encounters:  06/26/23 92.1 kg    BMI:  Body mass index is 34.85 kg/m.  Estimated Nutritional Needs:   Kcal:  1700-1900  Protein:  80-90g  Fluid:  1.9L/day   Tilda Franco, MS, RD, LDN Inpatient Clinical Dietitian Contact  information available via Amion

## 2023-06-26 NOTE — Progress Notes (Signed)
PHARMACY - TOTAL PARENTERAL NUTRITION CONSULT NOTE   Indication: Small bowel obstruction  Patient Measurements: Height: 5\' 4"  (162.6 cm) Weight: 92.1 kg (203 lb 0.7 oz) IBW/kg (Calculated) : 54.7 TPN AdjBW (KG): 64.5 Body mass index is 34.85 kg/m.   Assessment:  Pharmacy consulted to start TPN on 57 yo female diagnosed with small bowel obstruction.   Glucose / Insulin:  - PMH of pre-DM on semaglutide PTA, A1c 6.5 on 12/05/22 - CBGs in past 24h: 143-160 (goal <150)  - 4 units SSI given - of note, CBG not checked at scheduled 1800 time on 9/4 Electrolytes: all, including Corrected Calcium, now WNL. Na trending up. K+ increased to 4.2 after additional supplementation yesterday and increasing K+ in TPN. Mg and Phos improved, now trending down.  Renal:  - SCr now improved to WNL, 1.64 >> 0.84. BUN improved to WNL. Hepatic:  - AST/ALT, Alk Phos, Tbili WNL  - Triglycerides 93, WNL Intake / Output; MIVF:  - no UOP recorded, but RN reports patient making urine - failed NGT placement x 2 - MIVF: NS @ 10 mL/hr  GI Imaging: - 9/1 Distal small-bowel obstruction with transition point in the right lower quadrant, likely associated with a complex mass in the right lower quadrant. Patient may benefit from nasogastric tube decompression and general surgical consultation. Concern for malignancy.  GI Surgeries / Procedures:   Central access: PICC placed for TPN 9/2  TPN start date: 9/2  Nutritional Goals: Goal TPN rate is 80 mL/hr (provides ~ 90 g of protein and  1820 kcals per day)  RD Assessment: Estimated Needs Total Energy Estimated Needs: 1700-1900 Total Protein Estimated Needs: 80-90g Total Fluid Estimated Needs: 1.9L/day  Current Nutrition:  NPO TPN  Plan: Continue TPN at goal rate of 80 mL/hr Electrolytes in TPN:  Na 90 mEq/L  K 50 mEq/L  Ca 49mEq/L Mg 3 mEq/L Phos 7 mmol/L  Cl:Ac 1:2 Add standard MVI and trace elements to TPN Thiamine 100 mg IV every day x 5  days Continue Sensitive q6h SSI and adjust as needed  D/C IVF at 1800 Monitor TPN labs on Mon/Thurs and as needed BMET, Mg, Phos in AM   Greer Pickerel, PharmD, BCPS Clinical Pharmacist 06/26/2023 10:07 AM

## 2023-06-26 NOTE — Progress Notes (Signed)
GYN Oncology Progress Note  Patient alert, oriented, in no acute distress, resting in bed. Dr. Pricilla Holm discussed results of tumor markers, leaning towards a potential GI cause. IR biopsy of peritoneal nodule is pending which will provide additional information. Patient reports having nausea last pm but none today and no emesis. No flatus or BM reported. Abdomen feels tight. No needs voiced at this time. Continue plan of care.

## 2023-06-26 NOTE — Plan of Care (Signed)
  Problem: Education: Goal: Knowledge of General Education information will improve Description: Including pain rating scale, medication(s)/side effects and non-pharmacologic comfort measures Outcome: Progressing   Problem: Health Behavior/Discharge Planning: Goal: Ability to manage health-related needs will improve Outcome: Progressing   Problem: Clinical Measurements: Goal: Will remain free from infection Outcome: Progressing   Problem: Coping: Goal: Level of anxiety will decrease Outcome: Progressing   Problem: Pain Managment: Goal: General experience of comfort will improve Outcome: Progressing   Problem: Safety: Goal: Ability to remain free from injury will improve Outcome: Progressing

## 2023-06-26 NOTE — Progress Notes (Signed)
PROGRESS NOTE    Linda [name]  FIE:[id number] DOB: May 28, 1966 DOA: 06/20/2023 PCP: Arnette Felts, [name]   Brief Narrative:  Ms. Caines is a 57 yo female with PMH anemia, HTN, pre-DM who presented with abdominal pain. CT abdomen/pelvis obtained to rule out possible gallbladder pathology -unfortunately notable for multiple masses in the right lower quadrant superior cervix with dilated loops of bowel concerning for obstruction versus compression. Due to these findings, further workup commenced with consulting surgery, oncology, and gyn-oncology. Tumor markers ordered as well.   Assessment & Plan:   Principal Problem:   Upper GI bleed Active Problems:   Abdominal mass   IDA (iron deficiency anemia)   SBO (small bowel obstruction) (HCC)   Abnormal uterine bleeding   Essential hypertension   Type 2 diabetes mellitus with obesity (HCC)   Thickened endometrium  Acute symptomatic anemia, likely secondary to subacute versus chronic GI bleed -Previously completed H. pylori treatment for presumed gastritis per prior endoscopy -Stools have remained melanotic in appearance but she states she has also been on oral iron daily -Hemoglobin  markedly low on admission, 5.9 g/dL.   -Hemoglobin remained stable after transfusion, continue to follow -GI consulted at intake   Abdominal mass/concern for metastatic disease, POA Small bowel obstruction, POA - 4.7 x 3 cm mass in RLL and 7.9 x 6.1 cm mass superior to cervix noted on CT. -Concern is GYN malignancy. 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  Other small scattered lesions also appreciated on imaging -Tumor markers abnormal with CA125: 39.9, CA 19-9: 793, CEA: 104.0 -General Surgery, oncology, and GYN oncology continue to follow - IR completed peritoneal biopsy 9/3 - awaiting results - Core biopsy 9/3 of the endometrium histology benign per discussion with gyn-onc   Iron deficiency anemia - iron labs: Iron  9, sat ratio 2%, TIBC 444. Ferritin 8 - s/p 2 units PRBC so far - also received 2 days of Ferrlecit, but discontinued further doses in setting of suspected malignancy -continue to follow clinically - Recommend repeat iron studies in 3-4 months   Severe protein caloric malnutrition  SBO (small bowel obstruction) (HCC) -Secondary to above, NG tube unable to be placed by staff or interventional radiology due to intolerance/pain -Continue holding NG tube placement as she appears comfortable -discussed with patient if worsening symptoms would attempt NG placement with increased anxiolytics/analgesics -PICC placed, TPN being titrated up per pharmacy   Type 2 diabetes mellitus with obesity (HCC) - Last A1c 6.5% on 12/05/2022 - SSI ordered in setting of TPN use    Essential hypertension - hold ARB in setting of above - PRN meds to be added; can make scheduled if needed  DVT prophylaxis: SCDs Start: 06/20/23 2059   Code Status:   Code Status: Full Code  Family Communication: None present  Status is: Inpatient  Dispo: The patient is from: Home              Anticipated d/c is to: To be determined              Anticipated d/c date is: To be determined               Patient currently not medically stable for discharge  Consultants:  Gynecology oncology, general surgery, interventional radiology  Procedures:  Biopsy, multiple, as above  Antimicrobials:  None indicated  Subjective: No acute issues or events overnight, denies nausea vomiting diarrhea headache fevers chills or chest pain  Objective: Vitals:   06/25/23  1201 06/25/23 1943 06/26/23 0522 06/26/23 0700  BP: 116/60 127/60 137/78   Pulse: 73 71 69   Resp: 18 17 18    Temp: 99.1 F (37.3 C) 98.5 F (36.9 C) 98.4 F (36.9 C)   TempSrc: Oral Oral Oral   SpO2: 100% 99% 99%   Weight:    92.1 kg  Height:        Intake/Output Summary (Last 24 hours) at 06/26/2023 0727 Last data filed at 06/26/2023 0300 Gross per 24 hour   Intake 1127.72 ml  Output --  Net 1127.72 ml   Filed Weights   06/20/23 2049 06/25/23 0500 06/26/23 0700  Weight: 93.7 kg 91.6 kg 92.1 kg    Examination:  General:  Pleasantly resting in bed, No acute distress. HEENT:  Normocephalic atraumatic.  Sclerae nonicteric, noninjected.  Extraocular movements intact bilaterally. Neck:  Without mass or deformity.  Trachea is midline. Lungs:  Clear to auscultate bilaterally without rhonchi, wheeze, or rales. Heart:  Regular rate and rhythm.  Without murmurs, rubs, or gallops. Abdomen:  Soft, nontender, nondistended.  Without guarding or rebound. Extremities: Without cyanosis, clubbing, edema, or obvious deformity. Skin:  Warm and dry, no erythema.   Data Reviewed: I have personally reviewed following labs and imaging studies  CBC: Recent Labs  Lab 06/22/23 0619 06/23/23 0537 14-Jul-2023 0304 06/25/23 0143 06/26/23 0451  WBC 8.4 12.2* 12.6* 12.3* 10.5  NEUTROABS 6.1 9.4* 10.1* 9.8* 8.1*  HGB 8.1* 8.3* 8.7* 8.5* 7.9*  HCT 28.0* 29.8* 30.4* 29.9* 28.9*  MCV 75.3* 76.0* 77.4* 78.7* 81.0  PLT 501* 534* 556* 496* 429*   Basic Metabolic Panel: Recent Labs  Lab 06/22/23 0619 06/23/23 0537 07-14-2023 0304 06/25/23 0143 06/26/23 0451  NA 136 132* 133* 139 141  K 3.7 3.4* 3.6 3.5 4.2  CL 102 99 101 105 110  CO2 25 23 21* 24 23  GLUCOSE 114* 86 131* 137* 140*  BUN 11 12 29* 27* 17  CREATININE 0.89 0.91 1.64* 1.19* 0.84  CALCIUM 8.9 9.0 9.2 9.2 9.2  MG 2.6* 2.6* 2.9* 2.4 2.0  PHOS  --   --  6.1* 3.4 2.6   GFR: Estimated Creatinine Clearance: 83.3 mL/min (by C-G formula based on SCr of 0.84 mg/dL).  Liver Function Tests: Recent Labs  Lab 06/20/23 1436 06/26/23 0451  AST 13* 22  ALT 6 19  ALKPHOS 78 92  BILITOT 0.6 0.7  PROT 8.7* 8.1  ALBUMIN 4.3 3.4*   Coagulation Profile: Recent Labs  Lab 06/20/23 1436 2023/07/14 0942  INR 1.1 1.2   CBG: Recent Labs  Lab 06/25/23 0003 06/25/23 0535 06/25/23 1142 06/25/23 2343  06/26/23 0529  GLUCAP 151* 149* 145* 160* 143*   No results found for this or any previous visit (from the past 240 hour(s)).   Radiology Studies: CT BIOPSY  Result Date: 07-14-23 INDICATION: 57 year old with peritoneal lesions and needs a tissue diagnosis. EXAM: CT-GUIDED CORE BIOPSY OF A PERITONEAL NODULE MEDICATIONS: Moderate sedation ANESTHESIA/SEDATION: Moderate (conscious) sedation was employed during this procedure. A total of Versed 1.0 mg and Fentanyl 50 mcg was administered intravenously by the radiology nurse. Total intra-service moderate Sedation Time: 18 minutes. The patient's level of consciousness and vital signs were monitored continuously by radiology nursing throughout the procedure under my direct supervision. FLUOROSCOPY TIME:  None COMPLICATIONS: None immediate. PROCEDURE: Informed written consent was obtained from the patient after a thorough discussion of the procedural risks, benefits and alternatives. All questions were addressed. A timeout was performed prior to the  initiation of the procedure. Patient was placed supine on the CT scanner. Images through the abdomen were obtained. An anterior midline peritoneal nodule was identified near the umbilicus. The right side of the abdomen was prepped with chlorhexidine and sterile field was created. Skin was anesthetized using 1% lidocaine. Using CT guidance, 17 gauge coaxial needle was directed into the peritoneal nodule. Needle position was confirmed within the nodule. Total of 3 core biopsies were obtained with an 18 gauge device. Specimens placed in formalin. Needle was removed and follow up CT images were obtained. Bandage placed over the puncture site. RADIATION DOSE REDUCTION: This exam was performed according to the departmental dose-optimization program which includes automated exposure control, adjustment of the mA and/or kV according to patient size and/or use of iterative reconstruction technique. FINDINGS: CT images  demonstrate diffuse dilatation of small bowel loops compatible with small bowel obstruction and findings are similar to the previous diagnostic CT. Peritoneal nodule in the anterior midline just posterior to the umbilicus was targeted. Coaxial needle was confirmed within the nodule. Three adequate core specimens were obtained. IMPRESSION: CT-guided core biopsy of an anterior abdominal peritoneal nodule. Electronically Signed   By: Richarda Overlie M.D.   On: 06/24/2023 16:50   DG Fluoro Rm 1-60 Min - No Report  Result Date: 06/24/2023 Fluoroscopy was utilized by the requesting physician.  No radiographic interpretation.    Scheduled Meds:  Chlorhexidine Gluconate Cloth  6 each Topical Daily   insulin aspart  0-9 Units Subcutaneous Q6H   pantoprazole (PROTONIX) IV  40 mg Intravenous Q12H   sodium chloride flush  10-40 mL Intracatheter Q12H   sodium chloride flush  3 mL Intravenous Q12H   thiamine (VITAMIN B1) injection  100 mg Intravenous Daily   Continuous Infusions:  sodium chloride 10 mL/hr at 06/25/23 1713   TPN ADULT (ION) 80 mL/hr at 06/25/23 1838     LOS: 6 days   Time spent:  Azucena Fallen, DO [facility]  If 7PM-7AM, please contact night-coverage [email or web address]  06/26/2023, 7:27 AM

## 2023-06-27 DIAGNOSIS — K922 Gastrointestinal hemorrhage, unspecified: Secondary | ICD-10-CM | POA: Diagnosis not present

## 2023-06-27 LAB — BASIC METABOLIC PANEL
Anion gap: 10 (ref 5–15)
BUN: 15 mg/dL (ref 6–20)
CO2: 27 mmol/L (ref 22–32)
Calcium: 9 mg/dL (ref 8.9–10.3)
Chloride: 105 mmol/L (ref 98–111)
Creatinine, Ser: 0.7 mg/dL (ref 0.44–1.00)
GFR, Estimated: >60 mL/min (ref >60)
Glucose, Bld: 145 mg/dL — ABNORMAL HIGH (ref 70–99)
Potassium: 3.9 mmol/L (ref 3.5–5.1)
Sodium: 142 mmol/L (ref 135–145)

## 2023-06-27 LAB — GLUCOSE, CAPILLARY
Glucose-Capillary: 145 mg/dL — ABNORMAL HIGH (ref 70–99)
Glucose-Capillary: 144 mg/dL — ABNORMAL HIGH (ref 70–99)
Glucose-Capillary: 126 mg/dL — ABNORMAL HIGH (ref 70–99)
Glucose-Capillary: 146 mg/dL — ABNORMAL HIGH (ref 70–99)

## 2023-06-27 LAB — MAGNESIUM: Magnesium: 1.9 mg/dL (ref 1.7–2.4)

## 2023-06-27 LAB — PHOSPHORUS: Phosphorus: 3.3 mg/dL (ref 2.5–4.6)

## 2023-06-27 LAB — SURGICAL PATHOLOGY

## 2023-06-27 MED ORDER — ORAL CARE MOUTH RINSE: 15.0000 mL | OROMUCOSAL | Status: DC | PRN

## 2023-06-27 MED ORDER — TRAVASOL 10 % IV SOLN
INTRAVENOUS | Status: AC
  Filled 2023-06-27: qty 902.4

## 2023-06-27 NOTE — Progress Notes (Signed)
Mobility Specialist - Progress Note   06/27/23 1039  Mobility  Activity Ambulated with assistance in hallway  Level of Assistance Independent after set-up  Assistive Device None  Distance Ambulated (ft) 500 ft  Range of Motion/Exercises Active  Activity Response Tolerated well  Mobility Referral Yes  $Mobility charge 1 Mobility  Mobility Specialist Start Time (ACUTE ONLY) 1029  Mobility Specialist Stop Time (ACUTE ONLY) 1037  Mobility Specialist Time Calculation (min) (ACUTE ONLY) 8 min   Pt received in bed and agreed to mobility. Had no issues throughout session, returned to bed with all needs met.  Marilynne Halsted Mobility Specialist

## 2023-06-27 NOTE — Progress Notes (Deleted)
Progress Note     Subjective: Pt denies nausea or vomiting. Did have some small bowel movements yesterday but no flatus. Abdomen still feels distended and she describes pressure in upper abdomen. Discussed with patient that she may ultimately need some sort of surgical intervention but not emergent and still awaiting path from peritoneal biopsy.   Objective: Vital signs in last 24 hours: Temp:  [98.3 F (36.8 C)-98.5 F (36.9 C)] 98.3 F (36.8 C) (09/06 0446) Pulse Rate:  [65-71] 71 (09/06 0446) Resp:  [17-18] 18 (09/06 0446) BP: (132-152)/(61-73) 152/73 (09/06 0446) SpO2:  [99 %-100 %] 100 % (09/06 0446) Last BM Date : 06/18/23  Intake/Output from previous day: 09/05 0701 - 09/06 0700 In: 1054.4 [P.O.:60; I.V.:994.4] Out: 500 [Urine:500] Intake/Output this shift: No intake/output data recorded.  PE: General: pleasant, WD, WN female who is laying in bed in NAD Lungs: Respiratory effort nonlabored Abd: soft, mild ttp in epigastrium, moderately distended Psych: A&Ox3 with an appropriate affect.    Lab Results:  Recent Labs    06/25/23 0143 06/26/23 0451  WBC 12.3* 10.5  HGB 8.5* 7.9*  HCT 29.9* 28.9*  PLT 496* 429*   BMET Recent Labs    06/26/23 0451 06/27/23 0328  NA 141 142  K 4.2 3.9  CL 110 105  CO2 23 27  GLUCOSE 140* 145*  BUN 17 15  CREATININE 0.84 0.70  CALCIUM 9.2 9.0   PT/INR No results for input(s): "LABPROT", "INR" in the last 72 hours.  CMP     Component Value Date/Time   NA 142 06/27/2023 0328   NA 141 05/16/2022 1044   K 3.9 06/27/2023 0328   CL 105 06/27/2023 0328   CO2 27 06/27/2023 0328   GLUCOSE 145 (H) 06/27/2023 0328   BUN 15 06/27/2023 0328   BUN 9 05/16/2022 1044   CREATININE 0.70 06/27/2023 0328   CALCIUM 9.0 06/27/2023 0328   PROT 8.1 06/26/2023 0451   PROT 7.8 05/16/2022 1044   ALBUMIN 3.4 (L) 06/26/2023 0451   ALBUMIN 4.4 05/16/2022 1044   AST 22 06/26/2023 0451   ALT 19 06/26/2023 0451   ALKPHOS 92 06/26/2023  0451   BILITOT 0.7 06/26/2023 0451   BILITOT <0.2 05/16/2022 1044   GFRNONAA >60 06/27/2023 0328   Lipase     Component Value Date/Time   LIPASE 35 06/20/2023 1436       Studies/Results: No results found.  Anti-infectives: Anti-infectives (From admission, onward)    None        Assessment/Plan  Newly identified pelvic mass - Concern for carcinomatosis on imaging, GYN-ONC did endometrial bx which did not show malignancy. Tumor markers with CEA and CA19-9 significantly elevated, CA 125 minimally elevated - more suggestive of possible GI source. Path from peritoneal bx still pending  SBO  - no hx of prior abdominal surgery  - NGT has not been able to be placed but patient is not having active nausea or vomiting  - agree with fluoro guided NGT placement for symptom relief if she develops more nausea or vomiting, may need ativan with placement to try to prevent gagging  - on TPN - surgery will continue to follow, awaiting pathology results   UGI bleeding - GI not planning repeat EGD or colonoscopy, PPI, s/p EGD 05/08/23. Hgb 7.9 yesterday   FEN: NPO, TPN VTE: chemical prophylaxis on hold in setting of GI bleed ID: no current abx  - per [facility] -  IDA T2DM HTN  LOS: 7 days  I reviewed Consultant GYN-ONC and IR notes, hospitalist notes, last 24 h vitals and pain scores, last 48 h intake and output, last 24 h labs and trends, and last 24 h imaging results.   Juliet Rude, [facility] 06/27/2023, 9:45 AM Please see Amion for pager number during day hours 7:00am-4:30pm

## 2023-06-27 NOTE — Progress Notes (Signed)
GYN Oncology Progress Note  Patient alert, oriented, in no acute distress, laying in the bed.  She states she has had a "lazy" day.  No nausea or emesis reported.  Continues to report abdominal discomfort and bloating.  States she had small bowel movement last night and today.  No significant amount of flatus reported.  Discussed with patient the findings of the peritoneal biopsy showing a colon primary.  Advised patient she would be seen by the medical oncologist who specialized in colon cancer.  Patient verbalizes understanding.  No needs or concerns voiced.

## 2023-06-27 NOTE — Plan of Care (Signed)
  Problem: Education: Goal: Knowledge of General Education information will improve Description Including pain rating scale, medication(s)/side effects and non-pharmacologic comfort measures Outcome: Progressing   Problem: Clinical Measurements: Goal: Ability to maintain clinical measurements within normal limits will improve Outcome: Progressing Goal: Will remain free from infection Outcome: Progressing Goal: Diagnostic test results will improve Outcome: Progressing Goal: Respiratory complications will improve Outcome: Progressing Goal: Cardiovascular complication will be avoided Outcome: Progressing   Problem: Pain Managment: Goal: General experience of comfort will improve Outcome: Progressing   

## 2023-06-27 NOTE — Progress Notes (Signed)
   Progress Note     Subjective: Reports that she is feeling a little better today. Endorsing some nausea but no emesis.  BM w/ suppository but not passing flatus and remains somewhat distended.    Objective: Vital signs in last 24 hours: Temp:  [98.3 F (36.8 C)-98.5 F (36.9 C)] 98.3 F (36.8 C) (09/06 0446) Pulse Rate:  [65-71] 71 (09/06 0446) Resp:  [17-18] 18 (09/06 0446) BP: (132-152)/(61-73) 152/73 (09/06 0446) SpO2:  [99 %-100 %] 100 % (09/06 0446) Last BM Date : 06/18/23  Intake/Output from previous day: 09/05 0701 - 09/06 0700 In: 1054.4 [P.O.:60; I.V.:994.4] Out: 500 [Urine:500] Intake/Output this shift: No intake/output data recorded.  PE: General: pleasant, WD, WN female who is laying in bed in NAD Heart: regular, rate, and rhythm.  Lungs: Respiratory effort nonlabored Abd: soft, mild ttp in epigastrium, moderately distended, BS hypoactive Psych: A&Ox3 with an appropriate affect.    Lab Results:  Recent Labs    06/25/23 0143 06/26/23 0451  WBC 12.3* 10.5  HGB 8.5* 7.9*  HCT 29.9* 28.9*  PLT 496* 429*   BMET Recent Labs    06/26/23 0451 06/27/23 0328  NA 141 142  K 4.2 3.9  CL 110 105  CO2 23 27  GLUCOSE 140* 145*  BUN 17 15  CREATININE 0.84 0.70  CALCIUM 9.2 9.0   PT/INR No results for input(s): "LABPROT", "INR" in the last 72 hours.  CMP     Component Value Date/Time   NA 142 06/27/2023 0328   NA 141 05/16/2022 1044   K 3.9 06/27/2023 0328   CL 105 06/27/2023 0328   CO2 27 06/27/2023 0328   GLUCOSE 145 (H) 06/27/2023 0328   BUN 15 06/27/2023 0328   BUN 9 05/16/2022 1044   CREATININE 0.70 06/27/2023 0328   CALCIUM 9.0 06/27/2023 0328   PROT 8.1 06/26/2023 0451   PROT 7.8 05/16/2022 1044   ALBUMIN 3.4 (L) 06/26/2023 0451   ALBUMIN 4.4 05/16/2022 1044   AST 22 06/26/2023 0451   ALT 19 06/26/2023 0451   ALKPHOS 92 06/26/2023 0451   BILITOT 0.7 06/26/2023 0451   BILITOT <0.2 05/16/2022 1044   GFRNONAA >60 06/27/2023 0328    Lipase     Component Value Date/Time   LIPASE 35 06/20/2023 1436       Studies/Results: No results found.  Anti-infectives: Anti-infectives (From admission, onward)    None        Assessment/Plan SBO 2/2 pelvic mass w/ concern for malignancy with carcinomatosis  - No indication for urgent surgical intervention - NG unable to be placed despite attempt in fluoro. Continue with PRN anti-nausea meds - TPN ordered by [facility] - She may require an operation to palliate a malignant obstruction and/or for debulking but we will need to determine the etiology prior to considering intervention  - per [facility] -  IDA T2DM HTN  LOS: 7 days   I reviewed Consultant GYN-ONC and IR notes, hospitalist notes, last 24 h vitals and pain scores, last 48 h intake and output, last 24 h labs and trends, and last 24 h imaging results.   Moise Boring, MD [facility] 06/27/2023, 9:47 AM Please see Amion for pager number during day hours 7:00am-4:30pm

## 2023-06-27 NOTE — Progress Notes (Addendum)
PROGRESS NOTE    Linda [name]  NWG:[id number] DOB: 12-Jul-1966 DOA: 06/20/2023 PCP: Arnette Felts, [name]   Brief Narrative:  Linda [name] is a 57 yo female with PMH anemia, HTN, pre-DM who presented with abdominal pain. CT abdomen/pelvis obtained to rule out possible gallbladder pathology -unfortunately notable for multiple masses in the right lower quadrant superior cervix with dilated loops of bowel concerning for obstruction versus compression. Due to these findings, further workup commenced with consulting surgery, oncology, and gyn-oncology. Tumor markers ordered as well.   Assessment & Plan:   Principal Problem:   Upper GI bleed Active Problems:   Abdominal mass   IDA (iron deficiency anemia)   SBO (small bowel obstruction) (HCC)   Abnormal uterine bleeding   Essential hypertension   Type 2 diabetes mellitus with obesity (HCC)   Thickened endometrium   Metastatic adenocarcinoma, colonic primary, POA Small bowel obstruction, POA - 4.7 x 3 cm mass in RLL and 7.9 x 6.1 cm mass superior to cervix noted on CT. -Concern is GYN malignancy. 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  Other small scattered lesions also appreciated on imaging -Tumor markers abnormal with CA125: 39.9, CA 19-9: 793, CEA: 104.0 -General Surgery following -GynOnc to sign off, refer back to medical oncology given GI is likely origin of tumor - IR completed peritoneal biopsy 9/3 - Metastatic adenocarcinoma consistent with a colonic primary - Core biopsy 9/3 of the endometrium histology benign per discussion with gyn-onc   Acute symptomatic anemia, likely secondary to subacute versus chronic GI bleed -Previously completed H. pylori treatment for presumed gastritis per prior endoscopy -Stools have remained melanotic in appearance but she states she has also been on oral iron daily -Hemoglobin  markedly low on admission, 5.9 g/dL.   -Hemoglobin remained stable after  transfusion, continue to follow -GI consulted at intake  Iron deficiency anemia - Iron 9, sat ratio 2%, TIBC 444. Ferritin 8 - s/p 2 units PRBC so far - also received 2 days of Ferrlecit, but discontinued further doses in setting of suspected malignancy -continue to follow clinically - Recommend repeat iron studies in 3-4 months   Severe protein caloric malnutrition  Small bowel obstruction(See above) -Secondary to above, NG tube unable to be placed by staff or interventional radiology due to intolerance/pain -Continue holding NG tube placement as she appears comfortable -discussed with patient if worsening symptoms would attempt NG placement with increased anxiolytics/analgesics -PICC placed, TPN being titrated up per pharmacy   Type 2 diabetes mellitus with obesity (HCC) - Last A1c 6.5% on 12/05/2022 - SSI ordered in setting of TPN use    Essential hypertension - hold ARB in setting of above - PRN meds to be added; can make scheduled if needed  DVT prophylaxis: SCDs Start: 06/20/23 2059   Code Status:   Code Status: Full Code  Family Communication: None present  Status is: Inpatient  Dispo: The patient is from: Home              Anticipated d/c is to: To be determined              Anticipated d/c date is: To be determined               Patient currently not medically stable for discharge  Consultants:  Gynecology oncology, Med Onc, general surgery, interventional radiology  Procedures:  Biopsy, multiple, as above  Antimicrobials:  None indicated  Subjective: No acute issues or events overnight, denotes small soft  BM overnight/this am but no further flatus/BM noted  Objective: Vitals:   06/26/23 0700 06/26/23 1518 06/26/23 1951 06/27/23 0446  BP:  (!) 144/68 132/61 (!) 152/73  Pulse:  65 65 71  Resp:  18 17 18   Temp:  98.5 F (36.9 C) 98.4 F (36.9 C) 98.3 F (36.8 C)  TempSrc:  Oral Oral   SpO2:  99% 100% 100%  Weight: 92.1 kg     Height:         Intake/Output Summary (Last 24 hours) at 06/27/2023 0809 Last data filed at 06/27/2023 0600 Gross per 24 hour  Intake 1054.4 ml  Output 500 ml  Net 554.4 ml   Filed Weights   06/20/23 2049 06/25/23 0500 06/26/23 0700  Weight: 93.7 kg 91.6 kg 92.1 kg    Examination:  General:  Pleasantly resting in bed, No acute distress. HEENT:  Normocephalic atraumatic.  Sclerae nonicteric, noninjected.  Extraocular movements intact bilaterally. Neck:  Without mass or deformity.  Trachea is midline. Lungs:  Clear to auscultate bilaterally without rhonchi, wheeze, or rales. Heart:  Regular rate and rhythm.  Without murmurs, rubs, or gallops. Abdomen:  Soft, nontender, nondistended.  Without guarding or rebound. Extremities: Without cyanosis, clubbing, edema, or obvious deformity. Skin:  Warm and dry, no erythema.   Data Reviewed: I have personally reviewed following labs and imaging studies  CBC: Recent Labs  Lab 06/22/23 0619 06/23/23 0537 06/24/23 0304 06/25/23 0143 06/26/23 0451  WBC 8.4 12.2* 12.6* 12.3* 10.5  NEUTROABS 6.1 9.4* 10.1* 9.8* 8.1*  HGB 8.1* 8.3* 8.7* 8.5* 7.9*  HCT 28.0* 29.8* 30.4* 29.9* 28.9*  MCV 75.3* 76.0* 77.4* 78.7* 81.0  PLT 501* 534* 556* 496* 429*   Basic Metabolic Panel: Recent Labs  Lab 06/23/23 0537 06/24/23 0304 06/25/23 0143 06/26/23 0451 06/27/23 0328  NA 132* 133* 139 141 142  K 3.4* 3.6 3.5 4.2 3.9  CL 99 101 105 110 105  CO2 23 21* 24 23 27   GLUCOSE 86 131* 137* 140* 145*  BUN 12 29* 27* 17 15  CREATININE 0.91 1.64* 1.19* 0.84 0.70  CALCIUM 9.0 9.2 9.2 9.2 9.0  MG 2.6* 2.9* 2.4 2.0 1.9  PHOS  --  6.1* 3.4 2.6 3.3   GFR: Estimated Creatinine Clearance: 87.4 mL/min (by C-G formula based on SCr of 0.7 mg/dL).  Liver Function Tests: Recent Labs  Lab 06/20/23 1436 06/26/23 0451  AST 13* 22  ALT 6 19  ALKPHOS 78 92  BILITOT 0.6 0.7  PROT 8.7* 8.1  ALBUMIN 4.3 3.4*   Coagulation Profile: Recent Labs  Lab 06/20/23 1436  06/24/23 0942  INR 1.1 1.2   CBG: Recent Labs  Lab 06/26/23 0529 06/26/23 1159 06/26/23 1742 06/26/23 2311 06/27/23 0636  GLUCAP 143* 117* 139* 134* 145*   No results found for this or any previous visit (from the past 240 hour(s)).   Radiology Studies: No results found.  Scheduled Meds:  Chlorhexidine Gluconate Cloth  6 each Topical Daily   insulin aspart  0-9 Units Subcutaneous Q6H   pantoprazole (PROTONIX) IV  40 mg Intravenous Q12H   sodium chloride flush  10-40 mL Intracatheter Q12H   sodium chloride flush  3 mL Intravenous Q12H   thiamine (VITAMIN B1) injection  100 mg Intravenous Daily   Continuous Infusions:  TPN ADULT (ION) 80 mL/hr at 06/26/23 1730   TPN ADULT (ION)       LOS: 7 days   Time spent:  Azucena Fallen, DO [facility]  If 7PM-7AM, please contact night-coverage [email or web address]  06/27/2023, 8:09 AM

## 2023-06-27 NOTE — Progress Notes (Signed)
PHARMACY - TOTAL PARENTERAL NUTRITION CONSULT NOTE   Indication: Small bowel obstruction  Patient Measurements: Height: 5\' 4"  (162.6 cm) Weight: 92.1 kg (203 lb 0.7 oz) IBW/kg (Calculated) : 54.7 TPN AdjBW (KG): 64.5 Body mass index is 34.85 kg/m.   Assessment:  Pharmacy consulted to start TPN on 57 yo female diagnosed with small bowel obstruction.   Recent Labs    06/26/23 0451 06/27/23 0328  NA 141 142  K 4.2 3.9  CL 110 105  CO2 23 27  GLUCOSE 140* 145*  BUN 17 15  CREATININE 0.84 0.70  CALCIUM 9.2 9.0  PHOS 2.6 3.3  MG 2.0 1.9  ALBUMIN 3.4*  --   ALKPHOS 92  --   AST 22  --   ALT 19  --   BILITOT 0.7  --   TRIG 93  --      Glucose / Insulin:  - PMH of pre-DM on semaglutide PTA, A1c 6.5 on 12/05/22 - CBGs in past 24h: 117-145 (goal <150)  - 3 units SSI given in past 24 hr  Electrolytes: all, including Corrected Calcium, now WNL. Renal:  - SCr now improved to WNL. BUN improved to WNL. Hepatic:  - LFTs all within ULN  - Albumin slightly low - Triglycerides WNL Intake / Output; MIVF:  - UOP not recorded - reportedly failed NGT placement x 2  GI Imaging: - 9/1 Distal small-bowel obstruction with transition point in the right lower quadrant, likely associated with a complex mass in the right lower quadrant. Patient may benefit from nasogastric tube decompression and general surgical consultation. Concern for malignancy.  GI Surgeries / Procedures:   Central access: PICC placed for TPN 9/2  TPN start date: 9/2  Nutritional Goals: Goal TPN rate is 80 mL/hr (provides ~ 90 g of protein and  1820 kcals per day)  RD Assessment: Estimated Needs Total Energy Estimated Needs: 1700-1900 Total Protein Estimated Needs: 80-90g Total Fluid Estimated Needs: 1.9L/day  Current Nutrition:  NPO TPN  Plan: Continue TPN at goal rate of 80 mL/hr Electrolytes in TPN:  Na 90 mEq/L  K 50 mEq/L  Ca 82mEq/L Mg 3 mEq/L Phos 7 mmol/L  Cl:Ac 1:2 Add standard MVI and  trace elements to TPN Thiamine 100 mg IV every day x 5 days Continue Sensitive q6h SSI and adjust as needed  Monitor TPN labs on Mon/Thurs and as needed  Elie Goody, PharmD, BCPS Clinical Pharmacist 06/27/2023  6:49 AM

## 2023-06-27 NOTE — Plan of Care (Signed)

## 2023-06-28 DIAGNOSIS — K922 Gastrointestinal hemorrhage, unspecified: Secondary | ICD-10-CM | POA: Diagnosis not present

## 2023-06-28 LAB — GLUCOSE, CAPILLARY: Glucose-Capillary: 145 mg/dL — ABNORMAL HIGH (ref 70–99)

## 2023-06-28 MED ORDER — TRAVASOL 10 % IV SOLN
INTRAVENOUS | Status: AC
  Filled 2023-06-28: qty 902.4

## 2023-06-28 MED ORDER — INFLUENZA VIRUS VACC SPLIT PF (FLUZONE) 0.5 ML IM SUSY
0.5000 mL | PREFILLED_SYRINGE | INTRAMUSCULAR | Status: DC
  Filled 2023-06-28: qty 0.5

## 2023-06-28 NOTE — Plan of Care (Signed)
No acute events overnight. Pt's pain is well-controlled on current regimen. Problem: Education: Goal: Knowledge of General Education information will improve Description: Including pain rating scale, medication(s)/side effects and non-pharmacologic comfort measures Outcome: Progressing   Problem: Health Behavior/Discharge Planning: Goal: Ability to manage health-related needs will improve Outcome: Progressing   Problem: Clinical Measurements: Goal: Ability to maintain clinical measurements within normal limits will improve Outcome: Progressing Goal: Will remain free from infection Outcome: Progressing Goal: Diagnostic test results will improve Outcome: Progressing Goal: Respiratory complications will improve Outcome: Progressing Goal: Cardiovascular complication will be avoided Outcome: Progressing   Problem: Activity: Goal: Risk for activity intolerance will decrease Outcome: Progressing   Problem: Nutrition: Goal: Adequate nutrition will be maintained Outcome: Progressing   Problem: Coping: Goal: Level of anxiety will decrease Outcome: Progressing   Problem: Elimination: Goal: Will not experience complications related to bowel motility Outcome: Progressing Goal: Will not experience complications related to urinary retention Outcome: Progressing   Problem: Pain Managment: Goal: General experience of comfort will improve Outcome: Progressing   Problem: Safety: Goal: Ability to remain free from injury will improve Outcome: Progressing   Problem: Skin Integrity: Goal: Risk for impaired skin integrity will decrease Outcome: Progressing

## 2023-06-28 NOTE — Plan of Care (Signed)
?  Problem: Education: ?Goal: Knowledge of General Education information will improve ?Description: Including pain rating scale, medication(s)/side effects and non-pharmacologic comfort measures ?Outcome: Progressing ?  ?Problem: Health Behavior/Discharge Planning: ?Goal: Ability to manage health-related needs will improve ?Outcome: Progressing ?  ?Problem: Clinical Measurements: ?Goal: Ability to maintain clinical measurements within normal limits will improve ?Outcome: Progressing ?Goal: Will remain free from infection ?Outcome: Progressing ?Goal: Diagnostic test results will improve ?Outcome: Progressing ?Goal: Respiratory complications will improve ?Outcome: Progressing ?Goal: Cardiovascular complication will be avoided ?Outcome: Progressing ?  ?Problem: Coping: ?Goal: Level of anxiety will decrease ?Outcome: Progressing ?  ?Problem: Elimination: ?Goal: Will not experience complications related to bowel motility ?Outcome: Progressing ?  ?Problem: Pain Managment: ?Goal: General experience of comfort will improve ?Outcome: Progressing ?  ?Problem: Safety: ?Goal: Ability to remain free from injury will improve ?Outcome: Progressing ?  ?Problem: Skin Integrity: ?Goal: Risk for impaired skin integrity will decrease ?Outcome: Progressing ?  ?

## 2023-06-28 NOTE — Progress Notes (Signed)
   Subjective/Chief Complaint: No complaints other than some bloating   Objective: Vital signs in last 24 hours: Temp:  [98.7 F (37.1 C)-98.8 F (37.1 C)] 98.7 F (37.1 C) (09/07 0447) Pulse Rate:  [56-65] 61 (09/07 0447) Resp:  [18-20] 20 (09/07 0447) BP: (121-130)/(59-75) 130/75 (09/07 0447) SpO2:  [98 %-100 %] 98 % (09/07 0447) Last BM Date : 06/27/23  Intake/Output from previous day: 09/06 0701 - 09/07 0700 In: 720.4 [I.V.:720.4] Out: -  Intake/Output this shift: No intake/output data recorded.  General appearance: alert and cooperative Resp: clear to auscultation bilaterally Cardio: regular rate and rhythm GI: soft, distended  Lab Results:  Recent Labs    06/26/23 0451  WBC 10.5  HGB 7.9*  HCT 28.9*  PLT 429*   BMET Recent Labs    06/26/23 0451 06/27/23 0328  NA 141 142  K 4.2 3.9  CL 110 105  CO2 23 27  GLUCOSE 140* 145*  BUN 17 15  CREATININE 0.84 0.70  CALCIUM 9.2 9.0   PT/INR No results for input(s): "LABPROT", "INR" in the last 72 hours. ABG No results for input(s): "PHART", "HCO3" in the last 72 hours.  Invalid input(s): "PCO2", "PO2"  Studies/Results: No results found.  Anti-infectives: Anti-infectives (From admission, onward)    None       Assessment/Plan: s/p * No surgery found * Continue bowel rest TPN for nutrition Obstructing pelvic mass appears to be colonic primary with metastatic peritoneal disease Awaiting input from oncology She will likely need surgery early next week for either resection or diversion Newly identified pelvic mass - colonic primary. Concern for carcinomatosis on imaging,  SBO  - no hx of prior abdominal surgery  - NGT was not placed yesterday but no further n/v. Not having bowel function  - agree with fluoro guided NGT placement for symptom relief, may need ativan with placement to try to prevent gagging  - TPN ordered by [facility] - no plans for emergent surgical intervention, await NGT placement    UGI bleeding - GI following, PPI, s/p EGD 05/08/23. Hgb 7.9 this AM, stable. Will need T+C before surgery   FEN: NPO VTE: chemical prophylaxis on hold in setting of GI bleed ID: no current abx   - per [facility] -  IDA T2DM HTN  LOS: 8 days    Chevis Pretty III 06/28/2023

## 2023-06-28 NOTE — Progress Notes (Signed)
PROGRESS NOTE    Linda [name]  BJY:[id number] DOB: [birth year] DOA: 06/20/2023 PCP: Arnette Felts, [name]   Brief Narrative:  Ms. Callicoat is a 57 yo female with PMH anemia, HTN, pre-DM who presented with abdominal pain. CT abdomen/pelvis obtained to rule out possible gallbladder pathology -unfortunately notable for multiple masses in the right lower quadrant superior cervix with dilated loops of bowel concerning for obstruction versus compression. Due to these findings, further workup commenced with consulting surgery, oncology, and gyn-oncology. Tumor markers ordered as well.   Assessment & Plan:   Principal Problem:   Upper GI bleed Active Problems:   Abdominal mass   IDA (iron deficiency anemia)   SBO (small bowel obstruction) (HCC)   Abnormal uterine bleeding   Essential hypertension   Type 2 diabetes mellitus with obesity (HCC)   Thickened endometrium  Metastatic adenocarcinoma, colonic primary, POA Small bowel obstruction, POA Rule out carcinomatosis, POA - 4.7 x 3 cm mass in RLL and 7.9 x 6.1 cm mass superior to cervix noted on CT. -Concern is GYN malignancy. 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  Other small scattered lesions also appreciated on imaging -Tumor markers abnormal with CA125: 39.9, CA 19-9: 793, CEA: 104.0 -General Surgery following - surgery tentatively planned this coming week for possible resection/diversion. -GynOnc to sign off, refer back to medical oncology given GI is likely origin of tumor - IR completed peritoneal biopsy 9/3 - Metastatic adenocarcinoma consistent with a colonic primary - Core biopsy 9/3 of the endometrium histology benign per discussion with gyn-onc   Acute symptomatic anemia, likely secondary to subacute versus chronic GI bleed -Previously completed H. pylori treatment for presumed gastritis per prior endoscopy -Stools have remained melanotic in appearance but she states she has also been on oral  iron daily -Hemoglobin  markedly low on admission, 5.9 g/dL.   -Hemoglobin remained stable after transfusion, continue to follow -GI consulted at intake  Iron deficiency anemia - Iron 9, sat ratio 2%, TIBC 444. Ferritin 8 - s/p 2 units PRBC so far - also received 2 days of Ferrlecit, but discontinued further doses in setting of suspected malignancy -continue to follow clinically - Recommend repeat iron studies in 3-4 months   Severe protein caloric malnutrition  Small bowel obstruction(See above) -Secondary to above, NG tube unable to be placed by staff or interventional radiology due to intolerance/pain -Continue holding NG tube placement as she appears comfortable -discussed with patient if worsening symptoms would attempt NG placement with increased anxiolytics/analgesics -PICC placed, TPN being titrated up per pharmacy   Type 2 diabetes mellitus with obesity (HCC) - Last A1c 6.5% on 12/05/2022 - SSI ordered in setting of TPN use    Essential hypertension - hold ARB in setting of above - PRN meds to be added; can make scheduled if needed  DVT prophylaxis: SCDs Start: 06/20/23 2059   Code Status:   Code Status: Full Code  Family Communication: None present  Status is: Inpatient  Dispo: The patient is from: Home              Anticipated d/c is to: To be determined              Anticipated d/c date is: To be determined               Patient currently not medically stable for discharge  Consultants:  Gynecology oncology, Med Onc, general surgery, interventional radiology  Procedures:  Biopsy, multiple, as above Tentative abdominal resection  vs diversion pending above  Antimicrobials:  None indicated  Subjective: No acute issues or events overnight, no nausea/vomiting/diarrhea/headache/fevers/or chills  Objective: Vitals:   06/27/23 0446 06/27/23 1221 06/27/23 1928 06/28/23 0447  BP: (!) 152/73 (!) 121/59 128/63 130/75  Pulse: 71 (!) 56 65 61  Resp: 18  18 20    Temp: 98.3 F (36.8 C) 98.8 F (37.1 C) 98.7 F (37.1 C) 98.7 F (37.1 C)  TempSrc:  Oral Oral Oral  SpO2: 100% 100% 100% 98%  Weight:      Height:        Intake/Output Summary (Last 24 hours) at 06/28/2023 0744 Last data filed at 06/27/2023 1734 Gross per 24 hour  Intake 720.42 ml  Output --  Net 720.42 ml   Filed Weights   06/20/23 2049 06/25/23 0500 06/26/23 0700  Weight: 93.7 kg 91.6 kg 92.1 kg    Examination:  General:  Pleasantly resting in bed, No acute distress. HEENT:  Normocephalic atraumatic.  Sclerae nonicteric, noninjected.  Extraocular movements intact bilaterally. Neck:  Without mass or deformity.  Trachea is midline. Lungs:  Clear to auscultate bilaterally without rhonchi, wheeze, or rales. Heart:  Regular rate and rhythm.  Without murmurs, rubs, or gallops. Abdomen:  Soft, nontender, nondistended.  Without guarding or rebound. Extremities: Without cyanosis, clubbing, edema, or obvious deformity. RUE PICC line clean/dry/intact. Skin:  Warm and dry, no erythema.   Data Reviewed: I have personally reviewed following labs and imaging studies  CBC: Recent Labs  Lab 06/22/23 0619 06/23/23 0537 06/24/23 0304 06/25/23 0143 06/26/23 0451  WBC 8.4 12.2* 12.6* 12.3* 10.5  NEUTROABS 6.1 9.4* 10.1* 9.8* 8.1*  HGB 8.1* 8.3* 8.7* 8.5* 7.9*  HCT 28.0* 29.8* 30.4* 29.9* 28.9*  MCV 75.3* 76.0* 77.4* 78.7* 81.0  PLT 501* 534* 556* 496* 429*   Basic Metabolic Panel: Recent Labs  Lab 06/23/23 0537 06/24/23 0304 06/25/23 0143 06/26/23 0451 06/27/23 0328  NA 132* 133* 139 141 142  K 3.4* 3.6 3.5 4.2 3.9  CL 99 101 105 110 105  CO2 23 21* 24 23 27   GLUCOSE 86 131* 137* 140* 145*  BUN 12 29* 27* 17 15  CREATININE 0.91 1.64* 1.19* 0.84 0.70  CALCIUM 9.0 9.2 9.2 9.2 9.0  MG 2.6* 2.9* 2.4 2.0 1.9  PHOS  --  6.1* 3.4 2.6 3.3   GFR: Estimated Creatinine Clearance: 87.4 mL/min (by C-G formula based on SCr of 0.7 mg/dL).  Liver Function Tests: Recent Labs   Lab 06/26/23 0451  AST 22  ALT 19  ALKPHOS 92  BILITOT 0.7  PROT 8.1  ALBUMIN 3.4*   Coagulation Profile: Recent Labs  Lab 06/24/23 0942  INR 1.2   CBG: Recent Labs  Lab 06/27/23 0636 06/27/23 1209 06/27/23 1743 06/27/23 2329 06/28/23 0555  GLUCAP 145* 144* 126* 146* 145*   No results found for this or any previous visit (from the past 240 hour(s)).   Radiology Studies: No results found.  Scheduled Meds:  Chlorhexidine Gluconate Cloth  6 each Topical Daily   [START ON 06/29/2023] influenza vac split trivalent PF  0.5 mL Intramuscular Tomorrow-1000   pantoprazole (PROTONIX) IV  40 mg Intravenous Q12H   sodium chloride flush  10-40 mL Intracatheter Q12H   sodium chloride flush  3 mL Intravenous Q12H   thiamine (VITAMIN B1) injection  100 mg Intravenous Daily   Continuous Infusions:  TPN ADULT (ION) 80 mL/hr at 06/27/23 1730     LOS: 8 days   Time spent:  Azucena Fallen, DO [facility]  If 7PM-7AM, please contact night-coverage [email or web address]  06/28/2023, 7:44 AM

## 2023-06-28 NOTE — Progress Notes (Signed)
PHARMACY - TOTAL PARENTERAL NUTRITION CONSULT NOTE   Indication: Small bowel obstruction  Patient Measurements: Height: 5\' 4"  (162.6 cm) Weight: 92.1 kg (203 lb 0.7 oz) IBW/kg (Calculated) : 54.7 TPN AdjBW (KG): 64.5 Body mass index is 34.85 kg/m.   Assessment:  Pharmacy consulted to start TPN on 57 yo female diagnosed with small bowel obstruction.   Recent Labs    06/26/23 0451 06/27/23 0328  NA 141 142  K 4.2 3.9  CL 110 105  CO2 23 27  GLUCOSE 140* 145*  BUN 17 15  CREATININE 0.84 0.70  CALCIUM 9.2 9.0  PHOS 2.6 3.3  MG 2.0 1.9  ALBUMIN 3.4*  --   ALKPHOS 92  --   AST 22  --   ALT 19  --   BILITOT 0.7  --   TRIG 93  --      Glucose / Insulin:  - PMH of pre-DM on semaglutide PTA, A1c 6.5 on 12/05/22 - CBGs in past 24h: 126 - 146 (goal <150)  - 4 units SSI given in past 24 hr Will DC CBGs/SSI  Electrolytes: no labs today; on  9/6 all, including Corrected Calcium,  WNL. Renal:  - SCr WNL. BUN WNL. Hepatic: labs 9/5 - LFTs all within ULN  - Albumin slightly low - Triglycerides WNL Intake / Output; MIVF:  - UOP not recorded - failed NGT placement x 2 GI Imaging: - 9/1 Distal small-bowel obstruction with transition point in the right lower quadrant, likely associated with a complex mass in the right lower quadrant. Patient may benefit from nasogastric tube decompression and general surgical consultation. Concern for malignancy.  GI Surgeries / Procedures:   Central access: PICC placed for TPN 9/2  TPN start date: 9/2  Nutritional Goals: Goal TPN rate is 80 mL/hr (provides ~ 90 g of protein and  1820 kcals per day)  RD Assessment: Estimated Needs Total Energy Estimated Needs: 1700-1900 Total Protein Estimated Needs: 80-90g Total Fluid Estimated Needs: 1.9L/day  Current Nutrition:  NPO TPN  Plan: Continue TPN at goal rate of 80 mL/hr Electrolytes in TPN:  Na 90 mEq/L  K 50 mEq/L  Ca 49mEq/L Mg 3 mEq/L Phos 7 mmol/L  Cl:Ac 1:2 Add standard MVI  and trace elements to TPN Thiamine 100 mg IV every day x 5 days (ends today 9/7)  DC SSI & DC CBGs  Monitor TPN labs on Mon/Thurs and as needed  Herby Abraham, Pharm.D Use secure chat for questions 06/28/2023 7:44 AM

## 2023-06-29 DIAGNOSIS — K922 Gastrointestinal hemorrhage, unspecified: Secondary | ICD-10-CM | POA: Diagnosis not present

## 2023-06-29 MED ORDER — TRAVASOL 10 % IV SOLN
INTRAVENOUS | Status: AC
  Filled 2023-06-29: qty 902.4

## 2023-06-29 NOTE — Progress Notes (Signed)
PHARMACY - TOTAL PARENTERAL NUTRITION CONSULT NOTE   Indication: Small bowel obstruction  Patient Measurements: Height: 5\' 4"  (162.6 cm) Weight: 92.2 kg (203 lb 4.2 oz) IBW/kg (Calculated) : 54.7 TPN AdjBW (KG): 64.5 Body mass index is 34.89 kg/m.   Assessment:  Pharmacy consulted to start TPN on 57 yo female diagnosed with small bowel obstruction.   Recent Labs    06/27/23 0328  NA 142  K 3.9  CL 105  CO2 27  GLUCOSE 145*  BUN 15  CREATININE 0.70  CALCIUM 9.0  PHOS 3.3  MG 1.9     Glucose / Insulin:  - PMH of pre-DM on semaglutide PTA, A1c 6.5 on 12/05/22 - CBGs/SSI dc'd on 9/7 Electrolytes: no labs today; on 9/6 all, including Corrected Calcium,  WNL. Renal:  - SCr WNL. BUN WNL. Hepatic: labs 9/5 - LFTs all within ULN  - Albumin slightly low - Triglycerides WNL Intake / Output; MIVF:  - Urine x 4 - failed NGT placement x 2 GI Imaging: - 9/1 Distal small-bowel obstruction with transition point in the right lower quadrant, likely associated with a complex mass in the right lower quadrant. Patient may benefit from nasogastric tube decompression and general surgical consultation. Concern for malignancy.  GI Surgeries / Procedures:   Central access: PICC placed for TPN 9/2  TPN start date: 9/2  Nutritional Goals: Goal TPN rate is 80 mL/hr (provides ~ 90 g of protein and  1820 kcals per day)  RD Assessment: Estimated Needs Total Energy Estimated Needs: 1700-1900 Total Protein Estimated Needs: 80-90g Total Fluid Estimated Needs: 1.9L/day  Current Nutrition:  NPO TPN  Plan: Continue TPN at goal rate of 80 mL/hr Electrolytes in TPN:  Na 90 mEq/L  K 50 mEq/L  Ca 64mEq/L Mg 3 mEq/L Phos 7 mmol/L  Cl:Ac 1:2 Add standard MVI and trace elements to TPN Thiamine 100 mg IV every day x 5 days completed 9/7 SSI & CBGs dc'd on 9/7 Monitor TPN labs on Mon/Thurs and as needed  Herby Abraham, Pharm.D Use secure chat for questions 06/29/2023 9:14 AM

## 2023-06-29 NOTE — Progress Notes (Signed)
Mobility Specialist - Progress Note   06/29/23 1146  Mobility  Activity Ambulated with assistance in hallway  Level of Assistance Modified independent, requires aide device or extra time  Assistive Device None  Distance Ambulated (ft) 500 ft  Range of Motion/Exercises Active  Activity Response Tolerated well  Mobility Referral Yes  $Mobility charge 1 Mobility  Mobility Specialist Start Time (ACUTE ONLY) 1134  Mobility Specialist Stop Time (ACUTE ONLY) 1145  Mobility Specialist Time Calculation (min) (ACUTE ONLY) 11 min   Pt received in bed and agreed to mobility, had no issues throughout session, returned to bed with all needs met.  Marilynne Halsted Mobility Specialist

## 2023-06-29 NOTE — Progress Notes (Signed)
   Subjective/Chief Complaint: No complaints   Objective: Vital signs in last 24 hours: Temp:  [98.3 F (36.8 C)-98.7 F (37.1 C)] 98.3 F (36.8 C) (09/08 0536) Pulse Rate:  [59-63] 63 (09/08 0536) Resp:  [18-19] 19 (09/08 0536) BP: (123-150)/(66-76) 123/66 (09/08 0536) SpO2:  [97 %-100 %] 97 % (09/08 0536) Weight:  [92.2 kg] 92.2 kg (09/08 0500) Last BM Date : 06/27/23  Intake/Output from previous day: 09/07 0701 - 09/08 0700 In: 2908.9 [I.V.:2908.9] Out: -  Intake/Output this shift: No intake/output data recorded.  General appearance: alert and cooperative Resp: clear to auscultation bilaterally Cardio: regular rate and rhythm GI: soft, distended. nontender  Lab Results:  No results for input(s): "WBC", "HGB", "HCT", "PLT" in the last 72 hours. BMET Recent Labs    06/27/23 0328  NA 142  K 3.9  CL 105  CO2 27  GLUCOSE 145*  BUN 15  CREATININE 0.70  CALCIUM 9.0   PT/INR No results for input(s): "LABPROT", "INR" in the last 72 hours. ABG No results for input(s): "PHART", "HCO3" in the last 72 hours.  Invalid input(s): "PCO2", "PO2"  Studies/Results: No results found.  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 Concern for carcinomatosis on imaging,  SBO  - no hx of prior abdominal surgery  - NGT was not placed yesterday but no further n/v. Not having bowel function  - agree with fluoro guided NGT placement for symptom relief, may need ativan with placement to try to prevent gagging  - TPN ordered by [facility] - no plans for emergent surgical intervention, await NGT placement   UGI bleeding - GI  following, PPI, s/p EGD 05/08/23. Hgb 7.9 this AM, stable. Will need T+C before surgery   FEN: NPO VTE: chemical prophylaxis on hold in setting of GI bleed ID: no current abx   - per [facility] -  IDA T2DM HTN  LOS: 9 days    Chevis Pretty III 06/29/2023

## 2023-06-29 NOTE — Progress Notes (Signed)
PROGRESS NOTE    Linda [name]  LKG:[id number] DOB: [birth year] DOA: 06/20/2023 PCP: Arnette Felts, [name]   Brief Narrative:  Linda [name] is a 57 yo female with PMH anemia, HTN, pre-DM who presented with abdominal pain. CT abdomen/pelvis obtained to rule out possible gallbladder pathology -unfortunately notable for multiple masses in the right lower quadrant superior cervix with dilated loops of bowel concerning for obstruction versus compression. Due to these findings, further workup commenced with consulting surgery, oncology, and gyn-oncology. Tumor markers ordered as well.   Assessment & Plan:   Principal Problem:   Upper GI bleed Active Problems:   Abdominal mass   IDA (iron deficiency anemia)   SBO (small bowel obstruction) (HCC)   Abnormal uterine bleeding   Essential hypertension   Type 2 diabetes mellitus with obesity (HCC)   Thickened endometrium  Metastatic adenocarcinoma, colonic primary, POA Small bowel obstruction, POA Rule out carcinomatosis, POA - 4.7 x 3 cm mass in RLL and 7.9 x 6.1 cm mass superior to cervix noted on CT. -Concern is GYN malignancy. 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  Other small scattered lesions also appreciated on imaging -Tumor markers abnormal with CA125: 39.9, CA 19-9: 793, CEA: 104.0 -General Surgery following - surgery tentatively planned this coming week for possible resection/diversion. -GynOnc to sign off, refer back to medical oncology given GI is likely origin of tumor - IR completed peritoneal biopsy 9/3 - Metastatic adenocarcinoma consistent with a colonic primary - Unremarkable core biopsy 9/3 of the endometrium    Acute symptomatic anemia, likely secondary to subacute versus chronic GI bleed -Previously completed H. pylori treatment for presumed gastritis per prior endoscopy with ongoing melanotic stools -Hemoglobin 5.9 g/dL at intake - stabilizing- follow labs BIW, otherwise prn.  Iron  deficiency anemia - Iron 9, sat ratio 2%, TIBC 444. Ferritin 8 - s/p 2 units PRBC so far - also received 2 days of Ferrlecit, but discontinued further doses in setting of suspected malignancy -continue to follow clinically - Recommend repeat iron studies in 3-4 months   Severe protein caloric malnutrition  Small bowel obstruction(See above) -Secondary to above, NG tube unable to be placed by staff or interventional radiology due to intolerance/pain -Continue holding NG tube placement as she appears comfortable -discussed with patient if worsening symptoms would attempt NG placement with increased anxiolytics/analgesics -PICC placed, TPN being titrated up per pharmacy   Type 2 diabetes mellitus with obesity (HCC) - Last A1c 6.5% on 12/05/2022 - SSI ordered in setting of TPN use    Essential hypertension - hold ARB in setting of above - PRN meds to be added; can make scheduled if needed  DVT prophylaxis: SCDs Start: 06/20/23 2059 Code Status:   Code Status: Full Code Family Communication: None present  Status is: Inpatient Dispo: The patient is from: Home              Anticipated d/c is to: To be determined              Anticipated d/c date is: To be determined               Patient currently not medically stable for discharge  Consultants:  Gynecology oncology, Med Onc, general surgery, interventional radiology  Procedures:  Biopsy, multiple, as above Tentative abdominal resection vs diversion pending above  Antimicrobials:  None indicated  Subjective: No acute issues or events overnight, no episodes of nausea vomiting diarrhea headache fevers or chills  Objective: Vitals:  06/28/23 1219 06/28/23 2002 06/29/23 0500 06/29/23 0536  BP: (!) 150/76 (!) 149/67  123/66  Pulse: (!) 59 (!) 59  63  Resp: 18 19  19   Temp: 98.7 F (37.1 C) 98.7 F (37.1 C)  98.3 F (36.8 C)  TempSrc: Oral Oral  Oral  SpO2: 100% 100%  97%  Weight:   92.2 kg   Height:        Intake/Output  Summary (Last 24 hours) at 06/29/2023 0731 Last data filed at 06/29/2023 0600 Gross per 24 hour  Intake 2908.93 ml  Output --  Net 2908.93 ml   Filed Weights   06/25/23 0500 06/26/23 0700 06/29/23 0500  Weight: 91.6 kg 92.1 kg 92.2 kg    Examination:  General:  Pleasantly resting in bed, No acute distress. HEENT:  Normocephalic atraumatic.  Sclerae nonicteric, noninjected.  Extraocular movements intact bilaterally. Neck:  Without mass or deformity.  Trachea is midline. Lungs:  Clear to auscultate bilaterally without rhonchi, wheeze, or rales. Heart:  Regular rate and rhythm.  Without murmurs, rubs, or gallops. Abdomen:  Soft, nontender, nondistended.  Without guarding or rebound. Extremities: Without cyanosis, clubbing, edema, or obvious deformity. RUE PICC line clean/dry/intact. Skin:  Warm and dry, no erythema.   Data Reviewed: I have personally reviewed following labs and imaging studies  CBC: Recent Labs  Lab 06/23/23 0537 06/24/23 0304 06/25/23 0143 06/26/23 0451  WBC 12.2* 12.6* 12.3* 10.5  NEUTROABS 9.4* 10.1* 9.8* 8.1*  HGB 8.3* 8.7* 8.5* 7.9*  HCT 29.8* 30.4* 29.9* 28.9*  MCV 76.0* 77.4* 78.7* 81.0  PLT 534* 556* 496* 429*   Basic Metabolic Panel: Recent Labs  Lab 06/23/23 0537 06/24/23 0304 06/25/23 0143 06/26/23 0451 06/27/23 0328  NA 132* 133* 139 141 142  K 3.4* 3.6 3.5 4.2 3.9  CL 99 101 105 110 105  CO2 23 21* 24 23 27   GLUCOSE 86 131* 137* 140* 145*  BUN 12 29* 27* 17 15  CREATININE 0.91 1.64* 1.19* 0.84 0.70  CALCIUM 9.0 9.2 9.2 9.2 9.0  MG 2.6* 2.9* 2.4 2.0 1.9  PHOS  --  6.1* 3.4 2.6 3.3   GFR: Estimated Creatinine Clearance: 87.4 mL/min (by C-G formula based on SCr of 0.7 mg/dL).  Liver Function Tests: Recent Labs  Lab 06/26/23 0451  AST 22  ALT 19  ALKPHOS 92  BILITOT 0.7  PROT 8.1  ALBUMIN 3.4*   Coagulation Profile: Recent Labs  Lab 06/24/23 0942  INR 1.2   CBG: Recent Labs  Lab 06/27/23 0636 06/27/23 1209  06/27/23 1743 06/27/23 2329 06/28/23 0555  GLUCAP 145* 144* 126* 146* 145*   No results found for this or any previous visit (from the past 240 hour(s)).   Radiology Studies: No results found.  Scheduled Meds:  Chlorhexidine Gluconate Cloth  6 each Topical Daily   influenza vac split trivalent PF  0.5 mL Intramuscular Tomorrow-1000   pantoprazole (PROTONIX) IV  40 mg Intravenous Q12H   sodium chloride flush  10-40 mL Intracatheter Q12H   sodium chloride flush  3 mL Intravenous Q12H   Continuous Infusions:  TPN ADULT (ION) 80 mL/hr at 06/28/23 1827     LOS: 9 days   Time spent:  Azucena Fallen, DO [facility]  If 7PM-7AM, please contact night-coverage [email or web address]  06/29/2023, 7:31 AM

## 2023-06-29 NOTE — Plan of Care (Signed)
  Problem: Education: Goal: Knowledge of General Education information will improve Description: Including pain rating scale, medication(s)/side effects and non-pharmacologic comfort measures Outcome: Progressing   Problem: Clinical Measurements: Goal: Ability to maintain clinical measurements within normal limits will improve Outcome: Progressing Goal: Will remain free from infection Outcome: Progressing Goal: Diagnostic test results will improve Outcome: Progressing Goal: Respiratory complications will improve Outcome: Progressing   Problem: Coping: Goal: Level of anxiety will decrease Outcome: Progressing   Problem: Elimination: Goal: Will not experience complications related to urinary retention Outcome: Progressing   Problem: Pain Managment: Goal: General experience of comfort will improve Outcome: Progressing

## 2023-06-30 ENCOUNTER — Inpatient Hospital Stay (HOSPITAL_COMMUNITY): Payer: BC Managed Care – PPO | Admitting: Anesthesiology

## 2023-06-30 ENCOUNTER — Encounter (HOSPITAL_COMMUNITY)
Admission: EM | Disposition: A | Discharge status: Home / Self Care | Payer: Self-pay | Source: Home / Self Care | Attending: Internal Medicine

## 2023-06-30 ENCOUNTER — Other Ambulatory Visit: Payer: Self-pay

## 2023-06-30 DIAGNOSIS — E119 Type 2 diabetes mellitus without complications: Secondary | ICD-10-CM | POA: Diagnosis not present

## 2023-06-30 DIAGNOSIS — K922 Gastrointestinal hemorrhage, unspecified: Secondary | ICD-10-CM | POA: Diagnosis not present

## 2023-06-30 DIAGNOSIS — C179 Malignant neoplasm of small intestine, unspecified: Secondary | ICD-10-CM | POA: Diagnosis not present

## 2023-06-30 DIAGNOSIS — I10 Essential (primary) hypertension: Secondary | ICD-10-CM | POA: Diagnosis not present

## 2023-06-30 DIAGNOSIS — E669 Obesity, unspecified: Secondary | ICD-10-CM | POA: Diagnosis not present

## 2023-06-30 HISTORY — PX: LAPAROTOMY: SHX154

## 2023-06-30 LAB — POCT I-STAT EG7
Acid-base deficit: 8 mmol/L — ABNORMAL HIGH (ref 0.0–2.0)
Bicarbonate: 18.6 mmol/L — ABNORMAL LOW (ref 20.0–28.0)
Calcium, Ion: 0.78 mmol/L — CL (ref 1.15–1.40)
HCT: 25 % — ABNORMAL LOW (ref 36.0–46.0)
Hemoglobin: 8.5 g/dL — ABNORMAL LOW (ref 12.0–15.0)
O2 Saturation: 84 %
Potassium: 3.3 mmol/L — ABNORMAL LOW (ref 3.5–5.1)
Sodium: 110 mmol/L — CL (ref 135–145)
TCO2: 20 mmol/L — ABNORMAL LOW (ref 22–32)
pCO2, Ven: 39.6 mmHg — ABNORMAL LOW (ref 44–60)
pH, Ven: 7.281 (ref 7.25–7.43)
pO2, Ven: 54 mmHg — ABNORMAL HIGH (ref 32–45)

## 2023-06-30 LAB — COMPREHENSIVE METABOLIC PANEL
ALT: 13 U/L (ref 0–44)
AST: 14 U/L — ABNORMAL LOW (ref 15–41)
Albumin: 3.1 g/dL — ABNORMAL LOW (ref 3.5–5.0)
Alkaline Phosphatase: 78 U/L (ref 38–126)
Anion gap: 7 (ref 5–15)
BUN: 14 mg/dL (ref 6–20)
CO2: 26 mmol/L (ref 22–32)
Calcium: 8.5 mg/dL — ABNORMAL LOW (ref 8.9–10.3)
Chloride: 101 mmol/L (ref 98–111)
Creatinine, Ser: 0.68 mg/dL (ref 0.44–1.00)
GFR, Estimated: >60 mL/min (ref >60)
Glucose, Bld: 151 mg/dL — ABNORMAL HIGH (ref 70–99)
Potassium: 3.9 mmol/L (ref 3.5–5.1)
Sodium: 134 mmol/L — ABNORMAL LOW (ref 135–145)
Total Bilirubin: 0.5 mg/dL (ref 0.3–1.2)
Total Protein: 7.2 g/dL (ref 6.5–8.1)

## 2023-06-30 LAB — CBC
HCT: 26.7 % — ABNORMAL LOW (ref 36.0–46.0)
Hemoglobin: 7.6 g/dL — ABNORMAL LOW (ref 12.0–15.0)
MCH: 22.7 pg — ABNORMAL LOW (ref 26.0–34.0)
MCHC: 28.5 g/dL — ABNORMAL LOW (ref 30.0–36.0)
MCV: 79.7 fL — ABNORMAL LOW (ref 80.0–100.0)
Platelets: 361 10*3/uL (ref 150–400)
RBC: 3.35 MIL/uL — ABNORMAL LOW (ref 3.87–5.11)
RDW: 27.3 % — ABNORMAL HIGH (ref 11.5–15.5)
WBC: 10.4 10*3/uL (ref 4.0–10.5)
nRBC: 0 % (ref 0.0–0.2)

## 2023-06-30 LAB — PHOSPHORUS: Phosphorus: 3.9 mg/dL (ref 2.5–4.6)

## 2023-06-30 LAB — GLUCOSE, CAPILLARY
Glucose-Capillary: 96 mg/dL (ref 70–99)
Glucose-Capillary: 190 mg/dL — ABNORMAL HIGH (ref 70–99)

## 2023-06-30 LAB — PREPARE RBC (CROSSMATCH)

## 2023-06-30 LAB — MAGNESIUM: Magnesium: 2.1 mg/dL (ref 1.7–2.4)

## 2023-06-30 LAB — TRIGLYCERIDES: Triglycerides: 153 mg/dL — ABNORMAL HIGH (ref <150)

## 2023-06-30 SURGERY — LAPAROTOMY, EXPLORATORY
Anesthesia: General

## 2023-06-30 MED ORDER — ONDANSETRON HCL 4 MG/2ML IJ SOLN
INTRAMUSCULAR | Status: AC
  Filled 2023-06-30: qty 2

## 2023-06-30 MED ORDER — ONDANSETRON HCL 4 MG/2ML IJ SOLN
INTRAMUSCULAR | Status: DC | PRN
  Administered 2023-06-30: 4 mg via INTRAVENOUS

## 2023-06-30 MED ORDER — DEXAMETHASONE SODIUM PHOSPHATE 10 MG/ML IJ SOLN
INTRAMUSCULAR | Status: AC
  Filled 2023-06-30: qty 2

## 2023-06-30 MED ORDER — PHENYLEPHRINE HCL (PRESSORS) 10 MG/ML IV SOLN
INTRAVENOUS | Status: DC | PRN
Start: 2023-06-30 — End: 2023-06-30
  Administered 2023-06-30: 40 ug via INTRAVENOUS
  Administered 2023-06-30: 80 ug via INTRAVENOUS
  Administered 2023-06-30: 40 ug via INTRAVENOUS

## 2023-06-30 MED ORDER — ROCURONIUM BROMIDE 100 MG/10ML IV SOLN
INTRAVENOUS | Status: DC | PRN
  Administered 2023-06-30: 70 mg via INTRAVENOUS
  Administered 2023-06-30: 20 mg via INTRAVENOUS

## 2023-06-30 MED ORDER — 0.9 % SODIUM CHLORIDE (POUR BTL) OPTIME
TOPICAL | Status: DC | PRN
Start: 2023-06-30 — End: 2023-06-30
  Administered 2023-06-30 (×2): 1000 mL

## 2023-06-30 MED ORDER — FENTANYL CITRATE (PF) 250 MCG/5ML IJ SOLN
INTRAMUSCULAR | Status: AC
  Filled 2023-06-30: qty 5

## 2023-06-30 MED ORDER — TRAVASOL 10 % IV SOLN
INTRAVENOUS | Status: AC
  Filled 2023-06-30: qty 902.4

## 2023-06-30 MED ORDER — OXYCODONE HCL 5 MG/5ML PO SOLN: 5.0000 mg | Freq: Once | ORAL | Status: DC | PRN

## 2023-06-30 MED ORDER — PROPOFOL 10 MG/ML IV BOLUS
INTRAVENOUS | Status: DC | PRN
  Administered 2023-06-30: 200 mg via INTRAVENOUS

## 2023-06-30 MED ORDER — ORAL CARE MOUTH RINSE: 15.0000 mL | Freq: Once | OROMUCOSAL | Status: AC

## 2023-06-30 MED ORDER — ONDANSETRON HCL 4 MG/2ML IJ SOLN
INTRAMUSCULAR | Status: AC
  Filled 2023-06-30: qty 4

## 2023-06-30 MED ORDER — HYDROMORPHONE HCL 1 MG/ML IJ SOLN
0.2500 mg | INTRAMUSCULAR | Status: DC | PRN
  Administered 2023-06-30: 0.25 mg via INTRAVENOUS
  Administered 2023-06-30: 0.5 mg via INTRAVENOUS
  Administered 2023-06-30: 0.25 mg via INTRAVENOUS
  Administered 2023-06-30: 0.5 mg via INTRAVENOUS

## 2023-06-30 MED ORDER — ONDANSETRON HCL 4 MG/2ML IJ SOLN
4.0000 mg | Freq: Once | INTRAMUSCULAR | Status: AC | PRN
  Administered 2023-06-30: 4 mg via INTRAVENOUS

## 2023-06-30 MED ORDER — FENTANYL CITRATE (PF) 100 MCG/2ML IJ SOLN
INTRAMUSCULAR | Status: DC | PRN
  Administered 2023-06-30 (×3): 50 ug via INTRAVENOUS
  Administered 2023-06-30: 100 ug via INTRAVENOUS

## 2023-06-30 MED ORDER — ACETAMINOPHEN 10 MG/ML IV SOLN
INTRAVENOUS | Status: DC | PRN
  Administered 2023-06-30: 1000 mg via INTRAVENOUS

## 2023-06-30 MED ORDER — ACETAMINOPHEN 10 MG/ML IV SOLN
INTRAVENOUS | Status: AC
  Filled 2023-06-30: qty 100

## 2023-06-30 MED ORDER — SODIUM CHLORIDE 0.9% IV SOLUTION: Freq: Once | INTRAVENOUS | Status: DC

## 2023-06-30 MED ORDER — LIDOCAINE HCL (CARDIAC) PF 100 MG/5ML IV SOSY
PREFILLED_SYRINGE | INTRAVENOUS | Status: DC | PRN
  Administered 2023-06-30: 60 mg via INTRAVENOUS

## 2023-06-30 MED ORDER — KETAMINE HCL 50 MG/5ML IJ SOSY
PREFILLED_SYRINGE | INTRAMUSCULAR | Status: AC
  Filled 2023-06-30: qty 5

## 2023-06-30 MED ORDER — ALBUMIN HUMAN 5 % IV SOLN: INTRAVENOUS | Status: DC | PRN

## 2023-06-30 MED ORDER — KETAMINE HCL 10 MG/ML IJ SOLN
INTRAMUSCULAR | Status: DC | PRN
  Administered 2023-06-30: 30 mg via INTRAVENOUS
  Administered 2023-06-30: 10 mg via INTRAVENOUS

## 2023-06-30 MED ORDER — DEXAMETHASONE SODIUM PHOSPHATE 4 MG/ML IJ SOLN
INTRAMUSCULAR | Status: DC | PRN
Start: 2023-06-30 — End: 2023-06-30
  Administered 2023-06-30: 4 mg via INTRAVENOUS

## 2023-06-30 MED ORDER — LACTATED RINGERS IV SOLN: INTRAVENOUS | Status: DC

## 2023-06-30 MED ORDER — LACTATED RINGERS IV SOLN: INTRAVENOUS | Status: DC | PRN

## 2023-06-30 MED ORDER — CHLORHEXIDINE GLUCONATE 0.12 % MT SOLN
15.0000 mL | Freq: Once | OROMUCOSAL | Status: AC
  Administered 2023-06-30: 15 mL via OROMUCOSAL

## 2023-06-30 MED ORDER — HYDROMORPHONE HCL 1 MG/ML IJ SOLN
INTRAMUSCULAR | Status: AC
  Administered 2023-06-30: 0.5 mg via INTRAVENOUS
  Filled 2023-06-30: qty 2

## 2023-06-30 MED ORDER — SUGAMMADEX SODIUM 200 MG/2ML IV SOLN
INTRAVENOUS | Status: DC | PRN
Start: 2023-06-30 — End: 2023-06-30
  Administered 2023-06-30: 360 mg via INTRAVENOUS

## 2023-06-30 MED ORDER — MIDAZOLAM HCL 2 MG/2ML IJ SOLN
INTRAMUSCULAR | Status: AC
  Filled 2023-06-30: qty 2

## 2023-06-30 MED ORDER — AMISULPRIDE (ANTIEMETIC) 5 MG/2ML IV SOLN: 10.0000 mg | Freq: Once | INTRAVENOUS | Status: DC | PRN

## 2023-06-30 MED ORDER — CEFAZOLIN SODIUM-DEXTROSE 2-4 GM/100ML-% IV SOLN
2.0000 g | INTRAVENOUS | Status: AC
  Administered 2023-06-30: 2 g via INTRAVENOUS
  Filled 2023-06-30: qty 100

## 2023-06-30 MED ORDER — OXYCODONE HCL 5 MG PO TABS: 5.0000 mg | ORAL_TABLET | Freq: Once | ORAL | Status: DC | PRN

## 2023-06-30 MED ORDER — PROPOFOL 10 MG/ML IV BOLUS
INTRAVENOUS | Status: AC
  Filled 2023-06-30: qty 20

## 2023-06-30 MED ORDER — MIDAZOLAM HCL 5 MG/5ML IJ SOLN
INTRAMUSCULAR | Status: DC | PRN
  Administered 2023-06-30: 1 mg via INTRAVENOUS

## 2023-06-30 SURGICAL SUPPLY — 50 items
APL PRP STRL LF DISP 70% ISPRP (MISCELLANEOUS)
BAG COUNTER SPONGE SURGICOUNT (BAG) IMPLANT
BAG SPNG CNTER NS LX DISP (BAG)
CHLORAPREP W/TINT 26 (MISCELLANEOUS) IMPLANT
COVER MAYO STAND STRL (DRAPES) ×1 IMPLANT
COVER SURGICAL LIGHT HANDLE (MISCELLANEOUS) ×1 IMPLANT
DRAIN CHANNEL 19F RND (DRAIN) IMPLANT
DRAIN RELI 100 BL SUC LF ST (DRAIN)
DRAPE LAPAROSCOPIC ABDOMINAL (DRAPES) ×1 IMPLANT
DRSG OPSITE POSTOP 4X10 (GAUZE/BANDAGES/DRESSINGS) IMPLANT
DRSG OPSITE POSTOP 4X12 (GAUZE/BANDAGES/DRESSINGS) IMPLANT
DRSG OPSITE POSTOP 4X6 (GAUZE/BANDAGES/DRESSINGS) IMPLANT
DRSG OPSITE POSTOP 4X8 (GAUZE/BANDAGES/DRESSINGS) IMPLANT
ELECT REM PT RETURN 15FT ADLT (MISCELLANEOUS) ×1 IMPLANT
EVACUATOR SILICONE 100CC (DRAIN) IMPLANT
GLOVE BIO SURGEON STRL SZ 6 (GLOVE) ×2 IMPLANT
GLOVE INDICATOR 6.5 STRL GRN (GLOVE) ×2 IMPLANT
GLOVE SS BIOGEL STRL SZ 6 (GLOVE) ×1 IMPLANT
GOWN STRL REUS W/ TWL LRG LVL3 (GOWN DISPOSABLE) ×1 IMPLANT
GOWN STRL REUS W/ TWL XL LVL3 (GOWN DISPOSABLE) IMPLANT
GOWN STRL REUS W/TWL LRG LVL3 (GOWN DISPOSABLE) ×1
GOWN STRL REUS W/TWL XL LVL3 (GOWN DISPOSABLE)
KIT TURNOVER KIT A (KITS) IMPLANT
LIGASURE IMPACT 36 18CM CVD LR (INSTRUMENTS) IMPLANT
PACK GENERAL/GYN (CUSTOM PROCEDURE TRAY) ×1 IMPLANT
RELOAD PROXIMATE 75MM BLUE (ENDOMECHANICALS) ×2 IMPLANT
RELOAD STAPLE 75 3.8 BLU REG (ENDOMECHANICALS) IMPLANT
RETRACTOR WOUND ALXS 34CM XLRG (MISCELLANEOUS) IMPLANT
RTRCTR WOUND ALEXIS 34CM XLRG (MISCELLANEOUS) ×1
STAPLER 90 3.5 STAND SLIM (STAPLE) ×1
STAPLER 90 3.5 STD SLIM (STAPLE) IMPLANT
STAPLER PROXIMATE 75MM BLUE (STAPLE) IMPLANT
STAPLER VISISTAT 35W (STAPLE) IMPLANT
SUT ETHILON 3 0 PS 1 (SUTURE) IMPLANT
SUT MNCRL AB 4-0 PS2 18 (SUTURE) IMPLANT
SUT NOVA T20/GS 25 (SUTURE) IMPLANT
SUT PDS AB 1 CT1 27 (SUTURE) IMPLANT
SUT PDS AB 1 TP1 96 (SUTURE) IMPLANT
SUT SILK 2 0 (SUTURE) ×1
SUT SILK 2 0 SH CR/8 (SUTURE) ×1 IMPLANT
SUT SILK 2-0 18XBRD TIE 12 (SUTURE) ×1 IMPLANT
SUT SILK 3 0 (SUTURE) ×1
SUT SILK 3 0 SH CR/8 (SUTURE) ×1 IMPLANT
SUT SILK 3-0 18XBRD TIE 12 (SUTURE) ×1 IMPLANT
SUT VIC AB 2-0 CT1 27 (SUTURE) ×5
SUT VIC AB 2-0 CT1 TAPERPNT 27 (SUTURE) IMPLANT
TOWEL OR 17X26 10 PK STRL BLUE (TOWEL DISPOSABLE) IMPLANT
TOWEL OR NON WOVEN STRL DISP B (DISPOSABLE) ×1 IMPLANT
TRAY FOLEY MTR SLVR 14FR STAT (SET/KITS/TRAYS/PACK) ×1 IMPLANT
TRAY FOLEY MTR SLVR 16FR STAT (SET/KITS/TRAYS/PACK) ×1 IMPLANT

## 2023-06-30 NOTE — Progress Notes (Addendum)
PHARMACY - TOTAL PARENTERAL NUTRITION CONSULT NOTE   Indication: Small bowel obstruction  Patient Measurements: Height: 5\' 4"  (162.6 cm) Weight: 92.8 kg (204 lb 9.4 oz) IBW/kg (Calculated) : 54.7 TPN AdjBW (KG): 64.5 Body mass index is 35.12 kg/m.  Assessment:  Pharmacy consulted to start TPN on 57 yo female diagnosed with small bowel obstruction.   Recent Labs    06/30/23 0322  NA 134*  K 3.9  CL 101  CO2 26  GLUCOSE 151*  BUN 14  CREATININE 0.68  CALCIUM 8.5*  PHOS 3.9  MG 2.1  ALBUMIN 3.1*  ALKPHOS 78  AST 14*  ALT 13  BILITOT 0.5  TRIG 153*     Glucose / Insulin:  - PMH of pre-DM on semaglutide PTA, A1c 6.5 on 12/05/22 - CBGs/SSI dc'd on 9/7 Electrolytes:  - Na 134 (slightly low) - Corrected Ca 9.2 (stable) - All other electrolytes WNL Renal:  - SCr WNL. BUN WNL. Hepatic: labs 9/5 - LFTs all within WNL - Albumin 3.1 (slightly low) - Triglycerides 153 (slightly elevated) Intake / Output; MIVF:  - Urine x 2 - failed NGT placement x 2 GI Imaging: - 9/1 Distal small-bowel obstruction with transition point in the right lower quadrant, likely associated with a complex mass in the right lower quadrant. Patient may benefit from nasogastric tube decompression and general surgical consultation. Concern for malignancy.  GI Surgeries / Procedures:  - 9/9 exploratory laparotomy with possible bowel obstruction resection/G-tube placement  Central access: PICC placed for TPN 9/2  TPN start date: 9/2  Nutritional Goals: Goal TPN rate is 80 mL/hr (provides ~ 90 g of protein and  1820 kcals per day)  RD Assessment: Estimated Needs Total Energy Estimated Needs: 1700-1900 Total Protein Estimated Needs: 80-90g Total Fluid Estimated Needs: 1.9L/day  Current Nutrition:  NPO TPN  Plan: Continue TPN at goal rate of 80 mL/hr Electrolytes in TPN:  Na 100 mEq/L (increased 9/9) K 50 mEq/L  Ca 77mEq/L Mg 3 mEq/L Phos 7 mmol/L  Cl:Ac 1:2 Add standard MVI and trace  elements to TPN Thiamine 100 mg IV every day x 5 days completed 9/7 SSI & CBGs dc'd on 9/7 Monitor TPN labs on Mon/Thurs and as needed   Cherylin Mylar, PharmD Clinical Pharmacist  9/9/20247:46 AM

## 2023-06-30 NOTE — Plan of Care (Signed)
  Problem: Education: Goal: Knowledge of General Education information will improve Description Including pain rating scale, medication(s)/side effects and non-pharmacologic comfort measures Outcome: Progressing   Problem: Health Behavior/Discharge Planning: Goal: Ability to manage health-related needs will improve Outcome: Progressing   Problem: Clinical Measurements: Goal: Ability to maintain clinical measurements within normal limits will improve Outcome: Progressing Goal: Cardiovascular complication will be avoided Outcome: Progressing   Problem: Pain Managment: Goal: General experience of comfort will improve Outcome: Progressing   Problem: Safety: Goal: Ability to remain free from injury will improve Outcome: Progressing   

## 2023-06-30 NOTE — Op Note (Signed)
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  Foley catheter was inserted under sterile conditions.  The abdomen was then prepped and draped in usual sterile fashion and a midline laparotomy was created.  There were omental adhesions to the anterior abdominal wall secondary to tethering from malignant nodules.  These were carefully taken down in segments of omentum containing the larger nodules excised with the LigaSure.  Once were able to clear the abdominal wall of adhesions, a Bookwalter retractor was placed and the rather dilated small bowel was reflected cephalad.  There was a complex mass noted in the pelvis to which the terminal ileum was densely adherent secondary to tumor.  We were able to bluntly fracture through much of this in order to mobilize the obstructed distal small bowel which by palpation felt to have a tumor within the lumen as well.  This was resected with serial fires of the blue load 75 mm Endo GIA stapler and the intervening mesentery was divided with the LigaSure.  This composing  approximately 20 cm segment and we marked the distal staple line with a silk suture.  A side-to-side, functional end-to-end anastomosis was created with an additional firing of the 75 mm Endo GIA stapler and the common enterotomy was closed with a TA 90.  The corners of the staple line were imbricated with 3 oh silks and an additional seromuscular 3-0 silk was placed at the apex of the staple line.  The mesenteric defect was left open.  On completion the anastomosis is patent, free of tension and appears well-perfused and viable.  The remainder of the small bowel was followed proximally.  There were a few small implants along the small bowel mesentery just adjacent to the serosa which were left alone.  No additional obstruction was present along the small bowel all the way to ligament of Treitz.  The colon appeared decompressed.  There was a cystic mass in the right side of the pelvis just to the right of the uterine fundus to which the small bowel had been adherent, this was partially excised and sent for pathology as well.  Additional firm nodules were palpated in the cul-de-sac to the right of the uterus as well.  At this point Dr. Alvester Morin scrubbed in and identified the right ovary and noted this to be uninvolved in the disease process.  The left ovary was also dissected and noted to be densely adherent to the sigmoid colon but not clearly involved in the malignant process.  There was a large cystic lesion likely malignant implant along the left lateral abdominal wall which was excised along with some adherent omentum and additionally sent as a specimen.  At this point the abdomen was irrigated with warm sterile saline and  the effluent was clear.  The small bowel was returned to the abdominal cavity in normal anatomical position and the omentum was brought down over the small bowel.  The fascia was closed with running looped #1 PDS starting at either end and tying centrally.  The soft tissue was irrigated,  inspected for hemostasis, and then the skin was closed with staples followed by a sterile dressing.  The patient was then awakened, extubated and taken to PACU in stable condition.    All counts were correct at the completion of the case.

## 2023-06-30 NOTE — Progress Notes (Signed)
TOC continues to monitor chart for dc planning/ TOC needs.  Please order TOC as needed.  Scarlet Abad, LCSW

## 2023-06-30 NOTE — Anesthesia Procedure Notes (Signed)
Procedure Name: Intubation Date/Time: 06/30/2023 12:46 PM  Performed by: Deri Fuelling, CRNAPre-anesthesia Checklist: Patient identified, Emergency Drugs available, Suction available and Patient being monitored Patient Re-evaluated:Patient Re-evaluated prior to induction Oxygen Delivery Method: Circle system utilized Preoxygenation: Pre-oxygenation with 100% oxygen Induction Type: IV induction Ventilation: Mask ventilation without difficulty Laryngoscope Size: Mac and 3 Grade View: Grade II Tube type: Oral Tube size: 7.0 mm Number of attempts: 1 Airway Equipment and Method: Stylet and Oral airway Placement Confirmation: ETT inserted through vocal cords under direct vision, positive ETCO2 and breath sounds checked- equal and bilateral Tube secured with: Tape Dental Injury: Teeth and Oropharynx as per pre-operative assessment

## 2023-06-30 NOTE — Anesthesia Preprocedure Evaluation (Addendum)
Anesthesia Evaluation  Patient identified by MRN, date of birth, ID band Patient awake    Reviewed: Allergy & Precautions, NPO status , Patient's Chart, lab work & pertinent test results  Airway Mallampati: III  TM Distance: >3 FB Neck ROM: Full    Dental  (+) Teeth Intact, Dental Advisory Given   Pulmonary neg pulmonary ROS   Pulmonary exam normal breath sounds clear to auscultation       Cardiovascular hypertension (110/63 preop), Pt. on medications Normal cardiovascular exam Rhythm:Regular Rate:Normal     Neuro/Psych negative neurological ROS  negative psych ROS   GI/Hepatic Neg liver ROS,GERD  Medicated and Controlled,,CT A/P:  1. Distal small-bowel obstruction with transition point in the right lower quadrant, likely associated with a complex mass in the right lower quadrant. Patient may benefit from nasogastric tube decompression and general surgical consultation. 2. Multiple peritoneal nodules, highly suspicious for carcinomatosis. Dominant heterogeneous mass superior to the cervix is not clearly related to either ovary and could reflect a drop metastasis or atypical primary pelvic malignancy. Associated mildly thickened and heterogeneous endometrium. No definite other primary malignancy identified in the abdomen or pelvis. 3. Indeterminate hypodense lesion posteriorly in the right lobe of the liver, corresponding with the ultrasound finding, worrisome for metastatic disease based on the other findings. 4. Small right pleural effusion with dependent right lower lobe atelectasis.   Suspicion for advanced gyn malignancy    Endo/Other  diabetes (prediabetic)  BMI 35  Renal/GU negative Renal ROS  negative genitourinary   Musculoskeletal negative musculoskeletal ROS (+)    Abdominal  (+) + obese  Peds  Hematology  (+) Blood dyscrasia, anemia Hb 7.6, plt 361   Anesthesia Other Findings Semaglutide  tablets  Reproductive/Obstetrics negative OB ROS                             Anesthesia Physical Anesthesia Plan  ASA: 4  Anesthesia Plan: General   Post-op Pain Management: Ofirmev IV (intra-op)*, Ketamine IV*, Dilaudid IV and Precedex   Induction: Intravenous  PONV Risk Score and Plan: 4 or greater and Ondansetron, Dexamethasone, Midazolam and Treatment may vary due to age or medical condition  Airway Management Planned: Oral ETT  Additional Equipment: None  Intra-op Plan:   Post-operative Plan: Extubation in OR  Informed Consent: I have reviewed the patients History and Physical, chart, labs and discussed the procedure including the risks, benefits and alternatives for the proposed anesthesia with the patient or authorized representative who has indicated his/her understanding and acceptance.     Dental advisory given  Plan Discussed with: CRNA  Anesthesia Plan Comments: (Crossmatch x 2 units )       Anesthesia Quick Evaluation

## 2023-06-30 NOTE — Progress Notes (Signed)
  Subjective/Chief Complaint: A bit of pressure/ discomfort this morning   Objective: Vital signs in last 24 hours: Temp:  [98 F (36.7 C)-98.8 F (37.1 C)] 98 F (36.7 C) (09/09 0535) Pulse Rate:  [59-62] 62 (09/09 0535) Resp:  [16-20] 16 (09/09 0535) BP: (110-134)/(63-71) 110/63 (09/09 0535) SpO2:  [99 %-100 %] 99 % (09/09 0535) Weight:  [92.8 kg] 92.8 kg (09/09 0549) Last BM Date : 06/27/23  Intake/Output from previous day: No intake/output data recorded. Intake/Output this shift: No intake/output data recorded.  General appearance: alert and cooperative Resp: clear to auscultation bilaterally Cardio: regular rate and rhythm GI: soft, distended. nontender  Lab Results:  Recent Labs    06/30/23 0322  WBC 10.4  HGB 7.6*  HCT 26.7*  PLT 361   BMET Recent Labs    06/30/23 0322  NA 134*  K 3.9  CL 101  CO2 26  GLUCOSE 151*  BUN 14  CREATININE 0.68  CALCIUM 8.5*   PT/INR No results for input(s): "LABPROT", "INR" in the last 72 hours. ABG No results for input(s): "PHART", "HCO3" in the last 72 hours.  Invalid input(s): "PCO2", "PO2"  Studies/Results: No results found.  Anti-infectives: Anti-infectives (From admission, onward)    Start     Dose/Rate Route Frequency Ordered Stop   06/30/23 0830  ceFAZolin (ANCEF) IVPB 2g/100 mL premix        2 g 200 mL/hr over 30 Minutes Intravenous On call to O.R. 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 Hgb 7.6 this AM, stable. T&S ordered this AM in anticipation of OR.   I recommend exploration this morning with primary goal of treating her obstruction. I discussed with her that this mass is unlikely to be resectable  but will assess this intra-op. Discussed possibility of bowel resection with anastomosis vs ostomy, and possible placement of venting gastrostomy tube in anticipation of post-op ileus and difficulty with NG placement. Discussed risks of bleeding, infection, pain, injury to bowel, bladder, ureter, blood vessels, or other intraabdominal structures, anastomotic leak, bleed or stricture if created, incisional problems/dehiscence/hernia, DVT/PE, cardiovascular or pulmonary complications. Questions welcomed and answered to her satisfaction, she wishes to proceed.    FEN: NPO, TPN. NG placement has been unsuccessful despite multiple attempts + fluoro VTE: chemical prophylaxis on hold in setting of GI bleed ID: no current abx   - per TRH -  IDA T2DM HTN  LOS: 10 days    Berna Bue 06/30/2023

## 2023-06-30 NOTE — Progress Notes (Signed)
Pt refused for her NGT to be replaced. She stated the floor already tried 3 times and she was not going to go through that again. NGT left out for now.

## 2023-06-30 NOTE — Progress Notes (Signed)
PROGRESS NOTE    Linda [name]  WUJ:[id number] DOB: 11-Dec-1965 DOA: 06/20/2023 PCP: Arnette Felts, [name]   Brief Narrative:  Linda [name] is a 57 yo female with PMH anemia, HTN, pre-DM who presented with abdominal pain. CT abdomen/pelvis obtained to rule out possible gallbladder pathology -unfortunately notable for multiple masses in the right lower quadrant superior cervix with dilated loops of bowel concerning for obstruction versus compression. Due to these findings, further workup commenced with consulting surgery, oncology, and gyn-oncology. Tumor markers ordered as well.   Assessment & Plan:   Principal Problem:   Upper GI bleed Active Problems:   Abdominal mass   IDA (iron deficiency anemia)   SBO (small bowel obstruction) (HCC)   Abnormal uterine bleeding   Essential hypertension   Type 2 diabetes mellitus with obesity (HCC)   Thickened endometrium  Metastatic adenocarcinoma, colonic primary, POA Small bowel obstruction, POA Rule out carcinomatosis, POA - 4.7 x 3 cm mass in RLL and 7.9 x 6.1 cm mass superior to cervix noted on CT. -Concern is GYN malignancy. 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  Other small scattered lesions also appreciated on imaging -Tumor markers abnormal with CA125: 39.9, CA 19-9: 793, CEA: 104.0 -General Surgery following - exploratory surgery this am - possible resection vs diversion -Medical oncology reconsulted given GI origin of tumor; GynOnc signed off - IR completed peritoneal biopsy 9/3 - Metastatic adenocarcinoma consistent with a colonic primary - Unremarkable core biopsy 9/3 of the endometrium    Acute symptomatic anemia, likely secondary to subacute versus chronic GI bleed -Previously completed H. pylori treatment for presumed gastritis per prior endoscopy with ongoing melanotic stools -Hemoglobin 5.9 g/dL at intake - stabilizing- follow labs BIW, otherwise prn.  Iron deficiency anemia - Iron 9, sat  ratio 2%, TIBC 444. Ferritin 8 - s/p 2 units PRBC so far - also received 2 days of Ferrlecit, but discontinued further doses in setting of suspected malignancy -continue to follow clinically - Recommend repeat iron studies in 3-4 months   Severe protein caloric malnutrition  Small bowel obstruction(See above) -Secondary to above, NG tube unable to be placed by staff or interventional radiology due to intolerance/pain -Continue holding NG tube placement as she appears comfortable -discussed with patient if worsening symptoms would attempt NG placement with increased anxiolytics/analgesics -PICC placed, TPN being titrated up per pharmacy   Type 2 diabetes mellitus with obesity (HCC) - Last A1c 6.5% on 12/05/2022 - SSI ordered in setting of TPN use    Essential hypertension - hold ARB in setting of above - PRN meds to be added; can make scheduled if needed  DVT prophylaxis: SCDs Start: 06/20/23 2059 Code Status:   Code Status: Full Code Family Communication: None present  Status is: Inpatient Dispo: The patient is from: Home              Anticipated d/c is to: To be determined              Anticipated d/c date is: To be determined               Patient currently not medically stable for discharge  Consultants:  Gynecology oncology, Med Onc, general surgery, interventional radiology  Procedures:  Biopsy, multiple, as above Tentative abdominal resection vs diversion pending above  Antimicrobials:  None indicated  Subjective: No acute issues or events overnight, no episodes of nausea vomiting diarrhea headache fevers or chills  Objective: Vitals:   06/29/23 1215 06/29/23 2029  06/30/23 0535 06/30/23 0549  BP: 125/70 134/71 110/63   Pulse: (!) 59 61 62   Resp: 20 16 16    Temp: 98.7 F (37.1 C) 98.8 F (37.1 C) 98 F (36.7 C)   TempSrc: Oral     SpO2: 100% 100% 99%   Weight:    92.8 kg  Height:       No intake or output data in the 24 hours ending 06/30/23 0851  Filed  Weights   06/26/23 0700 06/29/23 0500 06/30/23 0549  Weight: 92.1 kg 92.2 kg 92.8 kg    Examination:  General:  Pleasantly resting in bed, No acute distress. HEENT:  Normocephalic atraumatic.  Sclerae nonicteric, noninjected.  Extraocular movements intact bilaterally. Neck:  Without mass or deformity.  Trachea is midline. Lungs:  Clear to auscultate bilaterally without rhonchi, wheeze, or rales. Heart:  Regular rate and rhythm.  Without murmurs, rubs, or gallops. Abdomen:  Soft, nontender, nondistended.  Without guarding or rebound. Extremities: Without cyanosis, clubbing, edema, or obvious deformity. RUE PICC line clean/dry/intact. Skin:  Warm and dry, no erythema.   Data Reviewed: I have personally reviewed following labs and imaging studies  CBC: Recent Labs  Lab 06/24/23 0304 06/25/23 0143 06/26/23 0451 06/30/23 0322  WBC 12.6* 12.3* 10.5 10.4  NEUTROABS 10.1* 9.8* 8.1*  --   HGB 8.7* 8.5* 7.9* 7.6*  HCT 30.4* 29.9* 28.9* 26.7*  MCV 77.4* 78.7* 81.0 79.7*  PLT 556* 496* 429* 361   Basic Metabolic Panel: Recent Labs  Lab 06/24/23 0304 06/25/23 0143 06/26/23 0451 06/27/23 0328 06/30/23 0322  NA 133* 139 141 142 134*  K 3.6 3.5 4.2 3.9 3.9  CL 101 105 110 105 101  CO2 21* 24 23 27 26   GLUCOSE 131* 137* 140* 145* 151*  BUN 29* 27* 17 15 14   CREATININE 1.64* 1.19* 0.84 0.70 0.68  CALCIUM 9.2 9.2 9.2 9.0 8.5*  MG 2.9* 2.4 2.0 1.9 2.1  PHOS 6.1* 3.4 2.6 3.3 3.9   GFR: Estimated Creatinine Clearance: 87.7 mL/min (by C-G formula based on SCr of 0.68 mg/dL).  Liver Function Tests: Recent Labs  Lab 06/26/23 0451 06/30/23 0322  AST 22 14*  ALT 19 13  ALKPHOS 92 78  BILITOT 0.7 0.5  PROT 8.1 7.2  ALBUMIN 3.4* 3.1*   Coagulation Profile: Recent Labs  Lab 06/24/23 0942  INR 1.2   CBG: Recent Labs  Lab 06/27/23 0636 06/27/23 1209 06/27/23 1743 06/27/23 2329 06/28/23 0555  GLUCAP 145* 144* 126* 146* 145*   No results found for this or any previous  visit (from the past 240 hour(s)).   Radiology Studies: No results found.  Scheduled Meds:  Chlorhexidine Gluconate Cloth  6 each Topical Daily   influenza vac split trivalent PF  0.5 mL Intramuscular Tomorrow-1000   pantoprazole (PROTONIX) IV  40 mg Intravenous Q12H   sodium chloride flush  10-40 mL Intracatheter Q12H   sodium chloride flush  3 mL Intravenous Q12H   Continuous Infusions:   ceFAZolin (ANCEF) IV     TPN ADULT (ION) 80 mL/hr at 06/29/23 1746   TPN ADULT (ION)       LOS: 10 days   Time spent:  Azucena Fallen, DO [facility]  If 7PM-7AM, please contact night-coverage [email or web address]  06/30/2023, 8:51 AM

## 2023-06-30 NOTE — Progress Notes (Signed)
NGT noted to be curled in back of patients throat. NGT was removed. Called Dr. Fredricka Bonine to see if she would like a new NGT placed. She stated yes if possible to replace the NGT.

## 2023-06-30 NOTE — Transfer of Care (Signed)
Immediate Anesthesia Transfer of Care Note  Patient: Linda [name]  Procedure(s) Performed: EXPLORATORY LAPAROTOMY, SMALL BOWEL RESECTION, AND EXCISIONAL BIOPSIES OF MULTIPLE OMENTAL MASSES  Patient Location: PACU  Anesthesia Type:General  Level of Consciousness: awake and alert   Airway & Oxygen Therapy: Patient Spontanous Breathing and Patient connected to face mask oxygen  Post-op Assessment: Report given to RN and Post -op Vital signs reviewed and stable  Post vital signs: Reviewed and stable  Last Vitals:  Vitals Value Taken Time  BP 111/65 06/30/23 1440  Temp    Pulse 89 06/30/23 1443  Resp 22 06/30/23 1443  SpO2 100 % 06/30/23 1443  Vitals shown include unfiled device data.  Last Pain:  Vitals:   06/30/23 1131  TempSrc: Oral  PainSc:       Patients Stated Pain Goal: 1 (06/29/23 0513)  Complications: No notable events documented.

## 2023-07-01 ENCOUNTER — Encounter (HOSPITAL_COMMUNITY): Payer: Self-pay | Admitting: Surgery

## 2023-07-01 DIAGNOSIS — K56609 Unspecified intestinal obstruction, unspecified as to partial versus complete obstruction: Secondary | ICD-10-CM | POA: Diagnosis not present

## 2023-07-01 DIAGNOSIS — C786 Secondary malignant neoplasm of retroperitoneum and peritoneum: Secondary | ICD-10-CM | POA: Diagnosis not present

## 2023-07-01 DIAGNOSIS — K922 Gastrointestinal hemorrhage, unspecified: Secondary | ICD-10-CM | POA: Diagnosis not present

## 2023-07-01 DIAGNOSIS — R1900 Intra-abdominal and pelvic swelling, mass and lump, unspecified site: Secondary | ICD-10-CM | POA: Diagnosis not present

## 2023-07-01 LAB — GLUCOSE, CAPILLARY
Glucose-Capillary: 250 mg/dL — ABNORMAL HIGH (ref 70–99)
Glucose-Capillary: 261 mg/dL — ABNORMAL HIGH (ref 70–99)
Glucose-Capillary: 200 mg/dL — ABNORMAL HIGH (ref 70–99)
Glucose-Capillary: 166 mg/dL — ABNORMAL HIGH (ref 70–99)
Glucose-Capillary: 145 mg/dL — ABNORMAL HIGH (ref 70–99)

## 2023-07-01 LAB — COMPREHENSIVE METABOLIC PANEL
ALT: 11 U/L (ref 0–44)
AST: 15 U/L (ref 15–41)
Albumin: 2.7 g/dL — ABNORMAL LOW (ref 3.5–5.0)
Alkaline Phosphatase: 67 U/L (ref 38–126)
Anion gap: 8 (ref 5–15)
BUN: 19 mg/dL (ref 6–20)
CO2: 25 mmol/L (ref 22–32)
Calcium: 8.4 mg/dL — ABNORMAL LOW (ref 8.9–10.3)
Chloride: 102 mmol/L (ref 98–111)
Creatinine, Ser: 0.9 mg/dL (ref 0.44–1.00)
GFR, Estimated: >60 mL/min (ref >60)
Glucose, Bld: 277 mg/dL — ABNORMAL HIGH (ref 70–99)
Potassium: 4.9 mmol/L (ref 3.5–5.1)
Sodium: 135 mmol/L (ref 135–145)
Total Bilirubin: 1.8 mg/dL — ABNORMAL HIGH (ref 0.3–1.2)
Total Protein: 6.4 g/dL — ABNORMAL LOW (ref 6.5–8.1)

## 2023-07-01 LAB — CBC
HCT: 33.3 % — ABNORMAL LOW (ref 36.0–46.0)
Hemoglobin: 9.5 g/dL — ABNORMAL LOW (ref 12.0–15.0)
MCH: 23.1 pg — ABNORMAL LOW (ref 26.0–34.0)
MCHC: 28.5 g/dL — ABNORMAL LOW (ref 30.0–36.0)
MCV: 80.8 fL (ref 80.0–100.0)
Platelets: 370 10*3/uL (ref 150–400)
RBC: 4.12 MIL/uL (ref 3.87–5.11)
RDW: 26.7 % — ABNORMAL HIGH (ref 11.5–15.5)
WBC: 18.6 10*3/uL — ABNORMAL HIGH (ref 4.0–10.5)
nRBC: 0 % (ref 0.0–0.2)

## 2023-07-01 MED ORDER — SODIUM CHLORIDE 0.9% FLUSH: 9.0000 mL | INTRAVENOUS | Status: DC | PRN

## 2023-07-01 MED ORDER — HEPARIN SODIUM (PORCINE) 5000 UNIT/ML IJ SOLN
5000.0000 [IU] | Freq: Three times a day (TID) | INTRAMUSCULAR | Status: DC
  Administered 2023-07-01 – 2023-07-10 (×28): 5000 [IU] via SUBCUTANEOUS
  Filled 2023-07-01 (×29): qty 1

## 2023-07-01 MED ORDER — METHOCARBAMOL 1000 MG/10ML IJ SOLN
500.0000 mg | Freq: Four times a day (QID) | INTRAVENOUS | Status: DC
  Administered 2023-07-01 – 2023-07-04 (×12): 500 mg via INTRAVENOUS
  Filled 2023-07-01: qty 5
  Filled 2023-07-01 (×12): qty 500

## 2023-07-01 MED ORDER — PROCHLORPERAZINE EDISYLATE 10 MG/2ML IJ SOLN: 10.0000 mg | INTRAMUSCULAR | Status: DC | PRN

## 2023-07-01 MED ORDER — INSULIN ASPART 100 UNIT/ML IJ SOLN
0.0000 [IU] | INTRAMUSCULAR | Status: DC
  Administered 2023-07-01: 2 [IU] via SUBCUTANEOUS
  Administered 2023-07-01: 3 [IU] via SUBCUTANEOUS
  Administered 2023-07-01: 2 [IU] via SUBCUTANEOUS
  Administered 2023-07-01: 5 [IU] via SUBCUTANEOUS
  Administered 2023-07-01: 1 [IU] via SUBCUTANEOUS
  Administered 2023-07-02 (×4): 2 [IU] via SUBCUTANEOUS
  Administered 2023-07-02: 1 [IU] via SUBCUTANEOUS
  Administered 2023-07-02: 2 [IU] via SUBCUTANEOUS
  Administered 2023-07-03 (×3): 1 [IU] via SUBCUTANEOUS
  Administered 2023-07-03: 2 [IU] via SUBCUTANEOUS
  Administered 2023-07-03 – 2023-07-04 (×2): 1 [IU] via SUBCUTANEOUS
  Administered 2023-07-04: 2 [IU] via SUBCUTANEOUS
  Administered 2023-07-04: 1 [IU] via SUBCUTANEOUS
  Administered 2023-07-04: 2 [IU] via SUBCUTANEOUS
  Administered 2023-07-05 (×3): 1 [IU] via SUBCUTANEOUS

## 2023-07-01 MED ORDER — DIPHENHYDRAMINE HCL 50 MG/ML IJ SOLN: 12.5000 mg | Freq: Four times a day (QID) | INTRAMUSCULAR | Status: DC | PRN

## 2023-07-01 MED ORDER — DIPHENHYDRAMINE HCL 12.5 MG/5ML PO ELIX: 12.5000 mg | ORAL_SOLUTION | Freq: Four times a day (QID) | ORAL | Status: DC | PRN

## 2023-07-01 MED ORDER — HYDROMORPHONE 1 MG/ML IV SOLN
INTRAVENOUS | Status: DC
  Administered 2023-07-01: 0.6 mg via INTRAVENOUS
  Administered 2023-07-01: 0.3 mg via INTRAVENOUS
  Administered 2023-07-01: 30 mg via INTRAVENOUS
  Administered 2023-07-02: 0.6 mg via INTRAVENOUS
  Administered 2023-07-02: 0.5 mg via INTRAVENOUS
  Administered 2023-07-02: 0.6 mg via INTRAVENOUS
  Administered 2023-07-02 (×2): 0.3 mg via INTRAVENOUS
  Administered 2023-07-03: 0.9 mg via INTRAVENOUS
  Administered 2023-07-03: 1 mg via INTRAVENOUS
  Filled 2023-07-01: qty 30

## 2023-07-01 MED ORDER — NALOXONE HCL 0.4 MG/ML IJ SOLN: 0.4000 mg | INTRAMUSCULAR | Status: DC | PRN

## 2023-07-01 MED ORDER — TRAVASOL 10 % IV SOLN
INTRAVENOUS | Status: AC
  Filled 2023-07-01: qty 902.4

## 2023-07-01 MED ORDER — ACETAMINOPHEN 10 MG/ML IV SOLN
1000.0000 mg | Freq: Four times a day (QID) | INTRAVENOUS | Status: AC
  Administered 2023-07-01 – 2023-07-02 (×4): 1000 mg via INTRAVENOUS
  Filled 2023-07-01 (×4): qty 100

## 2023-07-01 NOTE — Progress Notes (Signed)
PHARMACY - TOTAL PARENTERAL NUTRITION CONSULT NOTE   Indication: Small bowel obstruction  Patient Measurements: Height: 5\' 4"  (162.6 cm) Weight: 92.8 kg (204 lb 9.4 oz) IBW/kg (Calculated) : 54.7 TPN AdjBW (KG): 64.5 Body mass index is 35.12 kg/m.  Assessment:  Pharmacy consulted to start TPN on 57 yo female diagnosed with small bowel obstruction.   Recent Labs    06/30/23 0322 06/30/23 1255 07/01/23 0515  NA 134* 110* 135  K 3.9 3.3* 4.9  CL 101  --  102  CO2 26  --  25  GLUCOSE 151*  --  277*  BUN 14  --  19  CREATININE 0.68  --  0.90  CALCIUM 8.5*  --  8.4*  PHOS 3.9  --   --   MG 2.1  --   --   ALBUMIN 3.1*  --  2.7*  ALKPHOS 78  --  67  AST 14*  --  15  ALT 13  --  11  BILITOT 0.5  --  1.8*  TRIG 153*  --   --      Glucose / Insulin:  - PMH of pre-DM on semaglutide PTA, A1c 6.5 on 12/05/22 - CBGs/SSI dc'd on 9/7 - Patient had two CBGs >180 on 9/9 PM (190 and 277) Electrolytes: labs 9/9 and 9/10 - Corrected Ca 9.4 (stable) - K 4.9 (higher end of normal range) - All other electrolytes WNL Renal:  - SCr WNL. BUN WNL. Hepatic: labs 9/10 - LFTs all WNL - Albumin 2.7 (low) - T bili 1.8 (slightly elevated) - Triglycerides 153 (slightly elevated) Intake / Output; MIVF:  - UOP: -1350 ml/24 hrs - PO: +60 ml/24 hrs - Net intake/output: +1952 ml/24 hrs - Multiple failed NGT placement attempts (last failed attempt 9/9 PM) GI Imaging: - 9/1 Distal small-bowel obstruction with transition point in the right lower quadrant, likely associated with a complex mass in the right lower quadrant. Patient may benefit from nasogastric tube decompression and general surgical consultation. Concern for malignancy.  GI Surgeries / Procedures:  - 9/9 exploratory laparotomy with possible bowel obstruction resection/G-tube placement: Right sided pelvic mass, small bowel resection. Multiple omental and abdominal wall excisional biopsies.  Central access: PICC placed for TPN 9/2  TPN  start date: 9/2  Nutritional Goals: Goal TPN rate is 80 mL/hr (provides ~ 90 g of protein and  1820 kcals per day)  RD Assessment: Estimated Needs Total Energy Estimated Needs: 1700-1900 Total Protein Estimated Needs: 80-90g Total Fluid Estimated Needs: 1.9L/day  Current Nutrition:  NPO TPN  Plan: Continue TPN at goal rate of 80 mL/hr Electrolytes in TPN:  Na 100 mEq/L K 45 mEq/L (decreased 9/10) Ca 81mEq/L Mg 3 mEq/L Phos 7 mmol/L  Cl:Ac 1:2 Add standard MVI and trace elements to TPN Thiamine 100 mg IV every day x 5 days completed 9/7 Add back sensitive sliding scale insulin q4hrs Monitor TPN labs on Mon/Thurs and as needed   Cherylin Mylar, PharmD Clinical Pharmacist  9/10/20248:35 AM

## 2023-07-01 NOTE — Op Note (Signed)
GYNECOLOGIC ONCOLOGY OPERATIVE NOTE - INTRAOPERATIVE CONSULT  Date of Service: 06/20/2023  Preoperative Diagnosis: Malignant obstruction  Postoperative Diagnosis: Same, carcinomatosis  Procedures:  Exploratory laparotomy, distal small bowel resection with primary anastomosis, excisional biopsy of pelvic, multiple abdominal wall and omental masses   Surgeon: Phylliss Blakes MD FACS Assistant: Angelena Form MD FACS Intraoperative Consult: Clide Cliff MD  Anesthesia: General  Estimated Blood Loss: 100 mL    Findings: A soft tissue friable implant was noted at the right uterine cornua, invasive to the uterus. This had been the location of the small bowel obstruction to this peritoneal implant. The right ovary was identified underlying this soft tissue implant and distinct from it. The right ovary appeared atrophic and normal. On the left, the sigmoid colon was densely adherent to the left pelvic sidewall and the left adnexa. The left ovary was ultimately identified and also normal and atrophic in appearance. A cystic collection was noted in the left paracolic gutter, separate from the left adnexa. Nodularity was palpated in the posterior cul-de-sac, particularly along the right sidewall and the right uterosacral ligament. The anterior uterus was completed adherent to the anterior abdominal wall with the anterior cul-de-sac obliterated.   Specimens:  ID Type Source Tests Collected by Time Destination  1 : Omental masses Tissue PATH GI Other SURGICAL PATHOLOGY Berna Bue, MD 06/30/2023 1251   2 : Right lower quadrant mass Tissue PATH GI Other SURGICAL PATHOLOGY Berna Bue, MD 06/30/2023 1254   3 : Portion of ileum, stitch marks distal Tissue PATH GI Other SURGICAL PATHOLOGY Berna Bue, MD 06/30/2023 1308   4 : Left abdominal wall cyst Tissue PATH Soft tissue resection SURGICAL PATHOLOGY Berna Bue, MD 06/30/2023 1406     Complications:  None  Indications for Procedure:  Linda [name] is a 57 y.o. woman with malignant small bowel obstruction. Imaging with peritoneal implants with preoperative biopsy with adenocarcinoma, consistent with colonic primary. I was called to the OR for concern for possible ovarian mass/question ovarian cancer or primary.  Procedure: On my presentation to the OR, the small bowel resection had already been performed. The bowels were packed into the upper abdomen and the uterus was being visualized. The anterior uterus was completed adherent to the anterior abdominal wall with the anterior cul-de-sac completely obliterated. A soft tissue friable implant was noted at the right uterine cornua, invasive to the uterus. This had been the location of the small bowel obstruction to this peritoneal implant.  A kelly was placed on the left uterine cornua. The sigmoid colon was noted to be adherent to the left pelvic sidewall and left adnexa. The left fallopian tube was grasped and adhesions of the colon to the left adnexa were lysed with metzenbaum scissors to attempt to visualize the left ovary. In doing so, bleeding was noted from the mesosalpinx. The fallopian tube and mesosalpinx was doubly clamped. The left pelvic sidewall was incised and the left retroperitoneum entered. The left ureter was identified. The fallopian tube was transected and each pedicle was suture ligated with 2-0 vicryl. Additional stitch was placed and hemostasis was achieved. The ovary was then visualized, still adherent to the colon and the left pelvic sidewall but normal and atrophic in appearance. The left adnexa was noted to be completed separate from the cystic mass of the left paracolic gutter.   Attention was then turned to the right adnexa. The fallopian tube was grasped with a babcock. With the tube elevated, the right ovary was  visualized and grasped with a babcock. This was noted to be underlying the right uterine cornua soft tissue implant but completely separate from this  implant. The ovary was normal and atrophic in appearance. The posterior cul-de-sac was palpated and nodularity was noted particularly along the right peritoneum and on the right uterosacral ligament.   Given that the bilateral ovaries were ultimately visualized and normal in appearance, separate from the other peritoneal implants and cystic masses, this does not appear to be ovarian in origin at this time which is consistent with the prior VIR biopsy pathology. The small bowel resection will be sent to pathology for additional evaluation. Await final pathology. No additional intraoperative procedures recommended at this time.   Case was returned to Dr. Fredricka Bonine.   Clide Cliff, MD Gynecologic Oncology

## 2023-07-01 NOTE — Progress Notes (Signed)
  Subjective/Chief Complaint: Expected pain.   Objective: Vital signs in last 24 hours: Temp:  [97.5 F (36.4 C)-98.9 F (37.2 C)] 98.5 F (36.9 C) (09/10 0428) Pulse Rate:  [64-102] 95 (09/10 0428) Resp:  [13-21] 17 (09/10 0428) BP: (99-131)/(58-90) 116/66 (09/10 0428) SpO2:  [94 %-100 %] 95 % (09/10 0428) Last BM Date : 06/25/23 (per pt)  Intake/Output from previous day: 09/09 0701 - 09/10 0700 In: 3402.9 [P.O.:60; I.V.:2992.9; IV Piggyback:350] Out: 1450 [Urine:1350; Blood:100] Intake/Output this shift: No intake/output data recorded.  General appearance: alert and cooperative Resp: clear to auscultation bilaterally Cardio: regular rate and rhythm GI: soft, distended, appropriately tender. Incision c/d/I with scant staining on honeycomb  Lab Results:  Recent Labs    06/30/23 0322 06/30/23 1255 07/01/23 0515  WBC 10.4  --  18.6*  HGB 7.6* 8.5* 9.5*  HCT 26.7* 25.0* 33.3*  PLT 361  --  370   BMET Recent Labs    06/30/23 0322 06/30/23 1255 07/01/23 0515  NA 134* 110* 135  K 3.9 3.3* 4.9  CL 101  --  102  CO2 26  --  25  GLUCOSE 151*  --  277*  BUN 14  --  19  CREATININE 0.68  --  0.90  CALCIUM 8.5*  --  8.4*   PT/INR No results for input(s): "LABPROT", "INR" in the last 72 hours. ABG Recent Labs    06/30/23 1255  HCO3 18.6*    Studies/Results: No results found.  Anti-infectives: Anti-infectives (From admission, onward)    Start     Dose/Rate Route Frequency Ordered Stop   06/30/23 0830  ceFAZolin (ANCEF) IVPB 2g/100 mL premix        2 g 200 mL/hr over 30 Minutes Intravenous On call to O.R. 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 Await bowel  function. Multimodal pain control. Mobilize as able, pulmonary toilet. Start SQH.    FEN: NPO, TPN. NG placement has been unsuccessful despite multiple attempts  VTE: chemical prophylaxis on hold in setting of GI bleed ID: no current abx   - per [facility] -  IDA T2DM HTN  LOS: 11 days    Linda [name] 07/01/2023

## 2023-07-01 NOTE — Progress Notes (Signed)
PROGRESS NOTE    Linda [name]  ZOX:[id number] DOB: 04-Oct-1966 DOA: 06/20/2023 PCP: Arnette Felts, [name]   Brief Narrative:  Ms. Daube is a 57 yo female with PMH anemia, HTN, pre-DM who presented with abdominal pain. CT abdomen/pelvis obtained to rule out possible gallbladder pathology -unfortunately notable for multiple masses in the right lower quadrant superior cervix with dilated loops of bowel concerning for obstruction versus compression. Due to these findings, further workup commenced with consulting surgery, oncology, and gyn-oncology. Tumor markers ordered as well.   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 - Concern is GYN malignancy. 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  Other small scattered lesions also appreciated on imaging -Tumor markers abnormal with CA125: 39.9, CA 19-9: 793, CEA: 104.0 - General Surgery/GynOnc following - exploratory surgery 9/9 - distal small bowel resection with excisional biopsy of omental, pelvic, and omental masses. -Pain poorly controlled postoperatively, Dilaudid PCA pump initiated 9/10   Acute symptomatic anemia, likely secondary to subacute versus chronic GI bleed -Previously completed H. pylori treatment for presumed gastritis per prior endoscopy with ongoing melanotic stools -Hemoglobin 5.9 g/dL at intake - stabilizing- follow labs BIW, otherwise prn.  Iron deficiency anemia - Iron 9, sat ratio 2%, TIBC 444. Ferritin 8 - s/p 2  units PRBC so far - also received 2 days of Ferrlecit, but discontinued further doses in setting of suspected malignancy -continue to follow clinically - Recommend repeat iron studies in 3-4 months   Severe protein caloric malnutrition  Small bowel obstruction(See above) -Secondary to above, NG tube unable to be placed by staff or interventional radiology due to intolerance/pain -Hopeful for bowel function return to wean off TPN back to PO nutrition; diet per Sx -PICC placed, TPN being titrated up per pharmacy   Type 2 diabetes mellitus with obesity (HCC) - Last A1c 6.5% on 12/05/2022 - SSI ordered in setting of TPN use    Essential hypertension - hold ARB in setting of above - PRN meds to be added; can make scheduled if needed  DVT prophylaxis: heparin injection 5,000 Units Start: 07/01/23 1000 SCDs Start: 06/20/23 2059 Code Status:   Code Status: Full Code Family Communication: None present  Status is: Inpatient Dispo: The patient is from: Home              Anticipated d/c is to: To be determined              Anticipated d/c date is: To be determined               Patient currently not medically stable for discharge  Consultants:  Gynecology oncology, Med Onc, general surgery, interventional radiology  Procedures:  Biopsy, multiple, as above Tentative abdominal resection vs diversion pending above  Antimicrobials:  None indicated  Subjective: No acute issues or events overnight, pain somewhat poorly controlled this morning, PCA pump being added by surgery team.  Denies nausea vomiting headache fevers chills diarrhea constipation otherwise  Objective:  Vitals:   06/30/23 1700 06/30/23 1754 06/30/23 1923 07/01/23 0428  BP: (!) 99/59 112/64 125/72 116/66  Pulse: 93 92 86 95  Resp: 19 20 18 17   Temp: 98 F (36.7 C) 98.1 F (36.7 C) 98 F (36.7 C) 98.5 F (36.9 C)  TempSrc:  Oral    SpO2: 95% 97% 97% 95%  Weight:      Height:        Intake/Output Summary (Last 24  hours) at 07/01/2023 1033 Last data filed at 07/01/2023 0600 Gross per 24 hour  Intake 3402.88 ml  Output 1450 ml  Net 1952.88 ml    Filed Weights   06/26/23 0700 06/29/23 0500 06/30/23 0549  Weight: 92.1 kg 92.2 kg 92.8 kg    Examination:  General:  Pleasantly resting in bed, No acute distress. HEENT:  Normocephalic atraumatic.  Sclerae nonicteric, noninjected.  Extraocular movements intact bilaterally. Neck:  Without mass or deformity.  Trachea is midline. Lungs:  Clear to auscultate bilaterally without rhonchi, wheeze, or rales. Heart:  Regular rate and rhythm.  Without murmurs, rubs, or gallops. Abdomen: Midline dressing clean dry intact Extremities: Without cyanosis, clubbing, edema, or obvious deformity. RUE PICC line clean/dry/intact. Skin:  Warm and dry, no erythema.   Data Reviewed: I have personally reviewed following labs and imaging studies  CBC: Recent Labs  Lab 06/25/23 0143 06/26/23 0451 06/30/23 0322 06/30/23 1255 07/01/23 0515  WBC 12.3* 10.5 10.4  --  18.6*  NEUTROABS 9.8* 8.1*  --   --   --   HGB 8.5* 7.9* 7.6* 8.5* 9.5*  HCT 29.9* 28.9* 26.7* 25.0* 33.3*  MCV 78.7* 81.0 79.7*  --  80.8  PLT 496* 429* 361  --  370   Basic Metabolic Panel: Recent Labs  Lab 06/25/23 0143 06/26/23 0451 06/27/23 0328 06/30/23 0322 06/30/23 1255 07/01/23 0515  NA 139 141 142 134* 110* 135  K 3.5 4.2 3.9 3.9 3.3* 4.9  CL 105 110 105 101  --  102  CO2 24 23 27 26   --  25  GLUCOSE 137* 140* 145* 151*  --  277*  BUN 27* 17 15 14   --  19  CREATININE 1.19* 0.84 0.70 0.68  --  0.90  CALCIUM 9.2 9.2 9.0 8.5*  --  8.4*  MG 2.4 2.0 1.9 2.1  --   --   PHOS 3.4 2.6 3.3 3.9  --   --    GFR: Estimated Creatinine Clearance: 77.9 mL/min (by C-G formula based on SCr of 0.9 mg/dL).  Liver Function Tests: Recent Labs  Lab 06/26/23 0451 06/30/23 0322 07/01/23 0515  AST 22 14* 15  ALT 19 13 11   ALKPHOS 92 78 67  BILITOT 0.7 0.5 1.8*  PROT 8.1 7.2 6.4*  ALBUMIN 3.4*  3.1* 2.7*   CBG: Recent Labs  Lab 06/27/23 2329 06/28/23 0555 06/30/23 1046 06/30/23 1806 07/01/23 1016  GLUCAP 146* 145* 96 190* 250*   No results found for this or any previous visit (from the past 240 hour(s)).   Radiology Studies: No results found.  Scheduled Meds:  Chlorhexidine Gluconate Cloth  6 each Topical Daily   heparin injection (subcutaneous)  5,000 Units Subcutaneous Q8H   HYDROmorphone   Intravenous Q4H   influenza vac split trivalent PF  0.5 mL Intramuscular Tomorrow-1000   insulin aspart  0-9 Units Subcutaneous Q4H   pantoprazole (PROTONIX) IV  40 mg Intravenous Q12H   sodium chloride flush  10-40 mL Intracatheter Q12H   sodium chloride flush  3 mL Intravenous Q12H   Continuous Infusions:  acetaminophen     methocarbamol (ROBAXIN) IV 500 mg (07/01/23 1025)   TPN ADULT (ION) 80 mL/hr at 06/30/23 1732   TPN ADULT (ION)       LOS: 11 days   Time spent:  Azucena Fallen, DO [facility]  If 7PM-7AM, please contact night-coverage [email or web address]  07/01/2023, 10:33 AM

## 2023-07-01 NOTE — Progress Notes (Signed)
Nutrition Follow-up  INTERVENTION:   -TPN management per Pharmacy -Daily weights while on TPN  NUTRITION DIAGNOSIS:   Inadequate oral intake related to chronic illness as evidenced by per patient/family report.  Ongoing.  GOAL:   Patient will meet greater than or equal to 90% of their needs  Meeting with TPN.  MONITOR:   Labs, Weight trends, I & O's (TPN)   ASSESSMENT:   57 yo female with PMH anemia, HTN, pre-DM who presented with abdominal pain.  She recently underwent EGD on 05/08/2023.  She was started on H. pylori treatment after procedure.  She completed treatment course approximately 2 weeks prior to admission.  She has also been on oral iron and having dark stools. She was admitted for blood transfusion and GI evaluation.  8/30: admitted, NPO-> CLD 8/31: NPO -> regular diet 9/1: Heart healthy diet -> NPO->CLD 9/2: TPN initiated, NPO 9/9: s/p Exploratory laparotomy, distal small bowel resection with primary anastomosis, excisional biopsy of pelvic, multiple abdominal wall and omental masses   Patient continues to be NPO. Still unable to have NGT placed.  TPN continues at goal rate of 80 ml/hr, providing 1820 kcals and 90g protein.  Admission weight: 206 lbs Current weight: 204 lbs  Medications reviewed.  Labs reviewed: CBGs: 96-261   Diet Order:   Diet Order             Diet NPO time specified Except for: Other (See Comments)  Diet effective now                   EDUCATION NEEDS:   Not appropriate for education at this time  Skin:  Skin Assessment: Skin Integrity Issues: Skin Integrity Issues:: Incisions Incisions: 9/9 abdomen  Last BM:  9/4  Height:   Ht Readings from Last 1 Encounters:  06/20/23 5\' 4"  (1.626 m)    Weight:   Wt Readings from Last 1 Encounters:  06/30/23 92.8 kg    BMI:  Body mass index is 35.12 kg/m.  Estimated Nutritional Needs:   Kcal:  1700-1900  Protein:  80-90g  Fluid:  1.9L/day   Tilda Franco,  MS, RD, LDN Inpatient Clinical Dietitian Contact information available via Amion

## 2023-07-01 NOTE — Plan of Care (Signed)

## 2023-07-02 ENCOUNTER — Encounter: Payer: Self-pay | Admitting: Nurse Practitioner

## 2023-07-02 DIAGNOSIS — K769 Liver disease, unspecified: Secondary | ICD-10-CM | POA: Diagnosis not present

## 2023-07-02 DIAGNOSIS — C189 Malignant neoplasm of colon, unspecified: Secondary | ICD-10-CM | POA: Diagnosis not present

## 2023-07-02 DIAGNOSIS — C786 Secondary malignant neoplasm of retroperitoneum and peritoneum: Secondary | ICD-10-CM

## 2023-07-02 DIAGNOSIS — K922 Gastrointestinal hemorrhage, unspecified: Secondary | ICD-10-CM | POA: Diagnosis not present

## 2023-07-02 DIAGNOSIS — R7303 Prediabetes: Secondary | ICD-10-CM

## 2023-07-02 DIAGNOSIS — C179 Malignant neoplasm of small intestine, unspecified: Secondary | ICD-10-CM | POA: Diagnosis not present

## 2023-07-02 DIAGNOSIS — E43 Unspecified severe protein-calorie malnutrition: Secondary | ICD-10-CM | POA: Insufficient documentation

## 2023-07-02 DIAGNOSIS — D509 Iron deficiency anemia, unspecified: Secondary | ICD-10-CM | POA: Diagnosis not present

## 2023-07-02 DIAGNOSIS — K56609 Unspecified intestinal obstruction, unspecified as to partial versus complete obstruction: Secondary | ICD-10-CM | POA: Diagnosis not present

## 2023-07-02 LAB — GLUCOSE, CAPILLARY
Glucose-Capillary: 155 mg/dL — ABNORMAL HIGH (ref 70–99)
Glucose-Capillary: 181 mg/dL — ABNORMAL HIGH (ref 70–99)
Glucose-Capillary: 193 mg/dL — ABNORMAL HIGH (ref 70–99)
Glucose-Capillary: 159 mg/dL — ABNORMAL HIGH (ref 70–99)
Glucose-Capillary: 139 mg/dL — ABNORMAL HIGH (ref 70–99)
Glucose-Capillary: 156 mg/dL — ABNORMAL HIGH (ref 70–99)

## 2023-07-02 LAB — MAGNESIUM: Magnesium: 1.9 mg/dL (ref 1.7–2.4)

## 2023-07-02 LAB — BASIC METABOLIC PANEL
Anion gap: 9 (ref 5–15)
BUN: 15 mg/dL (ref 6–20)
CO2: 26 mmol/L (ref 22–32)
Calcium: 8.4 mg/dL — ABNORMAL LOW (ref 8.9–10.3)
Chloride: 101 mmol/L (ref 98–111)
Creatinine, Ser: 0.68 mg/dL (ref 0.44–1.00)
GFR, Estimated: >60 mL/min (ref >60)
Glucose, Bld: 166 mg/dL — ABNORMAL HIGH (ref 70–99)
Potassium: 3.9 mmol/L (ref 3.5–5.1)
Sodium: 136 mmol/L (ref 135–145)

## 2023-07-02 LAB — CBC
HCT: 25 % — ABNORMAL LOW (ref 36.0–46.0)
Hemoglobin: 7.3 g/dL — ABNORMAL LOW (ref 12.0–15.0)
MCH: 23.5 pg — ABNORMAL LOW (ref 26.0–34.0)
MCHC: 29.2 g/dL — ABNORMAL LOW (ref 30.0–36.0)
MCV: 80.4 fL (ref 80.0–100.0)
Platelets: 299 10*3/uL (ref 150–400)
RBC: 3.11 MIL/uL — ABNORMAL LOW (ref 3.87–5.11)
RDW: 26.9 % — ABNORMAL HIGH (ref 11.5–15.5)
WBC: 19.4 10*3/uL — ABNORMAL HIGH (ref 4.0–10.5)
nRBC: 0 % (ref 0.0–0.2)

## 2023-07-02 LAB — PREPARE RBC (CROSSMATCH)

## 2023-07-02 MED ORDER — ACETAMINOPHEN 10 MG/ML IV SOLN
1000.0000 mg | Freq: Four times a day (QID) | INTRAVENOUS | Status: AC
  Administered 2023-07-02 – 2023-07-03 (×4): 1000 mg via INTRAVENOUS
  Filled 2023-07-02 (×4): qty 100

## 2023-07-02 MED ORDER — TRAVASOL 10 % IV SOLN
INTRAVENOUS | Status: AC
  Filled 2023-07-02: qty 902.4

## 2023-07-02 MED ORDER — SODIUM CHLORIDE 0.9% IV SOLUTION: Freq: Once | INTRAVENOUS | Status: AC

## 2023-07-02 NOTE — Assessment & Plan Note (Signed)
-   Multifactorial etiology at this point.  Overt iron deficiency on admission, newly diagnosed malignancy, and some expected blood loss from recent surgery on 06/30/2023 - Hgb 8.1 g/dL this morning  -Still needs DVT prophylaxis with HSQ given high risk of hypercoagulability/VTE; also needs Khorana score (currently at least 3 points, high risk) calculated prior to discharge and/or before treatment most likely

## 2023-07-02 NOTE — Progress Notes (Signed)
[facility] Progress Note  2 Days Post-Op  Subjective: CC-  Having abdominal pain but this is fairly well controlled with PCA. No flatus or BM. Denies nausea or vomiting. She got up to the chair yesterday. Hgb 7.3 from 9.5. getting 1u PRBCs today.  Objective: Vital signs in last 24 hours: Temp:  [98 F (36.7 C)-98.9 F (37.2 C)] 98.2 F (36.8 C) (09/11 0338) Pulse Rate:  [83-90] 85 (09/11 0752) Resp:  [17-24] 17 (09/11 0752) BP: (107-119)/(54-64) 118/55 (09/11 0752) SpO2:  [93 %-100 %] 96 % (09/11 0752) FiO2 (%):  [21 %] 21 % (09/10 1125) Weight:  [92.6 kg] 92.6 kg (09/11 0418) Last BM Date : 06/25/23  Intake/Output from previous day: 09/10 0701 - 09/11 0700 In: -  Out: 1300 [Urine:1300] Intake/Output this shift: No intake/output data recorded.  PE: Gen:  Alert, NAD, pleasant Card:  RRR Pulm:  rate and effort normal Abd: distended but soft, right sided tenderness, hypoactive bowel sounds, incision with honeycomb dressing present and some dried blood at inferior aspect  Lab Results:  Recent Labs    07/01/23 0515 07/02/23 0442  WBC 18.6* 19.4*  HGB 9.5* 7.3*  HCT 33.3* 25.0*  PLT 370 299   BMET Recent Labs    07/01/23 0515 07/02/23 0442  NA 135 136  K 4.9 3.9  CL 102 101  CO2 25 26  GLUCOSE 277* 166*  BUN 19 15  CREATININE 0.90 0.68  CALCIUM 8.4* 8.4*   PT/INR No results for input(s): "LABPROT", "INR" in the last 72 hours. CMP     Component Value Date/Time   NA 136 07/02/2023 0442   NA 141 05/16/2022 1044   K 3.9 07/02/2023 0442   CL 101 07/02/2023 0442   CO2 26 07/02/2023 0442   GLUCOSE 166 (H) 07/02/2023 0442   BUN 15 07/02/2023 0442   BUN 9 05/16/2022 1044   CREATININE 0.68 07/02/2023 0442   CALCIUM 8.4 (L) 07/02/2023 0442   PROT 6.4 (L) 07/01/2023 0515   PROT 7.8 05/16/2022 1044   ALBUMIN 2.7 (L) 07/01/2023 0515   ALBUMIN 4.4 05/16/2022 1044   AST 15 07/01/2023 0515   ALT 11 07/01/2023 0515   ALKPHOS 67 07/01/2023 0515    BILITOT 1.8 (H) 07/01/2023 0515   BILITOT <0.2 05/16/2022 1044   GFRNONAA >60 07/02/2023 0442   Lipase     Component Value Date/Time   LIPASE 35 06/20/2023 1436       Studies/Results: No results found.  Anti-infectives: Anti-infectives (From admission, onward)    Start     Dose/Rate Route Frequency Ordered Stop   06/30/23 0830  ceFAZolin (ANCEF) IVPB 2g/100 mL premix        2 g 200 mL/hr over 30 Minutes Intravenous On call to O.R. 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 Fredricka Bonine - surgical path pending - Continue NPO/bowel rest and await return in bowel function. Continue TPN - mobilize as able - continue PCA, IV tylenol and IV robaxin - Hgb 7.3 from 9.5, getting 1u PRBCs this morning - WBC up 19.4, afebrile. Monitor  FEN: NPO, TPN VTE: sqh ID: no current abx   - per TR[facility] -  IDA T2DM  HTN   LOS: 12 days    Franne Forts, [facility] 07/02/2023, 9:23 AM Please see Amion for pager number during day hours 7:00am-4:30pm

## 2023-07-02 NOTE — Consult Note (Signed)
[facility]  [phone]   HEMATOLOGY ONCOLOGY INPATIENT CONSULTATION   Linda [name]  DOB: 12-25-1965  MR#: [id number]  CSN#: [id number]    Requesting Physician: GYN ONCOLOGY Dr. Alvester Morin   Patient Care Team: Arnette Felts, [name] as PCP - General (General Practice)  Reason for consult: Metastatic small bowel cancer  History of present illness: Patient is a 57 year old female with past medical history of hypertension, prediabetes, obesity, was recently admitted for small bowel obstruction.  Unfortunately her workup showed a metastatic small bowel cancer to the peritoneum.  I was called to see her to discuss treatment.  She has had intermittent abdominal pain and nausea since last December 2023.  She underwent EGD and colonoscopy in June 2023 which was negative for malignancy.  The episodes happens every few months, and last about 2 days.  She also has intermittent constipation.  She presented to emergency room for worsening abdominal pain, nausea, weakness on June 20, 2023, lab reviewed severe anemia and iron deficiency.  CT of abdomen pelvis showed distal small bowel obstruction with associated a complex mass in the right lower quadrant, multiple peritoneal nodules, highly suspicious for carcinomatosis.  Dominant heterogeneous mass is superior to the cervix.  Indeterminant hypodense lesion posterior in the right lobe of the liver.  Patient was seen by GYN and general surgery, underwent endometrial biopsy which was benign.  She underwent CT-guided peritoneal nodule biopsy which showed metastatic adenocarcinoma consistent with a chronic a primary.  Patient received IV iron and blood transfusion.  She was taken to the OR on June 30, 2023, and underwent terminal ileum small bowel/mass resection, and end-to-end anastomosis.  She is recovering well from surgery.  Pain is controlled.  She has not had a bowel movement yet.  MEDICAL HISTORY:  Past Medical History:   Diagnosis Date   Anemia    Hypertension    Pre-diabetes 02/2022    SURGICAL HISTORY: Past Surgical History:  Procedure Laterality Date   BREAST LUMPECTOMY WITH RADIOACTIVE SEED LOCALIZATION Left 06/10/2022   Procedure: LEFT BREAST LUMPECTOMY WITH RADIOACTIVE SEED LOCALIZATION;  Surgeon: Griselda Miner, MD;  Location: [facility];  Service: General;  Laterality: Left;   CESAREAN SECTION     x4   LAPAROTOMY N/A 06/30/2023   Procedure: EXPLORATORY LAPAROTOMY, SMALL BOWEL RESECTION, AND EXCISIONAL BIOPSIES OF MULTIPLE OMENTAL MASSES;  Surgeon: Berna Bue, MD;  Location: [facility];  Service: General;  Laterality: N/A;   TUBAL LIGATION      SOCIAL HISTORY: Social History   Socioeconomic History   Marital status: Single    Spouse name: Not on file   Number of children: 4   Years of education: Not on file   Highest education level: Not on file  Occupational History    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  Tobacco Use   Smoking status: Never   Smokeless tobacco: Never  Vaping Use   Vaping status: Never Used  Substance and Sexual Activity   Alcohol use: No   Drug use: No   Sexual activity: Not Currently    Birth control/protection: Surgical  Other Topics Concern   Not on file  Social History Narrative   Not on file   Social Determinants of Health   Financial Resource Strain: Not on file  Food Insecurity: No Food Insecurity (06/20/2023)   Hunger Vital Sign    Worried About Running Out of Food in the Last Year: Never true    Ran Out of Food  in the Last Year: Never true  Transportation Needs: No Transportation Needs (06/20/2023)   PRAPARE - Administrator, Civil Service (Medical): No    Lack of Transportation (Non-Medical): No  Physical Activity: Not on file  Stress: Not on file  Social Connections: Not on file  Intimate Partner Violence: Not At Risk (06/20/2023)   Humiliation, Afraid, Rape, and Kick questionnaire    Fear of Current  or Ex-Partner: No    Emotionally Abused: No    Physically Abused: No    Sexually Abused: No    FAMILY HISTORY: Family History  Problem Relation Age of Onset   Memory loss Mother    Hypertension Father    Hypertension Brother    Diabetes Maternal Grandmother    Hypertension Maternal Grandmother    Hypertension Maternal Grandfather    Colon cancer Neg Hx    Stomach cancer Neg Hx    Esophageal cancer Neg Hx     ALLERGIES:  has No Known Allergies.  MEDICATIONS:  Current Facility-Administered Medications  Medication Dose Route Frequency Provider Last Rate Last Admin   acetaminophen (OFIRMEV) IV 1,000 mg  1,000 mg Intravenous Q6H Meuth, Brooke A, PA-C 400 mL/hr at 07/02/23 2000 1,000 mg at 07/02/23 2000   Chlorhexidine Gluconate Cloth 2 % PADS 6 each  6 each Topical Daily Meuth, Brooke A, PA-C   6 each at 07/02/23 1248   diphenhydrAMINE (BENADRYL) injection 12.5 mg  12.5 mg Intravenous Q6H PRN Meuth, Brooke A, PA-C       Or   diphenhydrAMINE (BENADRYL) 12.5 MG/5ML elixir 12.5 mg  12.5 mg Oral Q6H PRN Meuth, Brooke A, PA-C       heparin injection 5,000 Units  5,000 Units Subcutaneous Q8H Phylliss Blakes A, MD   5,000 Units at 07/02/23 1815   hydrALAZINE (APRESOLINE) injection 10 mg  10 mg Intravenous Q4H PRN Meuth, Brooke A, PA-C       HYDROmorphone (DILAUDID) 1 mg/mL PCA injection   Intravenous Q4H Meuth, Brooke A, PA-C   0.3 mg at 07/02/23 1944   influenza vac split trivalent PF (FLULAVAL) injection 0.5 mL  0.5 mL Intramuscular Tomorrow-1000 Meuth, Brooke A, PA-C       insulin aspart (novoLOG) injection 0-9 Units  0-9 Units Subcutaneous Q4H Cherylin Mylar, [facility]   1 Units at 07/02/23 2008   labetalol (NORMODYNE) injection 10 mg  10 mg Intravenous Q4H PRN Meuth, Brooke A, PA-C       melatonin tablet 5 mg  5 mg Oral QHS PRN Meuth, Brooke A, PA-C   5 mg at 06/26/23 2158   methocarbamol (ROBAXIN) 500 mg in dextrose 5 % 50 mL IVPB  500 mg Intravenous Q6H Meuth, Brooke A, PA-C 110 mL/hr at  07/02/23 1608 500 mg at 07/02/23 1608   naloxone (NARCAN) injection 0.4 mg  0.4 mg Intravenous PRN Meuth, Brooke A, PA-C       And   sodium chloride flush (NS) 0.9 % injection 9 mL  9 mL Intravenous PRN Meuth, Brooke A, PA-C       ondansetron (ZOFRAN) tablet 4 mg  4 mg Oral Q6H PRN Meuth, Brooke A, PA-C       Or   ondansetron (ZOFRAN) injection 4 mg  4 mg Intravenous Q6H PRN Meuth, Brooke A, PA-C   4 mg at 06/30/23 0008   Oral care mouth rinse  15 mL Mouth Rinse PRN Meuth, Brooke A, PA-C       pantoprazole (PROTONIX) injection 40 mg  40 mg Intravenous Q12H Meuth, Brooke A, PA-C   40 mg at 07/02/23 6295   prochlorperazine (COMPAZINE) injection 10 mg  10 mg Intravenous Q4H PRN Meuth, Brooke A, PA-C       sodium chloride flush (NS) 0.9 % injection 10-40 mL  10-40 mL Intracatheter Q12H Meuth, Brooke A, PA-C   10 mL at 07/01/23 2211   sodium chloride flush (NS) 0.9 % injection 10-40 mL  10-40 mL Intracatheter PRN Meuth, Brooke A, PA-C       sodium chloride flush (NS) 0.9 % injection 3 mL  3 mL Intravenous Q12H Meuth, Brooke A, PA-C   3 mL at 07/02/23 1028   TPN ADULT (ION)   Intravenous Continuous TPN Cherylin Mylar, RPH 80 mL/hr at 07/02/23 1737 New Bag at 07/02/23 1737    REVIEW OF SYSTEMS:   Constitutional: Denies fevers, chills or abnormal night sweats, (+) fatigue and no eight loss  Eyes: Denies blurriness of vision, double vision or watery eyes Ears, nose, mouth, throat, and face: Denies mucositis or sore throat Respiratory: Denies cough, dyspnea or wheezes Cardiovascular: Denies palpitation, chest discomfort or lower extremity swelling Gastrointestinal:  see HPI  Skin: Denies abnormal skin rashes Lymphatics: Denies new lymphadenopathy or easy bruising Neurological:Denies numbness, tingling or new weaknesses Behavioral/Psych: Mood is stable, no new changes  All other systems were reviewed with the patient and are negative.  PHYSICAL EXAMINATION: ECOG PERFORMANCE STATUS: 2 - Symptomatic,  <50% confined to bed  Vitals:   07/02/23 1944 07/02/23 2017  BP:  134/66  Pulse:  80  Resp: 20 18  Temp:  99.7 F (37.6 C)  SpO2: 98% 98%   Filed Weights   06/29/23 0500 06/30/23 0549 07/02/23 0418  Weight: 203 lb 4.2 oz (92.2 kg) 204 lb 9.4 oz (92.8 kg) 204 lb 2.3 oz (92.6 kg)    GENERAL:alert, no distress and comfortable SKIN: skin color, texture, turgor are normal, no rashes or significant lesions EYES: normal, conjunctiva are pink and non-injected, sclera clear OROPHARYNX:no exudate, no erythema and lips, buccal mucosa, and tongue normal  NECK: supple, thyroid normal size, non-tender, without nodularity LYMPH:  no palpable lymphadenopathy in the cervical, axillary or inguinal LUNGS: clear to auscultation and percussion with normal breathing effort HEART: regular rate & rhythm and no murmurs and no lower extremity edema ABDOMEN:abdomen soft, non-tender and normal bowel sounds Musculoskeletal:no cyanosis of digits and no clubbing  PSYCH: alert & oriented x 3 with fluent speech NEURO: no focal motor/sensory deficits  LABORATORY DATA:  I have reviewed the data as listed Lab Results  Component Value Date   WBC 19.4 (H) 07/02/2023   HGB 7.3 (L) 07/02/2023   HCT 25.0 (L) 07/02/2023   MCV 80.4 07/02/2023   PLT 299 07/02/2023   Recent Labs    06/26/23 0451 06/27/23 0328 06/30/23 0322 06/30/23 1255 07/01/23 0515 07/02/23 0442  NA 141   < > 134* 110* 135 136  K 4.2   < > 3.9 3.3* 4.9 3.9  CL 110   < > 101  --  102 101  CO2 23   < > 26  --  25 26  GLUCOSE 140*   < > 151*  --  277* 166*  BUN 17   < > 14  --  19 15  CREATININE 0.84   < > 0.68  --  0.90 0.68  CALCIUM 9.2   < > 8.5*  --  8.4* 8.4*  GFRNONAA >60   < > >60  --  >  60 >60  PROT 8.1  --  7.2  --  6.4*  --   ALBUMIN 3.4*  --  3.1*  --  2.7*  --   AST 22  --  14*  --  15  --   ALT 19  --  13  --  11  --   ALKPHOS 92  --  78  --  67  --   BILITOT 0.7  --  0.5  --  1.8*  --    < > = values in this interval not  displayed.    RADIOGRAPHIC STUDIES: I have personally reviewed the radiological images as listed and agreed with the findings in the report. CT BIOPSY  Result Date: 06/24/2023 INDICATION: 57 year old with peritoneal lesions and needs a tissue diagnosis. EXAM: CT-GUIDED CORE BIOPSY OF A PERITONEAL NODULE MEDICATIONS: Moderate sedation ANESTHESIA/SEDATION: Moderate (conscious) sedation was employed during this procedure. A total of Versed 1.0 mg and Fentanyl 50 mcg was administered intravenously by the radiology nurse. Total intra-service moderate Sedation Time: 18 minutes. The patient's level of consciousness and vital signs were monitored continuously by radiology nursing throughout the procedure under my direct supervision. FLUOROSCOPY TIME:  None COMPLICATIONS: None immediate. PROCEDURE: Informed written consent was obtained from the patient after a thorough discussion of the procedural risks, benefits and alternatives. All questions were addressed. A timeout was performed prior to the initiation of the procedure. Patient was placed supine on the CT scanner. Images through the abdomen were obtained. An anterior midline peritoneal nodule was identified near the umbilicus. The right side of the abdomen was prepped with chlorhexidine and sterile field was created. Skin was anesthetized using 1% lidocaine. Using CT guidance, 17 gauge coaxial needle was directed into the peritoneal nodule. Needle position was confirmed within the nodule. Total of 3 core biopsies were obtained with an 18 gauge device. Specimens placed in formalin. Needle was removed and follow up CT images were obtained. Bandage placed over the puncture site. RADIATION DOSE REDUCTION: This exam was performed according to the departmental dose-optimization program which includes automated exposure control, adjustment of the mA and/or kV according to patient size and/or use of iterative reconstruction technique. FINDINGS: CT images demonstrate  diffuse dilatation of small bowel loops compatible with small bowel obstruction and findings are similar to the previous diagnostic CT. Peritoneal nodule in the anterior midline just posterior to the umbilicus was targeted. Coaxial needle was confirmed within the nodule. Three adequate core specimens were obtained. IMPRESSION: CT-guided core biopsy of an anterior abdominal peritoneal nodule. Electronically Signed   By: Richarda Overlie M.D.   On: 06/24/2023 16:50   DG Fluoro Rm 1-60 Min - No Report  Result Date: 06/24/2023 Fluoroscopy was utilized by the requesting physician.  No radiographic interpretation.   [place] EKG SITE RITE  Result Date: 06/23/2023 If Site Rite image not attached, placement could not be confirmed due to current cardiac rhythm.  [place] EKG SITE RITE  Result Date: 06/23/2023 If Site Rite image not attached, placement could not be confirmed due to current cardiac rhythm.  CT ABDOMEN PELVIS W CONTRAST  Result Date: 06/22/2023 CLINICAL DATA:  Epigastric pain. Indeterminate right liver lesion on ultrasound. * Tracking Code: BO * EXAM: CT ABDOMEN AND PELVIS WITH CONTRAST TECHNIQUE: Multidetector CT imaging of the abdomen and pelvis was performed using the standard protocol following bolus administration of intravenous contrast. RADIATION DOSE REDUCTION: This exam was performed according to the departmental dose-optimization program which includes automated exposure control, adjustment of the  mA and/or kV according to patient size and/or use of iterative reconstruction technique. CONTRAST:  OMNIPAQUE IOHEXOL 300 MG/ML  SOLN COMPARISON:  Abdominal ultrasound 06/21/2023 and 10/29/2022. FINDINGS: Lower chest: Small right pleural effusion with dependent right lower lobe atelectasis. The left lung base is clear. Hepatobiliary: The liver is normal in density without definite signs of steatosis or morphologic changes of cirrhosis. There is an indeterminate hypodense lesion posteriorly in the right lobe  measuring 1.3 x 1.3 cm on image 26/2, corresponding with the ultrasound finding. Although nonspecific by this examination, this is worrisome for metastatic disease based on additional findings described below. Possible additional tiny lesion more inferiorly in the right lobe on image 34/2. No evidence of gallstones, gallbladder wall thickening or biliary dilatation. Pancreas: Unremarkable. No pancreatic ductal dilatation or surrounding inflammatory changes. Spleen: Normal in size without focal abnormality. Adrenals/Urinary Tract: Both adrenal glands appear normal. No evidence of urinary tract calculus, suspicious renal lesion or hydronephrosis. Mild renal cortical scarring on the right. The bladder appears unremarkable for its degree of distention. Stomach/Bowel: Enteric contrast was administered and has passed into the mid small bowel. The stomach and proximal small bowel appear normal. There are multiple moderately dilated loops of mid to distal small bowel with air-levels consistent with a distal small-bowel obstruction. Transition point is in the right lower quadrant and likely associated with a complex mass measuring approximately 4.7 x 3.0 cm on image 79/2. The distal small bowel is decompressed. The appendix is not well visualized, although demonstrates no definite abnormality. The colon is decompressed without wall thickening or focal abnormality. There are mild diverticular changes throughout the descending and sigmoid colon. Vascular/Lymphatic: 2.9 x 2.2 cm mesenteric nodule on image 50/2 may reflect an enlarged lymph node. No other enlarged abdominal or pelvic lymph nodes. No significant vascular findings. No evidence of aneurysm or large vessel occlusion. Reproductive: The endometrium appears mildly thickened and heterogeneous, measuring up to 1.5 cm in thickness on sagittal image 66/6. No definite myometrial abnormality. There is a large heterogeneous mass superior to the cervix which measures 7.9 x 6.1  cm on image 81/2. This is not clearly related to either ovary. The right lower quadrant mass described above causing apparent distal small bowel obstruction may be related to the right ovary. No left ovarian mass identified. Other: No evidence of abdominal wall hernia, generalized ascites or pneumoperitoneum. There are multiple peritoneal nodules, highly suspicious for carcinomatosis. Representative lesions include a 2.1 cm lesion deep to the umbilicus on image 70/2, a 3.1 x 2.4 cm lesion in the right lower quadrant on image 71/2 and anterior left upper quadrant lesion measuring 2.0 x 1.4 cm lesion on image 53/2. Enhancing nodule measuring 2.8 cm in the left mid abdomen is associated with an adjacent cystic structure measuring up to 8.5 x 7.3 cm on image 64/2, possibly an area of loculated ascites. Additional pelvic masses are described above. Musculoskeletal: No acute or significant osseous findings. Mild degenerative changes of both hips. Unless specific follow-up recommendations are mentioned in the findings or impression sections, no imaging follow-up of any mentioned incidental findings is recommended. IMPRESSION: 1. Distal small-bowel obstruction with transition point in the right lower quadrant, likely associated with a complex mass in the right lower quadrant. Patient may benefit from nasogastric tube decompression and general surgical consultation. 2. Multiple peritoneal nodules, highly suspicious for carcinomatosis. Dominant heterogeneous mass superior to the cervix is not clearly related to either ovary and could reflect a drop metastasis or atypical primary pelvic  malignancy. Associated mildly thickened and heterogeneous endometrium. No definite other primary malignancy identified in the abdomen or pelvis. 3. Indeterminate hypodense lesion posteriorly in the right lobe of the liver, corresponding with the ultrasound finding, worrisome for metastatic disease based on the other findings. 4. Small right  pleural effusion with dependent right lower lobe atelectasis. 5. Appropriate oncology referral recommended for additional diagnosis and staging. PET-CT may be helpful. Electronically Signed   By: Carey Bullocks M.D.   On: 06/22/2023 15:02   US ABDOMEN LIMITED RUQ (LIVER/GB)  Result Date: 06/21/2023 CLINICAL DATA:  Epigastric pain for 3 days EXAM: ULTRASOUND ABDOMEN LIMITED RIGHT UPPER QUADRANT COMPARISON:  10/29/2022 FINDINGS: Gallbladder: No gallstones or wall thickening visualized. No sonographic Murphy sign noted by sonographer. Common bile duct: Diameter: 3 mm Liver: Diffuse increased liver echotexture compatible with hepatic steatosis. There is a focal area of fatty sparing near the gallbladder fossa. Within the right lobe liver there is an indistinct 2.5 x 2.3 x 1.8 cm hypoechoic region, nonspecific. No intrahepatic duct dilation. Portal vein is patent on color Doppler imaging with normal direction of blood flow towards the liver. Other: Small right pleural effusion incidentally noted. IMPRESSION: 1. Hepatic steatosis, with focal fatty sparing near the gallbladder fossa. 2. Indeterminate 2.5 cm hypoechoic region within the right lobe liver, not seen on prior imaging. When the patient is clinically stable and able to follow directions and hold their breath (preferably as an outpatient) further evaluation with dedicated abdominal MRI should be considered. 3. Right pleural effusion. Electronically Signed   By: Sharlet Salina M.D.   On: 06/21/2023 16:40    ASSESSMENT & PLAN:  57 year old female, with past medical history of hypertension and prediabetes, obesity, presented with intermittent abdominal pain and nausea for the past one year.   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  Her surgical pathology results are still pending. -I recommend CT chest without contrast to complete staging -I recommend liver MRI with and without contrast to evaluate her liver lesion -I discussed the likely incurable nature of her metastatic disease and overall poor prognosis.  However if she does not have liver or thoracic metastasis, and if she has good response to chemotherapy, she may be a candidate for HIPEC surgery in future with the goal of cure.  -I discussed the main treatment for her metastatic cancer would be chemotherapy, such as FOLFOX or CapeOx.  I recommend a port placement by general surgery, she is agreeable. -I will request MMR and next generation sequencing Tempus on her surgical sample, to see if she is a candidate for immunotherapy or targeted therapy. -please give her at least 1g iv iron for her severe anemia.  -All questions were answered.  I plan to see her back in the office 1 to 2 weeks after her discharge, and start her on chemotherapy when she recovers well from surgery.   All questions were answered. The patient knows to call the clinic with any problems, questions or concerns.  I spent a total of 60 minutes for her visit today, more than 50% time on face-to-face counseling.      Malachy Mood, MD 07/02/2023

## 2023-07-02 NOTE — Progress Notes (Signed)
PHARMACY - TOTAL PARENTERAL NUTRITION CONSULT NOTE   Indication: Small bowel obstruction  Patient Measurements: Height: 5\' 4"  (162.6 cm) Weight: 92.6 kg (204 lb 2.3 oz) IBW/kg (Calculated) : 54.7 TPN AdjBW (KG): 64.5 Body mass index is 35.04 kg/m.  Assessment:  Pharmacy consulted to start TPN on 57 yo female diagnosed with small bowel obstruction.   Recent Labs    06/30/23 0322 06/30/23 1255 07/01/23 0515 07/02/23 0442  NA 134*   < > 135 136  K 3.9   < > 4.9 3.9  CL 101  --  102 101  CO2 26  --  25 26  GLUCOSE 151*  --  277* 166*  BUN 14  --  19 15  CREATININE 0.68  --  0.90 0.68  CALCIUM 8.5*  --  8.4* 8.4*  PHOS 3.9  --   --   --   MG 2.1  --   --  1.9  ALBUMIN 3.1*  --  2.7*  --   ALKPHOS 78  --  67  --   AST 14*  --  15  --   ALT 13  --  11  --   BILITOT 0.5  --  1.8*  --   TRIG 153*  --   --   --    < > = values in this interval not displayed.    Glucose / Insulin:  - PMH of pre-DM on semaglutide PTA, A1c 6.5 on 12/05/22 - CBGs 145-261 since adding back sensitive SSI 9/10 AM (15 units used) and are continuing to improve with use of SSI Electrolytes: labs 9/9 and 9/11 - Corrected Ca 9.4 (stable) - All other electrolytes WNL Renal:  - SCr WNL. BUN WNL. Hepatic: labs 9/10 - LFTs all WNL - Albumin 2.7 (low) - T bili 1.8 (slightly elevated) - Triglycerides 153 (slightly elevated) Intake / Output; MIVF:  - UOP: -1300 ml/24 hrs - Net intake/output: intake not recorded last 24 hrs - Multiple failed NGT placement attempts (last failed attempt 9/9 PM) GI Imaging: - 9/1 Distal small-bowel obstruction with transition point in the right lower quadrant, likely associated with a complex mass in the right lower quadrant. Patient may benefit from nasogastric tube decompression and general surgical consultation. Concern for malignancy.  GI Surgeries / Procedures:  - 9/9 exploratory laparotomy with possible bowel obstruction resection/G-tube placement: Right sided pelvic  mass, small bowel resection. Multiple omental and abdominal wall excisional biopsies.  Central access: PICC placed for TPN 9/2  TPN start date: 9/2  Nutritional Goals: Goal TPN rate is 80 mL/hr (provides ~ 90 g of protein and  1820 kcals per day)  RD Assessment: Estimated Needs Total Energy Estimated Needs: 1700-1900 Total Protein Estimated Needs: 80-90g Total Fluid Estimated Needs: 1.9L/day  Current Nutrition:  NPO TPN  Plan: Continue TPN at goal rate of 80 mL/hr Electrolytes in TPN:  Na 100 mEq/L K 45 mEq/L Ca 77mEq/L Mg 3 mEq/L Phos 7 mmol/L  Cl:Ac 1:2 Add standard MVI and trace elements to TPN Thiamine 100 mg IV every day x 5 days completed 9/7 Continue sensitive SSI q4hrs Monitor TPN labs on Mon/Thurs    Cherylin Mylar, PharmD Clinical Pharmacist  9/11/20247:39 AM

## 2023-07-02 NOTE — Plan of Care (Signed)
°  Problem: Activity: °Goal: Risk for activity intolerance will decrease °Outcome: Progressing °  °Problem: Elimination: °Goal: Will not experience complications related to urinary retention °Outcome: Progressing °  °Problem: Pain Managment: °Goal: General experience of comfort will improve °Outcome: Progressing °  °

## 2023-07-02 NOTE — Progress Notes (Signed)
Progress Note    Linda [name]   FAO:[id number]  DOB: [birth year]  DOA: 06/20/2023     12 PCP: Arnette Felts, [name]  Initial CC: dark stools, abdominal pain  Hospital Course: Linda [name] is a 57 yo female with PMH anemia, HTN, pre-DM who presented with abdominal pain.  She recently underwent EGD on 05/08/2023.  She was started on H. pylori treatment after procedure.  She completed treatment course approximately 2 weeks prior to admission. She has also been on oral iron and having dark stools. Due to worsening weakness and ongoing abdominal discomfort, she presented to the ER for further evaluation. Hemoglobin was found to be 5.9 g/dL.  She was admitted for blood transfusion and GI evaluation. She had recent colonoscopy and upper endoscopy performed June 2023.  She was not recommended for repeat scopes.  RUQ ultrasound showed hepatic steatosis and hypoechoic region in the right lobe liver.  Due to abdominal pain on admission, further workup commenced with CT abdomen/pelvis in case of gallbladder pathology. CT was notable for multiple findings.  There was a large mass measuring 4.7 x 3 cm noted in the right lower quadrant and also large heterogenous mass superior to the cervix measuring 7.9 x 6.1 cm. Multiple dilated loops of bowel concerning for compression/obstruction were also appreciated. She also has a recent history over the past year of dysfunctional uterine bleeding and thickened endometrium noted on outpatient ultrasound and again seen on CT.  She had declined outpatient endometrial biopsy.  Due to these findings, further workup commenced with consulting surgery, oncology, and gyn-oncology. Tumor markers ordered as well.  On 9/9, she ultimately underwent ex-lap with distal small bowel resection with primary anastomosis along with excisional biopsy of pelvic, multiple abdominal wall and omental masses.  Interval History:  No events overnight.  Patient well-known to me from last week on  service.  Still no bowel movement or flatus yet since surgery but denies any nausea or vomiting.  Abdomen seems to be more comfortable in general compared to last week as well.  Assessment and Plan: * Upper GI bleed - Possibly slow/chronic blood loss from underlying "chronic inactive gastritis" noted on EGD from 05/08/2023.  No H. pylori organisms were noted on immunohistochemical stain but due to pattern of inflammation, she was started on H. pylori treatment which she completed approximately 2 weeks prior to admission -Stools were melanotic in appearance but she states she has also been on oral iron daily -Hemoglobin certainly low on admission, 5.9 g/dL.   - now also s/p ex-lap with presumed some blood loss as expected - will continue to trend H/H and transfuse as necessary - no further scopes per GI at this time  Colon adenocarcinoma (HCC) - per CT there is 4.7 x 3 cm mass in RLL and mass superior to cervix 7.9 x 6.1 cm. Known thickened endometrium again seen and recent hx DUB that was being monitored outpatient.  - initial concern was for GYN malignancy; this has now been ruled out after ex-lap and pathology results peritoneal nodule needle core biopsy from 06/24/2023; endometrial biopsy also negative from 06/24/2023 -Patient underwent ex lap with distal small bowel resection with primary anastomosis, excisional biopsy of pelvic, multiple abdominal wall and omental masses on 06/30/2023 - general surgery and GYN-onc following - will re-discuss with oncology to make sure outpatient followup arranged  SBO (small bowel obstruction) (HCC) - Presented with distended abdomen and decreased bowel sounds.  Initially suspected constipation contributing but after CT, concern is now  for underlying obstruction which may be due to mass effect - general surgery following as well - now s/p ex-lap with small bowel resection with primary anastomosis and excisional biopsy of pelvic, multi abdominal wall, and omental  masses on 06/30/2023 - awaiting bowel function return s/p surgery on 9/9; remains on TPN and ideally will wean as BM return and able to finally advance diet   Microcytic anemia - Multifactorial etiology at this point.  Overt iron deficiency on admission, newly diagnosed malignancy, and some expected blood loss from recent surgery on 06/30/2023 - Hgb 7.3 g/dL this morning -Will transfuse 1 unit PRBC and continue trending -Still needs DVT prophylaxis with HSQ given high risk of hypercoagulability/VTE; also needs Khorana score calculated prior to discharge and/or before treatment most likely   IDA (iron deficiency anemia) - iron labs: Iron 9, sat ratio 2%, TIBC 444. Ferritin 8 - s/p 2 units PRBC so far - also received 2 days of Ferrlecit, but discontinued further doses in setting of malignancy - needs repeat iron studies in 3-4 months  Type 2 diabetes mellitus with obesity (HCC) - Last A1c 6.5% on 12/05/2022 - SSI ordered in setting of TPN use   Essential hypertension - hold ARB in setting of above - PRN meds to be added; can make scheduled if needed   Old records reviewed in assessment of this patient  Antimicrobials:   DVT prophylaxis:  heparin injection 5,000 Units Start: 07/01/23 1000 SCDs Start: 06/20/23 2059   Code Status:   Code Status: Full Code  Mobility Assessment (Last 72 Hours)     Mobility Assessment     Row Name 07/02/23 0745 07/01/23 1945 07/01/23 0744 06/30/23 2000 06/30/23 0847   Does patient have an order for bedrest or is patient medically unstable No - Continue assessment No - Continue assessment No - Continue assessment No - Continue assessment No - Continue assessment   What is the highest level of mobility based on the progressive mobility assessment? Level 6 (Walks independently in room and hall) - Balance while walking in room without assist - Complete Level 6 (Walks independently in room and hall) - Balance while walking in room without assist - Complete  Level 6 (Walks independently in room and hall) - Balance while walking in room without assist - Complete Level 6 (Walks independently in room and hall) - Balance while walking in room without assist - Complete Level 6 (Walks independently in room and hall) - Balance while walking in room without assist - Complete    Row Name 06/29/23 2000           Does patient have an order for bedrest or is patient medically unstable No - Continue assessment       What is the highest level of mobility based on the progressive mobility assessment? Level 6 (Walks independently in room and hall) - Balance while walking in room without assist - Complete                Barriers to discharge: None Disposition Plan: Home Status is: Inpatient  Objective: Blood pressure (!) 127/56, pulse 83, temperature 98.8 F (37.1 C), resp. rate 20, height 5\' 4"  (1.626 m), weight 92.6 kg, SpO2 95%.  Examination:  Physical Exam Constitutional:      Appearance: Normal appearance.  HENT:     Head: Normocephalic and atraumatic.     Mouth/Throat:     Mouth: Mucous membranes are moist.  Eyes:     Extraocular Movements: Extraocular movements  intact.  Cardiovascular:     Rate and Rhythm: Regular rhythm.  Pulmonary:     Effort: Pulmonary effort is normal. No respiratory distress.     Breath sounds: Normal breath sounds. No wheezing.  Abdominal:     General: A surgical scar is present. Bowel sounds are decreased. There is no distension.     Palpations: Abdomen is soft.     Tenderness: There is no abdominal tenderness.     Comments: BS present more in the upper quadrants compared to lowers  Musculoskeletal:        General: Normal range of motion.     Cervical back: Normal range of motion and neck supple.  Skin:    General: Skin is warm and dry.  Neurological:     General: No focal deficit present.     Mental Status: She is alert.  Psychiatric:        Mood and Affect: Mood normal.      Consultants:   GI General surgery Gyn-onc Oncology IR  Procedures:  06/30/23: Exploratory laparotomy, distal small bowel resection with primary anastomosis, excisional biopsy of pelvic, multiple abdominal wall and omental masses   Data Reviewed: Results for orders placed or performed during the hospital encounter of 06/20/23 (from the past 24 hour(s))  Glucose, capillary     Status: Abnormal   Collection Time: 07/01/23  4:03 PM  Result Value Ref Range   Glucose-Capillary 200 (H) 70 - 99 mg/dL   Comment 1 Notify RN    Comment 2 Document in Chart   Glucose, capillary     Status: Abnormal   Collection Time: 07/01/23  8:04 PM  Result Value Ref Range   Glucose-Capillary 166 (H) 70 - 99 mg/dL  Glucose, capillary     Status: Abnormal   Collection Time: 07/01/23 11:17 PM  Result Value Ref Range   Glucose-Capillary 145 (H) 70 - 99 mg/dL  Glucose, capillary     Status: Abnormal   Collection Time: 07/02/23  3:39 AM  Result Value Ref Range   Glucose-Capillary 155 (H) 70 - 99 mg/dL  CBC     Status: Abnormal   Collection Time: 07/02/23  4:42 AM  Result Value Ref Range   WBC 19.4 (H) 4.0 - 10.5 K/uL   RBC 3.11 (L) 3.87 - 5.11 MIL/uL   Hemoglobin 7.3 (L) 12.0 - 15.0 g/dL   HCT 09.8 (L) 11.9 - 14.7 %   MCV 80.4 80.0 - 100.0 fL   MCH 23.5 (L) 26.0 - 34.0 pg   MCHC 29.2 (L) 30.0 - 36.0 g/dL   RDW 82.9 (H) 56.2 - 13.0 %   Platelets 299 150 - 400 K/uL   nRBC 0.0 0.0 - 0.2 %  Basic metabolic panel     Status: Abnormal   Collection Time: 07/02/23  4:42 AM  Result Value Ref Range   Sodium 136 135 - 145 mmol/L   Potassium 3.9 3.5 - 5.1 mmol/L   Chloride 101 98 - 111 mmol/L   CO2 26 22 - 32 mmol/L   Glucose, Bld 166 (H) 70 - 99 mg/dL   BUN 15 6 - 20 mg/dL   Creatinine, Ser 8.65 0.44 - 1.00 mg/dL   Calcium 8.4 (L) 8.9 - 10.3 mg/dL   GFR, Estimated >78 >46 mL/min   Anion gap 9 5 - 15  Magnesium     Status: None   Collection Time: 07/02/23  4:42 AM  Result Value Ref Range   Magnesium 1.9 1.7 - 2.4  mg/dL  Glucose, capillary     Status: Abnormal   Collection Time: 07/02/23  7:50 AM  Result Value Ref Range   Glucose-Capillary 181 (H) 70 - 99 mg/dL   Comment 1 Notify RN    Comment 2 Document in Chart   Prepare RBC (crossmatch)     Status: None   Collection Time: 07/02/23  8:38 AM  Result Value Ref Range   Order Confirmation      BB SAMPLE OR UNITS ALREADY AVAILABLE Performed at [facility], [address], [place], [place] [address]   Glucose, capillary     Status: Abnormal   Collection Time: 07/02/23 12:35 PM  Result Value Ref Range   Glucose-Capillary 193 (H) 70 - 99 mg/dL   Comment 1 Notify RN    Comment 2 Document in Chart     I have reviewed pertinent nursing notes, vitals, labs, and images as necessary. I have ordered labwork to follow up on as indicated.  I have reviewed the last notes from staff over past 24 hours. I have discussed patient's care plan and test results with nursing staff, CM/SW, and other staff as appropriate.  Time spent: Greater than 50% of the 55 minute visit was spent in counseling/coordination of care for the patient as laid out in the A&P.   LOS: 12 days   Lewie Chamber, MD [facility] 07/02/2023, 3:58 PM

## 2023-07-03 ENCOUNTER — Inpatient Hospital Stay (HOSPITAL_COMMUNITY): Payer: BC Managed Care – PPO

## 2023-07-03 DIAGNOSIS — K56609 Unspecified intestinal obstruction, unspecified as to partial versus complete obstruction: Secondary | ICD-10-CM | POA: Diagnosis not present

## 2023-07-03 DIAGNOSIS — C189 Malignant neoplasm of colon, unspecified: Secondary | ICD-10-CM | POA: Diagnosis not present

## 2023-07-03 DIAGNOSIS — D509 Iron deficiency anemia, unspecified: Secondary | ICD-10-CM | POA: Diagnosis not present

## 2023-07-03 DIAGNOSIS — K922 Gastrointestinal hemorrhage, unspecified: Secondary | ICD-10-CM | POA: Diagnosis not present

## 2023-07-03 LAB — CBC
HCT: 27.6 % — ABNORMAL LOW (ref 36.0–46.0)
Hemoglobin: 8.1 g/dL — ABNORMAL LOW (ref 12.0–15.0)
MCH: 24 pg — ABNORMAL LOW (ref 26.0–34.0)
MCHC: 29.3 g/dL — ABNORMAL LOW (ref 30.0–36.0)
MCV: 81.7 fL (ref 80.0–100.0)
Platelets: 295 10*3/uL (ref 150–400)
RBC: 3.38 MIL/uL — ABNORMAL LOW (ref 3.87–5.11)
RDW: 26 % — ABNORMAL HIGH (ref 11.5–15.5)
WBC: 20.2 10*3/uL — ABNORMAL HIGH (ref 4.0–10.5)
nRBC: 0 % (ref 0.0–0.2)

## 2023-07-03 LAB — COMPREHENSIVE METABOLIC PANEL
ALT: 12 U/L (ref 0–44)
AST: 13 U/L — ABNORMAL LOW (ref 15–41)
Albumin: 2.4 g/dL — ABNORMAL LOW (ref 3.5–5.0)
Alkaline Phosphatase: 79 U/L (ref 38–126)
Anion gap: 9 (ref 5–15)
BUN: 13 mg/dL (ref 6–20)
CO2: 25 mmol/L (ref 22–32)
Calcium: 8.2 mg/dL — ABNORMAL LOW (ref 8.9–10.3)
Chloride: 100 mmol/L (ref 98–111)
Creatinine, Ser: 0.73 mg/dL (ref 0.44–1.00)
GFR, Estimated: >60 mL/min (ref >60)
Glucose, Bld: 161 mg/dL — ABNORMAL HIGH (ref 70–99)
Potassium: 3.6 mmol/L (ref 3.5–5.1)
Sodium: 134 mmol/L — ABNORMAL LOW (ref 135–145)
Total Bilirubin: 0.9 mg/dL (ref 0.3–1.2)
Total Protein: 6.4 g/dL — ABNORMAL LOW (ref 6.5–8.1)

## 2023-07-03 LAB — TYPE AND SCREEN
ABO/RH(D): O POS
Antibody Screen: NEGATIVE
Unit division: 0
Unit division: 0

## 2023-07-03 LAB — BPAM RBC
Blood Product Expiration Date: 202410102359
Blood Product Expiration Date: 202410102359
ISSUE DATE / TIME: 202409091102
ISSUE DATE / TIME: 202409111408
Unit Type and Rh: 5100
Unit Type and Rh: 5100

## 2023-07-03 LAB — MAGNESIUM: Magnesium: 2 mg/dL (ref 1.7–2.4)

## 2023-07-03 LAB — GLUCOSE, CAPILLARY
Glucose-Capillary: 161 mg/dL — ABNORMAL HIGH (ref 70–99)
Glucose-Capillary: 147 mg/dL — ABNORMAL HIGH (ref 70–99)
Glucose-Capillary: 134 mg/dL — ABNORMAL HIGH (ref 70–99)
Glucose-Capillary: 86 mg/dL (ref 70–99)
Glucose-Capillary: 140 mg/dL — ABNORMAL HIGH (ref 70–99)
Glucose-Capillary: 128 mg/dL — ABNORMAL HIGH (ref 70–99)

## 2023-07-03 LAB — PHOSPHORUS: Phosphorus: 2.6 mg/dL (ref 2.5–4.6)

## 2023-07-03 MED ORDER — DOCUSATE SODIUM 100 MG PO CAPS
100.0000 mg | ORAL_CAPSULE | Freq: Two times a day (BID) | ORAL | Status: DC
  Administered 2023-07-03 – 2023-07-16 (×25): 100 mg via ORAL
  Filled 2023-07-03 (×25): qty 1

## 2023-07-03 MED ORDER — ACETAMINOPHEN 325 MG PO TABS
650.0000 mg | ORAL_TABLET | Freq: Four times a day (QID) | ORAL | Status: DC
  Administered 2023-07-03 – 2023-07-15 (×47): 650 mg via ORAL
  Filled 2023-07-03 (×48): qty 2

## 2023-07-03 MED ORDER — TRAVASOL 10 % IV SOLN
INTRAVENOUS | Status: DC
Start: 2023-07-03 — End: 2023-07-03

## 2023-07-03 MED ORDER — TRAVASOL 10 % IV SOLN
INTRAVENOUS | Status: AC
  Filled 2023-07-03: qty 902.4

## 2023-07-03 MED ORDER — HYDROMORPHONE HCL 1 MG/ML IJ SOLN
0.5000 mg | INTRAMUSCULAR | Status: DC | PRN
  Administered 2023-07-03 – 2023-07-06 (×15): 1 mg via INTRAVENOUS
  Filled 2023-07-03 (×15): qty 1

## 2023-07-03 MED ORDER — GADOBUTROL 1 MMOL/ML IV SOLN
9.0000 mL | Freq: Once | INTRAVENOUS | Status: AC | PRN
  Administered 2023-07-03: 9 mL via INTRAVENOUS

## 2023-07-03 MED ORDER — POTASSIUM PHOSPHATES 15 MMOLE/5ML IV SOLN
15.0000 mmol | Freq: Once | INTRAVENOUS | Status: AC
  Administered 2023-07-03: 15 mmol via INTRAVENOUS
  Filled 2023-07-03 (×3): qty 5

## 2023-07-03 NOTE — Progress Notes (Signed)
Patient ID: Linda [name], female   DOB: Dec 29, 1965, 57 y.o.   MRN: [id number] [facility] Surgery Progress Note  3 Days Post-Op  Subjective: CC-  Up walking around the room. Abdomen sore but pain well controlled. Denies any n/v. She had a large BM this morning, not passing a lot of flatus.  Objective: Vital signs in last 24 hours: Temp:  [97.9 F (36.6 C)-100.1 F (37.8 C)] 97.9 F (36.6 C) (09/12 0432) Pulse Rate:  [72-83] 72 (09/12 0432) Resp:  [16-22] 18 (09/12 0432) BP: (117-134)/(54-67) 133/67 (09/12 0432) SpO2:  [94 %-99 %] 98 % (09/12 0432) FiO2 (%):  [21 %] 21 % (09/12 0312) Last BM Date : 06/25/23  Intake/Output from previous day: 09/11 0701 - 09/12 0700 In: 3551.2 [I.V.:2639.7; Blood:301.5; IV Piggyback:610] Out: -  Intake/Output this shift: No intake/output data recorded.  PE: Gen:  Alert, NAD, pleasant Card:  RRR Pulm:  rate and effort normal Abd: soft, distension a little less than yesterday, mild right sided tenderness, hypoactive bowel sounds, incision with honeycomb dressing present and some dried blood at inferior aspect  Lab Results:  Recent Labs    07/02/23 0442 07/03/23 0354  WBC 19.4* 20.2*  HGB 7.3* 8.1*  HCT 25.0* 27.6*  PLT 299 295   BMET Recent Labs    07/02/23 0442 07/03/23 0354  NA 136 134*  K 3.9 3.6  CL 101 100  CO2 26 25  GLUCOSE 166* 161*  BUN 15 13  CREATININE 0.68 0.73  CALCIUM 8.4* 8.2*   PT/INR No results for input(s): "LABPROT", "INR" in the last 72 hours. CMP     Component Value Date/Time   NA 134 (L) 07/03/2023 0354   NA 141 05/16/2022 1044   K 3.6 07/03/2023 0354   CL 100 07/03/2023 0354   CO2 25 07/03/2023 0354   GLUCOSE 161 (H) 07/03/2023 0354   BUN 13 07/03/2023 0354   BUN 9 05/16/2022 1044   CREATININE 0.73 07/03/2023 0354   CALCIUM 8.2 (L) 07/03/2023 0354   PROT 6.4 (L) 07/03/2023 0354   PROT 7.8 05/16/2022 1044   ALBUMIN 2.4 (L) 07/03/2023 0354   ALBUMIN 4.4 05/16/2022 1044   AST 13 (L)  07/03/2023 0354   ALT 12 07/03/2023 0354   ALKPHOS 79 07/03/2023 0354   BILITOT 0.9 07/03/2023 0354   BILITOT <0.2 05/16/2022 1044   GFRNONAA >60 07/03/2023 0354   Lipase     Component Value Date/Time   LIPASE 35 06/20/2023 1436       Studies/Results: No results found.  Anti-infectives: Anti-infectives (From admission, onward)    Start     Dose/Rate Route Frequency Ordered Stop   06/30/23 0830  ceFAZolin (ANCEF) IVPB 2g/100 mL premix        2 g 200 mL/hr over 30 Minutes Intravenous On call to O.R. 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 Fredricka Bonine - surgical path pending - Ok for clear liquids, advised pt to take this very slowly. Add colace. Continue TPN - mobilize  - continue PCA today, IV robaxin, po tylenol - Hgb 8.1 stable, s/p 1u PRBCs 9/11 - WBC up 20.2, TMAX 100.2. Monitor - will discuss port placement with surgeon  next week   FEN: CLD, TPN VTE: sqh ID: no current abx   - per TR[facility] -  IDA T2DM HTN    LOS: 13 days    Franne Forts, [facility] 07/03/2023, 9:45 AM Please see Amion for pager number during day hours 7:00am-4:30pm

## 2023-07-03 NOTE — Progress Notes (Addendum)
PHARMACY - TOTAL PARENTERAL NUTRITION CONSULT NOTE   Indication: Small bowel obstruction  Patient Measurements: Height: 5\' 4"  (162.6 cm) Weight: 92.6 kg (204 lb 2.3 oz) IBW/kg (Calculated) : 54.7 TPN AdjBW (KG): 64.5 Body mass index is 35.04 kg/m.  Assessment:  Pharmacy consulted to start TPN on 57 yo female diagnosed with small bowel obstruction. S/p exploratory laparotomy on 9/9 and now awaiting return of bowel function.   Recent Labs    07/01/23 0515 07/02/23 0442 07/03/23 0354  NA 135 136 134*  K 4.9 3.9 3.6  CL 102 101 100  CO2 25 26 25   GLUCOSE 277* 166* 161*  BUN 19 15 13   CREATININE 0.90 0.68 0.73  CALCIUM 8.4* 8.4* 8.2*  PHOS  --   --  2.6  MG  --  1.9 2.0  ALBUMIN 2.7*  --  2.4*  ALKPHOS 67  --  79  AST 15  --  13*  ALT 11  --  12  BILITOT 1.8*  --  0.9    Glucose / Insulin:  - PMH of pre-DM on semaglutide PTA, A1c 6.5 on 12/05/22 - CBGs 139-193 in the last 24hrs -- continuing to improve since adding back sensitive SSI 9/10 AM (12 units used in last 24hrs) Electrolytes: labs 9/12 - Corrected Ca 9.3 (stable) - Na 134 (slightly low) - Phos 2.6 (lower end of normal range) - K 3.6 (lower end of normal range) - All other electrolytes WNL Renal:  - SCr WNL. BUN WNL. Hepatic: labs 9/12 - LFTs, Tbili WNL - Albumin 2.4 (low) - Triglycerides 153 (slightly elevated) on 9/9 Intake / Output; MIVF:  - UOP: not recorded in last 24 hrs - Multiple failed NGT placement attempts (last failed attempt 9/9 PM) GI Imaging: - 9/1 Distal small-bowel obstruction with transition point in the right lower quadrant, likely associated with a complex mass in the right lower quadrant. Patient may benefit from nasogastric tube decompression and general surgical consultation. Concern for malignancy.  GI Surgeries / Procedures:  - 9/9 exploratory laparotomy with possible bowel obstruction resection/G-tube placement: Right sided pelvic mass, small bowel resection. Multiple omental and  abdominal wall excisional biopsies.  Central access: PICC placed for TPN 9/2  TPN start date: 9/2  Nutritional Goals: Goal TPN rate is 80 mL/hr (provides ~ 90 g of protein and  1820 kcals per day)  RD Assessment: Estimated Needs Total Energy Estimated Needs: 1700-1900 Total Protein Estimated Needs: 80-90g Total Fluid Estimated Needs: 1.9L/day  Current Nutrition:  NPO TPN  Plan: Now: Give IV Kphos x1  At 1800: Continue TPN at goal rate of 80 mL/hr Electrolytes in TPN:  Na 100 mEq/L K 45 mEq/L Ca 105mEq/L Mg 3 mEq/L Phos 7 mmol/L  Cl:Ac 1:2 Add standard MVI and trace elements to TPN Thiamine 100 mg IV every day x 5 days completed 9/7 Continue sensitive SSI q4hrs Monitor TPN labs on Mon/Thurs  F/u BMP and phos in the AM  Addendum: Patient's TPN was stopped at 1252 for MRI. New TPN to start at 1800 tonight. Given close proximity to next TPN, will defer D10 infusion for now.    Cherylin Mylar, PharmD Clinical Pharmacist  9/12/20248:47 AM

## 2023-07-03 NOTE — Progress Notes (Signed)
Nutrition Follow-up  INTERVENTION:   -TPN management per Pharmacy -Daily weights while on TPN  -Monitor diet advancement  NUTRITION DIAGNOSIS:   Inadequate oral intake related to chronic illness as evidenced by per patient/family report.  Ongoing.  GOAL:   Patient will meet greater than or equal to 90% of their needs  Meeting with TPN  MONITOR:   Labs, Weight trends, I & O's (TPN)  ASSESSMENT:   57 yo female with PMH anemia, HTN, pre-DM who presented with abdominal pain.  She recently underwent EGD on 05/08/2023.  She was started on H. pylori treatment after procedure.  She completed treatment course approximately 2 weeks prior to admission.  She has also been on oral iron and having dark stools. She was admitted for blood transfusion and GI evaluation.  8/30: admitted, NPO-> CLD 8/31: NPO -> regular diet 9/1: Heart healthy diet -> NPO->CLD 9/2: TPN initiated, NPO 9/9: s/p Exploratory laparotomy, distal small bowel resection with primary anastomosis, excisional biopsy of pelvic, multiple abdominal wall and omental masses 9/12: CLD  TPN continuing at 80 ml/hr, providing 1820 kcals and 90g protein.   Admission weight: 206 lbs Current weight: 204 lbs  Medications: Colace, K-Phos   Labs reviewed: CBGs: 134-161 Low Na   Diet Order:   Diet Order             Diet clear liquid Fluid consistency: Thin  Diet effective now                   EDUCATION NEEDS:   Not appropriate for education at this time  Skin:  Skin Assessment: Skin Integrity Issues: Skin Integrity Issues:: Incisions Incisions: 9/9 abdomen  Last BM:  9/4  Height:   Ht Readings from Last 1 Encounters:  06/20/23 5\' 4"  (1.626 m)    Weight:   Wt Readings from Last 1 Encounters:  07/02/23 92.6 kg   BMI:  Body mass index is 35.04 kg/m.  Estimated Nutritional Needs:   Kcal:  1700-1900  Protein:  80-90g  Fluid:  1.9L/day   Tilda Franco, MS, RD, LDN Inpatient Clinical  Dietitian Contact information available via Amion

## 2023-07-03 NOTE — Progress Notes (Signed)
Progress Note    Linda [name]   HYQ:[id number]  DOB: [birth year]  DOA: 06/20/2023     13 PCP: Arnette Felts, [name]  Initial CC: dark stools, abdominal pain  Hospital Course: [name] is a 57 yo female with PMH anemia, HTN, pre-DM who presented with abdominal pain.  She recently underwent EGD on 05/08/2023.  She was started on H. pylori treatment after procedure.  She completed treatment course approximately 2 weeks prior to admission. She has also been on oral iron and having dark stools. Due to worsening weakness and ongoing abdominal discomfort, she presented to the ER for further evaluation. Hemoglobin was found to be 5.9 g/dL.  She was admitted for blood transfusion and GI evaluation. She had recent colonoscopy and upper endoscopy performed June 2023.  She was not recommended for repeat scopes.  RUQ ultrasound showed hepatic steatosis and hypoechoic region in the right lobe liver.  Due to abdominal pain on admission, further workup commenced with CT abdomen/pelvis in case of gallbladder pathology. CT was notable for multiple findings.  There was a large mass measuring 4.7 x 3 cm noted in the right lower quadrant and also large heterogenous mass superior to the cervix measuring 7.9 x 6.1 cm. Multiple dilated loops of bowel concerning for compression/obstruction were also appreciated. She also has a recent history over the past year of dysfunctional uterine bleeding and thickened endometrium noted on outpatient ultrasound and again seen on CT.  She had declined outpatient endometrial biopsy.  Due to these findings, further workup commenced with consulting surgery, oncology, and gyn-oncology. Tumor markers ordered as well.  On 9/9, she ultimately underwent ex-lap with distal small bowel resection with primary anastomosis along with excisional biopsy of pelvic, multiple abdominal wall and omental masses.  Interval History:  She had a bowel movement since yesterday.  Still not much  flatus.  No significant nausea nor any vomiting.  Abdomen remains soft and much less distended than previously before surgery. Trial of clear liquids being started today.  Assessment and Plan: * Upper GI bleed-resolved as of 07/03/2023 - Possibly slow/chronic blood loss from underlying "chronic inactive gastritis" noted on EGD from 05/08/2023.  No H. pylori organisms were noted on immunohistochemical stain but due to pattern of inflammation, she was started on H. pylori treatment which she completed approximately 2 weeks prior to admission -Stools were melanotic in appearance but she states she has also been on oral iron daily -Hemoglobin certainly low on admission, 5.9 g/dL.   - now also s/p ex-lap with presumed some blood loss as expected - will continue to trend H/H and transfuse as necessary - no further scopes per GI at this time  Colon adenocarcinoma (HCC) - per CT there is 4.7 x 3 cm mass in RLL and mass superior to cervix 7.9 x 6.1 cm. Known thickened endometrium again seen and recent hx DUB that was being monitored outpatient.  - initial concern was for GYN malignancy; this has now been ruled out after ex-lap and pathology results peritoneal nodule needle core biopsy from 06/24/2023; endometrial biopsy also negative from 06/24/2023 -Patient underwent ex lap with distal small bowel resection with primary anastomosis, excisional biopsy of pelvic, multiple abdominal wall and omental masses on 06/30/2023 - general surgery and GYN-onc following - oncology also now on board and following for further outpatient plans  SBO (small bowel obstruction) (HCC) - Presented with distended abdomen and decreased bowel sounds.  Initially suspected constipation contributing but after CT, concern is now for underlying  obstruction which may be due to mass effect - general surgery following as well - now s/p ex-lap with small bowel resection with primary anastomosis and excisional biopsy of pelvic, multi abdominal  wall, and omental masses on 06/30/2023 -continue TPN for now; will eventually wean - had a BM, still minimal flatus; trial of CLD per surgery today; continue bowel regimen  Microcytic anemia - Multifactorial etiology at this point.  Overt iron deficiency on admission, newly diagnosed malignancy, and some expected blood loss from recent surgery on 06/30/2023 - Hgb 8.1 g/dL this morning  -Still needs DVT prophylaxis with HSQ given high risk of hypercoagulability/VTE; also needs Khorana score (currently at least 3 points, high risk) calculated prior to discharge and/or before treatment most likely   IDA (iron deficiency anemia) - iron labs: Iron 9, sat ratio 2%, TIBC 444. Ferritin 8 - s/p 4 units PRBC so far since admission and received 500 mg Ferrlecit on admission - repeat iron studies tomorrow; if still low will further replete per oncology rec's  Type 2 diabetes mellitus with obesity (HCC) - Last A1c 6.5% on 12/05/2022 - SSI ordered in setting of TPN use   Essential hypertension - hold ARB in setting of above - PRN meds to be added; can make scheduled if needed   Old records reviewed in assessment of this patient  Antimicrobials:   DVT prophylaxis:  heparin injection 5,000 Units Start: 07/01/23 1000 SCDs Start: 06/20/23 2059   Code Status:   Code Status: Full Code  Mobility Assessment (Last 72 Hours)     Mobility Assessment     Row Name 07/02/23 2100 07/02/23 0745 07/01/23 1945 07/01/23 0744 06/30/23 2000   Does patient have an order for bedrest or is patient medically unstable No - Continue assessment No - Continue assessment No - Continue assessment No - Continue assessment No - Continue assessment   What is the highest level of mobility based on the progressive mobility assessment? Level 5 (Walks with assist in room/hall) - Balance while stepping forward/back and can walk in room with assist - Complete Level 6 (Walks independently in room and hall) - Balance while walking in  room without assist - Complete Level 6 (Walks independently in room and hall) - Balance while walking in room without assist - Complete Level 6 (Walks independently in room and hall) - Balance while walking in room without assist - Complete Level 6 (Walks independently in room and hall) - Balance while walking in room without assist - Complete            Barriers to discharge: None Disposition Plan: Home Status is: Inpatient  Objective: Blood pressure (!) 151/70, pulse 79, temperature 98 F (36.7 C), temperature source Oral, resp. rate 18, height 5\' 4"  (1.626 m), weight 92.6 kg, SpO2 100%.  Examination:  Physical Exam Constitutional:      Appearance: Normal appearance.  HENT:     Head: Normocephalic and atraumatic.     Mouth/Throat:     Mouth: Mucous membranes are moist.  Eyes:     Extraocular Movements: Extraocular movements intact.  Cardiovascular:     Rate and Rhythm: Regular rhythm.  Pulmonary:     Effort: Pulmonary effort is normal. No respiratory distress.     Breath sounds: Normal breath sounds. No wheezing.  Abdominal:     General: A surgical scar is present. Bowel sounds are decreased. There is no distension.     Palpations: Abdomen is soft.     Tenderness: There is no  abdominal tenderness.     Comments: BS present more in the upper quadrants compared to lowers  Musculoskeletal:        General: Normal range of motion.     Cervical back: Normal range of motion and neck supple.  Skin:    General: Skin is warm and dry.  Neurological:     General: No focal deficit present.     Mental Status: She is alert.  Psychiatric:        Mood and Affect: Mood normal.      Consultants:  GI General surgery Gyn-onc Oncology IR  Procedures:  06/30/23: Exploratory laparotomy, distal small bowel resection with primary anastomosis, excisional biopsy of pelvic, multiple abdominal wall and omental masses   Data Reviewed: Results for orders placed or performed during the  hospital encounter of 06/20/23 (from the past 24 hour(s))  Glucose, capillary     Status: Abnormal   Collection Time: 07/02/23  5:21 PM  Result Value Ref Range   Glucose-Capillary 159 (H) 70 - 99 mg/dL   Comment 1 Notify RN    Comment 2 Document in Chart   Glucose, capillary     Status: Abnormal   Collection Time: 07/02/23  8:04 PM  Result Value Ref Range   Glucose-Capillary 139 (H) 70 - 99 mg/dL  Glucose, capillary     Status: Abnormal   Collection Time: 07/02/23 11:06 PM  Result Value Ref Range   Glucose-Capillary 156 (H) 70 - 99 mg/dL  Glucose, capillary     Status: Abnormal   Collection Time: 07/03/23  3:08 AM  Result Value Ref Range   Glucose-Capillary 161 (H) 70 - 99 mg/dL  Comprehensive metabolic panel     Status: Abnormal   Collection Time: 07/03/23  3:54 AM  Result Value Ref Range   Sodium 134 (L) 135 - 145 mmol/L   Potassium 3.6 3.5 - 5.1 mmol/L   Chloride 100 98 - 111 mmol/L   CO2 25 22 - 32 mmol/L   Glucose, Bld 161 (H) 70 - 99 mg/dL   BUN 13 6 - 20 mg/dL   Creatinine, Ser 4.25 0.44 - 1.00 mg/dL   Calcium 8.2 (L) 8.9 - 10.3 mg/dL   Total Protein 6.4 (L) 6.5 - 8.1 g/dL   Albumin 2.4 (L) 3.5 - 5.0 g/dL   AST 13 (L) 15 - 41 U/L   ALT 12 0 - 44 U/L   Alkaline Phosphatase 79 38 - 126 U/L   Total Bilirubin 0.9 0.3 - 1.2 mg/dL   GFR, Estimated >95 >63 mL/min   Anion gap 9 5 - 15  Magnesium     Status: None   Collection Time: 07/03/23  3:54 AM  Result Value Ref Range   Magnesium 2.0 1.7 - 2.4 mg/dL  Phosphorus     Status: None   Collection Time: 07/03/23  3:54 AM  Result Value Ref Range   Phosphorus 2.6 2.5 - 4.6 mg/dL  CBC     Status: Abnormal   Collection Time: 07/03/23  3:54 AM  Result Value Ref Range   WBC 20.2 (H) 4.0 - 10.5 K/uL   RBC 3.38 (L) 3.87 - 5.11 MIL/uL   Hemoglobin 8.1 (L) 12.0 - 15.0 g/dL   HCT 87.5 (L) 64.3 - 32.9 %   MCV 81.7 80.0 - 100.0 fL   MCH 24.0 (L) 26.0 - 34.0 pg   MCHC 29.3 (L) 30.0 - 36.0 g/dL   RDW 51.8 (H) 84.1 - 66.0 %    Platelets  295 150 - 400 K/uL   nRBC 0.0 0.0 - 0.2 %  Glucose, capillary     Status: Abnormal   Collection Time: 07/03/23  7:51 AM  Result Value Ref Range   Glucose-Capillary 147 (H) 70 - 99 mg/dL   Comment 1 Notify RN   Glucose, capillary     Status: Abnormal   Collection Time: 07/03/23 11:46 AM  Result Value Ref Range   Glucose-Capillary 134 (H) 70 - 99 mg/dL   Comment 1 Notify RN     I have reviewed pertinent nursing notes, vitals, labs, and images as necessary. I have ordered labwork to follow up on as indicated.  I have reviewed the last notes from staff over past 24 hours. I have discussed patient's care plan and test results with nursing staff, CM/SW, and other staff as appropriate.  Time spent: Greater than 50% of the 55 minute visit was spent in counseling/coordination of care for the patient as laid out in the A&P.   LOS: 13 days   Lewie Chamber, MD [facility] 07/03/2023, 4:17 PM

## 2023-07-03 NOTE — Plan of Care (Signed)

## 2023-07-04 DIAGNOSIS — C787 Secondary malignant neoplasm of liver and intrahepatic bile duct: Secondary | ICD-10-CM

## 2023-07-04 DIAGNOSIS — K56609 Unspecified intestinal obstruction, unspecified as to partial versus complete obstruction: Secondary | ICD-10-CM | POA: Diagnosis not present

## 2023-07-04 DIAGNOSIS — C786 Secondary malignant neoplasm of retroperitoneum and peritoneum: Secondary | ICD-10-CM | POA: Diagnosis not present

## 2023-07-04 DIAGNOSIS — D509 Iron deficiency anemia, unspecified: Secondary | ICD-10-CM | POA: Diagnosis not present

## 2023-07-04 DIAGNOSIS — C179 Malignant neoplasm of small intestine, unspecified: Secondary | ICD-10-CM | POA: Diagnosis not present

## 2023-07-04 DIAGNOSIS — C189 Malignant neoplasm of colon, unspecified: Secondary | ICD-10-CM | POA: Diagnosis not present

## 2023-07-04 DIAGNOSIS — K922 Gastrointestinal hemorrhage, unspecified: Secondary | ICD-10-CM | POA: Diagnosis not present

## 2023-07-04 LAB — CBC
HCT: 27.7 % — ABNORMAL LOW (ref 36.0–46.0)
Hemoglobin: 8.2 g/dL — ABNORMAL LOW (ref 12.0–15.0)
MCH: 24.2 pg — ABNORMAL LOW (ref 26.0–34.0)
MCHC: 29.6 g/dL — ABNORMAL LOW (ref 30.0–36.0)
MCV: 81.7 fL (ref 80.0–100.0)
Platelets: 368 10*3/uL (ref 150–400)
RBC: 3.39 MIL/uL — ABNORMAL LOW (ref 3.87–5.11)
RDW: 26.5 % — ABNORMAL HIGH (ref 11.5–15.5)
WBC: 18.5 10*3/uL — ABNORMAL HIGH (ref 4.0–10.5)
nRBC: 0 % (ref 0.0–0.2)

## 2023-07-04 LAB — BASIC METABOLIC PANEL
Anion gap: 9 (ref 5–15)
BUN: 13 mg/dL (ref 6–20)
CO2: 24 mmol/L (ref 22–32)
Calcium: 8.1 mg/dL — ABNORMAL LOW (ref 8.9–10.3)
Chloride: 101 mmol/L (ref 98–111)
Creatinine, Ser: 0.72 mg/dL (ref 0.44–1.00)
GFR, Estimated: >60 mL/min (ref >60)
Glucose, Bld: 159 mg/dL — ABNORMAL HIGH (ref 70–99)
Potassium: 3.7 mmol/L (ref 3.5–5.1)
Sodium: 134 mmol/L — ABNORMAL LOW (ref 135–145)

## 2023-07-04 LAB — IRON AND TIBC
Iron: 14 ug/dL — ABNORMAL LOW (ref 28–170)
Saturation Ratios: 7 % — ABNORMAL LOW (ref 10.4–31.8)
TIBC: 197 ug/dL — ABNORMAL LOW (ref 250–450)
UIBC: 183 ug/dL

## 2023-07-04 LAB — PHOSPHORUS: Phosphorus: 3 mg/dL (ref 2.5–4.6)

## 2023-07-04 LAB — GLUCOSE, CAPILLARY
Glucose-Capillary: 120 mg/dL — ABNORMAL HIGH (ref 70–99)
Glucose-Capillary: 142 mg/dL — ABNORMAL HIGH (ref 70–99)
Glucose-Capillary: 146 mg/dL — ABNORMAL HIGH (ref 70–99)
Glucose-Capillary: 147 mg/dL — ABNORMAL HIGH (ref 70–99)
Glucose-Capillary: 157 mg/dL — ABNORMAL HIGH (ref 70–99)
Glucose-Capillary: 164 mg/dL — ABNORMAL HIGH (ref 70–99)

## 2023-07-04 LAB — FERRITIN: Ferritin: 162 ng/mL (ref 11–307)

## 2023-07-04 MED ORDER — BOOST / RESOURCE BREEZE PO LIQD CUSTOM
1.0000 | Freq: Three times a day (TID) | ORAL | Status: DC
  Administered 2023-07-04 – 2023-07-08 (×11): 1 via ORAL

## 2023-07-04 MED ORDER — OXYCODONE HCL 5 MG PO TABS
5.0000 mg | ORAL_TABLET | ORAL | Status: DC | PRN
  Administered 2023-07-05 – 2023-07-09 (×15): 10 mg via ORAL
  Administered 2023-07-10: 5 mg via ORAL
  Administered 2023-07-11 – 2023-07-16 (×11): 10 mg via ORAL
  Filled 2023-07-04 (×27): qty 2
  Filled 2023-07-04: qty 1
  Filled 2023-07-04 (×2): qty 2

## 2023-07-04 MED ORDER — METHOCARBAMOL 1000 MG/10ML IJ SOLN
500.0000 mg | Freq: Four times a day (QID) | INTRAVENOUS | Status: DC | PRN
  Administered 2023-07-06: 500 mg via INTRAVENOUS
  Filled 2023-07-04: qty 500
  Filled 2023-07-04: qty 5

## 2023-07-04 MED ORDER — METHOCARBAMOL 500 MG PO TABS
500.0000 mg | ORAL_TABLET | Freq: Four times a day (QID) | ORAL | Status: DC
  Administered 2023-07-04 (×2): 500 mg via ORAL
  Filled 2023-07-04 (×2): qty 1

## 2023-07-04 MED ORDER — SODIUM CHLORIDE 0.9 % IV SOLN
250.0000 mg | Freq: Every day | INTRAVENOUS | Status: AC
  Administered 2023-07-04 – 2023-07-07 (×4): 250 mg via INTRAVENOUS
  Filled 2023-07-04 (×2): qty 250
  Filled 2023-07-04 (×2): qty 20

## 2023-07-04 MED ORDER — POTASSIUM CHLORIDE 10 MEQ/50ML IV SOLN
10.0000 meq | INTRAVENOUS | Status: AC
  Administered 2023-07-04 (×2): 10 meq via INTRAVENOUS
  Filled 2023-07-04 (×2): qty 50

## 2023-07-04 MED ORDER — SIMETHICONE 80 MG PO CHEW
80.0000 mg | CHEWABLE_TABLET | Freq: Four times a day (QID) | ORAL | Status: DC | PRN
  Administered 2023-07-08: 80 mg via ORAL
  Filled 2023-07-04: qty 1

## 2023-07-04 MED ORDER — TRAVASOL 10 % IV SOLN
INTRAVENOUS | Status: AC
  Filled 2023-07-04: qty 902.4

## 2023-07-04 NOTE — Plan of Care (Signed)
  Problem: Education: Goal: Knowledge of General Education information will improve Description: Including pain rating scale, medication(s)/side effects and non-pharmacologic comfort measures Outcome: Progressing   Problem: Clinical Measurements: Goal: Ability to maintain clinical measurements within normal limits will improve Outcome: Progressing   Problem: Activity: Goal: Risk for activity intolerance will decrease Outcome: Progressing   Problem: Nutrition: Goal: Adequate nutrition will be maintained Outcome: Progressing   Problem: Coping: Goal: Level of anxiety will decrease Outcome: Progressing   Problem: Elimination: Goal: Will not experience complications related to urinary retention Outcome: Progressing   Problem: Safety: Goal: Ability to remain free from injury will improve Outcome: Progressing   Problem: Skin Integrity: Goal: Risk for impaired skin integrity will decrease Outcome: Progressing

## 2023-07-04 NOTE — Progress Notes (Signed)
PHARMACY - TOTAL PARENTERAL NUTRITION CONSULT NOTE   Indication: Small bowel obstruction  Patient Measurements: Height: 5\' 4"  (162.6 cm) Weight: 92.6 kg (204 lb 2.3 oz) IBW/kg (Calculated) : 54.7 TPN AdjBW (KG): 64.5 Body mass index is 35.04 kg/m.  Assessment:  Pharmacy consulted to start TPN on 57 yo [name] diagnosed with small bowel obstruction. S/p exploratory laparotomy on 9/9 and now awaiting return of bowel function.   Recent Labs    07/02/23 0442 07/03/23 0354 07/04/23 0410  NA 136 134* 134*  K 3.9 3.6 3.7  CL 101 100 101  CO2 26 25 24   GLUCOSE 166* 161* 159*  BUN 15 13 13   CREATININE 0.Linda 0.73 1.61  CALCIUM 8.4* 8.2* 8.1*  PHOS  --  2.6 3.0  MG 1.9 2.0  --   ALBUMIN  --  2.4*  --   ALKPHOS  --  79  --   AST  --  13*  --   ALT  --  12  --   BILITOT  --  0.9  --     Glucose / Insulin:  - PMH of pre-DM on semaglutide PTA, A1c 6.5 on 12/05/22 - CBGs 147-161 in the last 24hrs -- continuing to improve since adding back sensitive SSI 9/10 AM (5 units used in last 24hrs) Electrolytes: labs 9/13 - Corrected Ca 9.38 (stable) - Na 134 (slightly low) - Phos WNL - K 3.7 (lower end of normal range) - All other electrolytes WNL Renal:  - SCr WNL. BUN WNL. Hepatic: labs 9/12 - LFTs, Tbili WNL - Albumin 2.4 (low) - Triglycerides 153 (slightly elevated) on 9/9 Intake / Output; MIVF:  - UOP: not recorded in last 24 hrs - Multiple failed NGT placement attempts (last failed attempt 9/9 PM) GI Imaging: - 9/1 Distal small-bowel obstruction with transition point in the right lower quadrant, likely associated with a complex mass in the right lower quadrant. Patient may benefit from nasogastric tube decompression and general surgical consultation. Concern for malignancy.  GI Surgeries / Procedures:  - 9/9 exploratory laparotomy with possible bowel obstruction resection/G-tube placement: Right sided pelvic mass, small bowel resection. Multiple omental and abdominal wall  excisional biopsies.  Central access: PICC placed for TPN 9/2  TPN start date: 9/2  Nutritional Goals: Goal TPN rate is 80 mL/hr (provides ~ 90 g of protein and  1820 kcals per day)  RD Assessment: Estimated Needs Total Energy Estimated Needs: 1700-1900 Total Protein Estimated Needs: 80-90g Total Fluid Estimated Needs: 1.9L/day  Current Nutrition:  NPO TPN  Plan: This am: KCl 10 mEq q1h x 2  At 1800: Continue TPN at goal rate of 80 mL/hr Electrolytes in TPN:  Na 110 mEq/L - increased K 45 mEq/L Ca 61mEq/L Mg 3 mEq/L Phos 7 mmol/L  Cl:Ac 1:2 Add standard MVI and trace elements to TPN Thiamine 100 mg IV every day x 5 days completed 9/7 Continue sensitive SSI q4hrs Monitor TPN labs on Mon/Thurs, BMP qday x 2    Thank you for allowing pharmacy to be a part of this patient's care.  Selinda Eon, PharmD, BCPS Clinical Pharmacist [place] 07/04/2023 10:01 AM

## 2023-07-04 NOTE — Progress Notes (Addendum)
Linda [name]   DOB:Feb 05, 1966   WG#:[id number]   VHQ#:[id number]  Med/onc follow up   Subjective: Pt is tolerating liquid diet well, no nausea.  She has not had a few bowel movements.  No other new complaints.   Objective:  Vitals:   07/04/23 0429 07/04/23 1213  BP: 132/66 129/64  Pulse: 82 82  Resp: 17 20  Temp: 98.5 F (36.9 C) 98.7 F (37.1 C)  SpO2: 97% 98%    Body mass index is 35.04 kg/m.  Intake/Output Summary (Last 24 hours) at 07/04/2023 1758 Last data filed at 07/04/2023 1100 Gross per 24 hour  Intake 1268.62 ml  Output 2 ml  Net 1266.62 ml     Sclerae unicteric  Oropharynx clear  MSK no focal spinal tenderness, no peripheral edema  Neuro nonfocal    CBG (last 3)  Recent Labs    07/04/23 0812 07/04/23 1139 07/04/23 1622  GLUCAP 157* 164* 146*     Labs:    Urine Studies No results for input(s): "UHGB", "CRYS" in the last 72 hours.  Invalid input(s): "UACOL", "UAPR", "USPG", "UPH", "UTP", "UGL", "UKET", "UBIL", "UNIT", "UROB", "ULEU", "UEPI", "UWBC", "URBC", "UBAC", "CAST", "UCOM", "BILUA"  Basic Metabolic Panel: Recent Labs  Lab 06/30/23 0322 06/30/23 1255 07/01/23 0515 07/02/23 0442 07/03/23 0354 07/04/23 0410  NA 134* 110* 135 136 134* 134*  K 3.9 3.3* 4.9 3.9 3.6 3.7  CL 101  --  102 101 100 101  CO2 26  --  25 26 25 24   GLUCOSE 151*  --  277* 166* 161* 159*  BUN 14  --  19 15 13 13   CREATININE 0.68  --  0.90 0.68 0.73 0.72  CALCIUM 8.5*  --  8.4* 8.4* 8.2* 8.1*  MG 2.1  --   --  1.9 2.0  --   PHOS 3.9  --   --   --  2.6 3.0   GFR Estimated Creatinine Clearance: 87.7 mL/min (by C-G formula based on SCr of 0.72 mg/dL). Liver Function Tests: Recent Labs  Lab 06/30/23 0322 07/01/23 0515 07/03/23 0354  AST 14* 15 13*  ALT 13 11 12   ALKPHOS 78 67 79  BILITOT 0.5 1.8* 0.9  PROT 7.2 6.4* 6.4*  ALBUMIN 3.1* 2.7* 2.4*   No results for input(s): "LIPASE", "AMYLASE" in the last 168 hours. No results for input(s): "AMMONIA" in  the last 168 hours. Coagulation profile No results for input(s): "INR", "PROTIME" in the last 168 hours.  CBC: Recent Labs  Lab 06/30/23 0322 06/30/23 1255 07/01/23 0515 07/02/23 0442 07/03/23 0354 07/04/23 0410  WBC 10.4  --  18.6* 19.4* 20.2* 18.5*  HGB 7.6* 8.5* 9.5* 7.3* 8.1* 8.2*  HCT 26.7* 25.0* 33.3* 25.0* 27.6* 27.7*  MCV 79.7*  --  80.8 80.4 81.7 81.7  PLT 361  --  370 299 295 368   Cardiac Enzymes: No results for input(s): "CKTOTAL", "CKMB", "CKMBINDEX", "TROPONINI" in the last 168 hours. BNP: Invalid input(s): "POCBNP" CBG: Recent Labs  Lab 07/03/23 2310 07/04/23 0339 07/04/23 0812 07/04/23 1139 07/04/23 1622  GLUCAP 128* 147* 157* 164* 146*   D-Dimer No results for input(s): "DDIMER" in the last 72 hours. Hgb A1c No results for input(s): "HGBA1C" in the last 72 hours. Lipid Profile No results for input(s): "CHOL", "HDL", "LDLCALC", "TRIG", "CHOLHDL", "LDLDIRECT" in the last 72 hours. Thyroid function studies No results for input(s): "TSH", "T4TOTAL", "T3FREE", "THYROIDAB" in the last 72 hours.  Invalid input(s): "FREET3" Anemia work up [employer]  07/04/23 0410  FERRITIN 162  TIBC 197*  IRON 14*   Microbiology No results found for this or any previous visit (from the past 240 hour(s)).    Studies:  MR LIVER W WO CONTRAST  Result Date: 07/03/2023 CLINICAL DATA:  Liver lesion.  History of colon cancer. EXAM: MRI ABDOMEN WITHOUT AND WITH CONTRAST TECHNIQUE: Multiplanar multisequence MR imaging of the abdomen was performed both before and after the administration of intravenous contrast. CONTRAST:  9mL GADAVIST GADOBUTROL 1 MMOL/ML IV SOLN COMPARISON:  CT chest 07/03/2023. abdomen pelvis CT 06/22/2023. No older exams are available at this time FINDINGS: Lower chest: Trace pleural fluid.  Please see prior chest CT scan. Hepatobiliary: As seen on the prior CT scan there is a lesion in segment 7 measuring 18 x 16 mm on series 15, image 28. This has a  aggressive features, target like appearance with mild bright T2 signal, low T1 signal and some heterogeneous enhancement. Lesion also shows abnormal diffusion and worrisome for metastatic deposit or other aggressive lesion. There are no other parenchymal liver lesions identified at this time. Patent portal vein. No biliary ductal dilatation. Gallbladder is distended. There is some layering sludge in the gallbladder or small stones. Pancreas: No mass, inflammatory changes, or other parenchymal abnormality identified. Spleen:  Within normal limits in size and appearance. Adrenals/Urinary Tract: Adrenal glands are preserved. No enhancing mass or collecting system dilatation. There is some mild atrophy along the lower aspect of the right kidney. No restricted diffusion in the kidney. Stomach/Bowel: As seen on CT scan there are some dilated fluid-filled loops of small bowel left midabdomen. The visualized colon is nondilated. The stomach is nondilated. Vascular/Lymphatic: Normal caliber aorta and IVC. There are several small less than 1 cm in size retroperitoneal and mesenteric nodes. Not pathologic by size criteria but more numerous than usually seen. Other: Scattered ascites. Several mesenteric nodules identified. This includes the central mesenteric lesion seen previously immediately anterior to the third portion of the duodenal on the right side measuring 2.9 by 2.6 cm. Several other areas identified as well. Again these were seen on the prior CT. Some of the lesions could be mesenteric lymph nodes versus peritoneal nodules. The larger complex mass in the pelvis is not included in the imaging field on today's examination for the abdomen Musculoskeletal: Curvature of the spine. Mild degenerative changes. Anasarca. IMPRESSION: Solitary segment 7 hepatic metastasis.  Target like lesion. Peritoneal nodules identified as seen on prior examination with the ascites. Please correlate for peritoneal spread of disease on the  prior CT scan. Persistent dilatation of the loops of small bowel in the midabdomen. Electronically Signed   By: Karen Kays M.D.   On: 07/03/2023 15:33   CT CHEST WO CONTRAST  Result Date: 07/03/2023 CLINICAL DATA:  Colon cancer staging * Tracking Code: BO * EXAM: CT CHEST WITHOUT CONTRAST TECHNIQUE: Multidetector CT imaging of the chest was performed following the standard protocol without IV contrast. RADIATION DOSE REDUCTION: This exam was performed according to the departmental dose-optimization program which includes automated exposure control, adjustment of the mA and/or kV according to patient size and/or use of iterative reconstruction technique. COMPARISON:  CT abdomen pelvis, 06/22/2023 FINDINGS: Cardiovascular: Right upper extremity PICC. Normal heart size. No pericardial effusion. Mediastinum/Nodes: No enlarged mediastinal, hilar, or axillary lymph nodes. Thyroid gland, trachea, and esophagus demonstrate no significant findings. Lungs/Pleura: Bandlike scarring and atelectasis of the dependent right upper lobe (series 8, image 45) and dependent right lower lobe (series 8, image 81).  Mild, bandlike scarring of the left lung base. No pleural effusion or pneumothorax. Upper Abdomen: No acute abnormality. Previously described hypodense liver lesion is not clearly assessed on this noncontrast examination of the chest. Musculoskeletal: No chest wall abnormality. No acute osseous findings. IMPRESSION: 1. No evidence of metastatic disease in the chest. 2. Bandlike scarring and atelectasis of the dependent right upper lobe and dependent right lower lobe. Mild, bandlike scarring of the left lung base. Electronically Signed   By: Jearld Lesch M.D.   On: 07/03/2023 14:07    Assessment: 57 y.o. female with past medical history of hypertension and prediabetes, obesity, presented with intermittent abdominal pain and nausea for the past one year.    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  She unfortunately has an oligo liver metastasis, which does not change her staging, but she is probably not a candidate for HIPEC surgery because of her liver metastasis.  Fortunately her liver metastasis is small and there is only one, we will see how she responds to systemic therapy, and reevaluate her candidacy for curative surgery and procedures. -General Surgery to will place her port in next 1-2 weeks, OK to do outpt if not feasible before discharge. -I have requested medical testing on her surgical sample -If she is able to eat adequately, hopefully she does not need TPN on discharge. If she does then she needs port placement before discharge.  -Lab reviewed, ferritin is back to normal, however her serum iron and saturation are still low, she has received 500mg  ferric gluconate, please give additional 500mg  in next 3-4 days. -I will see her back in 1-2 week after discharge.    Malachy Mood, MD 07/04/2023

## 2023-07-04 NOTE — Plan of Care (Signed)

## 2023-07-04 NOTE — Progress Notes (Signed)
Progress Note    Linda [name]   GEX:[id number]  DOB: 01-21-1966  DOA: 06/20/2023     14 PCP: Arnette Felts, [name]  Initial CC: dark stools, abdominal pain  Hospital Course: Linda [name] is a 57 yo female with PMH anemia, HTN, pre-DM who presented with abdominal pain.  She recently underwent EGD on 05/08/2023.  She was started on H. pylori treatment after procedure.  She completed treatment course approximately 2 weeks prior to admission. She has also been on oral iron and having dark stools. Due to worsening weakness and ongoing abdominal discomfort, she presented to the ER for further evaluation. Hemoglobin was found to be 5.9 g/dL.  She was admitted for blood transfusion and GI evaluation. She had recent colonoscopy and upper endoscopy performed June 2023.  She was not recommended for repeat scopes.  RUQ ultrasound showed hepatic steatosis and hypoechoic region in the right lobe liver.  Due to abdominal pain on admission, further workup commenced with CT abdomen/pelvis in case of gallbladder pathology. CT was notable for multiple findings.  There was a large mass measuring 4.7 x 3 cm noted in the right lower quadrant and also large heterogenous mass superior to the cervix measuring 7.9 x 6.1 cm. Multiple dilated loops of bowel concerning for compression/obstruction were also appreciated. She also has a recent history over the past year of dysfunctional uterine bleeding and thickened endometrium noted on outpatient ultrasound and again seen on CT.  She had declined outpatient endometrial biopsy.  Due to these findings, further workup commenced with consulting surgery, oncology, and gyn-oncology. Tumor markers ordered as well.  On 9/9, she ultimately underwent ex-lap with distal small bowel resection with primary anastomosis along with excisional biopsy of pelvic, multiple abdominal wall and omental masses.  Interval History:  No further bowel movement since yesterday.  Endorses very small  amount of flatus.  No nausea or vomiting and taking in small amount of clear liquids which she seems to be tolerating okay.  Diet further advanced some today per surgery. Continues to ambulate some, encouraged her to continue to do this.  Assessment and Plan: * Upper GI bleed-resolved as of 07/03/2023 - Possibly slow/chronic blood loss from underlying "chronic inactive gastritis" noted on EGD from 05/08/2023.  No H. pylori organisms were noted on immunohistochemical stain but due to pattern of inflammation, she was started on H. pylori treatment which she completed approximately 2 weeks prior to admission -Stools were melanotic in appearance but she states she has also been on oral iron daily -Hemoglobin certainly low on admission, 5.9 g/dL.   - now also s/p ex-lap with presumed some blood loss as expected - will continue to trend H/H and transfuse as necessary - no further scopes per GI at this time  Colon adenocarcinoma (HCC) - per CT there is 4.7 x 3 cm mass in RLL and mass superior to cervix 7.9 x 6.1 cm. Known thickened endometrium again seen and recent hx DUB that was being monitored outpatient.  - initial concern was for GYN malignancy; this has now been ruled out after ex-lap and pathology results peritoneal nodule needle core biopsy from 06/24/2023; endometrial biopsy also negative from 06/24/2023 -Patient underwent ex lap with distal small bowel resection with primary anastomosis, excisional biopsy of pelvic, multiple abdominal wall and omental masses on 06/30/2023 - general surgery and GYN-onc following - oncology also now on board and following for further outpatient plans  SBO (small bowel obstruction) (HCC) - Presented with distended abdomen and decreased bowel  sounds.  Initially suspected constipation contributing but after CT, concern is now for underlying obstruction which may be due to mass effect - general surgery following as well - now s/p ex-lap with small bowel resection with  primary anastomosis and excisional biopsy of pelvic, multi abdominal wall, and omental masses on 06/30/2023 -continue TPN for now; will eventually wean - had a BM 9/12, still minimal flatus; diet advanced to FLD per surgery today (monitor for tolerance, intake still low); continue bowel regimen  Microcytic anemia - Multifactorial etiology at this point.  Overt iron deficiency on admission, newly diagnosed malignancy, and some expected blood loss from recent surgery on 06/30/2023 - Hgb 8.1 g/dL this morning  -Still needs DVT prophylaxis with HSQ given high risk of hypercoagulability/VTE; also needs Khorana score (currently at least 3 points, high risk) calculated prior to discharge and/or before treatment most likely   IDA (iron deficiency anemia) - on admission: iron labs: Iron 9, sat ratio 2%, TIBC 444. Ferritin 8 - s/p 4 units PRBC so far since admission and received 500 mg Ferrlecit on admission - repeat iron studies on 9/13 still low (mild improvement). Will give further repletion with Ferrlecit x 4 days  Type 2 diabetes mellitus with obesity (HCC) - Last A1c 6.5% on 12/05/2022 - SSI ordered in setting of TPN use   Essential hypertension - hold ARB in setting of above - PRN meds to be added; can make scheduled if needed   Old records reviewed in assessment of this patient  Antimicrobials:   DVT prophylaxis:  heparin injection 5,000 Units Start: 07/01/23 1000 SCDs Start: 06/20/23 2059   Code Status:   Code Status: Full Code  Mobility Assessment (Last 72 Hours)     Mobility Assessment     Row Name 07/04/23 0845 07/03/23 2100 07/03/23 0948 07/02/23 2100 07/02/23 0745   Does patient have an order for bedrest or is patient medically unstable No - Continue assessment No - Continue assessment No - Continue assessment No - Continue assessment No - Continue assessment   What is the highest level of mobility based on the progressive mobility assessment? Level 5 (Walks with assist in  room/hall) - Balance while stepping forward/back and can walk in room with assist - Complete Level 5 (Walks with assist in room/hall) - Balance while stepping forward/back and can walk in room with assist - Complete Level 5 (Walks with assist in room/hall) - Balance while stepping forward/back and can walk in room with assist - Complete Level 5 (Walks with assist in room/hall) - Balance while stepping forward/back and can walk in room with assist - Complete Level 6 (Walks independently in room and hall) - Balance while walking in room without assist - Complete    Row Name 07/01/23 1945           Does patient have an order for bedrest or is patient medically unstable No - Continue assessment       What is the highest level of mobility based on the progressive mobility assessment? Level 6 (Walks independently in room and hall) - Balance while walking in room without assist - Complete                Barriers to discharge: None Disposition Plan: Home Status is: Inpatient  Objective: Blood pressure 129/64, pulse 82, temperature 98.7 F (37.1 C), temperature source Oral, resp. rate 20, height 5\' 4"  (1.626 m), weight 92.6 kg, SpO2 98%.  Examination:  Physical Exam Constitutional:  Appearance: Normal appearance.  HENT:     Head: Normocephalic and atraumatic.     Mouth/Throat:     Mouth: Mucous membranes are moist.  Eyes:     Extraocular Movements: Extraocular movements intact.  Cardiovascular:     Rate and Rhythm: Regular rhythm.  Pulmonary:     Effort: Pulmonary effort is normal. No respiratory distress.     Breath sounds: Normal breath sounds. No wheezing.  Abdominal:     General: A surgical scar is present. Bowel sounds are decreased. There is no distension.     Palpations: Abdomen is soft.     Tenderness: There is no abdominal tenderness.     Comments: BS present more in the upper quadrants compared to lowers  Musculoskeletal:        General: Normal range of motion.      Cervical back: Normal range of motion and neck supple.  Skin:    General: Skin is warm and dry.  Neurological:     General: No focal deficit present.     Mental Status: She is alert.  Psychiatric:        Mood and Affect: Mood normal.      Consultants:  GI General surgery Gyn-onc Oncology IR  Procedures:  06/30/23: Exploratory laparotomy, distal small bowel resection with primary anastomosis, excisional biopsy of pelvic, multiple abdominal wall and omental masses   Data Reviewed: Results for orders placed or performed during the hospital encounter of 06/20/23 (from the past 24 hour(s))  Glucose, capillary     Status: None   Collection Time: 07/03/23  4:23 PM  Result Value Ref Range   Glucose-Capillary 86 70 - 99 mg/dL  Glucose, capillary     Status: Abnormal   Collection Time: 07/03/23  7:43 PM  Result Value Ref Range   Glucose-Capillary 140 (H) 70 - 99 mg/dL  Glucose, capillary     Status: Abnormal   Collection Time: 07/03/23 11:10 PM  Result Value Ref Range   Glucose-Capillary 128 (H) 70 - 99 mg/dL  Glucose, capillary     Status: Abnormal   Collection Time: 07/04/23  3:39 AM  Result Value Ref Range   Glucose-Capillary 147 (H) 70 - 99 mg/dL  CBC     Status: Abnormal   Collection Time: 07/04/23  4:10 AM  Result Value Ref Range   WBC 18.5 (H) 4.0 - 10.5 K/uL   RBC 3.39 (L) 3.87 - 5.11 MIL/uL   Hemoglobin 8.2 (L) 12.0 - 15.0 g/dL   HCT 16.1 (L) 09.6 - 04.5 %   MCV 81.7 80.0 - 100.0 fL   MCH 24.2 (L) 26.0 - 34.0 pg   MCHC 29.6 (L) 30.0 - 36.0 g/dL   RDW 40.9 (H) 81.1 - 91.4 %   Platelets 368 150 - 400 K/uL   nRBC 0.0 0.0 - 0.2 %  Basic metabolic panel     Status: Abnormal   Collection Time: 07/04/23  4:10 AM  Result Value Ref Range   Sodium 134 (L) 135 - 145 mmol/L   Potassium 3.7 3.5 - 5.1 mmol/L   Chloride 101 98 - 111 mmol/L   CO2 24 22 - 32 mmol/L   Glucose, Bld 159 (H) 70 - 99 mg/dL   BUN 13 6 - 20 mg/dL   Creatinine, Ser 7.82 0.44 - 1.00 mg/dL   Calcium  8.1 (L) 8.9 - 10.3 mg/dL   GFR, Estimated >95 >62 mL/min   Anion gap 9 5 - 15  Phosphorus  Status: None   Collection Time: 07/04/23  4:10 AM  Result Value Ref Range   Phosphorus 3.0 2.5 - 4.6 mg/dL  Ferritin     Status: None   Collection Time: 07/04/23  4:10 AM  Result Value Ref Range   Ferritin 162 11 - 307 ng/mL  Iron and TIBC     Status: Abnormal   Collection Time: 07/04/23  4:10 AM  Result Value Ref Range   Iron 14 (L) 28 - 170 ug/dL   TIBC 130 (L) 865 - 784 ug/dL   Saturation Ratios 7 (L) 10.4 - 31.8 %   UIBC 183 ug/dL  Glucose, capillary     Status: Abnormal   Collection Time: 07/04/23  8:12 AM  Result Value Ref Range   Glucose-Capillary 157 (H) 70 - 99 mg/dL   Comment 1 Notify RN   Glucose, capillary     Status: Abnormal   Collection Time: 07/04/23 11:39 AM  Result Value Ref Range   Glucose-Capillary 164 (H) 70 - 99 mg/dL   Comment 1 Notify RN     I have reviewed pertinent nursing notes, vitals, labs, and images as necessary. I have ordered labwork to follow up on as indicated.  I have reviewed the last notes from staff over past 24 hours. I have discussed patient's care plan and test results with nursing staff, CM/SW, and other staff as appropriate.  Time spent: Greater than 50% of the 55 minute visit was spent in counseling/coordination of care for the patient as laid out in the A&P.   LOS: 14 days   Lewie Chamber, MD [facility] 07/04/2023, 2:44 PM

## 2023-07-04 NOTE — Progress Notes (Signed)
Patient ID: Linda [name], female   DOB: 04/28/1966, 57 y.o.   MRN: [id number] [facility] Surgery Progress Note  4 Days Post-Op  Subjective: CC-  Started passing gas last night. 1 BM yesterday morning. Still feels bloated. States that she has had some nausea with pain medication, but no emesis. Tolerating very small amount of liquids.  Objective: Vital signs in last 24 hours: Temp:  [98 F (36.7 C)-99.3 F (37.4 C)] 98.5 F (36.9 C) (09/13 0429) Pulse Rate:  [79-86] 82 (09/13 0429) Resp:  [14-18] 17 (09/13 0429) BP: (122-151)/(63-71) 132/66 (09/13 0429) SpO2:  [97 %-100 %] 97 % (09/13 0429) Last BM Date : 06/25/23  Intake/Output from previous day: 09/12 0701 - 09/13 0700 In: 1093.6 [I.V.:643.6; IV Piggyback:450] Out: -  Intake/Output this shift: No intake/output data recorded.  PE: Gen:  Alert, NAD, pleasant Card:  RRR Pulm:  rate and effort normal Abd: distended but soft, mild tenderness over incision, hypoactive bowel sounds, incision cdi with staples present  Lab Results:  Recent Labs    07/03/23 0354 07/04/23 0410  WBC 20.2* 18.5*  HGB 8.1* 8.2*  HCT 27.6* 27.7*  PLT 295 368   BMET Recent Labs    07/03/23 0354 07/04/23 0410  NA 134* 134*  K 3.6 3.7  CL 100 101  CO2 25 24  GLUCOSE 161* 159*  BUN 13 13  CREATININE 0.73 0.72  CALCIUM 8.2* 8.1*   PT/INR No results for input(s): "LABPROT", "INR" in the last 72 hours. CMP     Component Value Date/Time   NA 134 (L) 07/04/2023 0410   NA 141 05/16/2022 1044   K 3.7 07/04/2023 0410   CL 101 07/04/2023 0410   CO2 24 07/04/2023 0410   GLUCOSE 159 (H) 07/04/2023 0410   BUN 13 07/04/2023 0410   BUN 9 05/16/2022 1044   CREATININE 0.72 07/04/2023 0410   CALCIUM 8.1 (L) 07/04/2023 0410   PROT 6.4 (L) 07/03/2023 0354   PROT 7.8 05/16/2022 1044   ALBUMIN 2.4 (L) 07/03/2023 0354   ALBUMIN 4.4 05/16/2022 1044   AST 13 (L) 07/03/2023 0354   ALT 12 07/03/2023 0354   ALKPHOS 79 07/03/2023 0354    BILITOT 0.9 07/03/2023 0354   BILITOT <0.2 05/16/2022 1044   GFRNONAA >60 07/04/2023 0410   Lipase     Component Value Date/Time   LIPASE 35 06/20/2023 1436       Studies/Results: MR LIVER W WO CONTRAST  Result Date: 07/03/2023 CLINICAL DATA:  Liver lesion.  History of colon cancer. EXAM: MRI ABDOMEN WITHOUT AND WITH CONTRAST TECHNIQUE: Multiplanar multisequence MR imaging of the abdomen was performed both before and after the administration of intravenous contrast. CONTRAST:  9mL GADAVIST GADOBUTROL 1 MMOL/ML IV SOLN COMPARISON:  CT chest 07/03/2023. abdomen pelvis CT 06/22/2023. No older exams are available at this time FINDINGS: Lower chest: Trace pleural fluid.  Please see prior chest CT scan. Hepatobiliary: As seen on the prior CT scan there is a lesion in segment 7 measuring 18 x 16 mm on series 15, image 28. This has a aggressive features, target like appearance with mild bright T2 signal, low T1 signal and some heterogeneous enhancement. Lesion also shows abnormal diffusion and worrisome for metastatic deposit or other aggressive lesion. There are no other parenchymal liver lesions identified at this time. Patent portal vein. No biliary ductal dilatation. Gallbladder is distended. There is some layering sludge in the gallbladder or small stones. Pancreas: No mass, inflammatory changes, or other parenchymal  abnormality identified. Spleen:  Within normal limits in size and appearance. Adrenals/Urinary Tract: Adrenal glands are preserved. No enhancing mass or collecting system dilatation. There is some mild atrophy along the lower aspect of the right kidney. No restricted diffusion in the kidney. Stomach/Bowel: As seen on CT scan there are some dilated fluid-filled loops of small bowel left midabdomen. The visualized colon is nondilated. The stomach is nondilated. Vascular/Lymphatic: Normal caliber aorta and IVC. There are several small less than 1 cm in size retroperitoneal and mesenteric  nodes. Not pathologic by size criteria but more numerous than usually seen. Other: Scattered ascites. Several mesenteric nodules identified. This includes the central mesenteric lesion seen previously immediately anterior to the third portion of the duodenal on the right side measuring 2.9 by 2.6 cm. Several other areas identified as well. Again these were seen on the prior CT. Some of the lesions could be mesenteric lymph nodes versus peritoneal nodules. The larger complex mass in the pelvis is not included in the imaging field on today's examination for the abdomen Musculoskeletal: Curvature of the spine. Mild degenerative changes. Anasarca. IMPRESSION: Solitary segment 7 hepatic metastasis.  Target like lesion. Peritoneal nodules identified as seen on prior examination with the ascites. Please correlate for peritoneal spread of disease on the prior CT scan. Persistent dilatation of the loops of small bowel in the midabdomen. Electronically Signed   By: Karen Kays M.D.   On: 07/03/2023 15:33   CT CHEST WO CONTRAST  Result Date: 07/03/2023 CLINICAL DATA:  Colon cancer staging * Tracking Code: BO * EXAM: CT CHEST WITHOUT CONTRAST TECHNIQUE: Multidetector CT imaging of the chest was performed following the standard protocol without IV contrast. RADIATION DOSE REDUCTION: This exam was performed according to the departmental dose-optimization program which includes automated exposure control, adjustment of the mA and/or kV according to patient size and/or use of iterative reconstruction technique. COMPARISON:  CT abdomen pelvis, 06/22/2023 FINDINGS: Cardiovascular: Right upper extremity PICC. Normal heart size. No pericardial effusion. Mediastinum/Nodes: No enlarged mediastinal, hilar, or axillary lymph nodes. Thyroid gland, trachea, and esophagus demonstrate no significant findings. Lungs/Pleura: Bandlike scarring and atelectasis of the dependent right upper lobe (series 8, image 45) and dependent right lower  lobe (series 8, image 81). Mild, bandlike scarring of the left lung base. No pleural effusion or pneumothorax. Upper Abdomen: No acute abnormality. Previously described hypodense liver lesion is not clearly assessed on this noncontrast examination of the chest. Musculoskeletal: No chest wall abnormality. No acute osseous findings. IMPRESSION: 1. No evidence of metastatic disease in the chest. 2. Bandlike scarring and atelectasis of the dependent right upper lobe and dependent right lower lobe. Mild, bandlike scarring of the left lung base. Electronically Signed   By: Jearld Lesch M.D.   On: 07/03/2023 14:07    Anti-infectives: Anti-infectives (From admission, onward)    Start     Dose/Rate Route Frequency Ordered Stop   06/30/23 0830  ceFAZolin (ANCEF) IVPB 2g/100 mL premix        2 g 200 mL/hr over 30 Minutes Intravenous On call to O.R. 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 Mosetta Putt is following   POD# 4 s/p ex lap, SBR and  biopsy of multiple sites 9/9 Dr. Fredricka Bonine - surgical path pending - Ok for full liquids, advised pt to take this very slowly. Continue TPN - mobilize  - add oral pain medications with IV for breakthrough - Hgb 8.2 from 8.1 and stable, s/p 1u PRBCs 9/11. IV iron ordered by primary team today 9/13 - WBC down 18.5, afebrile. Monitor - will discuss port placement with surgeon next week   FEN: FLD, Boost, TPN VTE: sqh ID: no current abx   - per [facility] -  IDA T2DM HTN    LOS: 14 days    Franne Forts, [facility] Surgery 07/04/2023, 8:50 AM Please see Amion for pager number during day hours 7:00am-4:30pm

## 2023-07-05 DIAGNOSIS — K922 Gastrointestinal hemorrhage, unspecified: Secondary | ICD-10-CM | POA: Diagnosis not present

## 2023-07-05 DIAGNOSIS — C189 Malignant neoplasm of colon, unspecified: Secondary | ICD-10-CM | POA: Diagnosis not present

## 2023-07-05 DIAGNOSIS — K56609 Unspecified intestinal obstruction, unspecified as to partial versus complete obstruction: Secondary | ICD-10-CM | POA: Diagnosis not present

## 2023-07-05 LAB — CBC
HCT: 26.7 % — ABNORMAL LOW (ref 36.0–46.0)
Hemoglobin: 7.7 g/dL — ABNORMAL LOW (ref 12.0–15.0)
MCH: 23.8 pg — ABNORMAL LOW (ref 26.0–34.0)
MCHC: 28.8 g/dL — ABNORMAL LOW (ref 30.0–36.0)
MCV: 82.4 fL (ref 80.0–100.0)
Platelets: 383 10*3/uL (ref 150–400)
RBC: 3.24 MIL/uL — ABNORMAL LOW (ref 3.87–5.11)
RDW: 27.3 % — ABNORMAL HIGH (ref 11.5–15.5)
WBC: 17.5 10*3/uL — ABNORMAL HIGH (ref 4.0–10.5)
nRBC: 0.3 % — ABNORMAL HIGH (ref 0.0–0.2)

## 2023-07-05 LAB — GLUCOSE, CAPILLARY
Glucose-Capillary: 143 mg/dL — ABNORMAL HIGH (ref 70–99)
Glucose-Capillary: 136 mg/dL — ABNORMAL HIGH (ref 70–99)
Glucose-Capillary: 147 mg/dL — ABNORMAL HIGH (ref 70–99)
Glucose-Capillary: 139 mg/dL — ABNORMAL HIGH (ref 70–99)
Glucose-Capillary: 150 mg/dL — ABNORMAL HIGH (ref 70–99)

## 2023-07-05 LAB — BASIC METABOLIC PANEL
Anion gap: 10 (ref 5–15)
BUN: 11 mg/dL (ref 6–20)
CO2: 25 mmol/L (ref 22–32)
Calcium: 8.2 mg/dL — ABNORMAL LOW (ref 8.9–10.3)
Chloride: 101 mmol/L (ref 98–111)
Creatinine, Ser: 0.57 mg/dL (ref 0.44–1.00)
GFR, Estimated: >60 mL/min (ref >60)
Glucose, Bld: 131 mg/dL — ABNORMAL HIGH (ref 70–99)
Potassium: 3.7 mmol/L (ref 3.5–5.1)
Sodium: 136 mmol/L (ref 135–145)

## 2023-07-05 LAB — MAGNESIUM: Magnesium: 1.8 mg/dL (ref 1.7–2.4)

## 2023-07-05 MED ORDER — TRAVASOL 10 % IV SOLN
INTRAVENOUS | Status: AC
  Filled 2023-07-05: qty 902.4

## 2023-07-05 MED ORDER — INSULIN ASPART 100 UNIT/ML IJ SOLN
0.0000 [IU] | Freq: Three times a day (TID) | INTRAMUSCULAR | Status: DC
  Administered 2023-07-05 – 2023-07-06 (×4): 2 [IU] via SUBCUTANEOUS
  Administered 2023-07-06: 3 [IU] via SUBCUTANEOUS
  Administered 2023-07-07 (×2): 2 [IU] via SUBCUTANEOUS

## 2023-07-05 NOTE — Plan of Care (Signed)

## 2023-07-05 NOTE — Progress Notes (Signed)
Progress Note    Linda [name]   GNF:[id number]  DOB: [birth year]  DOA: 06/20/2023     15 PCP: Arnette Felts, [name]  Initial CC: dark stools, abdominal pain  Hospital Course: Linda [name] is a 57 yo female with PMH anemia, HTN, pre-DM who presented with abdominal pain.  She recently underwent EGD on 05/08/2023.  She was started on H. pylori treatment after procedure.  She completed treatment course approximately 2 weeks prior to admission. She has also been on oral iron and having dark stools. Due to worsening weakness and ongoing abdominal discomfort, she presented to the ER for further evaluation. Hemoglobin was found to be 5.9 g/dL.  She was admitted for blood transfusion and GI evaluation. She had recent colonoscopy and upper endoscopy performed June 2023.  She was not recommended for repeat scopes.  RUQ ultrasound showed hepatic steatosis and hypoechoic region in the right lobe liver.  Due to abdominal pain on admission, further workup commenced with CT abdomen/pelvis in case of gallbladder pathology. CT was notable for multiple findings.  There was a large mass measuring 4.7 x 3 cm noted in the right lower quadrant and also large heterogenous mass superior to the cervix measuring 7.9 x 6.1 cm. Multiple dilated loops of bowel concerning for compression/obstruction were also appreciated. She also has a recent history over the past year of dysfunctional uterine bleeding and thickened endometrium noted on outpatient ultrasound and again seen on CT.  She had declined outpatient endometrial biopsy.  Due to these findings, further workup commenced with consulting surgery, oncology, and gyn-oncology. Tumor markers ordered as well.  On 9/9, she ultimately underwent ex-lap with distal small bowel resection with primary anastomosis along with excisional biopsy of pelvic, multiple abdominal wall and omental masses.  Interval History:  States she had another BM since yesterday and some flatus even.  Seems to be slowly improving; she's ambulating well also.  FLD still today and possible further advancement tomorrow and then maybe starting to wean TPN at that time too.   Assessment and Plan: * Upper GI bleed-resolved as of 07/03/2023 - Possibly slow/chronic blood loss from underlying "chronic inactive gastritis" noted on EGD from 05/08/2023.  No H. pylori organisms were noted on immunohistochemical stain but due to pattern of inflammation, she was started on H. pylori treatment which she completed approximately 2 weeks prior to admission -Stools were melanotic in appearance but she states she has also been on oral iron daily -Hemoglobin certainly low on admission, 5.9 g/dL.   - now also s/p ex-lap with presumed some blood loss as expected - will continue to trend H/H and transfuse as necessary - no further scopes per GI at this time  Colon adenocarcinoma (HCC) - per CT there is 4.7 x 3 cm mass in RLL and mass superior to cervix 7.9 x 6.1 cm. Known thickened endometrium again seen and recent hx DUB that was being monitored outpatient.  - initial concern was for GYN malignancy; this has now been ruled out after ex-lap and pathology results peritoneal nodule needle core biopsy from 06/24/2023; endometrial biopsy also negative from 06/24/2023 -Patient underwent ex lap with distal small bowel resection with primary anastomosis, excisional biopsy of pelvic, multiple abdominal wall and omental masses on 06/30/2023 - general surgery and GYN-onc following - oncology also now on board and following for further outpatient plans  SBO (small bowel obstruction) (HCC) - Presented with distended abdomen and decreased bowel sounds.  Initially suspected constipation contributing but after CT, concern is  now for underlying obstruction which may be due to mass effect - general surgery following as well - now s/p ex-lap with small bowel resection with primary anastomosis and excisional biopsy of pelvic, multi abdominal  wall, and omental masses on 06/30/2023 -continue TPN for now; will eventually wean - had a BM 9/12, still minimal flatus; diet advanced to FLD per surgery (monitor for tolerance, intake still low); continue bowel regimen  Microcytic anemia - Multifactorial etiology at this point.  Overt iron deficiency on admission, newly diagnosed malignancy, and some expected blood loss from recent surgery on 06/30/2023 - Hgb 8.1 g/dL this morning  -Still needs DVT prophylaxis with HSQ given high risk of hypercoagulability/VTE; also needs Khorana score (currently at least 3 points, high risk) calculated prior to discharge and/or before treatment most likely   IDA (iron deficiency anemia) - on admission: iron labs: Iron 9, sat ratio 2%, TIBC 444. Ferritin 8 - s/p 4 units PRBC so far since admission and received 500 mg Ferrlecit on admission - repeat iron studies on 9/13 still low (mild improvement). Will give further repletion with Ferrlecit x 4 days  Type 2 diabetes mellitus with obesity (HCC) - Last A1c 6.5% on 12/05/2022 - SSI ordered in setting of TPN use   Essential hypertension - hold ARB in setting of above - PRN meds to be added; can make scheduled if needed   Old records reviewed in assessment of this patient  Antimicrobials:   DVT prophylaxis:  heparin injection 5,000 Units Start: 07/01/23 1000 SCDs Start: 06/20/23 2059   Code Status:   Code Status: Full Code  Mobility Assessment (Last 72 Hours)     Mobility Assessment     Row Name 07/05/23 0800 07/05/23 0430 07/04/23 1925 07/04/23 0845 07/03/23 2100   Does patient have an order for bedrest or is patient medically unstable No - Continue assessment No - Continue assessment No - Continue assessment No - Continue assessment No - Continue assessment   What is the highest level of mobility based on the progressive mobility assessment? Level 6 (Walks independently in room and hall) - Balance while walking in room without assist - Complete  Level 6 (Walks independently in room and hall) - Balance while walking in room without assist - Complete Level 6 (Walks independently in room and hall) - Balance while walking in room without assist - Complete Level 5 (Walks with assist in room/hall) - Balance while stepping forward/back and can walk in room with assist - Complete Level 5 (Walks with assist in room/hall) - Balance while stepping forward/back and can walk in room with assist - Complete    Row Name 07/03/23 0948 07/02/23 2100         Does patient have an order for bedrest or is patient medically unstable No - Continue assessment No - Continue assessment      What is the highest level of mobility based on the progressive mobility assessment? Level 5 (Walks with assist in room/hall) - Balance while stepping forward/back and can walk in room with assist - Complete Level 5 (Walks with assist in room/hall) - Balance while stepping forward/back and can walk in room with assist - Complete               Barriers to discharge: None Disposition Plan: Home Status is: Inpatient  Objective: Blood pressure (!) 158/69, pulse 87, temperature 99.1 F (37.3 C), temperature source Oral, resp. rate 14, height 5\' 4"  (1.626 m), weight 97 kg, SpO2 98%.  Examination:  Physical Exam Constitutional:      Appearance: Normal appearance.  HENT:     Head: Normocephalic and atraumatic.     Mouth/Throat:     Mouth: Mucous membranes are moist.  Eyes:     Extraocular Movements: Extraocular movements intact.  Cardiovascular:     Rate and Rhythm: Regular rhythm.  Pulmonary:     Effort: Pulmonary effort is normal. No respiratory distress.     Breath sounds: Normal breath sounds. No wheezing.  Abdominal:     General: A surgical scar is present. Bowel sounds are decreased. There is no distension.     Palpations: Abdomen is soft.     Tenderness: There is no abdominal tenderness.     Comments: BS present more in the upper quadrants compared to lowers   Musculoskeletal:        General: Normal range of motion.     Cervical back: Normal range of motion and neck supple.  Skin:    General: Skin is warm and dry.  Neurological:     General: No focal deficit present.     Mental Status: She is alert.  Psychiatric:        Mood and Affect: Mood normal.      Consultants:  GI General surgery Gyn-onc Oncology IR  Procedures:  06/30/23: Exploratory laparotomy, distal small bowel resection with primary anastomosis, excisional biopsy of pelvic, multiple abdominal wall and omental masses   Data Reviewed: Results for orders placed or performed during the hospital encounter of 06/20/23 (from the past 24 hour(s))  Glucose, capillary     Status: Abnormal   Collection Time: 07/04/23  4:22 PM  Result Value Ref Range   Glucose-Capillary 146 (H) 70 - 99 mg/dL   Comment 1 Notify RN   Glucose, capillary     Status: Abnormal   Collection Time: 07/04/23  7:43 PM  Result Value Ref Range   Glucose-Capillary 120 (H) 70 - 99 mg/dL  Glucose, capillary     Status: Abnormal   Collection Time: 07/04/23 11:56 PM  Result Value Ref Range   Glucose-Capillary 142 (H) 70 - 99 mg/dL  CBC     Status: Abnormal   Collection Time: 07/05/23  2:50 AM  Result Value Ref Range   WBC 17.5 (H) 4.0 - 10.5 K/uL   RBC 3.24 (L) 3.87 - 5.11 MIL/uL   Hemoglobin 7.7 (L) 12.0 - 15.0 g/dL   HCT 65.7 (L) 84.6 - 96.2 %   MCV 82.4 80.0 - 100.0 fL   MCH 23.8 (L) 26.0 - 34.0 pg   MCHC 28.8 (L) 30.0 - 36.0 g/dL   RDW 95.2 (H) 84.1 - 32.4 %   Platelets 383 150 - 400 K/uL   nRBC 0.3 (H) 0.0 - 0.2 %  Basic metabolic panel     Status: Abnormal   Collection Time: 07/05/23  2:50 AM  Result Value Ref Range   Sodium 136 135 - 145 mmol/L   Potassium 3.7 3.5 - 5.1 mmol/L   Chloride 101 98 - 111 mmol/L   CO2 25 22 - 32 mmol/L   Glucose, Bld 131 (H) 70 - 99 mg/dL   BUN 11 6 - 20 mg/dL   Creatinine, Ser 4.01 0.44 - 1.00 mg/dL   Calcium 8.2 (L) 8.9 - 10.3 mg/dL   GFR, Estimated >02 >72  mL/min   Anion gap 10 5 - 15  Magnesium     Status: None   Collection Time: 07/05/23  2:50 AM  Result  Value Ref Range   Magnesium 1.8 1.7 - 2.4 mg/dL  Glucose, capillary     Status: Abnormal   Collection Time: 07/05/23  4:07 AM  Result Value Ref Range   Glucose-Capillary 143 (H) 70 - 99 mg/dL  Glucose, capillary     Status: Abnormal   Collection Time: 07/05/23  8:09 AM  Result Value Ref Range   Glucose-Capillary 136 (H) 70 - 99 mg/dL  Glucose, capillary     Status: Abnormal   Collection Time: 07/05/23 12:09 PM  Result Value Ref Range   Glucose-Capillary 147 (H) 70 - 99 mg/dL   Comment 1 Notify RN   Glucose, capillary     Status: Abnormal   Collection Time: 07/05/23  2:01 PM  Result Value Ref Range   Glucose-Capillary 139 (H) 70 - 99 mg/dL   Comment 1 Notify RN     I have reviewed pertinent nursing notes, vitals, labs, and images as necessary. I have ordered labwork to follow up on as indicated.  I have reviewed the last notes from staff over past 24 hours. I have discussed patient's care plan and test results with nursing staff, CM/SW, and other staff as appropriate.    LOS: 15 days   Lewie Chamber, MD [facility] 07/05/2023, 4:18 PM

## 2023-07-05 NOTE — Progress Notes (Signed)
Patient ID: Linda [name], female   DOB: January 28, 1966, 57 y.o.   MRN: [id number] [facility] Surgery Progress Note  5 Days Post-Op  Subjective: CC-  Having some BM's. Still feels bloated.  Tolerating some liquids.  Objective: Vital signs in last 24 hours: Temp:  [98.7 F (37.1 C)-99.2 F (37.3 C)] 99.2 F (37.3 C) (09/14 0409) Pulse Rate:  [80-84] 80 (09/14 0409) Resp:  [18-20] 18 (09/14 0409) BP: (129-135)/(61-66) 129/66 (09/14 0409) SpO2:  [96 %-99 %] 96 % (09/14 0409) Weight:  [97 kg] 97 kg (09/14 0500) Last BM Date : 07/04/23  Intake/Output from previous day: 09/13 0701 - 09/14 0700 In: 475 [P.O.:475] Out: 2 [Urine:1; Stool:1] Intake/Output this shift: No intake/output data recorded.  PE: Gen:  Alert, NAD, pleasant Card:  RRR Pulm:  rate and effort normal Abd: distended but soft, mild tenderness over incision, incision cdi with staples and clear dressing present  Lab Results:  Recent Labs    07/04/23 0410 07/05/23 0250  WBC 18.5* 17.5*  HGB 8.2* 7.7*  HCT 27.7* 26.7*  PLT 368 383   BMET Recent Labs    07/04/23 0410 07/05/23 0250  NA 134* 136  K 3.7 3.7  CL 101 101  CO2 24 25  GLUCOSE 159* 131*  BUN 13 11  CREATININE 0.72 0.57  CALCIUM 8.1* 8.2*   PT/INR No results for input(s): "LABPROT", "INR" in the last 72 hours. CMP     Component Value Date/Time   NA 136 07/05/2023 0250   NA 141 05/16/2022 1044   K 3.7 07/05/2023 0250   CL 101 07/05/2023 0250   CO2 25 07/05/2023 0250   GLUCOSE 131 (H) 07/05/2023 0250   BUN 11 07/05/2023 0250   BUN 9 05/16/2022 1044   CREATININE 0.57 07/05/2023 0250   CALCIUM 8.2 (L) 07/05/2023 0250   PROT 6.4 (L) 07/03/2023 0354   PROT 7.8 05/16/2022 1044   ALBUMIN 2.4 (L) 07/03/2023 0354   ALBUMIN 4.4 05/16/2022 1044   AST 13 (L) 07/03/2023 0354   ALT 12 07/03/2023 0354   ALKPHOS 79 07/03/2023 0354   BILITOT 0.9 07/03/2023 0354   BILITOT <0.2 05/16/2022 1044   GFRNONAA >60 07/05/2023 0250   Lipase      Component Value Date/Time   LIPASE 35 06/20/2023 1436       Studies/Results: MR LIVER W WO CONTRAST  Result Date: 07/03/2023 CLINICAL DATA:  Liver lesion.  History of colon cancer. EXAM: MRI ABDOMEN WITHOUT AND WITH CONTRAST TECHNIQUE: Multiplanar multisequence MR imaging of the abdomen was performed both before and after the administration of intravenous contrast. CONTRAST:  9mL GADAVIST GADOBUTROL 1 MMOL/ML IV SOLN COMPARISON:  CT chest 07/03/2023. abdomen pelvis CT 06/22/2023. No older exams are available at this time FINDINGS: Lower chest: Trace pleural fluid.  Please see prior chest CT scan. Hepatobiliary: As seen on the prior CT scan there is a lesion in segment 7 measuring 18 x 16 mm on series 15, image 28. This has a aggressive features, target like appearance with mild bright T2 signal, low T1 signal and some heterogeneous enhancement. Lesion also shows abnormal diffusion and worrisome for metastatic deposit or other aggressive lesion. There are no other parenchymal liver lesions identified at this time. Patent portal vein. No biliary ductal dilatation. Gallbladder is distended. There is some layering sludge in the gallbladder or small stones. Pancreas: No mass, inflammatory changes, or other parenchymal abnormality identified. Spleen:  Within normal limits in size and appearance. Adrenals/Urinary Tract: Adrenal glands  are preserved. No enhancing mass or collecting system dilatation. There is some mild atrophy along the lower aspect of the right kidney. No restricted diffusion in the kidney. Stomach/Bowel: As seen on CT scan there are some dilated fluid-filled loops of small bowel left midabdomen. The visualized colon is nondilated. The stomach is nondilated. Vascular/Lymphatic: Normal caliber aorta and IVC. There are several small less than 1 cm in size retroperitoneal and mesenteric nodes. Not pathologic by size criteria but more numerous than usually seen. Other: Scattered ascites. Several  mesenteric nodules identified. This includes the central mesenteric lesion seen previously immediately anterior to the third portion of the duodenal on the right side measuring 2.9 by 2.6 cm. Several other areas identified as well. Again these were seen on the prior CT. Some of the lesions could be mesenteric lymph nodes versus peritoneal nodules. The larger complex mass in the pelvis is not included in the imaging field on today's examination for the abdomen Musculoskeletal: Curvature of the spine. Mild degenerative changes. Anasarca. IMPRESSION: Solitary segment 7 hepatic metastasis.  Target like lesion. Peritoneal nodules identified as seen on prior examination with the ascites. Please correlate for peritoneal spread of disease on the prior CT scan. Persistent dilatation of the loops of small bowel in the midabdomen. Electronically Signed   By: Karen Kays M.D.   On: 07/03/2023 15:33   CT CHEST WO CONTRAST  Result Date: 07/03/2023 CLINICAL DATA:  Colon cancer staging * Tracking Code: BO * EXAM: CT CHEST WITHOUT CONTRAST TECHNIQUE: Multidetector CT imaging of the chest was performed following the standard protocol without IV contrast. RADIATION DOSE REDUCTION: This exam was performed according to the departmental dose-optimization program which includes automated exposure control, adjustment of the mA and/or kV according to patient size and/or use of iterative reconstruction technique. COMPARISON:  CT abdomen pelvis, 06/22/2023 FINDINGS: Cardiovascular: Right upper extremity PICC. Normal heart size. No pericardial effusion. Mediastinum/Nodes: No enlarged mediastinal, hilar, or axillary lymph nodes. Thyroid gland, trachea, and esophagus demonstrate no significant findings. Lungs/Pleura: Bandlike scarring and atelectasis of the dependent right upper lobe (series 8, image 45) and dependent right lower lobe (series 8, image 81). Mild, bandlike scarring of the left lung base. No pleural effusion or pneumothorax.  Upper Abdomen: No acute abnormality. Previously described hypodense liver lesion is not clearly assessed on this noncontrast examination of the chest. Musculoskeletal: No chest wall abnormality. No acute osseous findings. IMPRESSION: 1. No evidence of metastatic disease in the chest. 2. Bandlike scarring and atelectasis of the dependent right upper lobe and dependent right lower lobe. Mild, bandlike scarring of the left lung base. Electronically Signed   By: Jearld Lesch M.D.   On: 07/03/2023 14:07    Anti-infectives: Anti-infectives (From admission, onward)    Start     Dose/Rate Route Frequency Ordered Stop   06/30/23 0830  ceFAZolin (ANCEF) IVPB 2g/100 mL premix        2 g 200 mL/hr over 30 Minutes Intravenous On call to O.R. 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 Mosetta Putt is following   POD# 5 s/p ex lap, SBR and biopsy of multiple sites 9/9 Dr. Fredricka Bonine - surgical path pending - Ok for full  liquids, advised pt to take this very slowly. Continue TPN - mobilize  - add oral pain medications with IV for breakthrough - Hgb 8.2 from 8.1 and stable, s/p 1u PRBCs 9/11. IV iron ordered by primary team today 9/13 - WBC slowly trending down, afebrile. Monitor - will discuss port placement with surgeon next week   FEN: FLD, Boost, TPN VTE: sqh ID: no current abx   - per TRH -  IDA T2DM HTN    LOS: 15 days    Vanita Panda, MD  Colorectal and General Surgery [facility]

## 2023-07-05 NOTE — Plan of Care (Signed)
Problem: Clinical Measurements: Goal: Will remain free from infection Outcome: Progressing   Problem: Activity: Goal: Risk for activity intolerance will decrease Outcome: Progressing   Problem: Elimination: Goal: Will not experience complications related to bowel motility Outcome: Progressing   Problem: Safety: Goal: Ability to remain free from injury will improve Outcome: Progressing

## 2023-07-05 NOTE — Progress Notes (Signed)
PHARMACY - TOTAL PARENTERAL NUTRITION CONSULT NOTE   Indication: Small bowel obstruction  Patient Measurements: Height: 5\' 4"  (162.6 cm) Weight: 97 kg (213 lb 13.5 oz) IBW/kg (Calculated) : 54.7 TPN AdjBW (KG): 64.5 Body mass index is 36.71 kg/m.  Assessment:  Pharmacy consulted to start TPN on 57 yo female diagnosed with small bowel obstruction. S/p exploratory laparotomy on 9/9 and now awaiting return of bowel function.   Recent Labs    07/03/23 0354 07/04/23 0410 07/05/23 0250  NA 134* 134* 136  K 3.6 3.7 3.7  CL 100 101 101  CO2 25 24 25   GLUCOSE 161* 159* 131*  BUN 13 13 11   CREATININE 0.73 0.72 0.57  CALCIUM 8.2* 8.1* 8.2*  PHOS 2.6 3.0  --   MG 2.0  --  1.8  ALBUMIN 2.4*  --   --   ALKPHOS 79  --   --   AST 13*  --   --   ALT 12  --   --   BILITOT 0.9  --   --     Glucose / Insulin: pre-DM on semaglutide PTA; last A1c 6.5 in Feb 2024 - CBGs mostly well controlled yesterday - 7 units SSI given yesterday; down from previous needs after Decadron on 9/9 Electrolytes: K, Na borderline low but stable; all others stable WNL Renal: SCr, BUN stable WNL; UOP not charted Hepatic: (9/12) LFTs, Tbili WNL - Albumin low and slowly decreasing - Triglycerides slightly elevated (9/9) I/O:  - Multiple failed NGT placement attempts (last failed attempt 9/9 PM) - No mIVF GI Imaging: - 9/1 Distal small-bowel obstruction with transition point in the right lower quadrant, likely associated with a complex mass in the right lower quadrant. Patient may benefit from nasogastric tube decompression and general surgical consultation. Concern for malignancy.  - 9/12 MRI liver: persistently dilated SB loops; possible peritoneal mets; solitary 2 cm metastatic mass in liver GI Surgeries / Procedures:  - 9/9 exploratory laparotomy with possible bowel obstruction resection/G-tube placement: Right sided pelvic mass, small bowel resection. Multiple omental and abdominal wall excisional  biopsies.  Central access: PICC placed for TPN 9/2  TPN start date: 9/2  Nutritional Goals: Goal TPN rate is 80 mL/hr (provides ~ 90 g of protein and  1820 kcals per day)  RD Assessment: Estimated Needs Total Energy Estimated Needs: 1700-1900 Total Protein Estimated Needs: 80-90g Total Fluid Estimated Needs: 1.9L/day  Current Nutrition:  Advanced to FLD 9/13; tolerated ~500 ml yesterday TPN  Plan: At 1800: Continue TPN at goal rate of 80 mL/hr Electrolytes in TPN: increase K, Mg slightly Na 110 mEq/L K 45 >> 60 mEq/L Ca 5 mEq/L Mg 3 >> 5 mEq/L Phos 7 mmol/L  Cl:Ac 1:2 Add standard MVI and trace elements to TPN Adjust SSI to moderate scale q8 hr to minimize fingersticks; can probably d/c monitoring altogether if SSI needs remain low Monitor TPN labs on Mon/Thurs, BMP qday x 2   Thank you for allowing pharmacy to be a part of this patient's care.  Bernadene Person, PharmD, BCPS [phone] 07/05/2023, 9:18 AM

## 2023-07-06 DIAGNOSIS — C189 Malignant neoplasm of colon, unspecified: Secondary | ICD-10-CM | POA: Diagnosis not present

## 2023-07-06 DIAGNOSIS — D509 Iron deficiency anemia, unspecified: Secondary | ICD-10-CM | POA: Diagnosis not present

## 2023-07-06 DIAGNOSIS — C179 Malignant neoplasm of small intestine, unspecified: Secondary | ICD-10-CM | POA: Insufficient documentation

## 2023-07-06 DIAGNOSIS — K56609 Unspecified intestinal obstruction, unspecified as to partial versus complete obstruction: Secondary | ICD-10-CM | POA: Diagnosis not present

## 2023-07-06 LAB — GLUCOSE, CAPILLARY
Glucose-Capillary: 130 mg/dL — ABNORMAL HIGH (ref 70–99)
Glucose-Capillary: 157 mg/dL — ABNORMAL HIGH (ref 70–99)
Glucose-Capillary: 125 mg/dL — ABNORMAL HIGH (ref 70–99)

## 2023-07-06 LAB — BASIC METABOLIC PANEL
Anion gap: 8 (ref 5–15)
BUN: 10 mg/dL (ref 6–20)
CO2: 27 mmol/L (ref 22–32)
Calcium: 8.5 mg/dL — ABNORMAL LOW (ref 8.9–10.3)
Chloride: 101 mmol/L (ref 98–111)
Creatinine, Ser: 0.55 mg/dL (ref 0.44–1.00)
GFR, Estimated: >60 mL/min (ref >60)
Glucose, Bld: 150 mg/dL — ABNORMAL HIGH (ref 70–99)
Potassium: 3.7 mmol/L (ref 3.5–5.1)
Sodium: 136 mmol/L (ref 135–145)

## 2023-07-06 MED ORDER — HYDROMORPHONE HCL 1 MG/ML IJ SOLN: 0.5000 mg | INTRAMUSCULAR | Status: DC | PRN

## 2023-07-06 MED ORDER — TRAVASOL 10 % IV SOLN
INTRAVENOUS | Status: AC
  Filled 2023-07-06: qty 451.2

## 2023-07-06 MED ORDER — HYDROMORPHONE HCL 1 MG/ML IJ SOLN
0.5000 mg | Freq: Four times a day (QID) | INTRAMUSCULAR | Status: DC | PRN
  Administered 2023-07-06 – 2023-07-08 (×7): 0.5 mg via INTRAVENOUS
  Filled 2023-07-06 (×7): qty 0.5

## 2023-07-06 NOTE — Progress Notes (Addendum)
PHARMACY - TOTAL PARENTERAL NUTRITION CONSULT NOTE   Indication: Small bowel obstruction  Patient Measurements: Height: 5\' 4"  (162.6 cm) Weight: 97 kg (213 lb 13.5 oz) IBW/kg (Calculated) : 54.7 TPN AdjBW (KG): 64.5 Body mass index is 36.71 kg/m.  Assessment:  Pharmacy consulted to start TPN on 57 yo female diagnosed with small bowel obstruction. S/p exploratory laparotomy on 9/9 and now awaiting return of bowel function.   9/15: CCS requesting TPN weaned to 1/2 rate  Recent Labs    07/04/23 0410 07/05/23 0250 07/06/23 0416  NA 134* 136 136  K 3.7 3.7 3.7  CL 101 101 101  CO2 24 25 27   GLUCOSE 159* 131* 150*  BUN 13 11 10   CREATININE 0.72 0.57 0.55  CALCIUM 8.1* 8.2* 8.5*  PHOS 3.0  --   --   MG  --  1.8  --     Glucose / Insulin: pre-DM on semaglutide PTA; last A1c 6.5 in Feb 2024 - CBGs all within goal (100-150) - 7 units SSI given yesterday; stable from prior days Electrolytes: K, Na borderline low but stable; all others stable WNL Renal: SCr, BUN stable WNL; UOP not charted Hepatic: (9/12) LFTs, Tbili WNL - Albumin low and slowly decreasing - Triglycerides slightly elevated (9/9) I/O: Multiple failed NGT placement attempts (last failed attempt 9/9 PM) - No mIVF - tolerating FLD (950 ml yesterday) per CCS - Return of bowel function; LBM 9/14 GI Imaging: - 9/1 Distal small-bowel obstruction with transition point in the right lower quadrant, likely associated with a complex mass in the right lower quadrant. - 9/12 MRI liver: persistently dilated SB loops; possible peritoneal mets; solitary 2 cm metastatic mass in liver. GI Surgeries / Procedures:  - 9/9 exploratory laparotomy with possible bowel obstruction resection/G-tube placement: Right sided pelvic mass, small bowel resection. Multiple omental and abdominal wall excisional biopsies.  Central access: PICC placed for TPN 9/2  TPN start date: 9/2  Nutritional Goals: Goal TPN rate is 80 mL/hr (provides ~ 90 g  of protein and  1820 kcals per day)  RD Assessment: Estimated Needs Total Energy Estimated Needs: 1700-1900 Total Protein Estimated Needs: 80-90g Total Fluid Estimated Needs: 1.9L/day  Current Nutrition:  Advancing to Reg diet 9/15 TPN  Plan: At 1800: Reduce TPN to 40 mL/hr per CCS request Electrolytes in TPN: no changes today Na 110 mEq/L K 60 mEq/L Ca 5 mEq/L Mg 5 mEq/L Phos 7 mmol/L  Cl:Ac 1:2 Add standard MVI and trace elements to TPN Chromium on hold d/t critical shortage Continue moderate scale SSI with q8 hr to minimize fingersticks Monitor TPN labs on Mon/Thurs   Thank you for allowing pharmacy to be a part of this patient's care.  Bernadene Person, PharmD, BCPS [phone] 07/06/2023, 8:06 AM

## 2023-07-06 NOTE — Progress Notes (Signed)
Patient ID: Linda [name], female   DOB: 08/08/1966, 57 y.o.   MRN: [id number] [facility] Progress Note  6 Days Post-Op  Subjective: CC-  Having some BM's. Feels less bloated.  Tolerating liquids.  Objective: Vital signs in last 24 hours: Temp:  [99.1 F (37.3 C)-99.9 F (37.7 C)] 99.9 F (37.7 C) (09/15 0502) Pulse Rate:  [85-95] 85 (09/15 0502) Resp:  [14-18] 18 (09/15 0502) BP: (129-158)/(62-69) 129/62 (09/15 0502) SpO2:  [92 %-98 %] 96 % (09/15 0502) Last BM Date : 07/05/23  Intake/Output from previous day: 09/14 0701 - 09/15 0700 In: 3276 [P.O.:1200; I.V.:1806; IV Piggyback:270] Out: -  Intake/Output this shift: No intake/output data recorded.  PE: Gen:  Alert, NAD, pleasant Card:  RRR Pulm:  rate and effort normal Abd: less distended, soft, mild tenderness over incision, incision cdi with staples, SS drainage noted from bottom of the wound  Lab Results:  Recent Labs    07/04/23 0410 07/05/23 0250  WBC 18.5* 17.5*  HGB 8.2* 7.7*  HCT 27.7* 26.7*  PLT 368 383   BMET Recent Labs    07/05/23 0250 07/06/23 0416  NA 136 136  K 3.7 3.7  CL 101 101  CO2 25 27  GLUCOSE 131* 150*  BUN 11 10  CREATININE 0.57 0.55  CALCIUM 8.2* 8.5*   PT/INR No results for input(s): "LABPROT", "INR" in the last 72 hours. CMP     Component Value Date/Time   NA 136 07/06/2023 0416   NA 141 05/16/2022 1044   K 3.7 07/06/2023 0416   CL 101 07/06/2023 0416   CO2 27 07/06/2023 0416   GLUCOSE 150 (H) 07/06/2023 0416   BUN 10 07/06/2023 0416   BUN 9 05/16/2022 1044   CREATININE 0.55 07/06/2023 0416   CALCIUM 8.5 (L) 07/06/2023 0416   PROT 6.4 (L) 07/03/2023 0354   PROT 7.8 05/16/2022 1044   ALBUMIN 2.4 (L) 07/03/2023 0354   ALBUMIN 4.4 05/16/2022 1044   AST 13 (L) 07/03/2023 0354   ALT 12 07/03/2023 0354   ALKPHOS 79 07/03/2023 0354   BILITOT 0.9 07/03/2023 0354   BILITOT <0.2 05/16/2022 1044   GFRNONAA >60 07/06/2023 0416   Lipase     Component  Value Date/Time   LIPASE 35 06/20/2023 1436       Studies/Results: No results found.  Anti-infectives: Anti-infectives (From admission, onward)    Start     Dose/Rate Route Frequency Ordered Stop   06/30/23 0830  ceFAZolin (ANCEF) IVPB 2g/100 mL premix        2 g 200 mL/hr over 30 Minutes Intravenous On call to O.R. 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 Mosetta Putt is following   POD# 6 s/p ex lap, SBR and biopsy of multiple sites 9/9 Dr. Fredricka Bonine - surgical path pending - Ok for Reg diet. 1/2 TPN - mobilize  - add oral pain medications with IV for breakthrough - Hgb stable, s/p 1u PRBCs 9/11. IV iron ordered by primary team today 9/13 - WBC slowly trending down, afebrile. Monitor - will discuss port placement with surgeon next week   FEN: RD, Boost, 1/2 TPN VTE: sqh ID: no current abx   -  per TRH -  IDA T2DM HTN    LOS: 16 days    Vanita Panda, MD  Colorectal and General Surgery [facility]

## 2023-07-06 NOTE — Plan of Care (Signed)
Problem: Clinical Measurements: Goal: Will remain free from infection Outcome: Progressing   Problem: Activity: Goal: Risk for activity intolerance will decrease Outcome: Progressing   Problem: Nutrition: Goal: Adequate nutrition will be maintained Outcome: Progressing   Problem: Coping: Goal: Level of anxiety will decrease Outcome: Progressing   Problem: Elimination: Goal: Will not experience complications related to bowel motility Outcome: Progressing   Problem: Pain Managment: Goal: General experience of comfort will improve Outcome: Progressing   Problem: Safety: Goal: Ability to remain free from injury will improve Outcome: Progressing

## 2023-07-06 NOTE — Progress Notes (Signed)
Progress Note    Linda [name]   UXL:[id number]  DOB: [birth year]  DOA: 06/20/2023     16 PCP: Arnette Felts, [name]  Initial CC: dark stools, abdominal pain  Hospital Course: Linda [name] is a 57 yo female with PMH anemia, HTN, pre-DM who presented with abdominal pain.  She recently underwent EGD on 05/08/2023.  She was started on H. pylori treatment after procedure.  She completed treatment course approximately 2 weeks prior to admission. She has also been on oral iron and having dark stools. Due to worsening weakness and ongoing abdominal discomfort, she presented to the ER for further evaluation. Hemoglobin was found to be 5.9 g/dL.  She was admitted for blood transfusion and GI evaluation. She had recent colonoscopy and upper endoscopy performed June 2023.  She was not recommended for repeat scopes.  RUQ ultrasound showed hepatic steatosis and hypoechoic region in the right lobe liver.  Due to abdominal pain on admission, further workup commenced with CT abdomen/pelvis in case of gallbladder pathology. CT was notable for multiple findings.  There was a large mass measuring 4.7 x 3 cm noted in the right lower quadrant and also large heterogenous mass superior to the cervix measuring 7.9 x 6.1 cm. Multiple dilated loops of bowel concerning for compression/obstruction were also appreciated. She also has a recent history over the past year of dysfunctional uterine bleeding and thickened endometrium noted on outpatient ultrasound and again seen on CT.  She had declined outpatient endometrial biopsy.  Due to these findings, further workup commenced with consulting surgery, oncology, and gyn-oncology. Tumor markers ordered as well.  On 9/9, she ultimately underwent ex-lap with distal small bowel resection with primary anastomosis along with excisional biopsy of pelvic, multiple abdominal wall and omental masses.  Interval History:  Reports having another bowel movement yesterday and has been  having flatus in between.  Tolerating diet advancement with fulls and starting solids today with TPN now starting to be decreased. Has been ambulating well also.  Assessment and Plan: Colon adenocarcinoma (HCC) - per CT there is 4.7 x 3 cm mass in RLL and mass superior to cervix 7.9 x 6.1 cm. Known thickened endometrium again seen and recent hx DUB that was being monitored outpatient.  - initial concern was for GYN malignancy; this has now been ruled out after ex-lap and pathology results peritoneal nodule needle core biopsy from 06/24/2023; endometrial biopsy also negative from 06/24/2023 -Patient underwent ex lap with distal small bowel resection with primary anastomosis, excisional biopsy of pelvic, multiple abdominal wall and omental masses on 06/30/2023 - general surgery following - oncology also now on board and following for further outpatient plans  SBO (small bowel obstruction) (HCC) - Presented with distended abdomen and decreased bowel sounds.  Initially suspected constipation contributing but after CT, concern is now for underlying obstruction which may be due to mass effect - general surgery following as well - now s/p ex-lap with small bowel resection with primary anastomosis and excisional biopsy of pelvic, multi abdominal wall, and omental masses on 06/30/2023 - diet further advanced today per surgery and TPN 1/2 rate - starting to have more BM and flatus as well  Microcytic anemia - Multifactorial etiology at this point.  Overt iron deficiency on admission, newly diagnosed malignancy, and some expected blood loss from recent surgery on 06/30/2023 - Hgb 8.1 g/dL this morning  -Still needs DVT prophylaxis with HSQ given high risk of hypercoagulability/VTE; also needs Khorana score (currently at least 3 points, high risk)  calculated prior to discharge and/or before treatment most likely   IDA (iron deficiency anemia) - on admission: iron labs: Iron 9, sat ratio 2%, TIBC 444. Ferritin 8 -  s/p 4 units PRBC so far since admission and received 500 mg Ferrlecit on admission - repeat iron studies on 9/13 still low (mild improvement). Will give further repletion with Ferrlecit x 4 days  Upper GI bleed-resolved as of 07/03/2023 - Possibly slow/chronic blood loss from underlying "chronic inactive gastritis" noted on EGD from 05/08/2023.  No H. pylori organisms were noted on immunohistochemical stain but due to pattern of inflammation, she was started on H. pylori treatment which she completed approximately 2 weeks prior to admission -Stools were melanotic in appearance but she states she has also been on oral iron daily -Hemoglobin certainly low on admission, 5.9 g/dL.   - now also s/p ex-lap with presumed some blood loss as expected - will continue to trend H/H and transfuse as necessary - no further scopes per GI at this time  Type 2 diabetes mellitus with obesity (HCC) - Last A1c 6.5% on 12/05/2022 - SSI ordered in setting of TPN use   Essential hypertension - hold ARB in setting of above - PRN meds to be added; can make scheduled if needed   Old records reviewed in assessment of this patient  Antimicrobials:   DVT prophylaxis:  heparin injection 5,000 Units Start: 07/01/23 1000 SCDs Start: 06/20/23 2059   Code Status:   Code Status: Full Code  Mobility Assessment (Last 72 Hours)     Mobility Assessment     Row Name 07/06/23 1000 07/06/23 0745 07/06/23 0500 07/05/23 1920 07/05/23 1700   Does patient have an order for bedrest or is patient medically unstable No - Continue assessment No - Continue assessment No - Continue assessment No - Continue assessment No - Continue assessment   What is the highest level of mobility based on the progressive mobility assessment? Level 6 (Walks independently in room and hall) - Balance while walking in room without assist - Complete Level 6 (Walks independently in room and hall) - Balance while walking in room without assist - Complete  Level 6 (Walks independently in room and hall) - Balance while walking in room without assist - Complete Level 6 (Walks independently in room and hall) - Balance while walking in room without assist - Complete Level 6 (Walks independently in room and hall) - Balance while walking in room without assist - Complete    Row Name 07/05/23 0800 07/05/23 0430 07/04/23 1925 07/04/23 0845 07/03/23 2100   Does patient have an order for bedrest or is patient medically unstable No - Continue assessment No - Continue assessment No - Continue assessment No - Continue assessment No - Continue assessment   What is the highest level of mobility based on the progressive mobility assessment? Level 6 (Walks independently in room and hall) - Balance while walking in room without assist - Complete Level 6 (Walks independently in room and hall) - Balance while walking in room without assist - Complete Level 6 (Walks independently in room and hall) - Balance while walking in room without assist - Complete Level 5 (Walks with assist in room/hall) - Balance while stepping forward/back and can walk in room with assist - Complete Level 5 (Walks with assist in room/hall) - Balance while stepping forward/back and can walk in room with assist - Complete            Barriers to discharge: None  Disposition Plan: Home Status is: Inpatient  Objective: Blood pressure 130/62, pulse 86, temperature 98.7 F (37.1 C), temperature source Oral, resp. rate 18, height 5\' 4"  (1.626 m), weight 92.6 kg, SpO2 96%.  Examination:  Physical Exam Constitutional:      Appearance: Normal appearance.  HENT:     Head: Normocephalic and atraumatic.     Mouth/Throat:     Mouth: Mucous membranes are moist.  Eyes:     Extraocular Movements: Extraocular movements intact.  Cardiovascular:     Rate and Rhythm: Regular rhythm.  Pulmonary:     Effort: Pulmonary effort is normal. No respiratory distress.     Breath sounds: Normal breath sounds. No  wheezing.  Abdominal:     General: A surgical scar is present. Bowel sounds are normal. There is no distension.     Palpations: Abdomen is soft.     Tenderness: There is no abdominal tenderness.     Comments: BS now improved throughout  Musculoskeletal:        General: Normal range of motion.     Cervical back: Normal range of motion and neck supple.  Skin:    General: Skin is warm and dry.  Neurological:     General: No focal deficit present.     Mental Status: She is alert.  Psychiatric:        Mood and Affect: Mood normal.      Consultants:  GI - signed off General surgery Gyn-onc - signed off Oncology IR  Procedures:  06/30/23: Exploratory laparotomy, distal small bowel resection with primary anastomosis, excisional biopsy of pelvic, multiple abdominal wall and omental masses   Data Reviewed: Results for orders placed or performed during the hospital encounter of 06/20/23 (from the past 24 hour(s))  Glucose, capillary     Status: Abnormal   Collection Time: 07/05/23  2:01 PM  Result Value Ref Range   Glucose-Capillary 139 (H) 70 - 99 mg/dL   Comment 1 Notify RN   Glucose, capillary     Status: Abnormal   Collection Time: 07/05/23  8:52 PM  Result Value Ref Range   Glucose-Capillary 150 (H) 70 - 99 mg/dL  Basic metabolic panel     Status: Abnormal   Collection Time: 07/06/23  4:16 AM  Result Value Ref Range   Sodium 136 135 - 145 mmol/L   Potassium 3.7 3.5 - 5.1 mmol/L   Chloride 101 98 - 111 mmol/L   CO2 27 22 - 32 mmol/L   Glucose, Bld 150 (H) 70 - 99 mg/dL   BUN 10 6 - 20 mg/dL   Creatinine, Ser 4.09 0.44 - 1.00 mg/dL   Calcium 8.5 (L) 8.9 - 10.3 mg/dL   GFR, Estimated >81 >19 mL/min   Anion gap 8 5 - 15  Glucose, capillary     Status: Abnormal   Collection Time: 07/06/23  5:06 AM  Result Value Ref Range   Glucose-Capillary 130 (H) 70 - 99 mg/dL    I have reviewed pertinent nursing notes, vitals, labs, and images as necessary. I have ordered labwork to  follow up on as indicated.  I have reviewed the last notes from staff over past 24 hours. I have discussed patient's care plan and test results with nursing staff, CM/SW, and other staff as appropriate.    LOS: 16 days   Lewie Chamber, MD [facility] 07/06/2023, 12:24 PM

## 2023-07-07 ENCOUNTER — Ambulatory Visit: Payer: BC Managed Care – PPO | Admitting: Nurse Practitioner

## 2023-07-07 DIAGNOSIS — C189 Malignant neoplasm of colon, unspecified: Secondary | ICD-10-CM | POA: Diagnosis not present

## 2023-07-07 DIAGNOSIS — K56609 Unspecified intestinal obstruction, unspecified as to partial versus complete obstruction: Secondary | ICD-10-CM | POA: Diagnosis not present

## 2023-07-07 DIAGNOSIS — D509 Iron deficiency anemia, unspecified: Secondary | ICD-10-CM | POA: Diagnosis not present

## 2023-07-07 DIAGNOSIS — C179 Malignant neoplasm of small intestine, unspecified: Secondary | ICD-10-CM | POA: Diagnosis not present

## 2023-07-07 LAB — GLUCOSE, CAPILLARY
Glucose-Capillary: 122 mg/dL — ABNORMAL HIGH (ref 70–99)
Glucose-Capillary: 110 mg/dL — ABNORMAL HIGH (ref 70–99)
Glucose-Capillary: 142 mg/dL — ABNORMAL HIGH (ref 70–99)
Glucose-Capillary: 101 mg/dL — ABNORMAL HIGH (ref 70–99)

## 2023-07-07 LAB — CBC
HCT: 27.8 % — ABNORMAL LOW (ref 36.0–46.0)
Hemoglobin: 8.1 g/dL — ABNORMAL LOW (ref 12.0–15.0)
MCH: 24 pg — ABNORMAL LOW (ref 26.0–34.0)
MCHC: 29.1 g/dL — ABNORMAL LOW (ref 30.0–36.0)
MCV: 82.2 fL (ref 80.0–100.0)
Platelets: 458 10*3/uL — ABNORMAL HIGH (ref 150–400)
RBC: 3.38 MIL/uL — ABNORMAL LOW (ref 3.87–5.11)
RDW: 28.4 % — ABNORMAL HIGH (ref 11.5–15.5)
WBC: 16.2 10*3/uL — ABNORMAL HIGH (ref 4.0–10.5)
nRBC: 0.2 % (ref 0.0–0.2)

## 2023-07-07 LAB — COMPREHENSIVE METABOLIC PANEL
ALT: 15 U/L (ref 0–44)
AST: 20 U/L (ref 15–41)
Albumin: 2.4 g/dL — ABNORMAL LOW (ref 3.5–5.0)
Alkaline Phosphatase: 137 U/L — ABNORMAL HIGH (ref 38–126)
Anion gap: 10 (ref 5–15)
BUN: 9 mg/dL (ref 6–20)
CO2: 27 mmol/L (ref 22–32)
Calcium: 8.6 mg/dL — ABNORMAL LOW (ref 8.9–10.3)
Chloride: 97 mmol/L — ABNORMAL LOW (ref 98–111)
Creatinine, Ser: 0.77 mg/dL (ref 0.44–1.00)
GFR, Estimated: >60 mL/min (ref >60)
Glucose, Bld: 110 mg/dL — ABNORMAL HIGH (ref 70–99)
Potassium: 3.8 mmol/L (ref 3.5–5.1)
Sodium: 134 mmol/L — ABNORMAL LOW (ref 135–145)
Total Bilirubin: 0.6 mg/dL (ref 0.3–1.2)
Total Protein: 7.3 g/dL (ref 6.5–8.1)

## 2023-07-07 LAB — MAGNESIUM: Magnesium: 2 mg/dL (ref 1.7–2.4)

## 2023-07-07 LAB — PHOSPHORUS: Phosphorus: 4.1 mg/dL (ref 2.5–4.6)

## 2023-07-07 LAB — TRIGLYCERIDES: Triglycerides: 176 mg/dL — ABNORMAL HIGH (ref <150)

## 2023-07-07 MED ORDER — PANTOPRAZOLE SODIUM 40 MG PO TBEC
40.0000 mg | DELAYED_RELEASE_TABLET | Freq: Two times a day (BID) | ORAL | Status: DC
  Administered 2023-07-07 – 2023-07-16 (×16): 40 mg via ORAL
  Filled 2023-07-07 (×17): qty 1

## 2023-07-07 MED ORDER — POLYETHYLENE GLYCOL 3350 17 G PO PACK
17.0000 g | PACK | Freq: Every day | ORAL | Status: DC
  Administered 2023-07-07 – 2023-07-16 (×5): 17 g via ORAL
  Filled 2023-07-07 (×7): qty 1

## 2023-07-07 NOTE — Progress Notes (Signed)
Progress Note    MARIELLE KLENKE   FUX:[id number]  DOB: [birth year]  DOA: 06/20/2023     17 PCP: Arnette Felts, [name]  Initial CC: dark stools, abdominal pain  Hospital Course: Ms. Nurnberg is a 57 yo female with PMH anemia, HTN, pre-DM who presented with abdominal pain.  She recently underwent EGD on 05/08/2023.  She was started on H. pylori treatment after procedure.  She completed treatment course approximately 2 weeks prior to admission. She has also been on oral iron and having dark stools. Due to worsening weakness and ongoing abdominal discomfort, she presented to the ER for further evaluation. Hemoglobin was found to be 5.9 g/dL.  She was admitted for blood transfusion and GI evaluation. She had recent colonoscopy and upper endoscopy performed June 2023.  She was not recommended for repeat scopes.  RUQ ultrasound showed hepatic steatosis and hypoechoic region in the right lobe liver.  Due to abdominal pain on admission, further workup commenced with CT abdomen/pelvis in case of gallbladder pathology. CT was notable for multiple findings.  There was a large mass measuring 4.7 x 3 cm noted in the right lower quadrant and also large heterogenous mass superior to the cervix measuring 7.9 x 6.1 cm. Multiple dilated loops of bowel concerning for compression/obstruction were also appreciated. She also has a recent history over the past year of dysfunctional uterine bleeding and thickened endometrium noted on outpatient ultrasound and again seen on CT.  She had declined outpatient endometrial biopsy.  Due to these findings, further workup commenced with consulting surgery, oncology, and gyn-oncology. Tumor markers ordered as well.  On 9/9, she ultimately underwent ex-lap with distal small bowel resection with primary anastomosis along with excisional biopsy of pelvic, multiple abdominal wall and omental masses.  Interval History:  Noted overnight.  Still appears to be having good bowel  function.  Despite diet advancement, oral intake still slow to improve.  TPN being weaned off per surgery.  Assessment and Plan: Colon adenocarcinoma (HCC) - per CT there is 4.7 x 3 cm mass in RLL and mass superior to cervix 7.9 x 6.1 cm. Known thickened endometrium again seen and recent hx DUB that was being monitored outpatient.  - initial concern was for GYN malignancy; this has now been ruled out after ex-lap and pathology results peritoneal nodule needle core biopsy from 06/24/2023; endometrial biopsy also negative from 06/24/2023 -Patient underwent ex lap with distal small bowel resection with primary anastomosis, excisional biopsy of pelvic, multiple abdominal wall and omental masses on 06/30/2023 - general surgery following - oncology also now on board and following for further outpatient plans  SBO (small bowel obstruction) (HCC) - Presented with distended abdomen and decreased bowel sounds.  Initially suspected constipation contributing but after CT, concern is now for underlying obstruction which may be due to mass effect - general surgery following as well - now s/p ex-lap with small bowel resection with primary anastomosis and excisional biopsy of pelvic, multi abdominal wall, and omental masses on 06/30/2023 - diet further advanced 9/15 per surgery and TPN 1/2 rate; planning on TPN to be d/c'd today per surgery - starting to have more BM and flatus as well  Microcytic anemia - Multifactorial etiology at this point.  Overt iron deficiency on admission, newly diagnosed malignancy, and some expected blood loss from recent surgery on 06/30/2023 - Hgb 8.1 g/dL this morning  -Still needs DVT prophylaxis with HSQ given high risk of hypercoagulability/VTE; also needs Khorana score (currently at least 3 points,  high risk) calculated prior to discharge and/or before treatment most likely   IDA (iron deficiency anemia) - on admission: iron labs: Iron 9, sat ratio 2%, TIBC 444. Ferritin 8 - s/p 4  units PRBC so far since admission and received 500 mg Ferrlecit on admission - repeat iron studies on 9/13 still low (mild improvement). Will give further repletion with Ferrlecit x 4 days  Upper GI bleed-resolved as of 07/03/2023 - Possibly slow/chronic blood loss from underlying "chronic inactive gastritis" noted on EGD from 05/08/2023.  No H. pylori organisms were noted on immunohistochemical stain but due to pattern of inflammation, she was started on H. pylori treatment which she completed approximately 2 weeks prior to admission -Stools were melanotic in appearance but she states she has also been on oral iron daily -Hemoglobin certainly low on admission, 5.9 g/dL.   - now also s/p ex-lap with presumed some blood loss as expected - will continue to trend H/H and transfuse as necessary - no further scopes per GI at this time  Type 2 diabetes mellitus with obesity (HCC) - Last A1c 6.5% on 12/05/2022 - SSI ordered in setting of TPN use  - once TPN fully off, will d/c SSI  Essential hypertension - hold ARB in setting of above - PRN meds to be added; can make scheduled if needed   Old records reviewed in assessment of this patient  Antimicrobials:   DVT prophylaxis:  heparin injection 5,000 Units Start: 07/01/23 1000 SCDs Start: 06/20/23 2059   Code Status:   Code Status: Full Code  Mobility Assessment (Last 72 Hours)     Mobility Assessment     Row Name 07/07/23 1012 07/06/23 2100 07/06/23 1000 07/06/23 0745 07/06/23 0500   Does patient have an order for bedrest or is patient medically unstable No - Continue assessment No - Continue assessment No - Continue assessment No - Continue assessment No - Continue assessment   What is the highest level of mobility based on the progressive mobility assessment? Level 6 (Walks independently in room and hall) - Balance while walking in room without assist - Complete Level 6 (Walks independently in room and hall) - Balance while walking in  room without assist - Complete Level 6 (Walks independently in room and hall) - Balance while walking in room without assist - Complete Level 6 (Walks independently in room and hall) - Balance while walking in room without assist - Complete Level 6 (Walks independently in room and hall) - Balance while walking in room without assist - Complete    Row Name 07/05/23 1920 07/05/23 1700 07/05/23 0800 07/05/23 0430 07/04/23 1925   Does patient have an order for bedrest or is patient medically unstable No - Continue assessment No - Continue assessment No - Continue assessment No - Continue assessment No - Continue assessment   What is the highest level of mobility based on the progressive mobility assessment? Level 6 (Walks independently in room and hall) - Balance while walking in room without assist - Complete Level 6 (Walks independently in room and hall) - Balance while walking in room without assist - Complete Level 6 (Walks independently in room and hall) - Balance while walking in room without assist - Complete Level 6 (Walks independently in room and hall) - Balance while walking in room without assist - Complete Level 6 (Walks independently in room and hall) - Balance while walking in room without assist - Complete  Barriers to discharge: None Disposition Plan: Home Status is: Inpatient  Objective: Blood pressure 132/64, pulse 80, temperature 98.5 F (36.9 C), resp. rate 15, height 5\' 4"  (1.626 m), weight 92.6 kg, SpO2 100%.  Examination:  Physical Exam Constitutional:      Appearance: Normal appearance.  HENT:     Head: Normocephalic and atraumatic.     Mouth/Throat:     Mouth: Mucous membranes are moist.  Eyes:     Extraocular Movements: Extraocular movements intact.  Cardiovascular:     Rate and Rhythm: Regular rhythm.  Pulmonary:     Effort: Pulmonary effort is normal. No respiratory distress.     Breath sounds: Normal breath sounds. No wheezing.  Abdominal:      General: A surgical scar is present. Bowel sounds are normal. There is no distension.     Palpations: Abdomen is soft.     Tenderness: There is no abdominal tenderness.     Comments: BS now improved throughout  Musculoskeletal:        General: Normal range of motion.     Cervical back: Normal range of motion and neck supple.  Skin:    General: Skin is warm and dry.  Neurological:     General: No focal deficit present.     Mental Status: She is alert.  Psychiatric:        Mood and Affect: Mood normal.      Consultants:  GI - signed off General surgery Gyn-onc - signed off Oncology IR  Procedures:  06/30/23: Exploratory laparotomy, distal small bowel resection with primary anastomosis, excisional biopsy of pelvic, multiple abdominal wall and omental masses   Data Reviewed: Results for orders placed or performed during the hospital encounter of 06/20/23 (from the past 24 hour(s))  Glucose, capillary     Status: Abnormal   Collection Time: 07/06/23  9:16 PM  Result Value Ref Range   Glucose-Capillary 125 (H) 70 - 99 mg/dL  Comprehensive metabolic panel     Status: Abnormal   Collection Time: 07/07/23  4:54 AM  Result Value Ref Range   Sodium 134 (L) 135 - 145 mmol/L   Potassium 3.8 3.5 - 5.1 mmol/L   Chloride 97 (L) 98 - 111 mmol/L   CO2 27 22 - 32 mmol/L   Glucose, Bld 110 (H) 70 - 99 mg/dL   BUN 9 6 - 20 mg/dL   Creatinine, Ser 5.62 0.44 - 1.00 mg/dL   Calcium 8.6 (L) 8.9 - 10.3 mg/dL   Total Protein 7.3 6.5 - 8.1 g/dL   Albumin 2.4 (L) 3.5 - 5.0 g/dL   AST 20 15 - 41 U/L   ALT 15 0 - 44 U/L   Alkaline Phosphatase 137 (H) 38 - 126 U/L   Total Bilirubin 0.6 0.3 - 1.2 mg/dL   GFR, Estimated >13 >08 mL/min   Anion gap 10 5 - 15  Magnesium     Status: None   Collection Time: 07/07/23  4:54 AM  Result Value Ref Range   Magnesium 2.0 1.7 - 2.4 mg/dL  Phosphorus     Status: None   Collection Time: 07/07/23  4:54 AM  Result Value Ref Range   Phosphorus 4.1 2.5 - 4.6  mg/dL  Triglycerides     Status: Abnormal   Collection Time: 07/07/23  4:54 AM  Result Value Ref Range   Triglycerides 176 (H) <150 mg/dL  CBC     Status: Abnormal   Collection Time: 07/07/23  4:54 AM  Result  Value Ref Range   WBC 16.2 (H) 4.0 - 10.5 K/uL   RBC 3.38 (L) 3.87 - 5.11 MIL/uL   Hemoglobin 8.1 (L) 12.0 - 15.0 g/dL   HCT 62.8 (L) 31.5 - 17.6 %   MCV 82.2 80.0 - 100.0 fL   MCH 24.0 (L) 26.0 - 34.0 pg   MCHC 29.1 (L) 30.0 - 36.0 g/dL   RDW 16.0 (H) 73.7 - 10.6 %   Platelets 458 (H) 150 - 400 K/uL   nRBC 0.2 0.0 - 0.2 %  Glucose, capillary     Status: Abnormal   Collection Time: 07/07/23  5:26 AM  Result Value Ref Range   Glucose-Capillary 122 (H) 70 - 99 mg/dL  Glucose, capillary     Status: Abnormal   Collection Time: 07/07/23  7:38 AM  Result Value Ref Range   Glucose-Capillary 110 (H) 70 - 99 mg/dL   Comment 1 Notify RN    Comment 2 Document in Chart   Glucose, capillary     Status: Abnormal   Collection Time: 07/07/23  2:18 PM  Result Value Ref Range   Glucose-Capillary 142 (H) 70 - 99 mg/dL   Comment 1 Notify RN     I have reviewed pertinent nursing notes, vitals, labs, and images as necessary. I have ordered labwork to follow up on as indicated.  I have reviewed the last notes from staff over past 24 hours. I have discussed patient's care plan and test results with nursing staff, CM/SW, and other staff as appropriate.    LOS: 17 days   Lewie Chamber, MD [facility] 07/07/2023, 4:15 PM

## 2023-07-07 NOTE — Anesthesia Postprocedure Evaluation (Signed)
Anesthesia Post Note  Patient: Linda [name]  Procedure(s) Performed: EXPLORATORY LAPAROTOMY, SMALL BOWEL RESECTION, AND EXCISIONAL BIOPSIES OF MULTIPLE OMENTAL MASSES     Patient location during evaluation: PACU Anesthesia Type: General Level of consciousness: awake and alert, oriented and patient cooperative Pain management: pain level controlled Vital Signs Assessment: post-procedure vital signs reviewed and stable Respiratory status: spontaneous breathing, nonlabored ventilation and respiratory function stable Cardiovascular status: blood pressure returned to baseline and stable Postop Assessment: no apparent nausea or vomiting Anesthetic complications: no                Lannie Fields

## 2023-07-07 NOTE — Progress Notes (Signed)
Nutrition Follow-up  INTERVENTION:   -TPN weaning today  -Continue Boost Breeze po TID, each supplement provides 250 kcal and 9 grams of protein  -Encouraged PO intake  NUTRITION DIAGNOSIS:   Inadequate oral intake related to chronic illness as evidenced by per patient/family report.  Ongoing.  GOAL:   Patient will meet greater than or equal to 90% of their needs  Progressing.  MONITOR:   PO intake, Supplement acceptance, Labs, Weight trends, I & O's   ASSESSMENT:   57 yo female with PMH anemia, HTN, pre-DM who presented with abdominal pain.  She recently underwent EGD on 05/08/2023.  She was started on H. pylori treatment after procedure.  She completed treatment course approximately 2 weeks prior to admission.  She has also been on oral iron and having dark stools. She was admitted for blood transfusion and GI evaluation.  8/30: admitted, NPO-> CLD 8/31: NPO -> regular diet 9/1: Heart healthy diet -> NPO->CLD 9/2: TPN initiated, NPO 9/9: s/p Exploratory laparotomy, distal small bowel resection with primary anastomosis, excisional biopsy of pelvic, multiple abdominal wall and omental masses 9/12: CLD 9/13: FLD 9/15: Regular diet  Patient now on regular diet. Reports she feels a bit scared to eat as she wants off the TPN. Had bacon this morning and she is drinking Boost Breeze 3 times daily. She prefers these and does not want to switch to Ensure.   TPN to continue at 40 ml/hr until this evening.   Admission weight: 206 lbs Current weight: 204 lbs  Medications: Colace, Miralax, Ferric gluconate  Labs reviewed: CBGs: 110-157 Low Na  TG 176  Diet Order:   Diet Order             Diet regular Fluid consistency: Thin  Diet effective now                   EDUCATION NEEDS:   No education needs have been identified at this time  Skin:  Skin Assessment: Skin Integrity Issues: Skin Integrity Issues:: Incisions Incisions: 9/9 abdomen  Last BM:   9/14  Height:   Ht Readings from Last 1 Encounters:  06/20/23 5\' 4"  (1.626 m)    Weight:   Wt Readings from Last 1 Encounters:  07/06/23 92.6 kg    BMI:  Body mass index is 35.04 kg/m.  Estimated Nutritional Needs:   Kcal:  1700-1900  Protein:  80-90g  Fluid:  1.9L/day   Tilda Franco, MS, RD, LDN Inpatient Clinical Dietitian Contact information available via Amion

## 2023-07-07 NOTE — Progress Notes (Signed)
Plan to discontinue TPN after current bag. Already running at 71ml/hr so no need to wean further.  Will d/c TPN labs orders, but leave SSI and CBG checks with h/o pre-DM.  Linda [name] S. Merilynn Finland, PharmD, BCPS Clinical Staff Pharmacist [email or web address]

## 2023-07-07 NOTE — Progress Notes (Signed)
Patient ID: Linda [name], female   DOB: [birth year], 57 y.o.   MRN: [id number] [facility] Surgery Progress Note  7 Days Post-Op  Subjective: CC-  Feeling well. Abdominal pain well controlled. Still a little bloated but no n/v. She ate about 25% of 2 meals yesterday, and drank Boost x3. Passing flatus, BM x1 yesterday. Having more drainage from abdominal wound.  Objective: Vital signs in last 24 hours: Temp:  [98.5 F (36.9 C)-98.7 F (37.1 C)] 98.7 F (37.1 C) (09/16 0529) Pulse Rate:  [85-98] 85 (09/16 0529) Resp:  [16-18] 16 (09/16 0529) BP: (130-135)/(62-65) 131/64 (09/16 0529) SpO2:  [95 %-96 %] 95 % (09/16 0529) Weight:  [92.6 kg] 92.6 kg (09/15 0949) Last BM Date : 07/05/23  Intake/Output from previous day: 09/15 0701 - 09/16 0700 In: 880.6 [P.O.:480; I.V.:344.2; IV Piggyback:56.4] Out: -  Intake/Output this shift: No intake/output data recorded.   PE: Gen:  Alert, NAD, pleasant Pulm:  rate and effort normal Abd: distended but soft, mild tenderness over incision, bowel sounds present, incision mostly  cdi with staples present and purulent foul smelling drainage from inferior portion of wound  Lab Results:  Recent Labs    07/05/23 0250 07/07/23 0454  WBC 17.5* 16.2*  HGB 7.7* 8.1*  HCT 26.7* 27.8*  PLT 383 458*   BMET Recent Labs    07/06/23 0416 07/07/23 0454  NA 136 134*  K 3.7 3.8  CL 101 97*  CO2 27 27  GLUCOSE 150* 110*  BUN 10 9  CREATININE 0.55 0.77  CALCIUM 8.5* 8.6*   PT/INR No results for input(s): "LABPROT", "INR" in the last 72 hours. CMP     Component Value Date/Time   NA 134 (L) 07/07/2023 0454   NA 141 05/16/2022 1044   K 3.8 07/07/2023 0454   CL 97 (L) 07/07/2023 0454   CO2 27 07/07/2023 0454   GLUCOSE 110 (H) 07/07/2023 0454   BUN 9 07/07/2023 0454   BUN 9 05/16/2022 1044   CREATININE 0.77 07/07/2023 0454   CALCIUM 8.6 (L) 07/07/2023 0454   PROT 7.3 07/07/2023 0454   PROT 7.8 05/16/2022 1044   ALBUMIN 2.4 (L)  07/07/2023 0454   ALBUMIN 4.4 05/16/2022 1044   AST 20 07/07/2023 0454   ALT 15 07/07/2023 0454   ALKPHOS 137 (H) 07/07/2023 0454   BILITOT 0.6 07/07/2023 0454   BILITOT <0.2 05/16/2022 1044   GFRNONAA >60 07/07/2023 0454   Lipase     Component Value Date/Time   LIPASE 35 06/20/2023 1436       Studies/Results: No results found.  Anti-infectives: Anti-infectives (From admission, onward)    Start     Dose/Rate Route Frequency Ordered Stop   06/30/23 0830  ceFAZolin (ANCEF) IVPB 2g/100 mL premix        2 g 200 mL/hr over 30 Minutes Intravenous On call to O.R. 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 Mosetta Putt is following   POD# 7 s/p ex lap, SBR and biopsy of multiple sites 9/9 Dr. Fredricka Bonine - surgical path pending - Continue regular diet and protein shakes. Dc TPN today - 3 staples removed from inferior portion of wound today for infection.  Pack BID and PRN saturation. Shower with wound open. - mobilize  - Hgb stable, s/p 1u PRBCs 9/11. IV iron ordered by primary team 9/13 - WBC slowly trending down, afebrile. Monitor - will discuss port placement with MD   FEN: RD, Boost VTE: sqh ID: no current abx   - per TRH -  IDA T2DM HTN    LOS: 17 days    Franne Forts, [facility] Surgery 07/07/2023, 9:23 AM Please see Amion for pager number during day hours 7:00am-4:30pm

## 2023-07-07 NOTE — Plan of Care (Signed)

## 2023-07-08 ENCOUNTER — Inpatient Hospital Stay (HOSPITAL_COMMUNITY): Payer: BC Managed Care – PPO

## 2023-07-08 DIAGNOSIS — C179 Malignant neoplasm of small intestine, unspecified: Secondary | ICD-10-CM

## 2023-07-08 DIAGNOSIS — C189 Malignant neoplasm of colon, unspecified: Secondary | ICD-10-CM | POA: Diagnosis not present

## 2023-07-08 DIAGNOSIS — K56609 Unspecified intestinal obstruction, unspecified as to partial versus complete obstruction: Secondary | ICD-10-CM | POA: Diagnosis not present

## 2023-07-08 HISTORY — PX: IR IMAGING GUIDED PORT INSERTION: IMG5740

## 2023-07-08 LAB — CBC WITH DIFFERENTIAL/PLATELET
Abs Immature Granulocytes: 0.4 10*3/uL — ABNORMAL HIGH (ref 0.00–0.07)
Basophils Absolute: 0 10*3/uL (ref 0.0–0.1)
Basophils Relative: 0 %
Eosinophils Absolute: 0.2 10*3/uL (ref 0.0–0.5)
Eosinophils Relative: 2 %
HCT: 26.1 % — ABNORMAL LOW (ref 36.0–46.0)
Hemoglobin: 7.6 g/dL — ABNORMAL LOW (ref 12.0–15.0)
Immature Granulocytes: 4 %
Lymphocytes Relative: 13 %
Lymphs Abs: 1.4 10*3/uL (ref 0.7–4.0)
MCH: 24.3 pg — ABNORMAL LOW (ref 26.0–34.0)
MCHC: 29.1 g/dL — ABNORMAL LOW (ref 30.0–36.0)
MCV: 83.4 fL (ref 80.0–100.0)
Monocytes Absolute: 0.9 10*3/uL (ref 0.1–1.0)
Monocytes Relative: 8 %
Neutro Abs: 8 10*3/uL — ABNORMAL HIGH (ref 1.7–7.7)
Neutrophils Relative %: 73 %
Platelets: 441 10*3/uL — ABNORMAL HIGH (ref 150–400)
RBC: 3.13 MIL/uL — ABNORMAL LOW (ref 3.87–5.11)
RDW: 29.1 % — ABNORMAL HIGH (ref 11.5–15.5)
WBC: 10.9 10*3/uL — ABNORMAL HIGH (ref 4.0–10.5)
nRBC: 0.2 % (ref 0.0–0.2)

## 2023-07-08 LAB — GLUCOSE, CAPILLARY
Glucose-Capillary: 91 mg/dL (ref 70–99)
Glucose-Capillary: 56 mg/dL — ABNORMAL LOW (ref 70–99)
Glucose-Capillary: 86 mg/dL (ref 70–99)

## 2023-07-08 MED ORDER — LIDOCAINE-EPINEPHRINE 1 %-1:100000 IJ SOLN
INTRAMUSCULAR | Status: AC
  Filled 2023-07-08: qty 1

## 2023-07-08 MED ORDER — FENTANYL CITRATE (PF) 100 MCG/2ML IJ SOLN
INTRAMUSCULAR | Status: AC | PRN
  Administered 2023-07-08 (×2): 50 ug via INTRAVENOUS

## 2023-07-08 MED ORDER — HYDROMORPHONE HCL 1 MG/ML IJ SOLN
0.5000 mg | INTRAMUSCULAR | Status: DC | PRN
  Administered 2023-07-08 – 2023-07-11 (×12): 1 mg via INTRAVENOUS
  Administered 2023-07-11: 0.5 mg via INTRAVENOUS
  Administered 2023-07-11 – 2023-07-15 (×26): 1 mg via INTRAVENOUS
  Filled 2023-07-08 (×40): qty 1

## 2023-07-08 MED ORDER — FENTANYL CITRATE (PF) 100 MCG/2ML IJ SOLN
INTRAMUSCULAR | Status: AC
  Filled 2023-07-08: qty 2

## 2023-07-08 MED ORDER — MIDAZOLAM HCL 2 MG/2ML IJ SOLN
INTRAMUSCULAR | Status: AC
  Filled 2023-07-08: qty 4

## 2023-07-08 MED ORDER — METHOCARBAMOL 500 MG PO TABS
500.0000 mg | ORAL_TABLET | Freq: Four times a day (QID) | ORAL | Status: DC
  Administered 2023-07-08 – 2023-07-15 (×27): 500 mg via ORAL
  Filled 2023-07-08 (×28): qty 1

## 2023-07-08 MED ORDER — MIDAZOLAM HCL 2 MG/2ML IJ SOLN
INTRAMUSCULAR | Status: AC | PRN
Start: 2023-07-08 — End: 2023-07-08
  Administered 2023-07-08 (×2): 1 mg via INTRAVENOUS

## 2023-07-08 NOTE — Progress Notes (Signed)
Progress Note    Linda [name]   VOJ:[id number]  DOB: 1966/03/29  DOA: 06/20/2023     18 PCP: Arnette Felts, [name]  Initial CC: dark stools, abdominal pain  Hospital Course: Linda [name] is a 57 yo female with PMH anemia, HTN, pre-DM who presented with abdominal pain.  She recently underwent EGD on 05/08/2023.  She was started on H. pylori treatment after procedure.  She completed treatment course approximately 2 weeks prior to admission. She has also been on oral iron and having dark stools. Due to worsening weakness and ongoing abdominal discomfort, she presented to the ER for further evaluation. Hemoglobin was found to be 5.9 g/dL.  She was admitted for blood transfusion and GI evaluation. She had recent colonoscopy and upper endoscopy performed June 2023.  She was not recommended for repeat scopes.  RUQ ultrasound showed hepatic steatosis and hypoechoic region in the right lobe liver.  Due to abdominal pain on admission, further workup commenced with CT abdomen/pelvis in case of gallbladder pathology. CT was notable for multiple findings.  There was a large mass measuring 4.7 x 3 cm noted in the right lower quadrant and also large heterogenous mass superior to the cervix measuring 7.9 x 6.1 cm. Multiple dilated loops of bowel concerning for compression/obstruction were also appreciated. She also has a recent history over the past year of dysfunctional uterine bleeding and thickened endometrium noted on outpatient ultrasound and again seen on CT.  She had declined outpatient endometrial biopsy.  Due to these findings, further workup commenced with consulting surgery, oncology, and gyn-oncology. Tumor markers ordered as well.  On 9/9, she ultimately underwent ex-lap with distal small bowel resection with primary anastomosis along with excisional biopsy of pelvic, multiple abdominal wall and omental masses.  Interval History:  Doing well when seen this morning.  Still having bowel movement  and flatus. Oral intake still not the greatest.  She is still working on increasing intake.  Yesterday she had bacon and [place] toast for breakfast followed by tuna salad for lunch but nothing for dinner.  She was able to drink a boost yesterday.  She is not sure if the boost may be causing some nausea/abdominal discomfort.  Assessment and Plan: Colon adenocarcinoma (HCC) - per CT there is 4.7 x 3 cm mass in RLL and mass superior to cervix 7.9 x 6.1 cm. Known thickened endometrium again seen and recent hx DUB that was being monitored outpatient.  - initial concern was for GYN malignancy; this has now been ruled out after ex-lap and pathology results peritoneal nodule needle core biopsy from 06/24/2023; endometrial biopsy also negative from 06/24/2023 -Patient underwent ex lap with distal small bowel resection with primary anastomosis, excisional biopsy of pelvic, multiple abdominal wall and omental masses on 06/30/2023 - general surgery following - oncology also now on board and following for further outpatient plans - PAC placed by IR on 07/08/23  SBO (small bowel obstruction) (HCC) - Presented with distended abdomen and decreased bowel sounds.  Initially suspected constipation contributing but after CT, concern is now for underlying obstruction which may be due to mass effect - general surgery following as well - now s/p ex-lap with small bowel resection with primary anastomosis and excisional biopsy of pelvic, multi abdominal wall, and omental masses on 06/30/2023 - diet further advanced 9/15 per surgery and TPN 1/2 rate; now TPN off on 9/17 - diet still slowly advancing; likely needs 2-3 more days to allow further advancement and tolerance  - starting to  have more BM and flatus as well  Microcytic anemia - Multifactorial etiology at this point.  Overt iron deficiency on admission, newly diagnosed malignancy, and some expected blood loss from recent surgery on 06/30/2023 - Hgb 8.1 g/dL this morning   -Still needs DVT prophylaxis with HSQ given high risk of hypercoagulability/VTE; also needs Khorana score (currently at least 3 points, high risk) calculated prior to discharge and/or before treatment most likely   IDA (iron deficiency anemia) - on admission: iron labs: Iron 9, sat ratio 2%, TIBC 444. Ferritin 8 - s/p 4 units PRBC so far since admission and received 500 mg Ferrlecit on admission - repeat iron studies on 9/13 still low (mild improvement). Will give further repletion with Ferrlecit x 4 days  Upper GI bleed-resolved as of 07/03/2023 - Possibly slow/chronic blood loss from underlying "chronic inactive gastritis" noted on EGD from 05/08/2023.  No H. pylori organisms were noted on immunohistochemical stain but due to pattern of inflammation, she was started on H. pylori treatment which she completed approximately 2 weeks prior to admission -Stools were melanotic in appearance but she states she has also been on oral iron daily -Hemoglobin certainly low on admission, 5.9 g/dL.   - now also s/p ex-lap with presumed some blood loss as expected - will continue to trend H/H and transfuse as necessary - no further scopes per GI at this time  Type 2 diabetes mellitus with obesity (HCC) - Last A1c 6.5% on 12/05/2022 - SSI ordered in setting of TPN use  - once TPN fully off, will d/c SSI  Essential hypertension - hold ARB in setting of above - PRN meds to be added; can make scheduled if needed   Old records reviewed in assessment of this patient  Antimicrobials:   DVT prophylaxis:  heparin injection 5,000 Units Start: 07/01/23 1000 SCDs Start: 06/20/23 2059   Code Status:   Code Status: Full Code  Mobility Assessment (Last 72 Hours)     Mobility Assessment     Row Name 07/08/23 0859 07/07/23 2100 07/07/23 1012 07/06/23 2100 07/06/23 1000   Does patient have an order for bedrest or is patient medically unstable No - Continue assessment No - Continue assessment No - Continue  assessment No - Continue assessment No - Continue assessment   What is the highest level of mobility based on the progressive mobility assessment? Level 6 (Walks independently in room and hall) - Balance while walking in room without assist - Complete Level 6 (Walks independently in room and hall) - Balance while walking in room without assist - Complete Level 6 (Walks independently in room and hall) - Balance while walking in room without assist - Complete Level 6 (Walks independently in room and hall) - Balance while walking in room without assist - Complete Level 6 (Walks independently in room and hall) - Balance while walking in room without assist - Complete    Row Name 07/06/23 0745 07/06/23 0500 07/05/23 1920 07/05/23 1700     Does patient have an order for bedrest or is patient medically unstable No - Continue assessment No - Continue assessment No - Continue assessment No - Continue assessment    What is the highest level of mobility based on the progressive mobility assessment? Level 6 (Walks independently in room and hall) - Balance while walking in room without assist - Complete Level 6 (Walks independently in room and hall) - Balance while walking in room without assist - Complete Level 6 (Walks independently in room  and hall) - Balance while walking in room without assist - Complete Level 6 (Walks independently in room and hall) - Balance while walking in room without assist - Complete             Barriers to discharge: None Disposition Plan: Home Status is: Inpatient  Objective: Blood pressure (!) 149/63, pulse (!) 105, temperature 100.1 F (37.8 C), temperature source Oral, resp. rate 18, height 5\' 4"  (1.626 m), weight 92.6 kg, SpO2 100%.  Examination:  Physical Exam Constitutional:      Appearance: Normal appearance.  HENT:     Head: Normocephalic and atraumatic.     Mouth/Throat:     Mouth: Mucous membranes are moist.  Eyes:     Extraocular Movements: Extraocular  movements intact.  Cardiovascular:     Rate and Rhythm: Regular rhythm.  Pulmonary:     Effort: Pulmonary effort is normal. No respiratory distress.     Breath sounds: Normal breath sounds. No wheezing.  Abdominal:     General: A surgical scar is present. Bowel sounds are normal. There is no distension.     Palpations: Abdomen is soft.     Tenderness: There is no abdominal tenderness.     Comments: BS now improved throughout  Musculoskeletal:        General: Normal range of motion.     Cervical back: Normal range of motion and neck supple.  Skin:    General: Skin is warm and dry.  Neurological:     General: No focal deficit present.     Mental Status: She is alert.  Psychiatric:        Mood and Affect: Mood normal.      Consultants:  GI - signed off General surgery Gyn-onc - signed off Oncology IR  Procedures:  06/30/23: Exploratory laparotomy, distal small bowel resection with primary anastomosis, excisional biopsy of pelvic, multiple abdominal wall and omental masses   Data Reviewed: Results for orders placed or performed during the hospital encounter of 06/20/23 (from the past 24 hour(s))  Glucose, capillary     Status: Abnormal   Collection Time: 07/07/23  9:43 PM  Result Value Ref Range   Glucose-Capillary 101 (H) 70 - 99 mg/dL  CBC with Differential/Platelet     Status: Abnormal   Collection Time: 07/08/23  3:21 AM  Result Value Ref Range   WBC 10.9 (H) 4.0 - 10.5 K/uL   RBC 3.13 (L) 3.87 - 5.11 MIL/uL   Hemoglobin 7.6 (L) 12.0 - 15.0 g/dL   HCT 16.1 (L) 09.6 - 04.5 %   MCV 83.4 80.0 - 100.0 fL   MCH 24.3 (L) 26.0 - 34.0 pg   MCHC 29.1 (L) 30.0 - 36.0 g/dL   RDW 40.9 (H) 81.1 - 91.4 %   Platelets 441 (H) 150 - 400 K/uL   nRBC 0.2 0.0 - 0.2 %   Neutrophils Relative % 73 %   Neutro Abs 8.0 (H) 1.7 - 7.7 K/uL   Lymphocytes Relative 13 %   Lymphs Abs 1.4 0.7 - 4.0 K/uL   Monocytes Relative 8 %   Monocytes Absolute 0.9 0.1 - 1.0 K/uL   Eosinophils Relative 2  %   Eosinophils Absolute 0.2 0.0 - 0.5 K/uL   Basophils Relative 0 %   Basophils Absolute 0.0 0.0 - 0.1 K/uL   Immature Granulocytes 4 %   Abs Immature Granulocytes 0.40 (H) 0.00 - 0.07 K/uL   Polychromasia PRESENT    Ovalocytes PRESENT   Glucose, capillary  Status: None   Collection Time: 07/08/23  5:16 AM  Result Value Ref Range   Glucose-Capillary 91 70 - 99 mg/dL    I have reviewed pertinent nursing notes, vitals, labs, and images as necessary. I have ordered labwork to follow up on as indicated.  I have reviewed the last notes from staff over past 24 hours. I have discussed patient's care plan and test results with nursing staff, CM/SW, and other staff as appropriate.    LOS: 18 days   Lewie Chamber, MD [facility] 07/08/2023, 3:29 PM

## 2023-07-08 NOTE — Progress Notes (Signed)
I spoke with Linda [name].  She is agreeable to hospital follow up with Dr Mosetta Putt on 9/23 at 1500, port flush with lab at 1430 for [facility] blood draw and chemo education on 9/25 at 1600. I told her I will follow up with her after her appt with Dr Mosetta Putt on 9/23. I told her I would continue to monitor her progress and if she is not discharged prior to 9/23 I would call her to reschedule her appts.  All questions were answered.  She verbalized understanding.

## 2023-07-08 NOTE — Consult Note (Signed)
Chief Complaint: Patient was seen in consultation today for colon adenocarcinoma  Referring Physician(s): Lewie Chamber, MD  Supervising Physician: Marliss Coots  Patient Status: [facility] - In-pt  History of Present Illness: Linda [name] is a 57 y.o. female with PMH significant for anemia, hypertension, and pre-diabetes being seen today in relation to newly diagnosed colon adenocarcinoma. Patient has been admitted to [facility] since 06/20/23. During her stay, she underwent ex-lap with distal small bowel resection and excisional biopsy of pelvic, abdominal wall, and omental masses. Patient subsequently diagnosed with colon adenocarcinoma. IR has been consulted to evaluate patient for port placement.  Past Medical History:  Diagnosis Date   Anemia    Hypertension    Pre-diabetes 02/2022    Past Surgical History:  Procedure Laterality Date   BREAST LUMPECTOMY WITH RADIOACTIVE SEED LOCALIZATION Left 06/10/2022   Procedure: LEFT BREAST LUMPECTOMY WITH RADIOACTIVE SEED LOCALIZATION;  Surgeon: Griselda Miner, MD;  Location: [facility];  Service: General;  Laterality: Left;   CESAREAN SECTION     x4   LAPAROTOMY N/A 06/30/2023   Procedure: EXPLORATORY LAPAROTOMY, SMALL BOWEL RESECTION, AND EXCISIONAL BIOPSIES OF MULTIPLE OMENTAL MASSES;  Surgeon: Berna Bue, MD;  Location: [facility];  Service: General;  Laterality: N/A;   TUBAL LIGATION      Allergies: Patient has no known allergies.  Medications: Prior to Admission medications   Medication Sig Start Date End Date Taking? Authorizing Provider  Ferric Maltol (ACCRUFER) 30 MG CAPS Take 1 capsule (30 mg total) by mouth daily. 03/05/23  Yes Arnette Felts, [name]  linaclotide [facility]) 290 MCG CAPS capsule Take 1 capsule (290 mcg total) by mouth 20 minutes before breakfast 05/09/23  Yes   Semaglutide (RYBELSUS) 7 MG TABS Take 1 tablet (7 mg total) by mouth daily. 01/13/23  Yes Arnette Felts, [name]  valsartan (DIOVAN) 160 MG tablet  Take 1 tablet (160 mg total) by mouth daily. 10/03/22 10/03/23 Yes Arnette Felts, [name]  amoxicillin (AMOXIL) 500 MG capsule Take 2 capsules (1,000 mg total) by mouth 2 (two) times daily for 14 days Patient not taking: Reported on 06/20/2023 05/19/23     clarithromycin (BIAXIN) 500 MG tablet Take 1 tablet (500 mg total) by mouth every 12 (twelve) hours for 14 days Patient not taking: Reported on 06/20/2023 05/19/23     ferrous sulfate 325 (65 FE) MG tablet Take 1 tablet (325 mg total) by mouth daily. Patient not taking: Reported on 03/05/2023 02/22/22   Dione Booze, MD  fluticasone [facility]) 50 MCG/ACT nasal spray Place 2 sprays into both nostrils daily. Patient not taking: Reported on 03/05/2023 07/17/22   Henderly, Britni A, PA-C  lansoprazole (PREVACID) 30 MG capsule Take 1 capsule (30 mg total) by mouth 2 (two) times daily for 14 days Patient not taking: Reported on 06/20/2023 05/19/23     Vitamin D, Ergocalciferol, (DRISDOL) 1.25 MG (50000 UNIT) CAPS capsule Take 1 capsule (50,000 Units total) by mouth 2 (two) times a week. 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  Tobacco Use   Smoking status: Never   Smokeless tobacco: Never  Vaping Use   Vaping status: Never Used  Substance and Sexual Activity   Alcohol use: No   Drug use: No   Sexual activity: Not Currently    Birth control/protection:  Surgical  Other Topics Concern   Not on file  Social History Narrative   Not on file   Social Determinants of Health   Financial Resource Strain: Not on file  Food Insecurity: No Food Insecurity (06/20/2023)   Hunger Vital Sign    Worried About Running Out of Food in the Last Year: Never true    Ran Out of Food in the Last Year: Never true  Transportation Needs: No Transportation Needs (06/20/2023)   PRAPARE - Administrator, Civil Service (Medical): No    Lack of Transportation (Non-Medical): No  Physical Activity: Not on file  Stress: Not on file  Social Connections: Not on file    Code Status: Full code  Review of Systems: A 12 point ROS discussed and pertinent positives are indicated in the HPI above.  All other systems are negative.  Review of Systems  Constitutional:  Negative for chills and fever.  Respiratory:  Negative for chest tightness and shortness of breath.   Cardiovascular:  Negative for chest pain and leg swelling.  Gastrointestinal:  Positive for abdominal pain. Negative for diarrhea, nausea and vomiting.  Neurological:  Negative for dizziness and headaches.  Psychiatric/Behavioral:  Negative for confusion.     Vital Signs: BP (!) 125/59 (BP Location: Left Arm)   Pulse 80   Temp 99 F (37.2 C) (Oral)   Resp 18   Ht 5\' 4"  (1.626 m)   Wt 204 lb 2.3 oz (92.6 kg)   SpO2 97%   BMI 35.04 kg/m     Physical Exam Vitals reviewed.  Constitutional:      General: She is not in acute distress.    Appearance: She is not ill-appearing.  HENT:     Mouth/Throat:     Mouth: Mucous membranes are moist.  Cardiovascular:     Rate and Rhythm: Normal rate and regular rhythm.     Pulses: Normal pulses.     Heart sounds: Normal heart sounds.  Pulmonary:     Effort: Pulmonary effort is normal.     Breath sounds: Normal breath sounds.  Abdominal:     Palpations: Abdomen is soft.     Tenderness: There is abdominal tenderness.  Musculoskeletal:      Right lower leg: No edema.     Left lower leg: No edema.  Skin:    General: Skin is warm and dry.  Neurological:     Mental Status: She is alert and oriented to person, place, and time.  Psychiatric:        Mood and Affect: Mood normal.        Behavior: Behavior normal.        Thought Content: Thought content normal.        Judgment: Judgment normal.     Imaging: MR LIVER W WO CONTRAST  Result Date: 07/03/2023 CLINICAL DATA:  Liver lesion.  History of colon cancer. EXAM: MRI ABDOMEN WITHOUT AND WITH CONTRAST TECHNIQUE:  Multiplanar multisequence MR imaging of the abdomen was performed both before and after the administration of intravenous contrast. CONTRAST:  9mL GADAVIST GADOBUTROL 1 MMOL/ML IV SOLN COMPARISON:  CT chest 07/03/2023. abdomen pelvis CT 06/22/2023. No older exams are available at this time FINDINGS: Lower chest: Trace pleural fluid.  Please see prior chest CT scan. Hepatobiliary: As seen on the prior CT scan there is a lesion in segment 7 measuring 18 x 16 mm on series 15, image 28. This has a aggressive features, target like appearance with mild bright T2 signal, low T1 signal and some heterogeneous enhancement. Lesion also shows abnormal diffusion and worrisome for metastatic deposit or other aggressive lesion. There are no other parenchymal liver lesions identified at this time. Patent portal vein. No biliary ductal dilatation. Gallbladder is distended. There is some layering sludge in the gallbladder or small stones. Pancreas: No mass, inflammatory changes, or other parenchymal abnormality identified. Spleen:  Within normal limits in size and appearance. Adrenals/Urinary Tract: Adrenal glands are preserved. No enhancing mass or collecting system dilatation. There is some mild atrophy along the lower aspect of the right kidney. No restricted diffusion in the kidney. Stomach/Bowel: As seen on CT scan there are some dilated fluid-filled loops of small bowel left midabdomen. The  visualized colon is nondilated. The stomach is nondilated. Vascular/Lymphatic: Normal caliber aorta and IVC. There are several small less than 1 cm in size retroperitoneal and mesenteric nodes. Not pathologic by size criteria but more numerous than usually seen. Other: Scattered ascites. Several mesenteric nodules identified. This includes the central mesenteric lesion seen previously immediately anterior to the third portion of the duodenal on the right side measuring 2.9 by 2.6 cm. Several other areas identified as well. Again these were seen on the prior CT. Some of the lesions could be mesenteric lymph nodes versus peritoneal nodules. The larger complex mass in the pelvis is not included in the imaging field on today's examination for the abdomen Musculoskeletal: Curvature of the spine. Mild degenerative changes. Anasarca. IMPRESSION: Solitary segment 7 hepatic metastasis.  Target like lesion. Peritoneal nodules identified as seen on prior examination with the ascites. Please correlate for peritoneal spread of disease on the prior CT scan. Persistent dilatation of the loops of small bowel in the midabdomen. Electronically Signed   By: Karen Kays M.D.   On: 07/03/2023 15:33   CT CHEST WO CONTRAST  Result Date: 07/03/2023 CLINICAL DATA:  Colon cancer staging * Tracking Code: BO * EXAM: CT CHEST WITHOUT CONTRAST TECHNIQUE: Multidetector CT imaging of the chest was performed following the standard protocol without IV contrast. RADIATION DOSE REDUCTION: This exam was performed according to the departmental dose-optimization program which includes automated exposure control, adjustment of the mA and/or kV according to patient size and/or use of iterative reconstruction technique. COMPARISON:  CT abdomen pelvis, 06/22/2023 FINDINGS: Cardiovascular: Right upper extremity PICC. Normal heart size. No pericardial effusion. Mediastinum/Nodes: No enlarged mediastinal, hilar, or axillary lymph nodes. Thyroid gland,  trachea, and esophagus demonstrate no significant findings. Lungs/Pleura: Bandlike scarring and atelectasis of the dependent right upper lobe (series 8, image 45) and dependent right lower lobe (series 8, image 81). Mild, bandlike scarring of the left lung base. No pleural effusion or pneumothorax. Upper Abdomen: No acute abnormality. Previously described hypodense liver lesion is not clearly assessed on this noncontrast examination of the chest. Musculoskeletal: No chest wall abnormality. No acute osseous findings. IMPRESSION: 1. No evidence of metastatic disease in the chest. 2. Bandlike scarring and atelectasis of the  dependent right upper lobe and dependent right lower lobe. Mild, bandlike scarring of the left lung base. Electronically Signed   By: Jearld Lesch M.D.   On: 07/03/2023 14:07   CT BIOPSY  Result Date: 06/24/2023 INDICATION: 57 year old with peritoneal lesions and needs a tissue diagnosis. EXAM: CT-GUIDED CORE BIOPSY OF A PERITONEAL NODULE MEDICATIONS: Moderate sedation ANESTHESIA/SEDATION: Moderate (conscious) sedation was employed during this procedure. A total of Versed 1.0 mg and Fentanyl 50 mcg was administered intravenously by the radiology nurse. Total intra-service moderate Sedation Time: 18 minutes. The patient's level of consciousness and vital signs were monitored continuously by radiology nursing throughout the procedure under my direct supervision. FLUOROSCOPY TIME:  None COMPLICATIONS: None immediate. PROCEDURE: Informed written consent was obtained from the patient after a thorough discussion of the procedural risks, benefits and alternatives. All questions were addressed. A timeout was performed prior to the initiation of the procedure. Patient was placed supine on the CT scanner. Images through the abdomen were obtained. An anterior midline peritoneal nodule was identified near the umbilicus. The right side of the abdomen was prepped with chlorhexidine and sterile field was  created. Skin was anesthetized using 1% lidocaine. Using CT guidance, 17 gauge coaxial needle was directed into the peritoneal nodule. Needle position was confirmed within the nodule. Total of 3 core biopsies were obtained with an 18 gauge device. Specimens placed in formalin. Needle was removed and follow up CT images were obtained. Bandage placed over the puncture site. RADIATION DOSE REDUCTION: This exam was performed according to the departmental dose-optimization program which includes automated exposure control, adjustment of the mA and/or kV according to patient size and/or use of iterative reconstruction technique. FINDINGS: CT images demonstrate diffuse dilatation of small bowel loops compatible with small bowel obstruction and findings are similar to the previous diagnostic CT. Peritoneal nodule in the anterior midline just posterior to the umbilicus was targeted. Coaxial needle was confirmed within the nodule. Three adequate core specimens were obtained. IMPRESSION: CT-guided core biopsy of an anterior abdominal peritoneal nodule. Electronically Signed   By: Richarda Overlie M.D.   On: 06/24/2023 16:50   DG Fluoro Rm 1-60 Min - No Report  Result Date: 06/24/2023 Fluoroscopy was utilized by the requesting physician.  No radiographic interpretation.   [place] EKG SITE RITE  Result Date: 06/23/2023 If Site Rite image not attached, placement could not be confirmed due to current cardiac rhythm.  [place] EKG SITE RITE  Result Date: 06/23/2023 If Site Rite image not attached, placement could not be confirmed due to current cardiac rhythm.  CT ABDOMEN PELVIS W CONTRAST  Result Date: 06/22/2023 CLINICAL DATA:  Epigastric pain. Indeterminate right liver lesion on ultrasound. * Tracking Code: BO * EXAM: CT ABDOMEN AND PELVIS WITH CONTRAST TECHNIQUE: Multidetector CT imaging of the abdomen and pelvis was performed using the standard protocol following bolus administration of intravenous contrast. RADIATION DOSE  REDUCTION: This exam was performed according to the departmental dose-optimization program which includes automated exposure control, adjustment of the mA and/or kV according to patient size and/or use of iterative reconstruction technique. CONTRAST:  OMNIPAQUE IOHEXOL 300 MG/ML  SOLN COMPARISON:  Abdominal ultrasound 06/21/2023 and 10/29/2022. FINDINGS: Lower chest: Small right pleural effusion with dependent right lower lobe atelectasis. The left lung base is clear. Hepatobiliary: The liver is normal in density without definite signs of steatosis or morphologic changes of cirrhosis. There is an indeterminate hypodense lesion posteriorly in the right lobe measuring 1.3 x 1.3 cm on image 26/2, corresponding with  the ultrasound finding. Although nonspecific by this examination, this is worrisome for metastatic disease based on additional findings described below. Possible additional tiny lesion more inferiorly in the right lobe on image 34/2. No evidence of gallstones, gallbladder wall thickening or biliary dilatation. Pancreas: Unremarkable. No pancreatic ductal dilatation or surrounding inflammatory changes. Spleen: Normal in size without focal abnormality. Adrenals/Urinary Tract: Both adrenal glands appear normal. No evidence of urinary tract calculus, suspicious renal lesion or hydronephrosis. Mild renal cortical scarring on the right. The bladder appears unremarkable for its degree of distention. Stomach/Bowel: Enteric contrast was administered and has passed into the mid small bowel. The stomach and proximal small bowel appear normal. There are multiple moderately dilated loops of mid to distal small bowel with air-levels consistent with a distal small-bowel obstruction. Transition point is in the right lower quadrant and likely associated with a complex mass measuring approximately 4.7 x 3.0 cm on image 79/2. The distal small bowel is decompressed. The appendix is not well visualized, although  demonstrates no definite abnormality. The colon is decompressed without wall thickening or focal abnormality. There are mild diverticular changes throughout the descending and sigmoid colon. Vascular/Lymphatic: 2.9 x 2.2 cm mesenteric nodule on image 50/2 may reflect an enlarged lymph node. No other enlarged abdominal or pelvic lymph nodes. No significant vascular findings. No evidence of aneurysm or large vessel occlusion. Reproductive: The endometrium appears mildly thickened and heterogeneous, measuring up to 1.5 cm in thickness on sagittal image 66/6. No definite myometrial abnormality. There is a large heterogeneous mass superior to the cervix which measures 7.9 x 6.1 cm on image 81/2. This is not clearly related to either ovary. The right lower quadrant mass described above causing apparent distal small bowel obstruction may be related to the right ovary. No left ovarian mass identified. Other: No evidence of abdominal wall hernia, generalized ascites or pneumoperitoneum. There are multiple peritoneal nodules, highly suspicious for carcinomatosis. Representative lesions include a 2.1 cm lesion deep to the umbilicus on image 70/2, a 3.1 x 2.4 cm lesion in the right lower quadrant on image 71/2 and anterior left upper quadrant lesion measuring 2.0 x 1.4 cm lesion on image 53/2. Enhancing nodule measuring 2.8 cm in the left mid abdomen is associated with an adjacent cystic structure measuring up to 8.5 x 7.3 cm on image 64/2, possibly an area of loculated ascites. Additional pelvic masses are described above. Musculoskeletal: No acute or significant osseous findings. Mild degenerative changes of both hips. Unless specific follow-up recommendations are mentioned in the findings or impression sections, no imaging follow-up of any mentioned incidental findings is recommended. IMPRESSION: 1. Distal small-bowel obstruction with transition point in the right lower quadrant, likely associated with a complex mass in the  right lower quadrant. Patient may benefit from nasogastric tube decompression and general surgical consultation. 2. Multiple peritoneal nodules, highly suspicious for carcinomatosis. Dominant heterogeneous mass superior to the cervix is not clearly related to either ovary and could reflect a drop metastasis or atypical primary pelvic malignancy. Associated mildly thickened and heterogeneous endometrium. No definite other primary malignancy identified in the abdomen or pelvis. 3. Indeterminate hypodense lesion posteriorly in the right lobe of the liver, corresponding with the ultrasound finding, worrisome for metastatic disease based on the other findings. 4. Small right pleural effusion with dependent right lower lobe atelectasis. 5. Appropriate oncology referral recommended for additional diagnosis and staging. PET-CT may be helpful. Electronically Signed   By: Carey Bullocks M.D.   On: 06/22/2023 15:02   US  ABDOMEN LIMITED RUQ (LIVER/GB)  Result Date: 06/21/2023 CLINICAL DATA:  Epigastric pain for 3 days EXAM: ULTRASOUND ABDOMEN LIMITED RIGHT UPPER QUADRANT COMPARISON:  10/29/2022 FINDINGS: Gallbladder: No gallstones or wall thickening visualized. No sonographic Murphy sign noted by sonographer. Common bile duct: Diameter: 3 mm Liver: Diffuse increased liver echotexture compatible with hepatic steatosis. There is a focal area of fatty sparing near the gallbladder fossa. Within the right lobe liver there is an indistinct 2.5 x 2.3 x 1.8 cm hypoechoic region, nonspecific. No intrahepatic duct dilation. Portal vein is patent on color Doppler imaging with normal direction of blood flow towards the liver. Other: Small right pleural effusion incidentally noted. IMPRESSION: 1. Hepatic steatosis, with focal fatty sparing near the gallbladder fossa. 2. Indeterminate 2.5 cm hypoechoic region within the right lobe liver, not seen on prior imaging. When the patient is clinically stable and able to follow directions and  hold their breath (preferably as an outpatient) further evaluation with dedicated abdominal MRI should be considered. 3. Right pleural effusion. Electronically Signed   By: Sharlet Salina M.D.   On: 06/21/2023 16:40    Labs:  CBC: Recent Labs    07/04/23 0410 07/05/23 0250 07/07/23 0454 07/08/23 0321  WBC 18.5* 17.5* 16.2* 10.9*  HGB 8.2* 7.7* 8.1* 7.6*  HCT 27.7* 26.7* 27.8* 26.1*  PLT 368 383 458* 441*    COAGS: Recent Labs    06/20/23 1436 06/24/23 0942  INR 1.1 1.2    BMP: Recent Labs    07/04/23 0410 07/05/23 0250 07/06/23 0416 07/07/23 0454  NA 134* 136 136 134*  K 3.7 3.7 3.7 3.8  CL 101 101 101 97*  CO2 24 25 27 27   GLUCOSE 159* 131* 150* 110*  BUN 13 11 10 9   CALCIUM 8.1* 8.2* 8.5* 8.6*  CREATININE 0.72 0.57 0.55 0.77  GFRNONAA >60 >60 >60 >60    LIVER FUNCTION TESTS: Recent Labs    06/30/23 0322 07/01/23 0515 07/03/23 0354 07/07/23 0454  BILITOT 0.5 1.8* 0.9 0.6  AST 14* 15 13* 20  ALT 13 11 12 15   ALKPHOS 78 67 79 137*  PROT 7.2 6.4* 6.4* 7.3  ALBUMIN 3.1* 2.7* 2.4* 2.4*    TUMOR MARKERS: No results for input(s): "AFPTM", "CEA", "CA199", "CHROMGRNA" in the last 8760 hours.  Assessment and Plan:  Linda [name] is a 57yo female being seen today in relation to newly diagnosed colon adenocarcinoma. Patient has been referred to IR for image-guided port placement. Patient with slightly elevated WBC of 10.9 today, down from 16.2 yesterday. Patient is NPO. Case tentatively scheduled to proceed on 07/08/23 with Dr Elby Showers.  Risks and benefits of image guided port-a-catheter placement was discussed with the patient including, but not limited to bleeding, infection, pneumothorax, or fibrin sheath development and need for additional procedures.  All of the patient's questions were answered, patient is agreeable to proceed. Consent signed and in chart.   Thank you for this interesting consult.  I greatly enjoyed meeting Linda [name] and look  forward to participating in their care.  A copy of this report was sent to the requesting provider on this date.  Electronically Signed: Kennieth Francois, PA-C 07/08/2023, 10:45 AM   I spent a total of 20 Minutes  in face to face in clinical consultation, greater than 50% of which was counseling/coordinating care for colon adenocarcinoma.

## 2023-07-08 NOTE — Procedures (Signed)
Interventional Radiology Procedure Note  Procedure: Single Lumen Power Port Placement    Access:  Right internal jugular vein  Findings: Catheter tip positioned at cavoatrial junction. Port is ready for immediate use.   Complications: None  EBL: < 10 mL  Recommendations:  - Ok to shower in 24 hours - Do not submerge for 7 days - Routine line care    Marliss Coots, MD

## 2023-07-08 NOTE — Progress Notes (Signed)
Email to Jill Poling, [facility] pathology department requesting MMR testing on Accession:  [id number], c.

## 2023-07-08 NOTE — Progress Notes (Signed)
Patient ID: Catalina Lunger, female   DOB: May 23, 1966, 57 y.o.   MRN: [id number] [facility] Surgery Progress Note  8 Days Post-Op  Subjective: CC-  Still having some abdominal pain. States that she did well with lunch yesterday (tuna salad). She had some pain and nausea with breakfast and dinner. Unsure if it was the food or the Boost she was drinking at the same time. No emesis. Passing flatus, BM x2 last night. WBC down 10.9  She is NPO for possible port placement with IR  Objective: Vital signs in last 24 hours: Temp:  [98.5 F (36.9 C)-99.3 F (37.4 C)] 99 F (37.2 C) (09/17 0425) Pulse Rate:  [80-83] 80 (09/17 0425) Resp:  [15-18] 18 (09/17 0425) BP: (123-132)/(56-64) 125/59 (09/17 0425) SpO2:  [97 %-100 %] 97 % (09/17 0425) Last BM Date : 07/07/23  Intake/Output from previous day: 09/16 0701 - 09/17 0700 In: 10 [I.V.:10] Out: -  Intake/Output this shift: No intake/output data recorded.  PE: Gen:  Alert, NAD, pleasant Pulm:  rate and effort normal Abd: soft, mild distension, mild tenderness over incision, bowel sounds present, incision mostly  cdi with staples present/ inferior portion with 3 staples removed and scant remaining purulent drainage  Lab Results:  Recent Labs    07/07/23 0454 07/08/23 0321  WBC 16.2* 10.9*  HGB 8.1* 7.6*  HCT 27.8* 26.1*  PLT 458* 441*   BMET Recent Labs    07/06/23 0416 07/07/23 0454  NA 136 134*  K 3.7 3.8  CL 101 97*  CO2 27 27  GLUCOSE 150* 110*  BUN 10 9  CREATININE 0.55 0.77  CALCIUM 8.5* 8.6*   PT/INR No results for input(s): "LABPROT", "INR" in the last 72 hours. CMP     Component Value Date/Time   NA 134 (L) 07/07/2023 0454   NA 141 05/16/2022 1044   K 3.8 07/07/2023 0454   CL 97 (L) 07/07/2023 0454   CO2 27 07/07/2023 0454   GLUCOSE 110 (H) 07/07/2023 0454   BUN 9 07/07/2023 0454   BUN 9 05/16/2022 1044   CREATININE 0.77 07/07/2023 0454   CALCIUM 8.6 (L) 07/07/2023 0454   PROT 7.3 07/07/2023  0454   PROT 7.8 05/16/2022 1044   ALBUMIN 2.4 (L) 07/07/2023 0454   ALBUMIN 4.4 05/16/2022 1044   AST 20 07/07/2023 0454   ALT 15 07/07/2023 0454   ALKPHOS 137 (H) 07/07/2023 0454   BILITOT 0.6 07/07/2023 0454   BILITOT <0.2 05/16/2022 1044   GFRNONAA >60 07/07/2023 0454   Lipase     Component Value Date/Time   LIPASE 35 06/20/2023 1436       Studies/Results: No results found.  Anti-infectives: Anti-infectives (From admission, onward)    Start     Dose/Rate Route Frequency Ordered Stop   06/30/23 0830  ceFAZolin (ANCEF) IVPB 2g/100 mL premix        2 g 200 mL/hr over 30 Minutes Intravenous On call to O.R. 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 Mosetta Putt is following   POD#8 s/p ex lap, SBR and biopsy of multiple sites 9/9 Dr. Fredricka Bonine - Continue regular diet and protein shakes - the boost may be  causing her pain/nausea as she only tolerated lunch yesterday and that was the only meal she had without Boost. she does not like Ensure, she is asking her daughter to bring a different protein shake from outside the hospital to try. Encourage PO intake/ small, frequent meals - 3 staples removed from inferior portion of wound 9/16 for infection. Pack BID and PRN saturation. Shower with wound open. I have asked her nurse to teach her daughter how to do the dressing changes - WBC down 10.9 from 16.2 - mobilize  - port per IR  Surgical path: A. OMENTAL MASSES, EXCISION:  - Positive for adenocarcinoma  B. SOFT TISSUE, RIGHT LOWER QUADRANT MASS, EXCISION:  - Positive for adenocarcinoma  C. PORTION OF ILEUM, RESECTION:  - Adenocarcinoma,  moderately differentiated, pT4  - No lymph nodes identified  - Margins negative for carcinoma  - See oncology table  D. ABDOMINAL WALL CYST, LEFT, EXCISION:  - Ovarian tissue involved by adenocarcinoma    FEN: RD, Boost VTE: sqh ID: no current abx   - per TRH -  IDA T2DM HTN    LOS: 18 days    Franne Forts, [facility] Surgery 07/08/2023, 10:21 AM Please see Amion for pager number during day hours 7:00am-4:30pm

## 2023-07-08 NOTE — Progress Notes (Signed)
NGS testing ordered via [employer] on line portal.  Order [id number].

## 2023-07-08 NOTE — Plan of Care (Signed)

## 2023-07-09 ENCOUNTER — Inpatient Hospital Stay (HOSPITAL_COMMUNITY): Payer: BC Managed Care – PPO

## 2023-07-09 DIAGNOSIS — C179 Malignant neoplasm of small intestine, unspecified: Secondary | ICD-10-CM | POA: Diagnosis not present

## 2023-07-09 LAB — GLUCOSE, CAPILLARY
Glucose-Capillary: 103 mg/dL — ABNORMAL HIGH (ref 70–99)
Glucose-Capillary: 106 mg/dL — ABNORMAL HIGH (ref 70–99)
Glucose-Capillary: 127 mg/dL — ABNORMAL HIGH (ref 70–99)
Glucose-Capillary: 62 mg/dL — ABNORMAL LOW (ref 70–99)
Glucose-Capillary: 63 mg/dL — ABNORMAL LOW (ref 70–99)
Glucose-Capillary: 72 mg/dL (ref 70–99)
Glucose-Capillary: 85 mg/dL (ref 70–99)
Glucose-Capillary: 86 mg/dL (ref 70–99)
Glucose-Capillary: 99 mg/dL (ref 70–99)

## 2023-07-09 MED ORDER — INSULIN ASPART 100 UNIT/ML IJ SOLN
0.0000 [IU] | INTRAMUSCULAR | Status: DC
  Administered 2023-07-09 – 2023-07-11 (×3): 1 [IU] via SUBCUTANEOUS

## 2023-07-09 MED ORDER — INSULIN ASPART 100 UNIT/ML IJ SOLN: 0.0000 [IU] | Freq: Four times a day (QID) | INTRAMUSCULAR | Status: DC

## 2023-07-09 MED ORDER — DEXTROSE 50 % IV SOLN
25.0000 g | INTRAVENOUS | Status: AC
  Administered 2023-07-09: 25 g via INTRAVENOUS
  Filled 2023-07-09: qty 50

## 2023-07-09 MED ORDER — PIPERACILLIN-TAZOBACTAM 3.375 G IVPB
3.3750 g | Freq: Three times a day (TID) | INTRAVENOUS | Status: DC
  Administered 2023-07-09 – 2023-07-15 (×19): 3.375 g via INTRAVENOUS
  Filled 2023-07-09 (×19): qty 50

## 2023-07-09 MED ORDER — IOHEXOL 9 MG/ML PO SOLN
500.0000 mL | ORAL | Status: AC
  Administered 2023-07-09: 500 mL via ORAL

## 2023-07-09 MED ORDER — PROSOURCE PLUS PO LIQD
30.0000 mL | Freq: Two times a day (BID) | ORAL | Status: DC
  Administered 2023-07-10 – 2023-07-14 (×8): 30 mL via ORAL
  Filled 2023-07-09 (×6): qty 30

## 2023-07-09 MED ORDER — IOHEXOL 300 MG/ML  SOLN
100.0000 mL | Freq: Once | INTRAMUSCULAR | Status: AC | PRN
  Administered 2023-07-09: 100 mL via INTRAVENOUS

## 2023-07-09 MED ORDER — TRAVASOL 10 % IV SOLN
INTRAVENOUS | Status: AC
  Filled 2023-07-09: qty 451.2

## 2023-07-09 NOTE — Plan of Care (Signed)

## 2023-07-09 NOTE — Progress Notes (Signed)
Progress Note    Linda [name]   WUJ:[id number]  DOB: [birth year]  DOA: 06/20/2023     19 PCP: Arnette Felts, [name]  Initial CC: dark stools, abdominal pain  Hospital Course: Linda [name] is a 57 yo female with PMH anemia, HTN, pre-DM who presented with abdominal pain.  She recently underwent EGD on 05/08/2023.  She was started on H. pylori treatment after procedure.  She completed treatment course approximately 2 weeks prior to admission. She has also been on oral iron and having dark stools. Due to worsening weakness and ongoing abdominal discomfort, she presented to the ER for further evaluation. Hemoglobin was found to be 5.9 g/dL.  She was admitted for blood transfusion and GI evaluation. She had recent colonoscopy and upper endoscopy performed June 2023.  She was not recommended for repeat scopes.  RUQ ultrasound showed hepatic steatosis and hypoechoic region in the right lobe liver.  Due to abdominal pain on admission, further workup commenced with CT abdomen/pelvis in case of gallbladder pathology. CT was notable for multiple findings.  There was a large mass measuring 4.7 x 3 cm noted in the right lower quadrant and also large heterogenous mass superior to the cervix measuring 7.9 x 6.1 cm. Multiple dilated loops of bowel concerning for compression/obstruction were also appreciated. She also has a recent history over the past year of dysfunctional uterine bleeding and thickened endometrium noted on outpatient ultrasound and again seen on CT.  She had declined outpatient endometrial biopsy.  Due to these findings, further workup commenced with consulting surgery, oncology, and gyn-oncology. Tumor markers ordered as well.  On 9/9, she ultimately underwent ex-lap with distal small bowel resection with primary anastomosis along with excisional biopsy of pelvic, multiple abdominal wall and omental masses.  Interval History:  Plan for CT scan today  Assessment and Plan: Colon  adenocarcinoma (HCC) - per CT there is 4.7 x 3 cm mass in RLL and mass superior to cervix 7.9 x 6.1 cm. Known thickened endometrium again seen and recent hx DUB that was being monitored outpatient.  - initial concern was for GYN malignancy; this has now been ruled out after ex-lap and pathology results peritoneal nodule needle core biopsy from 06/24/2023; endometrial biopsy also negative from 06/24/2023 -Patient underwent ex lap with distal small bowel resection with primary anastomosis, excisional biopsy of pelvic, multiple abdominal wall and omental masses on 06/30/2023 - general surgery following- plan CT scan on 9/18 - oncology also now on board and following for further outpatient plans - PAC placed by IR on 07/08/23  SBO (small bowel obstruction) (HCC) - Presented with distended abdomen and decreased bowel sounds.  Initially suspected constipation contributing but after CT, concern is now for underlying obstruction which may be due to mass effect - general surgery following as well - now s/p ex-lap with small bowel resection with primary anastomosis and excisional biopsy of pelvic, multi abdominal wall, and omental masses on 06/30/2023 - diet still slowly advancing; likely needs 2-3 more days to allow further advancement and tolerance   Microcytic anemia - Multifactorial etiology at this point.  Overt iron deficiency on admission, newly diagnosed malignancy, and some expected blood loss from recent surgery on 06/30/2023 - Hgb 8.1 g/dL this morning  -Still needs DVT prophylaxis with HSQ given high risk of hypercoagulability/VTE; also needs Khorana score (currently at least 3 points, high risk) calculated prior to discharge and/or before treatment most likely   IDA (iron deficiency anemia) - on admission: iron labs: Iron 9,  sat ratio 2%, TIBC 444. Ferritin 8 - s/p 4 units PRBC so far since admission and received 500 mg Ferrlecit on admission - repeat iron studies on 9/13 still low (mild improvement).  Will give further repletion with Ferrlecit x 4 days  Upper GI bleed-resolved as of 07/03/2023 - Possibly slow/chronic blood loss from underlying "chronic inactive gastritis" noted on EGD from 05/08/2023.  No H. pylori organisms were noted on immunohistochemical stain but due to pattern of inflammation, she was started on H. pylori treatment which she completed approximately 2 weeks prior to admission -Stools were melanotic in appearance but she states she has also been on oral iron daily -Hemoglobin certainly low on admission, 5.9 g/dL.   - now also s/p ex-lap with presumed some blood loss as expected - will continue to trend H/H and transfuse as necessary - no further scopes per GI at this time  Type 2 diabetes mellitus with obesity (HCC) - Last A1c 6.5% on 12/05/2022 - SSI ordered in setting of TPN use  - once TPN fully off, will d/c SSI  Essential hypertension - hold ARB in setting of above - PRN meds to be added; can make scheduled if needed    DVT prophylaxis:  heparin injection 5,000 Units Start: 07/01/23 1000 SCDs Start: 06/20/23 2059   Code Status:   Code Status: Full Code  Mobility Assessment (Last 72 Hours)     Mobility Assessment     Row Name 07/09/23 0857 07/08/23 2049 07/08/23 0859 07/07/23 2100 07/07/23 1012   Does patient have an order for bedrest or is patient medically unstable No - Continue assessment No - Continue assessment No - Continue assessment No - Continue assessment No - Continue assessment   What is the highest level of mobility based on the progressive mobility assessment? Level 6 (Walks independently in room and hall) - Balance while walking in room without assist - Complete Level 6 (Walks independently in room and hall) - Balance while walking in room without assist - Complete Level 6 (Walks independently in room and hall) - Balance while walking in room without assist - Complete Level 6 (Walks independently in room and hall) - Balance while walking in  room without assist - Complete Level 6 (Walks independently in room and hall) - Balance while walking in room without assist - Complete    Row Name 07/06/23 2100           Does patient have an order for bedrest or is patient medically unstable No - Continue assessment       What is the highest level of mobility based on the progressive mobility assessment? Level 6 (Walks independently in room and hall) - Balance while walking in room without assist - Complete                Barriers to discharge: None Disposition Plan: Home Status is: Inpatient  Objective: Blood pressure (!) 108/53, pulse 86, temperature 98.6 F (37 C), temperature source Oral, resp. rate 17, height 5\' 4"  (1.626 m), weight 92.6 kg, SpO2 94%.  Examination:     General: Appearance:    Obese female in no acute distress     Lungs:     respirations unlabored  Heart:    Normal heart rate. Normal rhythm. No murmurs, rubs, or gallops.   MS:   All extremities are intact.   Neurologic:   Awake, alert    Consultants:  GI - signed off General surgery Gyn-onc - signed off Oncology IR  Procedures:  06/30/23: Exploratory laparotomy, distal small bowel resection with primary anastomosis, excisional biopsy of pelvic, multiple abdominal wall and omental masses   Data Reviewed: Results for orders placed or performed during the hospital encounter of 06/20/23 (from the past 24 hour(s))  Glucose, capillary     Status: Abnormal   Collection Time: 07/08/23  4:07 PM  Result Value Ref Range   Glucose-Capillary 56 (L) 70 - 99 mg/dL  Glucose, capillary     Status: None   Collection Time: 07/08/23 10:38 PM  Result Value Ref Range   Glucose-Capillary 86 70 - 99 mg/dL  CBC with Differential/Platelet     Status: Abnormal   Collection Time: 07/09/23  3:28 AM  Result Value Ref Range   WBC 18.6 (H) 4.0 - 10.5 K/uL   RBC 3.58 (L) 3.87 - 5.11 MIL/uL   Hemoglobin 8.9 (L) 12.0 - 15.0 g/dL   HCT 01.0 (L) 27.2 - 53.6 %   MCV 84.4  80.0 - 100.0 fL   MCH 24.9 (L) 26.0 - 34.0 pg   MCHC 29.5 (L) 30.0 - 36.0 g/dL   RDW 64.4 (H) 03.4 - 74.2 %   Platelets 502 (H) 150 - 400 K/uL   nRBC 0.0 0.0 - 0.2 %   Neutrophils Relative % 82 %   Neutro Abs 15.2 (H) 1.7 - 7.7 K/uL   Lymphocytes Relative 9 %   Lymphs Abs 1.7 0.7 - 4.0 K/uL   Monocytes Relative 7 %   Monocytes Absolute 1.3 (H) 0.1 - 1.0 K/uL   Eosinophils Relative 0 %   Eosinophils Absolute 0.1 0.0 - 0.5 K/uL   Basophils Relative 0 %   Basophils Absolute 0.1 0.0 - 0.1 K/uL   Immature Granulocytes 2 %   Abs Immature Granulocytes 0.30 (H) 0.00 - 0.07 K/uL   Polychromasia PRESENT   Glucose, capillary     Status: Abnormal   Collection Time: 07/09/23  5:14 AM  Result Value Ref Range   Glucose-Capillary 106 (H) 70 - 99 mg/dL  Glucose, capillary     Status: Abnormal   Collection Time: 07/09/23 10:53 AM  Result Value Ref Range   Glucose-Capillary 63 (L) 70 - 99 mg/dL  Glucose, capillary     Status: None   Collection Time: 07/09/23 11:27 AM  Result Value Ref Range   Glucose-Capillary 72 70 - 99 mg/dL    I have reviewed pertinent nursing notes, vitals, labs, and images as necessary. I have ordered labwork to follow up on as indicated.  I have reviewed the last notes from staff over past 24 hours. I have discussed patient's care plan and test results with nursing staff, CM/SW, and other staff as appropriate.    LOS: 19 days   Joseph Art, DO [facility] 07/09/2023, 12:58 PM

## 2023-07-09 NOTE — Progress Notes (Signed)
Nutrition Follow-up  INTERVENTION:   -TPN management per Pharmacy -Resume daily weights while on TPN -Adjusted protein needs below  -Monitor for diet advancement again -D/c Boost Breeze as it was felt this was not tolerated by patient  NUTRITION DIAGNOSIS:   Inadequate oral intake related to chronic illness as evidenced by per patient/family report.  Ongoing.  GOAL:   Patient will meet greater than or equal to 90% of their needs  Not meeting but TPN restarting.  MONITOR:   PO intake, Supplement acceptance, Labs, Weight trends, I & O's  REASON FOR ASSESSMENT:   Consult New TPN/TNA  ASSESSMENT:   57 yo female with PMH anemia, HTN, pre-DM who presented with abdominal pain.  She recently underwent EGD on 05/08/2023.  She was started on H. pylori treatment after procedure.  She completed treatment course approximately 2 weeks prior to admission.  She has also been on oral iron and having dark stools. She was admitted for blood transfusion and GI evaluation.  8/30: admitted, NPO-> CLD 8/31: NPO -> regular diet 9/1: Heart healthy diet -> NPO->CLD 9/2: TPN initiated, NPO 9/9: s/p Exploratory laparotomy, distal small bowel resection with primary anastomosis, excisional biopsy of pelvic, multiple abdominal wall and omental masses 9/12: CLD 9/13: FLD 9/15: Regular diet  Patient now NPO, patient having intermittent stomach pain. Ate tuna for lunch yesterday and is felt by surgery and the pt that the Boost Breeze may be upsetting her stomach. Will d/c as TPN is now being restarted. Will consider other protein options upon diet advancement. Noted Prosource was ordered by surgery, can start there.  TPN restarting at 40 ml/hr tonight, providing 910 kcals and 45g protein.   Admission weight: 206 lbs Last weight 9/15: 204 lbs Would restart daily weights.  Medications: Colace, Miralax  Labs reviewed: CBGs: 56-106   Diet Order:   Diet Order             Diet NPO time specified  Except for: Sips with Meds  Diet effective midnight                   EDUCATION NEEDS:   No education needs have been identified at this time  Skin:  Skin Assessment: Skin Integrity Issues: Skin Integrity Issues:: Incisions Incisions: 9/9 abdomen  Last BM:  9/16  Height:   Ht Readings from Last 1 Encounters:  06/20/23 5\' 4"  (1.626 m)    Weight:   Wt Readings from Last 1 Encounters:  07/06/23 92.6 kg    BMI:  Body mass index is 35.04 kg/m.  Estimated Nutritional Needs:   Kcal:  1700-1900  Protein:  85-100g  Fluid:  1.9L/day   Tilda Franco, MS, RD, LDN Inpatient Clinical Dietitian Contact information available via Amion

## 2023-07-09 NOTE — Progress Notes (Signed)
Patient ID: Linda [name], female   DOB: [birth year], 57 y.o.   MRN: [id number] [facility] Surgery Progress Note  9 Days Post-Op  Subjective: CC-  Continues to have intermittent abdominal pain. Pain is mostly in the lower abdomen. Some nausea, no emesis. Feels full quickly. She was able to eat tuna again yesterday, but did not tolerate the Boost. Passing more flatus, small BM this morning. WBC up 18.6, TMAX 100.1.  Objective: Vital signs in last 24 hours: Temp:  [98.6 F (37 C)-100.1 F (37.8 C)] 98.6 F (37 C) (09/18 9563) Pulse Rate:  [86-105] 86 (09/18 0613) Resp:  [9-30] 17 (09/18 0613) BP: (108-163)/(53-81) 108/53 (09/18 0613) SpO2:  [94 %-100 %] 94 % (09/18 8756) Last BM Date : 07/07/23  Intake/Output from previous day: No intake/output data recorded. Intake/Output this shift: No intake/output data recorded.  PE:  Gen:  Alert, NAD, pleasant Pulm:  rate and effort normal Abd: soft, mild distension, mild tenderness over incision, few bowel sounds present, incision mostly cdi with staples present/ inferior portion open with scant purulent drainage, 1 more staple removed this morning  Lab Results:  Recent Labs    07/08/23 0321 07/09/23 0328  WBC 10.9* 18.6*  HGB 7.6* 8.9*  HCT 26.1* 30.2*  PLT 441* 502*   BMET Recent Labs    07/07/23 0454  NA 134*  K 3.8  CL 97*  CO2 27  GLUCOSE 110*  BUN 9  CREATININE 0.77  CALCIUM 8.6*   PT/INR No results for input(s): "LABPROT", "INR" in the last 72 hours. CMP     Component Value Date/Time   NA 134 (L) 07/07/2023 0454   NA 141 05/16/2022 1044   K 3.8 07/07/2023 0454   CL 97 (L) 07/07/2023 0454   CO2 27 07/07/2023 0454   GLUCOSE 110 (H) 07/07/2023 0454   BUN 9 07/07/2023 0454   BUN 9 05/16/2022 1044   CREATININE 0.77 07/07/2023 0454   CALCIUM 8.6 (L) 07/07/2023 0454   PROT 7.3 07/07/2023 0454   PROT 7.8 05/16/2022 1044   ALBUMIN 2.4 (L) 07/07/2023 0454   ALBUMIN 4.4 05/16/2022 1044   AST 20  07/07/2023 0454   ALT 15 07/07/2023 0454   ALKPHOS 137 (H) 07/07/2023 0454   BILITOT 0.6 07/07/2023 0454   BILITOT <0.2 05/16/2022 1044   GFRNONAA >60 07/07/2023 0454   Lipase     Component Value Date/Time   LIPASE 35 06/20/2023 1436       Studies/Results: IR IMAGING GUIDED PORT INSERTION  Result Date: 07/08/2023 INDICATION: 57 year old female with history of colon cancer presenting for Port-A-Cath placement. EXAM: IMPLANTED PORT A CATH PLACEMENT WITH ULTRASOUND AND FLUOROSCOPIC GUIDANCE COMPARISON:  None Available. MEDICATIONS: None. ANESTHESIA/SEDATION: Moderate (conscious) sedation was employed during this procedure. A total of Versed 2 mg and Fentanyl 100 mcg was administered intravenously. Moderate Sedation Time: 15 minutes. The patient's level of consciousness and vital signs were monitored continuously by radiology nursing throughout the procedure under my direct supervision. CONTRAST:  None FLUOROSCOPY TIME:  Five mGy COMPLICATIONS: None immediate. PROCEDURE: The procedure, risks, benefits, and alternatives were explained to the patient. Questions regarding the procedure were encouraged and answered. The patient understands and consents to the procedure. The right neck and chest were prepped with chlorhexidine in a sterile fashion, and a sterile drape was applied covering the operative field. Maximum barrier sterile technique with sterile gowns and gloves were used for the procedure. A timeout was performed prior to the initiation of the procedure. Ultrasound  was used to examine the jugular vein which was compressible and free of internal echoes. A skin marker was used to demarcate the planned venotomy and port pocket incision sites. Local anesthesia was provided to these sites and the subcutaneous tunnel track with 1% lidocaine with 1:100,000 epinephrine. A small incision was created at the jugular access site and blunt dissection was performed of the subcutaneous tissues. Under  ultrasound guidance, the jugular vein was accessed with a 21 ga micropuncture needle and an 0.018" wire was inserted to the superior vena cava. Real-time ultrasound guidance was utilized for vascular access including the acquisition of a permanent ultrasound image documenting patency of the accessed vessel. A 5 Fr micopuncture set was then used, through which a 0.035" Rosen wire was passed under fluoroscopic guidance into the inferior vena cava. An 8 Fr dilator was then placed over the wire. A subcutaneous port pocket was then created along the upper chest wall utilizing a combination of sharp and blunt dissection. The pocket was irrigated with sterile saline, packed with gauze, and observed for hemorrhage. A single lumen plastic power injectable port was chosen for placement. The 8 Fr catheter was tunneled from the port pocket site to the venotomy incision. The port was placed in the pocket. The external catheter was trimmed to appropriate length. The dilator was exchanged for an 8 Fr peel-away sheath under fluoroscopic guidance. The catheter was then placed through the sheath and the sheath was removed. Final catheter positioning was confirmed and documented with a fluoroscopic spot radiograph. The port was accessed with a Huber needle, aspirated, and flushed with heparinized saline. The deep dermal layer of the port pocket incision was closed with interrupted 3-0 Vicryl suture. The skin was opposed with a running subcuticular 4-0 Monocryl suture. Dermabond was then placed over the port pocket and neck incisions. The patient tolerated the procedure well without immediate post procedural complication. FINDINGS: After catheter placement, the tip lies within the superior cavoatrial junction. The catheter aspirates and flushes normally and is ready for immediate use. IMPRESSION: Successful placement of a power injectable Port-A-Cath via the right internal jugular vein. The catheter is ready for immediate use. Marliss Coots, MD [facility] Radiology Electronically Signed   By: Marliss Coots M.D.   On: 07/08/2023 15:15    Anti-infectives: Anti-infectives (From admission, onward)    Start     Dose/Rate Route Frequency Ordered Stop   06/30/23 0830  ceFAZolin (ANCEF) IVPB 2g/100 mL premix        2 g 200 mL/hr over 30 Minutes Intravenous On call to O.R. 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 Mosetta Putt is following Port placed 9/17   POD#9 s/p ex lap, SBR and biopsy of multiple sites 9/9 Dr. Fredricka Bonine - Seems to still have some ileus. No emesis and is having some bowel function, but feels full quickly with nausea and poor PO intake. Worsening leukocytosis today. Will obtain CT scan. Make NPO for now. Restart TPN. - 3 staples removed from inferior portion of wound 9/16 for infection, additional 1 staple removed 9/18. Pack BID and PRN saturation. Shower with wound open. - mobilize    Surgical path:  A. OMENTAL MASSES, EXCISION:  - Positive for adenocarcinoma  B. SOFT TISSUE, RIGHT LOWER QUADRANT MASS, EXCISION:  - Positive for adenocarcinoma  C. PORTION OF ILEUM, RESECTION:  - Adenocarcinoma, moderately differentiated, pT4  - No lymph nodes identified  - Margins negative for carcinoma  - See oncology table  D. ABDOMINAL WALL CYST, LEFT, EXCISION:  - Ovarian tissue involved by adenocarcinoma    FEN: RD, Boost VTE: sqh ID: no current abx   - per TRH -  IDA T2DM HTN    LOS: 19 days    Franne Forts, [facility] Surgery 07/09/2023, 9:35 AM Please see Amion for pager number during day hours  7:00am-4:30pm

## 2023-07-09 NOTE — Plan of Care (Signed)
  Problem: Education: Goal: Knowledge of General Education information will improve Description: Including pain rating scale, medication(s)/side effects and non-pharmacologic comfort measures Outcome: Progressing   Problem: Health Behavior/Discharge Planning: Goal: Ability to manage health-related needs will improve Outcome: Progressing   Problem: Clinical Measurements: Goal: Diagnostic test results will improve Outcome: Progressing   Problem: Activity: Goal: Risk for activity intolerance will decrease Outcome: Progressing   Problem: Coping: Goal: Level of anxiety will decrease Outcome: Progressing   Problem: Pain Managment: Goal: General experience of comfort will improve Outcome: Progressing   Problem: Safety: Goal: Ability to remain free from injury will improve Outcome: Progressing   Problem: Skin Integrity: Goal: Risk for impaired skin integrity will decrease Outcome: Progressing

## 2023-07-09 NOTE — Progress Notes (Signed)
PHARMACY - TOTAL PARENTERAL NUTRITION CONSULT NOTE   Indication: Small bowel obstruction  Patient Measurements: Height: 5\' 4"  (162.6 cm) Weight: 92.6 kg (204 lb 2.3 oz) IBW/kg (Calculated) : 54.7 TPN AdjBW (KG): 64.5 Body mass index is 35.04 kg/m.  Assessment:  Pharmacy consulted to start TPN on 57 yo female diagnosed with small bowel obstruction. S/p exploratory laparotomy on 9/9 and now awaiting return of bowel function.   9/18: Resume TPN due to persistent ileus with abdominal pain, nausea, decreased po intake and early satiety.   Glucose / Insulin: pre-DM on semaglutide PTA; last A1c 6.5 in Feb 2024 - Resume CBG check and SSI on 9/18  Electrolytes: Most WNL on 9/16. CoCa 9.88 Renal: SCr, BUN stable WNL; UOP not charted Hepatic: Alkphos high - Albumin low  - Triglycerides trending up 92>>153>>176 (9/16)  I/O:  - No mIVF - po: Boost x 1 on 9/17, No po intake noted 9/16 or 9/17 (but see MD notes noting food/meals) - UOP: unmeasured - LBM: 9/16  GI Imaging: - 9/1 Distal small-bowel obstruction with transition point in the right lower quadrant, likely associated with a complex mass in the right lower quadrant. - 9/12 MRI liver: persistently dilated SB loops; possible peritoneal mets; solitary 2 cm metastatic mass in liver. GI Surgeries / Procedures:  - 9/9 exploratory laparotomy with possible bowel obstruction resection/G-tube placement: Right sided pelvic mass, small bowel resection. Multiple omental and abdominal wall excisional biopsies. (+ for adenocarcinoma)   Central access: PICC placed for TPN 9/2  TPN start date: 9/2>>9/16, resume 9/18>>  Nutritional Goals: Goal TPN rate is 80 mL/hr (provides ~ 90 g of protein and  1820 kcals per day)  RD Assessment: Estimated Needs Total Energy Estimated Needs: 1700-1900 Total Protein Estimated Needs: 80-90g Total Fluid Estimated Needs: 1.9L/day  Current Nutrition:  Back to NPO 9/18: Restart TPN for persistent  ileus   Plan: At 1800: Resume TPN at 32ml/hr at previous formula Electrolytes in TPN:  Na 110 mEq/L K 60 mEq/L Ca 5 mEq/L Mg 5 mEq/L Phos 7 mmol/L  Cl:Ac Max Cl Add standard MVI and trace elements to TPN Chromium on hold d/t critical shortage Continue moderate scale SSI with q6 hr  Monitor TPN labs on Mon/Thurs and PRN - f/u A1C level  Aidyn Kellis S. Merilynn Finland, PharmD, BCPS Clinical Staff Pharmacist [email or web address] 07/09/2023, 9:32 AM

## 2023-07-10 DIAGNOSIS — C179 Malignant neoplasm of small intestine, unspecified: Secondary | ICD-10-CM | POA: Diagnosis not present

## 2023-07-10 DIAGNOSIS — C172 Malignant neoplasm of ileum: Secondary | ICD-10-CM | POA: Insufficient documentation

## 2023-07-10 LAB — CBC WITH DIFFERENTIAL/PLATELET
Abs Immature Granulocytes: 0.33 10*3/uL — ABNORMAL HIGH (ref 0.00–0.07)
Basophils Absolute: 0 10*3/uL (ref 0.0–0.1)
Basophils Relative: 0 %
Eosinophils Absolute: 0.2 10*3/uL (ref 0.0–0.5)
Eosinophils Relative: 1 %
HCT: 26.4 % — ABNORMAL LOW (ref 36.0–46.0)
Hemoglobin: 7.7 g/dL — ABNORMAL LOW (ref 12.0–15.0)
Immature Granulocytes: 2 %
Lymphocytes Relative: 6 %
Lymphs Abs: 1.1 10*3/uL (ref 0.7–4.0)
MCH: 25 pg — ABNORMAL LOW (ref 26.0–34.0)
MCHC: 29.2 g/dL — ABNORMAL LOW (ref 30.0–36.0)
MCV: 85.7 fL (ref 80.0–100.0)
Monocytes Absolute: 0.9 10*3/uL (ref 0.1–1.0)
Monocytes Relative: 5 %
Neutro Abs: 15.5 10*3/uL — ABNORMAL HIGH (ref 1.7–7.7)
Neutrophils Relative %: 86 %
Platelets: 411 10*3/uL — ABNORMAL HIGH (ref 150–400)
RBC: 3.08 MIL/uL — ABNORMAL LOW (ref 3.87–5.11)
RDW: 28.6 % — ABNORMAL HIGH (ref 11.5–15.5)
WBC: 18 10*3/uL — ABNORMAL HIGH (ref 4.0–10.5)
nRBC: 0 % (ref 0.0–0.2)

## 2023-07-10 LAB — GLUCOSE, CAPILLARY
Glucose-Capillary: 101 mg/dL — ABNORMAL HIGH (ref 70–99)
Glucose-Capillary: 112 mg/dL — ABNORMAL HIGH (ref 70–99)
Glucose-Capillary: 107 mg/dL — ABNORMAL HIGH (ref 70–99)
Glucose-Capillary: 106 mg/dL — ABNORMAL HIGH (ref 70–99)
Glucose-Capillary: 117 mg/dL — ABNORMAL HIGH (ref 70–99)

## 2023-07-10 LAB — COMPREHENSIVE METABOLIC PANEL
ALT: 29 U/L (ref 0–44)
AST: 41 U/L (ref 15–41)
Albumin: 1.9 g/dL — ABNORMAL LOW (ref 3.5–5.0)
Alkaline Phosphatase: 125 U/L (ref 38–126)
Anion gap: 7 (ref 5–15)
BUN: 7 mg/dL (ref 6–20)
CO2: 25 mmol/L (ref 22–32)
Calcium: 7.8 mg/dL — ABNORMAL LOW (ref 8.9–10.3)
Chloride: 101 mmol/L (ref 98–111)
Creatinine, Ser: 0.75 mg/dL (ref 0.44–1.00)
GFR, Estimated: >60 mL/min (ref >60)
Glucose, Bld: 115 mg/dL — ABNORMAL HIGH (ref 70–99)
Potassium: 3.7 mmol/L (ref 3.5–5.1)
Sodium: 133 mmol/L — ABNORMAL LOW (ref 135–145)
Total Bilirubin: 0.4 mg/dL (ref 0.3–1.2)
Total Protein: 6.5 g/dL (ref 6.5–8.1)

## 2023-07-10 LAB — HEMOGLOBIN A1C
Hgb A1c MFr Bld: 5.5 % (ref 4.8–5.6)
Mean Plasma Glucose: 111 mg/dL

## 2023-07-10 LAB — MAGNESIUM: Magnesium: 2.2 mg/dL (ref 1.7–2.4)

## 2023-07-10 LAB — SURGICAL PATHOLOGY

## 2023-07-10 LAB — PHOSPHORUS: Phosphorus: 3.6 mg/dL (ref 2.5–4.6)

## 2023-07-10 MED ORDER — HEPARIN SODIUM (PORCINE) 5000 UNIT/ML IJ SOLN
5000.0000 [IU] | Freq: Three times a day (TID) | INTRAMUSCULAR | Status: DC
  Administered 2023-07-11 – 2023-07-16 (×15): 5000 [IU] via SUBCUTANEOUS
  Filled 2023-07-10 (×15): qty 1

## 2023-07-10 MED ORDER — TRAVASOL 10 % IV SOLN
INTRAVENOUS | Status: AC
  Filled 2023-07-10: qty 902.4

## 2023-07-10 NOTE — Progress Notes (Signed)
PHARMACY - TOTAL PARENTERAL NUTRITION CONSULT NOTE   Indication: Small bowel obstruction  Patient Measurements: Height: 5\' 4"  (162.6 cm) Weight: 94.1 kg (207 lb 8 oz) IBW/kg (Calculated) : 54.7 TPN AdjBW (KG): 64.5 Body mass index is 35.62 kg/m.  Assessment:  Pharmacy consulted to start TPN on 57 yo female diagnosed with small bowel obstruction. S/p exploratory laparotomy on 9/9 and now awaiting return of bowel function.   9/18: Resume TPN due to persistent ileus with abdominal pain, nausea, decreased po intake and early satiety.   Glucose / Insulin: pre-DM on semaglutide PTA; last A1c 6.5 in Feb 2024>>down to 5.5 - CBGs 86-112, 1 unit SSI  Electrolytes: Na 133 slightly low, CoCa 9.48  Renal: SCr and BUN stable WNL; UOP not measured  Hepatic: Alkphos normalized - Albumin low down to 1.9 - Triglycerides trending up 92>>153>>176 (9/16)  I/O:  - No mIVF - Back to NPO 9/18 - UOP: unmeasured - LBM: 9/16  GI Imaging: - 9/1 Distal small-bowel obstruction with transition point in the right lower quadrant, likely associated with a complex mass in the right lower quadrant. - 9/12 MRI liver: persistently dilated SB loops; possible peritoneal mets; solitary 2 cm metastatic mass in liver. GI Surgeries / Procedures:  - 9/9 exploratory laparotomy with possible bowel obstruction resection/G-tube placement: Right sided pelvic mass, small bowel resection. Multiple omental and abdominal wall excisional biopsies. (+ for adenocarcinoma)   Central access: PICC placed for TPN 9/2  TPN start date: 9/2>>9/16, resume 9/18>>  Nutritional Goals: Goal TPN rate is 80 mL/hr (provides ~ 90 g of protein and 1820 kcals per day)  RD Assessment: Estimated Needs Total Energy Estimated Needs: 1700-1900 Total Protein Estimated Needs: 85-100g Total Fluid Estimated Needs: 1.9L/day  Current Nutrition:  Back to NPO 9/18: Restart TPN for persistent ileus   Plan: At 1800: Increase TPN to goal 63ml/hr   Electrolytes in TPN:  Na 110 mEq/L K 60 mEq/L Ca 5 mEq/L Mg 5 mEq/L Phos 7 mmol/L  Cl:Ac 1:! Add standard MVI and trace elements to TPN Chromium on hold d/t critical shortage Continue moderate scale SSI with q6 hr  Monitor TPN labs on Mon/Thurs and PRN  Kirby Argueta S. Merilynn Finland, PharmD, BCPS Clinical Staff Pharmacist [email or web address] 07/10/2023, 8:35 AM

## 2023-07-10 NOTE — Progress Notes (Signed)
Progress Note    Linda [name]   UEA:[id number]  DOB: May 26, 1966  DOA: 06/20/2023     20 PCP: Arnette Felts, [name]    Hospital Course: Linda [name] is a 57 yo female with PMH anemia, HTN, pre-DM who presented with abdominal pain.  She recently underwent EGD on 05/08/2023.  She was started on H. pylori treatment after procedure.  She completed treatment course approximately 2 weeks prior to admission. She has also been on oral iron and having dark stools. Due to worsening weakness and ongoing abdominal discomfort, she presented to the ER for further evaluation. Hemoglobin was found to be 5.9 g/dL.  She was admitted for blood transfusion and GI evaluation. She had recent colonoscopy and upper endoscopy performed June 2023.  She was not recommended for repeat scopes.  RUQ ultrasound showed hepatic steatosis and hypoechoic region in the right lobe liver.  Due to abdominal pain on admission, further workup commenced with CT abdomen/pelvis in case of gallbladder pathology. CT was notable for multiple findings.  There was a large mass measuring 4.7 x 3 cm noted in the right lower quadrant and also large heterogenous mass superior to the cervix measuring 7.9 x 6.1 cm. Multiple dilated loops of bowel concerning for compression/obstruction were also appreciated. She also has a recent history over the past year of dysfunctional uterine bleeding and thickened endometrium noted on outpatient ultrasound and again seen on CT.  She had declined outpatient endometrial biopsy.  Due to these findings, further workup commenced with consulting surgery, oncology, and gyn-oncology. Tumor markers ordered as well.  On 9/9, she ultimately underwent ex-lap with distal small bowel resection with primary anastomosis along with excisional biopsy of pelvic, multiple abdominal wall and omental masses.  Slow to improve  Interval History:   Had CT scan yesterday-- pain improved   Assessment and Plan: Colon  adenocarcinoma (HCC) - per CT there is 4.7 x 3 cm mass in RLL and mass superior to cervix 7.9 x 6.1 cm. Known thickened endometrium again seen and recent hx DUB that was being monitored outpatient.  - initial concern was for GYN malignancy; this has now been ruled out after ex-lap and pathology results peritoneal nodule needle core biopsy from 06/24/2023; endometrial biopsy also negative from 06/24/2023 -Patient underwent ex lap with distal small bowel resection with primary anastomosis, excisional biopsy of pelvic, multiple abdominal wall and omental masses on 06/30/2023 - general surgery following- CT scan on 9/18:-CT with some inflammatory changes near anastomosis, continue antibiotics  - oncology also now on board and following for further outpatient plans 3 staples removed from inferior portion of wound 9/16 for infection, additional 1 staple removed 9/18. Pack BID and PRN saturation. Shower with wound open.   SBO (small bowel obstruction) (HCC) - Presented with distended abdomen and decreased bowel sounds.  Initially suspected constipation contributing but after CT, concern is now for underlying obstruction which may be due to mass effect - general surgery following as well - now s/p ex-lap with small bowel resection with primary anastomosis and excisional biopsy of pelvic, multi abdominal wall, and omental masses on 06/30/2023 -NPO for now  Microcytic anemia - Multifactorial etiology at this point.  Overt iron deficiency on admission, newly diagnosed malignancy, and some expected blood loss from recent surgery on 06/30/2023 -Still needs DVT prophylaxis with HSQ given high risk of hypercoagulability/VTE; also needs Khorana score (currently at least 3 points, high risk) calculated prior to discharge and/or before treatment most likely   IDA (iron deficiency  anemia) - on admission: iron labs: Iron 9, sat ratio 2%, TIBC 444. Ferritin 8 - s/p 4 units PRBC so far since admission and received  ferrlicit  Upper GI bleed-resolved as of 07/03/2023 - Possibly slow/chronic blood loss from underlying "chronic inactive gastritis" noted on EGD from 05/08/2023.  No H. pylori organisms were noted on immunohistochemical stain but due to pattern of inflammation, she was started on H. pylori treatment which she completed approximately 2 weeks prior to admission -Stools were melanotic in appearance but she states she has also been on oral iron daily -Hemoglobin certainly low on admission, 5.9 g/dL.   - now also s/p ex-lap with presumed some blood loss as expected - will continue to trend H/H and transfuse as necessary - no further scopes per GI at this time  Type 2 diabetes mellitus with obesity (HCC) - Last A1c 6.5% on 12/05/2022 - SSI ordered in setting of TPN use  - once TPN fully off, will d/c SSI  Essential hypertension - hold ARB in setting of above - PRN meds to be added; can make scheduled if needed    DVT prophylaxis:  heparin injection 5,000 Units Start: 07/01/23 1000 SCDs Start: 06/20/23 2059   Code Status:   Code Status: Full Code  Mobility Assessment (Last 72 Hours)     Mobility Assessment     Row Name 07/10/23 0800 07/09/23 2045 07/09/23 0857 07/08/23 2049 07/08/23 0859   Does patient have an order for bedrest or is patient medically unstable No - Continue assessment No - Continue assessment No - Continue assessment No - Continue assessment No - Continue assessment   What is the highest level of mobility based on the progressive mobility assessment? Level 6 (Walks independently in room and hall) - Balance while walking in room without assist - Complete Level 6 (Walks independently in room and hall) - Balance while walking in room without assist - Complete Level 6 (Walks independently in room and hall) - Balance while walking in room without assist - Complete Level 6 (Walks independently in room and hall) - Balance while walking in room without assist - Complete Level 6  (Walks independently in room and hall) - Balance while walking in room without assist - Complete    Row Name 07/07/23 2100           Does patient have an order for bedrest or is patient medically unstable No - Continue assessment       What is the highest level of mobility based on the progressive mobility assessment? Level 6 (Walks independently in room and hall) - Balance while walking in room without assist - Complete                Barriers to discharge: None Disposition Plan: Home Status is: Inpatient  Objective: Blood pressure 112/61, pulse 73, temperature 98.4 F (36.9 C), temperature source Oral, resp. rate 16, height 5\' 4"  (1.626 m), weight 94.1 kg, SpO2 99%.  Examination:     General: Appearance:    Obese female in no acute distress     Lungs:     respirations unlabored  Heart:    Normal heart rate.    MS:   All extremities are intact.   Neurologic:   Awake, alert    Consultants:  GI - signed off General surgery Gyn-onc - signed off Oncology IR  Procedures:  06/30/23: Exploratory laparotomy, distal small bowel resection with primary anastomosis, excisional biopsy of pelvic, multiple abdominal wall and omental  masses   Data Reviewed: Results for orders placed or performed during the hospital encounter of 06/20/23 (from the past 24 hour(s))  Glucose, capillary     Status: None   Collection Time: 07/09/23  1:55 PM  Result Value Ref Range   Glucose-Capillary 85 70 - 99 mg/dL  Glucose, capillary     Status: Abnormal   Collection Time: 07/09/23  4:44 PM  Result Value Ref Range   Glucose-Capillary 62 (L) 70 - 99 mg/dL  Glucose, capillary     Status: Abnormal   Collection Time: 07/09/23  5:26 PM  Result Value Ref Range   Glucose-Capillary 127 (H) 70 - 99 mg/dL  Glucose, capillary     Status: Abnormal   Collection Time: 07/09/23  6:17 PM  Result Value Ref Range   Glucose-Capillary 103 (H) 70 - 99 mg/dL  Glucose, capillary     Status: None   Collection  Time: 07/09/23  8:39 PM  Result Value Ref Range   Glucose-Capillary 86 70 - 99 mg/dL  Glucose, capillary     Status: None   Collection Time: 07/09/23 11:31 PM  Result Value Ref Range   Glucose-Capillary 99 70 - 99 mg/dL  Glucose, capillary     Status: Abnormal   Collection Time: 07/10/23  3:48 AM  Result Value Ref Range   Glucose-Capillary 101 (H) 70 - 99 mg/dL  CBC with Differential/Platelet     Status: Abnormal   Collection Time: 07/10/23  4:21 AM  Result Value Ref Range   WBC 18.0 (H) 4.0 - 10.5 K/uL   RBC 3.08 (L) 3.87 - 5.11 MIL/uL   Hemoglobin 7.7 (L) 12.0 - 15.0 g/dL   HCT 19.5 (L) 09.3 - 26.7 %   MCV 85.7 80.0 - 100.0 fL   MCH 25.0 (L) 26.0 - 34.0 pg   MCHC 29.2 (L) 30.0 - 36.0 g/dL   RDW 12.4 (H) 58.0 - 99.8 %   Platelets 411 (H) 150 - 400 K/uL   nRBC 0.0 0.0 - 0.2 %   Neutrophils Relative % 86 %   Neutro Abs 15.5 (H) 1.7 - 7.7 K/uL   Lymphocytes Relative 6 %   Lymphs Abs 1.1 0.7 - 4.0 K/uL   Monocytes Relative 5 %   Monocytes Absolute 0.9 0.1 - 1.0 K/uL   Eosinophils Relative 1 %   Eosinophils Absolute 0.2 0.0 - 0.5 K/uL   Basophils Relative 0 %   Basophils Absolute 0.0 0.0 - 0.1 K/uL   Immature Granulocytes 2 %   Abs Immature Granulocytes 0.33 (H) 0.00 - 0.07 K/uL   Reactive, Benign Lymphocytes PRESENT    Dimorphism PRESENT    Polychromasia PRESENT   Comprehensive metabolic panel     Status: Abnormal   Collection Time: 07/10/23  4:21 AM  Result Value Ref Range   Sodium 133 (L) 135 - 145 mmol/L   Potassium 3.7 3.5 - 5.1 mmol/L   Chloride 101 98 - 111 mmol/L   CO2 25 22 - 32 mmol/L   Glucose, Bld 115 (H) 70 - 99 mg/dL   BUN 7 6 - 20 mg/dL   Creatinine, Ser 3.38 0.44 - 1.00 mg/dL   Calcium 7.8 (L) 8.9 - 10.3 mg/dL   Total Protein 6.5 6.5 - 8.1 g/dL   Albumin 1.9 (L) 3.5 - 5.0 g/dL   AST 41 15 - 41 U/L   ALT 29 0 - 44 U/L   Alkaline Phosphatase 125 38 - 126 U/L   Total Bilirubin 0.4 0.3 -  1.2 mg/dL   GFR, Estimated >86 >57 mL/min   Anion gap 7 5 - 15   Magnesium     Status: None   Collection Time: 07/10/23  4:21 AM  Result Value Ref Range   Magnesium 2.2 1.7 - 2.4 mg/dL  Phosphorus     Status: None   Collection Time: 07/10/23  4:21 AM  Result Value Ref Range   Phosphorus 3.6 2.5 - 4.6 mg/dL  Glucose, capillary     Status: Abnormal   Collection Time: 07/10/23  7:30 AM  Result Value Ref Range   Glucose-Capillary 112 (H) 70 - 99 mg/dL    I have reviewed pertinent nursing notes, vitals, labs, and images as necessary. I have ordered labwork to follow up on as indicated.  I have reviewed the last notes from staff over past 24 hours. I have discussed patient's care plan and test results with nursing staff, CM/SW, and other staff as appropriate.    LOS: 20 days   Joseph Art, DO [facility] 07/10/2023, 11:46 AM

## 2023-07-10 NOTE — Progress Notes (Signed)
Referring Physician(s): Kinsinger,L  Supervising Physician: Marliss Coots  Patient Status:  [facility] - In-pt  Chief Complaint: Abdominal fluid collection   Subjective: Patient familiar to IR service from anterior abdominal peritoneal nodule biopsy on 06/24/2023 and Port-A-Cath placement on 07/08/2023; she is a 57 year old female with PMH significant for anemia, hypertension,  pre-diabetes and newly diagnosed colon adenocarcinoma. Patient has been admitted to [facility] since 06/20/23. During her stay, she underwent ex-lap with distal small bowel resection and excisional biopsy of pelvic, abdominal wall, and omental masses c/w with colon adenocarcinoma.  Due to nausea and worsening leukocytosis along with poor p.o. intake patient underwent follow-up CT A/P today which revealed;  Status post small bowel resection with suture line in the right lateral abdomen.   Adjacent long segment wall thickening involving the distal small bowel and cecal wall thickening, new, raising concern for at risk bowel. No pneumatosis or free air. Consider surgical evaluation as clinically warranted.   5.5 cm right lower quadrant fluid collection, possibly reflecting a postsurgical abscess, noting that the appendix is not visualized.   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  She is afebrile, creatinine normal, WBC 18, hemoglobin 7.7, platelets 411K, PT/INR pend; she denies fever, headache, chest pain, worsening dyspnea, cough, worsening abdominal pain, back pain, vomiting or bleeding ;request now received from surgical team for image guided drainage of right lower abdominal fluid collection;    Past Medical History:  Diagnosis Date   Anemia    Hypertension    Pre-diabetes 02/2022   Past Surgical History:  Procedure Laterality Date   BREAST LUMPECTOMY WITH RADIOACTIVE SEED  LOCALIZATION Left 06/10/2022   Procedure: LEFT BREAST LUMPECTOMY WITH RADIOACTIVE SEED LOCALIZATION;  Surgeon: Griselda Miner, MD;  Location: [facility];  Service: General;  Laterality: Left;   CESAREAN SECTION     x4   IR IMAGING GUIDED PORT INSERTION  07/08/2023   LAPAROTOMY N/A 06/30/2023   Procedure: EXPLORATORY LAPAROTOMY, SMALL BOWEL RESECTION, AND EXCISIONAL BIOPSIES OF MULTIPLE OMENTAL MASSES;  Surgeon: Berna Bue, MD;  Location: [facility];  Service: General;  Laterality: N/A;   TUBAL LIGATION       Allergies: Patient has no known allergies.  Medications: Prior to Admission medications   Medication Sig Start Date End Date Taking? Authorizing Provider  Ferric Maltol (ACCRUFER) 30 MG CAPS Take 1 capsule (30 mg total) by mouth daily. 03/05/23  Yes Arnette Felts, [name]  linaclotide [facility] 290 MCG CAPS capsule Take 1 capsule (290 mcg total) by mouth 20 minutes before breakfast 05/09/23  Yes   Semaglutide (RYBELSUS) 7 MG TABS Take 1 tablet (7 mg total) by mouth daily. 01/13/23  Yes Arnette Felts, [name]  valsartan (DIOVAN) 160 MG tablet Take 1 tablet (160 mg total) by mouth daily. 10/03/22 10/03/23 Yes Arnette Felts, [name]  amoxicillin (AMOXIL) 500 MG capsule Take 2 capsules (1,000 mg total) by mouth 2 (two) times daily for 14 days Patient not taking: Reported on 06/20/2023 05/19/23     clarithromycin (BIAXIN) 500 MG tablet Take 1 tablet (500 mg total) by mouth every 12 (twelve) hours for 14 days Patient not taking: Reported on 06/20/2023 05/19/23     ferrous sulfate 325 (65 FE) MG tablet Take 1 tablet (325 mg total) by mouth daily. Patient not taking: Reported on 03/05/2023 02/22/22   Dione Booze, MD  fluticasone [facility]) 50 MCG/ACT nasal  spray Place 2 sprays into both nostrils daily. Patient not taking: Reported on 03/05/2023 07/17/22   Henderly, Britni A, PA-C  lansoprazole (PREVACID) 30 MG capsule Take 1 capsule (30 mg total) by mouth 2 (two) times daily for 14 days Patient not  taking: Reported on 06/20/2023 05/19/23     Vitamin D, Ergocalciferol, (DRISDOL) 1.25 MG (50000 UNIT) CAPS capsule Take 1 capsule (50,000 Units total) by mouth 2 (two) times a week. Patient not taking: Reported on 06/20/2023 01/23/23   Arnette Felts, [name]     Vital Signs: BP (!) 106/58 (BP Location: Left Arm)   Pulse 71   Temp 98.4 F (36.9 C) (Oral)   Resp 18   Ht 5\' 4"  (1.626 m)   Wt 207 lb 8 oz (94.1 kg)   SpO2 97%   BMI 35.62 kg/m   Physical Exam; awake, alert.  Chest clear to auscultation bilaterally.  Clean ,intact right chest wall Port-A-Cath.  Heart with regular rate and rhythm.  Abdomen soft, positive bowel sounds, abdominal incision open inferiorly with thick brown drainage, staples in place along midline vertical wound ; no lower extremity edema  Imaging: CT ABDOMEN PELVIS W CONTRAST  Result Date: 07/09/2023 CLINICAL DATA:  Abdominal pain, status post exploratory laparotomy with small bowel resection and omental biopsy EXAM: CT ABDOMEN AND PELVIS WITH CONTRAST TECHNIQUE: Multidetector CT imaging of the abdomen and pelvis was performed using the standard protocol following bolus administration of intravenous contrast. RADIATION DOSE REDUCTION: This exam was performed according to the departmental dose-optimization program which includes automated exposure control, adjustment of the mA and/or kV according to patient size and/or use of iterative reconstruction technique. CONTRAST:  OMNIPAQUE IOHEXOL 300 MG/ML  SOLN COMPARISON:  MRI abdomen dated 07/03/2023. CT abdomen/pelvis dated 06/22/2023. FINDINGS: Lower chest: Mild bibasilar atelectasis. Hepatobiliary: Subcentimeter lesion in the posterior right hepatic lobe (series 10/image 22), suspicious for metastasis on MR. Layering tiny gallstones (series 2/image 34), without associated linear changes. No intrahepatic or extrahepatic duct dilatation. Pancreas: Within normal limits. Spleen: Within normal limits Adrenals/Urinary Tract: Adrenal  glands within normal limits. Kidneys are within normal limits.  No hydronephrosis. Bladder is within normal limits with trace nondependent gas. Stomach/Bowel: Stomach is within normal limits. No evidence of bowel obstruction, improved. Status post small bowel resection with suture line in the right lateral abdomen (series 2/image 60). Adjacent long segment wall thickening involving small bowel in the right lower abdomen (series 2/image 85), new, raising concern for at risk bowel. Adjacent wall thickening/inflammatory changes involving the cecum (series 2, image 54), new. No pneumatosis or free air. Adjacent fluid collection in the right lower quadrant measuring 3.5 x 3.1 x 5.5 cm (series 2/image 64), possibly reflecting a postsurgical abscess, noting that the appendix is not visualized. Mild sigmoid diverticulosis, without diverticulitis. Vascular/Lymphatic: No evidence of abdominal aortic aneurysm. Small retroperitoneal lymph nodes which do not meet pathologic CT size criteria. Reproductive: Uterus is grossly unremarkable The ovaries are poorly visualized. Other: 5.4 x 7.8 cm complex cystic peritoneal implant in the dependent pelvis (series 2/image 94). Scattered peritoneal nodularity measuring up to 18 mm medial left in the anterior abdominal wall (series 2/image 54). Some of the recent peritoneal implants on CT are no longer evident, likely surgically excised. Musculoskeletal: Visualized osseous structures are within normal limits. IMPRESSION: Status post small bowel resection with suture line in the right lateral abdomen. Adjacent long segment wall thickening involving the distal small bowel and cecal wall thickening, new, raising concern for at risk bowel. No  pneumatosis or free air. Consider surgical evaluation as clinically warranted. 5.5 cm right lower quadrant fluid collection, possibly reflecting a postsurgical abscess, noting that the appendix is not visualized. Multifocal peritoneal metastases, some of  which are unchanged, some of which have likely been surgically excised. 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 Additional ancillary findings as above. Electronically Signed   By: Charline Bills M.D.   On: 07/09/2023 18:40   IR IMAGING GUIDED PORT INSERTION  Result Date: 07/08/2023 INDICATION: 57 year old female with history of colon cancer presenting for Port-A-Cath placement. EXAM: IMPLANTED PORT A CATH PLACEMENT WITH ULTRASOUND AND FLUOROSCOPIC GUIDANCE COMPARISON:  None Available. MEDICATIONS: None. ANESTHESIA/SEDATION: Moderate (conscious) sedation was employed during this procedure. A total of Versed 2 mg and Fentanyl 100 mcg was administered intravenously. Moderate Sedation Time: 15 minutes. The patient's level of consciousness and vital signs were monitored continuously by radiology nursing throughout the procedure under my direct supervision. CONTRAST:  None FLUOROSCOPY TIME:  Five mGy COMPLICATIONS: None immediate. PROCEDURE: The procedure, risks, benefits, and alternatives were explained to the patient. Questions regarding the procedure were encouraged and answered. The patient understands and consents to the procedure. The right neck and chest were prepped with chlorhexidine in a sterile fashion, and a sterile drape was applied covering the operative field. Maximum barrier sterile technique with sterile gowns and gloves were used for the procedure. A timeout was performed prior to the initiation of the procedure. Ultrasound was used to examine the jugular vein which was compressible and free of internal echoes. A skin marker was used to demarcate the planned venotomy and port pocket incision sites. Local anesthesia was provided to these sites and the subcutaneous tunnel track with 1% lidocaine with 1:100,000 epinephrine. A small incision was created at the jugular access site and blunt dissection was performed of the subcutaneous tissues. Under ultrasound guidance, the  jugular vein was accessed with a 21 ga micropuncture needle and an 0.018" wire was inserted to the superior vena cava. Real-time ultrasound guidance was utilized for vascular access including the acquisition of a permanent ultrasound image documenting patency of the accessed vessel. A 5 Fr micopuncture set was then used, through which a 0.035" Rosen wire was passed under fluoroscopic guidance into the inferior vena cava. An 8 Fr dilator was then placed over the wire. A subcutaneous port pocket was then created along the upper chest wall utilizing a combination of sharp and blunt dissection. The pocket was irrigated with sterile saline, packed with gauze, and observed for hemorrhage. A single lumen plastic power injectable port was chosen for placement. The 8 Fr catheter was tunneled from the port pocket site to the venotomy incision. The port was placed in the pocket. The external catheter was trimmed to appropriate length. The dilator was exchanged for an 8 Fr peel-away sheath under fluoroscopic guidance. The catheter was then placed through the sheath and the sheath was removed. Final catheter positioning was confirmed and documented with a fluoroscopic spot radiograph. The port was accessed with a Huber needle, aspirated, and flushed with heparinized saline. The deep dermal layer of the port pocket incision was closed with interrupted 3-0 Vicryl suture. The skin was opposed with a running subcuticular 4-0 Monocryl suture. Dermabond was then placed over the port pocket and neck incisions. The patient tolerated the procedure well without immediate post procedural complication. FINDINGS: After catheter placement, the tip lies within the superior cavoatrial junction. The catheter aspirates and flushes normally and is ready for immediate use. IMPRESSION:  Successful placement of a power injectable Port-A-Cath via the right internal jugular vein. The catheter is ready for immediate use. Marliss Coots, MD [facility] Electronically Signed   By: Marliss Coots M.D.   On: 07/08/2023 15:15    Labs:  CBC: Recent Labs    07/07/23 0454 07/08/23 0321 07/09/23 0328 07/10/23 0421  WBC 16.2* 10.9* 18.6* 18.0*  HGB 8.1* 7.6* 8.9* 7.7*  HCT 27.8* 26.1* 30.2* 26.4*  PLT 458* 441* 502* 411*    COAGS: Recent Labs    06/20/23 1436 06/24/23 0942  INR 1.1 1.2    BMP: Recent Labs    07/05/23 0250 07/06/23 0416 07/07/23 0454 07/10/23 0421  NA 136 136 134* 133*  K 3.7 3.7 3.8 3.7  CL 101 101 97* 101  CO2 25 27 27 25   GLUCOSE 131* 150* 110* 115*  BUN 11 10 9 7   CALCIUM 8.2* 8.5* 8.6* 7.8*  CREATININE 0.57 0.55 0.77 0.75  GFRNONAA >60 >60 >60 >60    LIVER FUNCTION TESTS: Recent Labs    07/01/23 0515 07/03/23 0354 07/07/23 0454 07/10/23 0421  BILITOT 1.8* 0.9 0.6 0.4  AST 15 13* 20 41  ALT 11 12 15 29   ALKPHOS 67 79 137* 125  PROT 6.4* 6.4* 7.3 6.5  ALBUMIN 2.7* 2.4* 2.4* 1.9*    Assessment and Plan: 57 year old female with PMH significant for anemia, hypertension,  pre-diabetes and newly diagnosed colon adenocarcinoma. Patient has been admitted to [facility] since 06/20/23. During her stay, she underwent ex-lap with distal small bowel resection and excisional biopsy of pelvic, abdominal wall, and omental masses c/w with colon adenocarcinoma.  Due to nausea and worsening leukocytosis along with poor p.o. intake patient underwent follow-up CT A/P today which revealed;  Status post small bowel resection with suture line in the right lateral abdomen.   Adjacent long segment wall thickening involving the distal small bowel and cecal wall thickening, new, raising concern for at risk bowel. No pneumatosis or free air. Consider surgical evaluation as clinically warranted.   5.5 cm right lower quadrant fluid collection, possibly reflecting a postsurgical abscess, noting that the appendix is not visualized.   Multifocal peritoneal metastases,  some of which are unchanged, some of which have likely been surgically excised.   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  She is afebrile, creatinine normal, WBC 18, hemoglobin 7.7, platelets 411K, PT/INR pend; she denies fever, headache, chest pain, worsening dyspnea, cough, worsening abdominal pain, back pain, vomiting or bleeding ;request now received from surgical team for image guided drainage of right lower abdominal fluid collection; imaging studies were reviewed by Dr. Elby Showers.Risks and benefits discussed with the patient including bleeding, infection, damage to adjacent structures, bowel perforation/fistula connection, and sepsis.  All of the patient's questions were answered, patient is agreeable to proceed. Consent signed and in chart.  Procedure scheduled for 9/20   Electronically Signed: D. Jeananne Rama, PA-C 07/10/2023, 3:35 PM   I spent a total of 25 minutes at the the patient's bedside AND on the patient's hospital floor or unit, greater than 50% of which was counseling/coordinating care for CT-guided drainage of right lower abdominal fluid collection    Patient ID: Linda [name], female   DOB: 1966/08/22, 57 y.o.   MRN: [id number]

## 2023-07-10 NOTE — Progress Notes (Signed)
10 Days Post-Op  Subjective: CT scan yesterday, nausea and pain issues today  ROS: See above, otherwise other systems negative  Objective: Vital signs in last 24 hours: Temp:  [98.4 F (36.9 C)-99 F (37.2 C)] 98.4 F (36.9 C) (09/19 0447) Pulse Rate:  [73-85] 73 (09/19 0447) Resp:  [15-20] 16 (09/19 0447) BP: (104-113)/(57-61) 112/61 (09/19 0447) SpO2:  [95 %-99 %] 99 % (09/19 0447) Weight:  [94.1 kg] 94.1 kg (09/19 0514) Last BM Date : 07/07/23  Intake/Output from previous day: 09/18 0701 - 09/19 0700 In: 554.7 [I.V.:454.7; IV Piggyback:100] Out: -  Intake/Output this shift: No intake/output data recorded.  PE: Gen: uncomfortable Resp: nonlabored CV: RRR Abd: soft, incision open inferiorly with thick brown drainage  Lab Results:  Recent Labs    07/09/23 0328 07/10/23 0421  WBC 18.6* 18.0*  HGB 8.9* 7.7*  HCT 30.2* 26.4*  PLT 502* 411*   BMET Recent Labs    07/10/23 0421  NA 133*  K 3.7  CL 101  CO2 25  GLUCOSE 115*  BUN 7  CREATININE 0.75  CALCIUM 7.8*   PT/INR No results for input(s): "LABPROT", "INR" in the last 72 hours. CMP     Component Value Date/Time   NA 133 (L) 07/10/2023 0421   NA 141 05/16/2022 1044   K 3.7 07/10/2023 0421   CL 101 07/10/2023 0421   CO2 25 07/10/2023 0421   GLUCOSE 115 (H) 07/10/2023 0421   BUN 7 07/10/2023 0421   BUN 9 05/16/2022 1044   CREATININE 0.75 07/10/2023 0421   CALCIUM 7.8 (L) 07/10/2023 0421   PROT 6.5 07/10/2023 0421   PROT 7.8 05/16/2022 1044   ALBUMIN 1.9 (L) 07/10/2023 0421   ALBUMIN 4.4 05/16/2022 1044   AST 41 07/10/2023 0421   ALT 29 07/10/2023 0421   ALKPHOS 125 07/10/2023 0421   BILITOT 0.4 07/10/2023 0421   BILITOT <0.2 05/16/2022 1044   GFRNONAA >60 07/10/2023 0421   Lipase     Component Value Date/Time   LIPASE 35 06/20/2023 1436    Studies/Results: CT ABDOMEN PELVIS W CONTRAST  Result Date: 07/09/2023 CLINICAL DATA:  Abdominal pain, status post exploratory laparotomy  with small bowel resection and omental biopsy EXAM: CT ABDOMEN AND PELVIS WITH CONTRAST TECHNIQUE: Multidetector CT imaging of the abdomen and pelvis was performed using the standard protocol following bolus administration of intravenous contrast. RADIATION DOSE REDUCTION: This exam was performed according to the departmental dose-optimization program which includes automated exposure control, adjustment of the mA and/or kV according to patient size and/or use of iterative reconstruction technique. CONTRAST:  OMNIPAQUE IOHEXOL 300 MG/ML  SOLN COMPARISON:  MRI abdomen dated 07/03/2023. CT abdomen/pelvis dated 06/22/2023. FINDINGS: Lower chest: Mild bibasilar atelectasis. Hepatobiliary: Subcentimeter lesion in the posterior right hepatic lobe (series 10/image 22), suspicious for metastasis on MR. Layering tiny gallstones (series 2/image 34), without associated linear changes. No intrahepatic or extrahepatic duct dilatation. Pancreas: Within normal limits. Spleen: Within normal limits Adrenals/Urinary Tract: Adrenal glands within normal limits. Kidneys are within normal limits.  No hydronephrosis. Bladder is within normal limits with trace nondependent gas. Stomach/Bowel: Stomach is within normal limits. No evidence of bowel obstruction, improved. Status post small bowel resection with suture line in the right lateral abdomen (series 2/image 60). Adjacent long segment wall thickening involving small bowel in the right lower abdomen (series 2/image 85), new, raising concern for at risk bowel. Adjacent wall thickening/inflammatory changes involving the cecum (series 2, image 54), new. No pneumatosis  or free air. Adjacent fluid collection in the right lower quadrant measuring 3.5 x 3.1 x 5.5 cm (series 2/image 64), possibly reflecting a postsurgical abscess, noting that the appendix is not visualized. Mild sigmoid diverticulosis, without diverticulitis. Vascular/Lymphatic: No evidence of abdominal aortic aneurysm.  Small retroperitoneal lymph nodes which do not meet pathologic CT size criteria. Reproductive: Uterus is grossly unremarkable The ovaries are poorly visualized. Other: 5.4 x 7.8 cm complex cystic peritoneal implant in the dependent pelvis (series 2/image 94). Scattered peritoneal nodularity measuring up to 18 mm medial left in the anterior abdominal wall (series 2/image 54). Some of the recent peritoneal implants on CT are no longer evident, likely surgically excised. Musculoskeletal: Visualized osseous structures are within normal limits. IMPRESSION: Status post small bowel resection with suture line in the right lateral abdomen. Adjacent long segment wall thickening involving the distal small bowel and cecal wall thickening, new, raising concern for at risk bowel. No pneumatosis or free air. Consider surgical evaluation as clinically warranted. 5.5 cm right lower quadrant fluid collection, possibly reflecting a postsurgical abscess, noting that the appendix is not visualized. 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 Additional ancillary findings as above. Electronically Signed   By: Charline Bills M.D.   On: 07/09/2023 18:40   IR IMAGING GUIDED PORT INSERTION  Result Date: 07/08/2023 INDICATION: 57 year old female with history of colon cancer presenting for Port-A-Cath placement. EXAM: IMPLANTED PORT A CATH PLACEMENT WITH ULTRASOUND AND FLUOROSCOPIC GUIDANCE COMPARISON:  None Available. MEDICATIONS: None. ANESTHESIA/SEDATION: Moderate (conscious) sedation was employed during this procedure. A total of Versed 2 mg and Fentanyl 100 mcg was administered intravenously. Moderate Sedation Time: 15 minutes. The patient's level of consciousness and vital signs were monitored continuously by radiology nursing throughout the procedure under my direct supervision. CONTRAST:  None  FLUOROSCOPY TIME:  Five mGy COMPLICATIONS: None immediate. PROCEDURE: The procedure, risks, benefits, and alternatives were explained to the patient. Questions regarding the procedure were encouraged and answered. The patient understands and consents to the procedure. The right neck and chest were prepped with chlorhexidine in a sterile fashion, and a sterile drape was applied covering the operative field. Maximum barrier sterile technique with sterile gowns and gloves were used for the procedure. A timeout was performed prior to the initiation of the procedure. Ultrasound was used to examine the jugular vein which was compressible and free of internal echoes. A skin marker was used to demarcate the planned venotomy and port pocket incision sites. Local anesthesia was provided to these sites and the subcutaneous tunnel track with 1% lidocaine with 1:100,000 epinephrine. A small incision was created at the jugular access site and blunt dissection was performed of the subcutaneous tissues. Under ultrasound guidance, the jugular vein was accessed with a 21 ga micropuncture needle and an 0.018" wire was inserted to the superior vena cava. Real-time ultrasound guidance was utilized for vascular access including the acquisition of a permanent ultrasound image documenting patency of the accessed vessel. A 5 Fr micopuncture set was then used, through which a 0.035" Rosen wire was passed under fluoroscopic guidance into the inferior vena cava. An 8 Fr dilator was then placed over the wire. A subcutaneous port pocket was then created along the upper chest wall utilizing a combination of sharp and blunt dissection. The pocket was irrigated with sterile saline, packed with gauze, and observed for hemorrhage. A single lumen plastic power injectable port  was chosen for placement. The 8 Fr catheter was tunneled from the port pocket site to the venotomy incision. The port was placed in the pocket. The external catheter was trimmed  to appropriate length. The dilator was exchanged for an 8 Fr peel-away sheath under fluoroscopic guidance. The catheter was then placed through the sheath and the sheath was removed. Final catheter positioning was confirmed and documented with a fluoroscopic spot radiograph. The port was accessed with a Huber needle, aspirated, and flushed with heparinized saline. The deep dermal layer of the port pocket incision was closed with interrupted 3-0 Vicryl suture. The skin was opposed with a running subcuticular 4-0 Monocryl suture. Dermabond was then placed over the port pocket and neck incisions. The patient tolerated the procedure well without immediate post procedural complication. FINDINGS: After catheter placement, the tip lies within the superior cavoatrial junction. The catheter aspirates and flushes normally and is ready for immediate use. IMPRESSION: Successful placement of a power injectable Port-A-Cath via the right internal jugular vein. The catheter is ready for immediate use. Marliss Coots, MD [facility] Radiology Electronically Signed   By: Marliss Coots M.D.   On: 07/08/2023 15:15    Anti-infectives: Anti-infectives (From admission, onward)    Start     Dose/Rate Route Frequency Ordered Stop   07/09/23 1600  piperacillin-tazobactam (ZOSYN) IVPB 3.375 g        3.375 g 12.5 mL/hr over 240 Minutes Intravenous Every 8 hours 07/09/23 1510     06/30/23 0830  ceFAZolin (ANCEF) IVPB 2g/100 mL premix        2 g 200 mL/hr over 30 Minutes Intravenous On call to O.R. 06/30/23 0740 06/30/23 1227       Assessment/Plan POD#10 s/p ex lap, SBR and biopsy of multiple sites 9/9 Dr. Fredricka Bonine - Seems to still have some ileus. No emesis and is having some bowel function, but feels full quickly with nausea and poor PO intake. Worsening leukocytosis today. Will obtain CT scan. Make NPO for now. Restart TPN. - 3 staples removed from inferior portion of wound 9/16  for infection, additional 1 staple removed 9/18. Pack BID and PRN saturation. Shower with wound open. -CT with some inflammatory changes near anastomosis, continue antibiotics  I reviewed last 24 h vitals and pain scores, last 48 h intake and output, last 24 h labs and trends, and last 24 h imaging results.  This care required moderate level of medical decision making.    LOS: 20 days   [facility] Surgery 07/10/2023, 10:03 AM Please see Amion for pager number during day hours 7:00am-4:30pm or 7:00am -11:30am on weekends

## 2023-07-10 NOTE — Plan of Care (Signed)
Pt is progressing 

## 2023-07-11 ENCOUNTER — Inpatient Hospital Stay (HOSPITAL_COMMUNITY): Payer: BC Managed Care – PPO

## 2023-07-11 DIAGNOSIS — C179 Malignant neoplasm of small intestine, unspecified: Secondary | ICD-10-CM | POA: Diagnosis not present

## 2023-07-11 LAB — CBC WITH DIFFERENTIAL/PLATELET
Abs Immature Granulocytes: 0.18 10*3/uL — ABNORMAL HIGH (ref 0.00–0.07)
Basophils Absolute: 0 10*3/uL (ref 0.0–0.1)
Basophils Relative: 0 %
Eosinophils Absolute: 0.2 10*3/uL (ref 0.0–0.5)
Eosinophils Relative: 2 %
HCT: 25.9 % — ABNORMAL LOW (ref 36.0–46.0)
Hemoglobin: 7.3 g/dL — ABNORMAL LOW (ref 12.0–15.0)
Immature Granulocytes: 2 %
Lymphocytes Relative: 10 %
Lymphs Abs: 1.3 10*3/uL (ref 0.7–4.0)
MCH: 24.3 pg — ABNORMAL LOW (ref 26.0–34.0)
MCHC: 28.2 g/dL — ABNORMAL LOW (ref 30.0–36.0)
MCV: 86.3 fL (ref 80.0–100.0)
Monocytes Absolute: 0.7 10*3/uL (ref 0.1–1.0)
Monocytes Relative: 5 %
Neutro Abs: 9.9 10*3/uL — ABNORMAL HIGH (ref 1.7–7.7)
Neutrophils Relative %: 81 %
Platelets: 383 10*3/uL (ref 150–400)
RBC: 3 MIL/uL — ABNORMAL LOW (ref 3.87–5.11)
RDW: 28.3 % — ABNORMAL HIGH (ref 11.5–15.5)
WBC: 12.3 10*3/uL — ABNORMAL HIGH (ref 4.0–10.5)
nRBC: 0 % (ref 0.0–0.2)

## 2023-07-11 LAB — BASIC METABOLIC PANEL
Anion gap: 9 (ref 5–15)
BUN: 7 mg/dL (ref 6–20)
CO2: 21 mmol/L — ABNORMAL LOW (ref 22–32)
Calcium: 7.5 mg/dL — ABNORMAL LOW (ref 8.9–10.3)
Chloride: 103 mmol/L (ref 98–111)
Creatinine, Ser: 0.65 mg/dL (ref 0.44–1.00)
GFR, Estimated: >60 mL/min (ref >60)
Glucose, Bld: 124 mg/dL — ABNORMAL HIGH (ref 70–99)
Potassium: 3.7 mmol/L (ref 3.5–5.1)
Sodium: 133 mmol/L — ABNORMAL LOW (ref 135–145)

## 2023-07-11 LAB — PROTIME-INR
INR: 1.2 (ref 0.8–1.2)
Prothrombin Time: 15.2 seconds (ref 11.4–15.2)

## 2023-07-11 LAB — GLUCOSE, CAPILLARY
Glucose-Capillary: 115 mg/dL — ABNORMAL HIGH (ref 70–99)
Glucose-Capillary: 117 mg/dL — ABNORMAL HIGH (ref 70–99)
Glucose-Capillary: 117 mg/dL — ABNORMAL HIGH (ref 70–99)
Glucose-Capillary: 127 mg/dL — ABNORMAL HIGH (ref 70–99)
Glucose-Capillary: 130 mg/dL — ABNORMAL HIGH (ref 70–99)
Glucose-Capillary: 135 mg/dL — ABNORMAL HIGH (ref 70–99)
Glucose-Capillary: 140 mg/dL — ABNORMAL HIGH (ref 70–99)
Glucose-Capillary: 142 mg/dL — ABNORMAL HIGH (ref 70–99)

## 2023-07-11 MED ORDER — POTASSIUM CHLORIDE 10 MEQ/100ML IV SOLN
10.0000 meq | INTRAVENOUS | Status: AC
  Administered 2023-07-11 (×2): 10 meq via INTRAVENOUS
  Filled 2023-07-11 (×2): qty 100

## 2023-07-11 MED ORDER — TRAVASOL 10 % IV SOLN
INTRAVENOUS | Status: AC
  Filled 2023-07-11: qty 902.4

## 2023-07-11 MED ORDER — FENTANYL CITRATE (PF) 100 MCG/2ML IJ SOLN
INTRAMUSCULAR | Status: AC
  Filled 2023-07-11: qty 2

## 2023-07-11 MED ORDER — MIDAZOLAM HCL 2 MG/2ML IJ SOLN
INTRAMUSCULAR | Status: AC
  Filled 2023-07-11: qty 2

## 2023-07-11 MED ORDER — FENTANYL CITRATE (PF) 100 MCG/2ML IJ SOLN
INTRAMUSCULAR | Status: AC | PRN
Start: 2023-07-11 — End: 2023-07-11
  Administered 2023-07-11 (×3): 25 ug via INTRAVENOUS

## 2023-07-11 MED ORDER — INSULIN ASPART 100 UNIT/ML IJ SOLN
0.0000 [IU] | Freq: Four times a day (QID) | INTRAMUSCULAR | Status: DC
  Administered 2023-07-12: 2 [IU] via SUBCUTANEOUS
  Administered 2023-07-12 (×3): 1 [IU] via SUBCUTANEOUS
  Administered 2023-07-13: 2 [IU] via SUBCUTANEOUS
  Administered 2023-07-13 – 2023-07-15 (×4): 1 [IU] via SUBCUTANEOUS

## 2023-07-11 MED ORDER — SODIUM CHLORIDE 0.9% FLUSH
5.0000 mL | Freq: Three times a day (TID) | INTRAVENOUS | Status: DC
  Administered 2023-07-11 – 2023-07-16 (×13): 5 mL

## 2023-07-11 MED ORDER — MIDAZOLAM HCL 2 MG/2ML IJ SOLN
INTRAMUSCULAR | Status: AC | PRN
Start: 2023-07-11 — End: 2023-07-11
  Administered 2023-07-11 (×3): .5 mg via INTRAVENOUS

## 2023-07-11 NOTE — Progress Notes (Signed)
11 Days Post-Op   Subjective/Chief Complaint: Feels a little better. Passing flatus   Objective: Vital signs in last 24 hours: Temp:  [98 F (36.7 C)-98.4 F (36.9 C)] 98 F (36.7 C) (09/20 0435) Pulse Rate:  [64-71] 64 (09/20 0435) Resp:  [18] 18 (09/20 0435) BP: (99-112)/(52-58) 112/58 (09/20 0435) SpO2:  [97 %-98 %] 97 % (09/20 0435) Weight:  [91.9 kg] 91.9 kg (09/20 0500) Last BM Date : 07/07/23  Intake/Output from previous day: 09/19 0701 - 09/20 0700 In: 580 [I.V.:496.2; IV Piggyback:83.8] Out: -  Intake/Output this shift: No intake/output data recorded.  General appearance: alert and cooperative Resp: clear to auscultation bilaterally Cardio: regular rate and rhythm GI: soft, mild tenderness on right  Lab Results:  Recent Labs    07/10/23 0421 07/11/23 0417  WBC 18.0* 12.3*  HGB 7.7* 7.3*  HCT 26.4* 25.9*  PLT 411* 383   BMET Recent Labs    07/10/23 0421 07/11/23 0417  NA 133* 133*  K 3.7 3.7  CL 101 103  CO2 25 21*  GLUCOSE 115* 124*  BUN 7 7  CREATININE 0.75 0.65  CALCIUM 7.8* 7.5*   PT/INR Recent Labs    07/11/23 0417  LABPROT 15.2  INR 1.2   ABG No results for input(s): "PHART", "HCO3" in the last 72 hours.  Invalid input(s): "PCO2", "PO2"  Studies/Results: CT ABDOMEN PELVIS W CONTRAST  Result Date: 07/09/2023 CLINICAL DATA:  Abdominal pain, status post exploratory laparotomy with small bowel resection and omental biopsy EXAM: CT ABDOMEN AND PELVIS WITH CONTRAST TECHNIQUE: Multidetector CT imaging of the abdomen and pelvis was performed using the standard protocol following bolus administration of intravenous contrast. RADIATION DOSE REDUCTION: This exam was performed according to the departmental dose-optimization program which includes automated exposure control, adjustment of the mA and/or kV according to patient size and/or use of iterative reconstruction technique. CONTRAST:  OMNIPAQUE IOHEXOL 300 MG/ML  SOLN COMPARISON:  MRI  abdomen dated 07/03/2023. CT abdomen/pelvis dated 06/22/2023. FINDINGS: Lower chest: Mild bibasilar atelectasis. Hepatobiliary: Subcentimeter lesion in the posterior right hepatic lobe (series 10/image 22), suspicious for metastasis on MR. Layering tiny gallstones (series 2/image 34), without associated linear changes. No intrahepatic or extrahepatic duct dilatation. Pancreas: Within normal limits. Spleen: Within normal limits Adrenals/Urinary Tract: Adrenal glands within normal limits. Kidneys are within normal limits.  No hydronephrosis. Bladder is within normal limits with trace nondependent gas. Stomach/Bowel: Stomach is within normal limits. No evidence of bowel obstruction, improved. Status post small bowel resection with suture line in the right lateral abdomen (series 2/image 60). Adjacent long segment wall thickening involving small bowel in the right lower abdomen (series 2/image 85), new, raising concern for at risk bowel. Adjacent wall thickening/inflammatory changes involving the cecum (series 2, image 54), new. No pneumatosis or free air. Adjacent fluid collection in the right lower quadrant measuring 3.5 x 3.1 x 5.5 cm (series 2/image 64), possibly reflecting a postsurgical abscess, noting that the appendix is not visualized. Mild sigmoid diverticulosis, without diverticulitis. Vascular/Lymphatic: No evidence of abdominal aortic aneurysm. Small retroperitoneal lymph nodes which do not meet pathologic CT size criteria. Reproductive: Uterus is grossly unremarkable The ovaries are poorly visualized. Other: 5.4 x 7.8 cm complex cystic peritoneal implant in the dependent pelvis (series 2/image 94). Scattered peritoneal nodularity measuring up to 18 mm medial left in the anterior abdominal wall (series 2/image 54). Some of the recent peritoneal implants on CT are no longer evident, likely surgically excised. Musculoskeletal: Visualized osseous structures are within normal  limits. IMPRESSION: Status post  small bowel resection with suture line in the right lateral abdomen. Adjacent long segment wall thickening involving the distal small bowel and cecal wall thickening, new, raising concern for at risk bowel. No pneumatosis or free air. Consider surgical evaluation as clinically warranted. 5.5 cm right lower quadrant fluid collection, possibly reflecting a postsurgical abscess, noting that the appendix is not visualized. 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 Additional ancillary findings as above. Electronically Signed   By: Charline Bills M.D.   On: 07/09/2023 18:40    Anti-infectives: Anti-infectives (From admission, onward)    Start     Dose/Rate Route Frequency Ordered Stop   07/09/23 1600  piperacillin-tazobactam (ZOSYN) IVPB 3.375 g        3.375 g 12.5 mL/hr over 240 Minutes Intravenous Every 8 hours 07/09/23 1510     06/30/23 0830  ceFAZolin (ANCEF) IVPB 2g/100 mL premix        2 g 200 mL/hr over 30 Minutes Intravenous On call to O.R. 06/30/23 0740 06/30/23 1227       Assessment/Plan: s/p Procedure(s): EXPLORATORY LAPAROTOMY, SMALL BOWEL RESECTION, AND EXCISIONAL BIOPSIES OF MULTIPLE OMENTAL MASSES (N/A) Plan for IR drain of abd fluid collection Continue IV zosyn POD#11 s/p ex lap, SBR and biopsy of multiple sites 9/9 Dr. Fredricka Bonine - Seems to still have some ileus. No emesis and is having some bowel function Wbc improving - 3 staples removed from inferior portion of wound 9/16 for infection, additional 1 staple removed 9/18. Pack BID and PRN saturation. Shower with wound open. -CT with some inflammatory changes near anastomosis, continue antibiotics   I reviewed last 24 h vitals and pain scores, last 48 h intake and output, last 24 h labs and trends, and last 24 h imaging results.   This care required moderate level of medical decision making.    LOS: 21 days    Linda [name] 07/11/2023

## 2023-07-11 NOTE — Progress Notes (Signed)
PHARMACY - TOTAL PARENTERAL NUTRITION CONSULT NOTE   Indication: Small bowel obstruction and ileus  Patient Measurements: Height: 5\' 4"  (162.6 cm) Weight: 91.9 kg (202 lb 9.6 oz) IBW/kg (Calculated) : 54.7 TPN AdjBW (KG): 64.5 Body mass index is 34.78 kg/m.  Assessment:  Pharmacy consulted to start TPN on 57 yo female diagnosed with small bowel obstruction. S/p exploratory laparotomy on 9/9 and now awaiting return of bowel function.   - 9/18: Resume TPN due to persistent ileus with abdominal pain, nausea, decreased po intake and early satiety.   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 - 9/12 MRI liver: persistently dilated SB loops; possible peritoneal mets; solitary 2 cm metastatic mass in liver. GI Surgeries / Procedures:  - 9/9 exploratory laparotomy with possible bowel obstruction resection/G-tube placement: Right sided pelvic mass, small bowel resection. Multiple omental and abdominal wall excisional biopsies. (+ for adenocarcinoma)   Central access: PICC placed for TPN 9/2  TPN start date: 9/2>>9/16, resume 9/18>>  Nutritional Goals: Goal TPN rate is 80 mL/hr (provides ~ 90 g of protein and 1820 kcals per day)  RD Assessment: Estimated Needs Total Energy Estimated Needs: 1700-1900 Total Protein Estimated Needs: 85-100g Total Fluid Estimated Needs: 1.9L/day  Current  Nutrition:  - Back to NPO - 9/18: Restart TPN for persistent ileus   Plan:  Now:  - potassium chloride 10 mEq IV x2 runs  At 1800: Continue TPN at goal rate of 80 ml/hr  Electrolytes in TPN:  Increase Na to 150 mEq/L K 60 mEq/L Ca 5 mEq/L Mg 5 mEq/L Phos 7 mmol/L  Cl:Ac 1:2 Add standard MVI and trace elements to TPN Chromium on hold d/t critical shortage sSSI and cbgs q6h Monitor TPN labs on Mon/Thurs and PRN Bmet, mag, phos and Triglyceride on 9/21  Dorna Leitz, PharmD, BCPS 07/11/2023 9:31 AM

## 2023-07-11 NOTE — Progress Notes (Signed)
Progress Note    Linda [name]   ZOX:[id number]  DOB: [birth year]  DOA: 06/20/2023     21 PCP: Arnette Felts, [name]    Hospital Course: Linda [name] is a 57 yo female with PMH anemia, HTN, pre-DM who presented with abdominal pain.  She recently underwent EGD on 05/08/2023.  She was started on H. pylori treatment after procedure.  She completed treatment course approximately 2 weeks prior to admission. She has also been on oral iron and having dark stools. Due to worsening weakness and ongoing abdominal discomfort, she presented to the ER for further evaluation. Hemoglobin was found to be 5.9 g/dL.  She was admitted for blood transfusion and GI evaluation. She had recent colonoscopy and upper endoscopy performed June 2023.  She was not recommended for repeat scopes.  RUQ ultrasound showed hepatic steatosis and hypoechoic region in the right lobe liver.  Due to abdominal pain on admission, further workup commenced with CT abdomen/pelvis in case of gallbladder pathology. CT was notable for multiple findings.  There was a large mass measuring 4.7 x 3 cm noted in the right lower quadrant and also large heterogenous mass superior to the cervix measuring 7.9 x 6.1 cm. Multiple dilated loops of bowel concerning for compression/obstruction were also appreciated. She also has a recent history over the past year of dysfunctional uterine bleeding and thickened endometrium noted on outpatient ultrasound and again seen on CT.  She had declined outpatient endometrial biopsy.  Due to these findings, further workup commenced with consulting surgery, oncology, and gyn-oncology. Tumor markers ordered as well.  On 9/9, she ultimately underwent ex-lap with distal small bowel resection with primary anastomosis along with excisional biopsy of pelvic, multiple abdominal wall and omental masses.  Slow to improve. Drain placed 9/20 by IR.   Interval History:   No overnight event   Assessment and Plan: Colon  adenocarcinoma (HCC) - per CT there is 4.7 x 3 cm mass in RLL and mass superior to cervix 7.9 x 6.1 cm. Known thickened endometrium again seen and recent hx DUB that was being monitored outpatient.  - initial concern was for GYN malignancy; this has now been ruled out after ex-lap and pathology results peritoneal nodule needle core biopsy from 06/24/2023; endometrial biopsy also negative from 06/24/2023 -Patient underwent ex lap with distal small bowel resection with primary anastomosis, excisional biopsy of pelvic, multiple abdominal wall and omental masses on 06/30/2023 - general surgery following- CT scan on 9/18:-CT with some inflammatory changes near anastomosis, continue antibiotics  - oncology also now on board and following for further outpatient plans 3 staples removed from inferior portion of wound 9/16 for infection, additional 1 staple removed 9/18. Pack BID and PRN saturation. Shower with wound open.  -drain placed 9/20 by IR  SBO (small bowel obstruction) (HCC) - Presented with distended abdomen and decreased bowel sounds.  Initially suspected constipation contributing but after CT, concern is now for underlying obstruction which may be due to mass effect - general surgery following as well - now s/p ex-lap with small bowel resection with primary anastomosis and excisional biopsy of pelvic, multi abdominal wall, and omental masses on 06/30/2023 -NPO per GS  Microcytic anemia - Multifactorial etiology at this point.  Overt iron deficiency on admission, newly diagnosed malignancy, and some expected blood loss from recent surgery on 06/30/2023 -Still needs DVT prophylaxis with HSQ given high risk of hypercoagulability/VTE; also needs Khorana score (currently at least 3 points, high risk) calculated prior to discharge and/or before  treatment most likely   IDA (iron deficiency anemia) - on admission: iron labs: Iron 9, sat ratio 2%, TIBC 444. Ferritin 8 - s/p 4 units PRBC so far since admission  and received ferrlicit  Upper GI bleed-resolved as of 07/03/2023 - Possibly slow/chronic blood loss from underlying "chronic inactive gastritis" noted on EGD from 05/08/2023.  No H. pylori organisms were noted on immunohistochemical stain but due to pattern of inflammation, she was started on H. pylori treatment which she completed approximately 2 weeks prior to admission -Stools were melanotic in appearance but she states she has also been on oral iron daily -Hemoglobin certainly low on admission, 5.9 g/dL.   - now also s/p ex-lap with presumed some blood loss as expected - will continue to trend H/H and transfuse as necessary - no further scopes per GI at this time  Type 2 diabetes mellitus with obesity (HCC) - Last A1c 6.5% on 12/05/2022 - SSI ordered in setting of TPN use  - once TPN fully off, will d/c SSI  Essential hypertension - hold ARB in setting of above - PRN meds to be added; can make scheduled if needed    DVT prophylaxis:  heparin injection 5,000 Units Start: 07/11/23 2200 SCDs Start: 06/20/23 2059   Code Status:   Code Status: Full Code  Mobility Assessment (Last 72 Hours)     Mobility Assessment     Row Name 07/10/23 2100 07/10/23 0800 07/09/23 2045 07/09/23 0857 07/08/23 2049   Does patient have an order for bedrest or is patient medically unstable No - Continue assessment No - Continue assessment No - Continue assessment No - Continue assessment No - Continue assessment   What is the highest level of mobility based on the progressive mobility assessment? Level 6 (Walks independently in room and hall) - Balance while walking in room without assist - Complete Level 6 (Walks independently in room and hall) - Balance while walking in room without assist - Complete Level 6 (Walks independently in room and hall) - Balance while walking in room without assist - Complete Level 6 (Walks independently in room and hall) - Balance while walking in room without assist - Complete  Level 6 (Walks independently in room and hall) - Balance while walking in room without assist - Complete            Disposition Plan: Home Status is: Inpatient  Objective: Blood pressure 138/63, pulse 68, temperature 98.7 F (37.1 C), temperature source Oral, resp. rate 16, height 5\' 4"  (1.626 m), weight 91.9 kg, SpO2 98%.  Examination:      General: Appearance:    Obese female in no acute distress     Lungs:     respirations unlabored  Heart:    Normal heart rate. Normal rhythm. No murmurs, rubs, or gallops.   MS:   All extremities are intact.   Neurologic:   Awake, alert    Consultants:  GI - signed off General surgery Gyn-onc - signed off Oncology IR  Procedures:  06/30/23: Exploratory laparotomy, distal small bowel resection with primary anastomosis, excisional biopsy of pelvic, multiple abdominal wall and omental masses   Data Reviewed: Results for orders placed or performed during the hospital encounter of 06/20/23 (from the past 24 hour(s))  Glucose, capillary     Status: Abnormal   Collection Time: 07/10/23  4:41 PM  Result Value Ref Range   Glucose-Capillary 106 (H) 70 - 99 mg/dL  Glucose, capillary     Status: Abnormal  Collection Time: 07/10/23  9:15 PM  Result Value Ref Range   Glucose-Capillary 117 (H) 70 - 99 mg/dL  Glucose, capillary     Status: Abnormal   Collection Time: 07/11/23  1:10 AM  Result Value Ref Range   Glucose-Capillary 142 (H) 70 - 99 mg/dL  CBC with Differential/Platelet     Status: Abnormal   Collection Time: 07/11/23  4:17 AM  Result Value Ref Range   WBC 12.3 (H) 4.0 - 10.5 K/uL   RBC 3.00 (L) 3.87 - 5.11 MIL/uL   Hemoglobin 7.3 (L) 12.0 - 15.0 g/dL   HCT 60.6 (L) 30.1 - 60.1 %   MCV 86.3 80.0 - 100.0 fL   MCH 24.3 (L) 26.0 - 34.0 pg   MCHC 28.2 (L) 30.0 - 36.0 g/dL   RDW 09.3 (H) 23.5 - 57.3 %   Platelets 383 150 - 400 K/uL   nRBC 0.0 0.0 - 0.2 %   Neutrophils Relative % 81 %   Neutro Abs 9.9 (H) 1.7 - 7.7 K/uL    Lymphocytes Relative 10 %   Lymphs Abs 1.3 0.7 - 4.0 K/uL   Monocytes Relative 5 %   Monocytes Absolute 0.7 0.1 - 1.0 K/uL   Eosinophils Relative 2 %   Eosinophils Absolute 0.2 0.0 - 0.5 K/uL   Basophils Relative 0 %   Basophils Absolute 0.0 0.0 - 0.1 K/uL   Immature Granulocytes 2 %   Abs Immature Granulocytes 0.18 (H) 0.00 - 0.07 K/uL   Polychromasia PRESENT    Ovalocytes PRESENT   Basic metabolic panel     Status: Abnormal   Collection Time: 07/11/23  4:17 AM  Result Value Ref Range   Sodium 133 (L) 135 - 145 mmol/L   Potassium 3.7 3.5 - 5.1 mmol/L   Chloride 103 98 - 111 mmol/L   CO2 21 (L) 22 - 32 mmol/L   Glucose, Bld 124 (H) 70 - 99 mg/dL   BUN 7 6 - 20 mg/dL   Creatinine, Ser 2.20 0.44 - 1.00 mg/dL   Calcium 7.5 (L) 8.9 - 10.3 mg/dL   GFR, Estimated >25 >42 mL/min   Anion gap 9 5 - 15  Protime-INR     Status: None   Collection Time: 07/11/23  4:17 AM  Result Value Ref Range   Prothrombin Time 15.2 11.4 - 15.2 seconds   INR 1.2 0.8 - 1.2  Glucose, capillary     Status: Abnormal   Collection Time: 07/11/23  4:36 AM  Result Value Ref Range   Glucose-Capillary 130 (H) 70 - 99 mg/dL  Glucose, capillary     Status: Abnormal   Collection Time: 07/11/23  7:14 AM  Result Value Ref Range   Glucose-Capillary 135 (H) 70 - 99 mg/dL  Glucose, capillary     Status: Abnormal   Collection Time: 07/11/23 12:08 PM  Result Value Ref Range   Glucose-Capillary 117 (H) 70 - 99 mg/dL    I have reviewed pertinent nursing notes, vitals, labs, and images as necessary. I have ordered labwork to follow up on as indicated.  I have reviewed the last notes from staff over past 24 hours. I have discussed patient's care plan and test results with nursing staff, CM/SW, and other staff as appropriate.    LOS: 21 days   Joseph Art, DO [facility] 07/11/2023, 1:01 PM

## 2023-07-11 NOTE — Plan of Care (Signed)

## 2023-07-11 NOTE — Plan of Care (Signed)

## 2023-07-11 NOTE — Procedures (Signed)
Interventional Radiology Procedure Note  Procedure: CT guided abdominal drain placement  Findings: Please refer to procedural dictation for full description.10 Fr pigtail drain in RLQ abscess.  Approximately 50 mL purulent fluid aspirated.  Drain to bulb suction.  Complications: None immediate  Estimated Blood Loss: < 5 mL  Recommendations: Keep to bulb suction, for now. Follow culture. IR will follow.   Marliss Coots, MD

## 2023-07-12 DIAGNOSIS — C179 Malignant neoplasm of small intestine, unspecified: Secondary | ICD-10-CM | POA: Diagnosis not present

## 2023-07-12 LAB — GLUCOSE, CAPILLARY
Glucose-Capillary: 130 mg/dL — ABNORMAL HIGH (ref 70–99)
Glucose-Capillary: 140 mg/dL — ABNORMAL HIGH (ref 70–99)
Glucose-Capillary: 146 mg/dL — ABNORMAL HIGH (ref 70–99)
Glucose-Capillary: 164 mg/dL — ABNORMAL HIGH (ref 70–99)

## 2023-07-12 LAB — CBC WITH DIFFERENTIAL/PLATELET
Abs Immature Granulocytes: 0.21 10*3/uL — ABNORMAL HIGH (ref 0.00–0.07)
Basophils Absolute: 0 10*3/uL (ref 0.0–0.1)
Basophils Relative: 0 %
Eosinophils Absolute: 0.2 10*3/uL (ref 0.0–0.5)
Eosinophils Relative: 1 %
HCT: 27.7 % — ABNORMAL LOW (ref 36.0–46.0)
Hemoglobin: 7.7 g/dL — ABNORMAL LOW (ref 12.0–15.0)
Immature Granulocytes: 1 %
Lymphocytes Relative: 10 %
Lymphs Abs: 1.5 10*3/uL (ref 0.7–4.0)
MCH: 24.3 pg — ABNORMAL LOW (ref 26.0–34.0)
MCHC: 27.8 g/dL — ABNORMAL LOW (ref 30.0–36.0)
MCV: 87.4 fL (ref 80.0–100.0)
Monocytes Absolute: 0.6 10*3/uL (ref 0.1–1.0)
Monocytes Relative: 4 %
Neutro Abs: 12 10*3/uL — ABNORMAL HIGH (ref 1.7–7.7)
Neutrophils Relative %: 84 %
Platelets: 419 10*3/uL — ABNORMAL HIGH (ref 150–400)
RBC: 3.17 MIL/uL — ABNORMAL LOW (ref 3.87–5.11)
RDW: 28.4 % — ABNORMAL HIGH (ref 11.5–15.5)
WBC: 14.2 10*3/uL — ABNORMAL HIGH (ref 4.0–10.5)
nRBC: 0 % (ref 0.0–0.2)

## 2023-07-12 LAB — BASIC METABOLIC PANEL
Anion gap: 11 (ref 5–15)
BUN: 8 mg/dL (ref 6–20)
CO2: 23 mmol/L (ref 22–32)
Calcium: 8.1 mg/dL — ABNORMAL LOW (ref 8.9–10.3)
Chloride: 102 mmol/L (ref 98–111)
Creatinine, Ser: 0.65 mg/dL (ref 0.44–1.00)
GFR, Estimated: >60 mL/min (ref >60)
Glucose, Bld: 141 mg/dL — ABNORMAL HIGH (ref 70–99)
Potassium: 4 mmol/L (ref 3.5–5.1)
Sodium: 136 mmol/L (ref 135–145)

## 2023-07-12 LAB — TRIGLYCERIDES: Triglycerides: 137 mg/dL (ref <150)

## 2023-07-12 LAB — PHOSPHORUS: Phosphorus: 2.6 mg/dL (ref 2.5–4.6)

## 2023-07-12 LAB — MAGNESIUM: Magnesium: 2.2 mg/dL (ref 1.7–2.4)

## 2023-07-12 MED ORDER — SODIUM PHOSPHATES 45 MMOLE/15ML IV SOLN
10.0000 mmol | Freq: Once | INTRAVENOUS | Status: AC
  Administered 2023-07-12: 10 mmol via INTRAVENOUS
  Filled 2023-07-12: qty 3.33

## 2023-07-12 MED ORDER — TRAVASOL 10 % IV SOLN
INTRAVENOUS | Status: AC
  Filled 2023-07-12: qty 902.4

## 2023-07-12 NOTE — Progress Notes (Signed)
Referring Physician(s): Kinsinger,L   Supervising Physician: Marliss Coots  Patient Status:  [facility] - In-pt  Chief Complaint:  RLQ abscess S/p aspiration and drain placement by Dr. Elby Showers on 9/20.   Subjective:  Patient laying in bed, NAD.  Reports mild abdominal pain, states that the patient is constant and pain meds has not helped much.  Denies N/V, fever.   Allergies: Patient has no known allergies.  Medications: Prior to Admission medications   Medication Sig Start Date End Date Taking? Authorizing Provider  Ferric Maltol (ACCRUFER) 30 MG CAPS Take 1 capsule (30 mg total) by mouth daily. 03/05/23  Yes Arnette Felts, [name]  linaclotide [facility]) 290 MCG CAPS capsule Take 1 capsule (290 mcg total) by mouth 20 minutes before breakfast 05/09/23  Yes   Semaglutide (RYBELSUS) 7 MG TABS Take 1 tablet (7 mg total) by mouth daily. 01/13/23  Yes Arnette Felts, [name]  valsartan (DIOVAN) 160 MG tablet Take 1 tablet (160 mg total) by mouth daily. 10/03/22 10/03/23 Yes Arnette Felts, [name]  amoxicillin (AMOXIL) 500 MG capsule Take 2 capsules (1,000 mg total) by mouth 2 (two) times daily for 14 days Patient not taking: Reported on 06/20/2023 05/19/23     clarithromycin (BIAXIN) 500 MG tablet Take 1 tablet (500 mg total) by mouth every 12 (twelve) hours for 14 days Patient not taking: Reported on 06/20/2023 05/19/23     ferrous sulfate 325 (65 FE) MG tablet Take 1 tablet (325 mg total) by mouth daily. Patient not taking: Reported on 03/05/2023 02/22/22   Dione Booze, MD  fluticasone [facility]) 50 MCG/ACT nasal spray Place 2 sprays into both nostrils daily. Patient not taking: Reported on 03/05/2023 07/17/22   Henderly, Britni A, PA-C  lansoprazole (PREVACID) 30 MG capsule Take 1 capsule (30 mg total) by mouth 2 (two) times daily for 14 days Patient not taking: Reported on 06/20/2023 05/19/23     Vitamin D, Ergocalciferol, (DRISDOL) 1.25 MG (50000 UNIT) CAPS capsule Take 1 capsule (50,000 Units total) by  mouth 2 (two) times a week. Patient not taking: Reported on 06/20/2023 01/23/23   Arnette Felts, [name]     Vital Signs: BP 103/63 (BP Location: Left Arm)   Pulse 67   Temp 98.8 F (37.1 C) (Oral)   Resp 16   Ht 5\' 4"  (1.626 m)   Wt 198 lb 3.1 oz (89.9 kg)   SpO2 98%   BMI 34.02 kg/m   Physical Exam Vitals reviewed.  Constitutional:      General: She is not in acute distress.    Appearance: She is not ill-appearing.  HENT:     Head: Normocephalic and atraumatic.  Pulmonary:     Effort: Pulmonary effort is normal.  Abdominal:     General: Abdomen is flat.     Palpations: Abdomen is soft.     Comments: Positive RLQ drain to a suction bulb. Site is unremarkable with no erythema, edema, tenderness, bleeding or drainage. Suture and stat lock in place. Dressing is clean, dry, and intact. ~10 ml of  bloody, purulent fluid noted in the bulb. Drain aspirates and flushes well.    Skin:    General: Skin is warm and dry.     Coloration: Skin is not jaundiced.  Neurological:     Mental Status: She is alert.  Psychiatric:        Mood and Affect: Mood normal.        Behavior: Behavior normal.     Imaging: CT GUIDED PERITONEAL/RETROPERITONEAL FLUID  DRAIN BY PERC CATH  Result Date: 07/11/2023 INDICATION: 57 year old female with history of right lower quadrant postsurgical abdominal fluid collection concerning for abscess. EXAM: CT PERC DRAIN PERITONEAL ABCESS COMPARISON:  None Available. MEDICATIONS: The patient is currently admitted to the hospital and receiving intravenous antibiotics. The antibiotics were administered within an appropriate time frame prior to the initiation of the procedure. ANESTHESIA/SEDATION: Moderate (conscious) sedation was employed during this procedure. A total of Versed 2 mg and Fentanyl 100 mcg was administered intravenously. Moderate Sedation Time: 19 minutes. The patient's level of consciousness and vital signs were monitored continuously by radiology nursing  throughout the procedure under my direct supervision. CONTRAST:  None COMPLICATIONS: None immediate. PROCEDURE: RADIATION DOSE REDUCTION: This exam was performed according to the departmental dose-optimization program which includes automated exposure control, adjustment of the mA and/or kV according to patient size and/or use of iterative reconstruction technique. Informed written consent was obtained from the patient after a discussion of the risks, benefits and alternatives to treatment. The patient was placed supine on the CT gantry and a pre procedural CT was performed re-demonstrating the known abscess/fluid collection within the right lower quadrant. The procedure was planned. A timeout was performed prior to the initiation of the procedure. The right lower quadrant was prepped and draped in the usual sterile fashion. The overlying soft tissues were anesthetized with 1% lidocaine with epinephrine. Appropriate trajectory was planned with the use of a 22 gauge spinal needle. An 18 gauge trocar needle was advanced into the abscess/fluid collection and a short Amplatz super stiff wire was coiled within the collection. Appropriate positioning was confirmed with a limited CT scan. The tract was serially dilated allowing placement of a 10 [place] all-purpose drainage catheter. Appropriate positioning was confirmed with a limited postprocedural CT scan. Approximately 50 ml of purulent fluid was aspirated. The tube was connected to a bulb suction and sutured in place. A dressing was placed. The patient tolerated the procedure well without immediate post procedural complication. IMPRESSION: Successful CT guided placement of a 10 French all purpose drain catheter into the right lower quadrant fluid collection with aspiration of approximately 50 mL of purulent fluid. Samples were sent to the laboratory as requested by the ordering clinical team. Marliss Coots, MD [facility]  Radiology Electronically Signed   By: Marliss Coots M.D.   On: 07/11/2023 15:21   CT ABDOMEN PELVIS W CONTRAST  Result Date: 07/09/2023 CLINICAL DATA:  Abdominal pain, status post exploratory laparotomy with small bowel resection and omental biopsy EXAM: CT ABDOMEN AND PELVIS WITH CONTRAST TECHNIQUE: Multidetector CT imaging of the abdomen and pelvis was performed using the standard protocol following bolus administration of intravenous contrast. RADIATION DOSE REDUCTION: This exam was performed according to the departmental dose-optimization program which includes automated exposure control, adjustment of the mA and/or kV according to patient size and/or use of iterative reconstruction technique. CONTRAST:  OMNIPAQUE IOHEXOL 300 MG/ML  SOLN COMPARISON:  MRI abdomen dated 07/03/2023. CT abdomen/pelvis dated 06/22/2023. FINDINGS: Lower chest: Mild bibasilar atelectasis. Hepatobiliary: Subcentimeter lesion in the posterior right hepatic lobe (series 10/image 22), suspicious for metastasis on MR. Layering tiny gallstones (series 2/image 34), without associated linear changes. No intrahepatic or extrahepatic duct dilatation. Pancreas: Within normal limits. Spleen: Within normal limits Adrenals/Urinary Tract: Adrenal glands within normal limits. Kidneys are within normal limits.  No hydronephrosis. Bladder is within normal limits with trace nondependent gas. Stomach/Bowel: Stomach is within normal limits. No evidence of bowel obstruction, improved. Status  post small bowel resection with suture line in the right lateral abdomen (series 2/image 60). Adjacent long segment wall thickening involving small bowel in the right lower abdomen (series 2/image 85), new, raising concern for at risk bowel. Adjacent wall thickening/inflammatory changes involving the cecum (series 2, image 54), new. No pneumatosis or free air. Adjacent fluid collection in the right lower quadrant measuring 3.5 x 3.1 x 5.5 cm (series 2/image  64), possibly reflecting a postsurgical abscess, noting that the appendix is not visualized. Mild sigmoid diverticulosis, without diverticulitis. Vascular/Lymphatic: No evidence of abdominal aortic aneurysm. Small retroperitoneal lymph nodes which do not meet pathologic CT size criteria. Reproductive: Uterus is grossly unremarkable The ovaries are poorly visualized. Other: 5.4 x 7.8 cm complex cystic peritoneal implant in the dependent pelvis (series 2/image 94). Scattered peritoneal nodularity measuring up to 18 mm medial left in the anterior abdominal wall (series 2/image 54). Some of the recent peritoneal implants on CT are no longer evident, likely surgically excised. Musculoskeletal: Visualized osseous structures are within normal limits. IMPRESSION: Status post small bowel resection with suture line in the right lateral abdomen. Adjacent long segment wall thickening involving the distal small bowel and cecal wall thickening, new, raising concern for at risk bowel. No pneumatosis or free air. Consider surgical evaluation as clinically warranted. 5.5 cm right lower quadrant fluid collection, possibly reflecting a postsurgical abscess, noting that the appendix is not visualized. 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 Additional ancillary findings as above. Electronically Signed   By: Charline Bills M.D.   On: 07/09/2023 18:40   IR IMAGING GUIDED PORT INSERTION  Result Date: 07/08/2023 INDICATION: 57 year old female with history of colon cancer presenting for Port-A-Cath placement. EXAM: IMPLANTED PORT A CATH PLACEMENT WITH ULTRASOUND AND FLUOROSCOPIC GUIDANCE COMPARISON:  None Available. MEDICATIONS: None. ANESTHESIA/SEDATION: Moderate (conscious) sedation was employed during this procedure. A total of Versed 2 mg and Fentanyl 100 mcg was administered intravenously.  Moderate Sedation Time: 15 minutes. The patient's level of consciousness and vital signs were monitored continuously by radiology nursing throughout the procedure under my direct supervision. CONTRAST:  None FLUOROSCOPY TIME:  Five mGy COMPLICATIONS: None immediate. PROCEDURE: The procedure, risks, benefits, and alternatives were explained to the patient. Questions regarding the procedure were encouraged and answered. The patient understands and consents to the procedure. The right neck and chest were prepped with chlorhexidine in a sterile fashion, and a sterile drape was applied covering the operative field. Maximum barrier sterile technique with sterile gowns and gloves were used for the procedure. A timeout was performed prior to the initiation of the procedure. Ultrasound was used to examine the jugular vein which was compressible and free of internal echoes. A skin marker was used to demarcate the planned venotomy and port pocket incision sites. Local anesthesia was provided to these sites and the subcutaneous tunnel track with 1% lidocaine with 1:100,000 epinephrine. A small incision was created at the jugular access site and blunt dissection was performed of the subcutaneous tissues. Under ultrasound guidance, the jugular vein was accessed with a 21 ga micropuncture needle and an 0.018" wire was inserted to the superior vena cava. Real-time ultrasound guidance was utilized for vascular access including the acquisition of a permanent ultrasound image documenting patency of the accessed vessel. A 5 Fr micopuncture set was then used, through which a 0.035" Rosen wire was passed under fluoroscopic guidance into the inferior vena  cava. An 8 Fr dilator was then placed over the wire. A subcutaneous port pocket was then created along the upper chest wall utilizing a combination of sharp and blunt dissection. The pocket was irrigated with sterile saline, packed with gauze, and observed for hemorrhage. A single lumen  plastic power injectable port was chosen for placement. The 8 Fr catheter was tunneled from the port pocket site to the venotomy incision. The port was placed in the pocket. The external catheter was trimmed to appropriate length. The dilator was exchanged for an 8 Fr peel-away sheath under fluoroscopic guidance. The catheter was then placed through the sheath and the sheath was removed. Final catheter positioning was confirmed and documented with a fluoroscopic spot radiograph. The port was accessed with a Huber needle, aspirated, and flushed with heparinized saline. The deep dermal layer of the port pocket incision was closed with interrupted 3-0 Vicryl suture. The skin was opposed with a running subcuticular 4-0 Monocryl suture. Dermabond was then placed over the port pocket and neck incisions. The patient tolerated the procedure well without immediate post procedural complication. FINDINGS: After catheter placement, the tip lies within the superior cavoatrial junction. The catheter aspirates and flushes normally and is ready for immediate use. IMPRESSION: Successful placement of a power injectable Port-A-Cath via the right internal jugular vein. The catheter is ready for immediate use. Marliss Coots, MD [facility] Radiology Electronically Signed   By: Marliss Coots M.D.   On: 07/08/2023 15:15    Labs:  CBC: Recent Labs    07/09/23 0328 07/10/23 0421 07/11/23 0417 07/12/23 0444  WBC 18.6* 18.0* 12.3* 14.2*  HGB 8.9* 7.7* 7.3* 7.7*  HCT 30.2* 26.4* 25.9* 27.7*  PLT 502* 411* 383 419*    COAGS: Recent Labs    06/20/23 1436 06/24/23 0942 07/11/23 0417  INR 1.1 1.2 1.2    BMP: Recent Labs    07/07/23 0454 07/10/23 0421 07/11/23 0417 07/12/23 0444  NA 134* 133* 133* 136  K 3.8 3.7 3.7 4.0  CL 97* 101 103 102  CO2 27 25 21* 23  GLUCOSE 110* 115* 124* 141*  BUN 9 7 7 8   CALCIUM 8.6* 7.8* 7.5* 8.1*  CREATININE 0.77 0.75 0.65 0.65   GFRNONAA >60 >60 >60 >60    LIVER FUNCTION TESTS: Recent Labs    07/01/23 0515 07/03/23 0354 07/07/23 0454 07/10/23 0421  BILITOT 1.8* 0.9 0.6 0.4  AST 15 13* 20 41  ALT 11 12 15 29   ALKPHOS 67 79 137* 125  PROT 6.4* 6.4* 7.3 6.5  ALBUMIN 2.7* 2.4* 2.4* 1.9*    Assessment and Plan:  57 y.o. female with colon CA s/p  ex-lap with distal small bowel resection and excisional biopsy of pelvic, abdominal wall, and omental masses on 06/30/23 who developed RLQ intraabdominal abscess, s/p aspiration and drain placement by Dr. Elby Showers on 07/11/23. 50 mL purulent fluid aspirated.   VSS WBC trended up, 14.2 (12.3 yesterday); plt 419 (383 yesterday)  Output 35 mL overnight, appears purulent, blood tinged  Cx pending, GPC, GPR, GNR ro far   Drain Location: RLQ Size: Fr size: 10 Fr Date of placement: 9/20  Currently to: Drain collection device: suction bulb 24 hour output:  Output by Drain (mL) 07/10/23 0701 - 07/10/23 1900 07/10/23 1901 - 07/11/23 0700 07/11/23 0701 - 07/11/23 1900 07/11/23 1901 - 07/12/23 0700 07/12/23 0701 - 07/12/23 1051  Closed System Drain 1 Right RLQ Bulb (JP) 10 Fr.   25 10  Interval imaging/drain manipulation:  None   Current examination: Flushes/aspirates easily.  Insertion site unremarkable. Suture and stat lock in place. Dressed appropriately.   Plan: Continue TID flushes with 5 cc NS. Record output Q shift. Dressing changes QD or PRN if soiled.  Call IR APP or on call IR MD if difficulty flushing or sudden change in drain output.  Repeat imaging/possible drain injection once output < 10 mL/QD (excluding flush material). Consideration for drain removal if output is < 10 mL/QD (excluding flush material), pending discussion with the providing surgical service.  Discharge planning: Please contact IR APP or on call IR MD prior to patient d/c to ensure appropriate follow up plans are in place. Typically patient will follow up with IR clinic 10-14 days  post d/c for repeat imaging/possible drain injection. IR scheduler will contact patient with date/time of appointment. Patient will need to flush drain QD with 5 cc NS, record output QD, dressing changes every 2-3 days or earlier if soiled.   IR will continue to follow - please call with questions or concerns.   Electronically Signed: Willette Brace, PA-C 07/12/2023, 10:48 AM   I spent a total of 15 Minutes at the the patient's bedside AND on the patient's hospital floor or unit, greater than 50% of which was counseling/coordinating care for RLQ drain f/u.   This chart was dictated using voice recognition software.  Despite best efforts to proofread,  errors can occur which can change the documentation meaning.

## 2023-07-12 NOTE — Plan of Care (Signed)
  Problem: Clinical Measurements: Goal: Will remain free from infection Outcome: Progressing   Problem: Activity: Goal: Risk for activity intolerance will decrease Outcome: Progressing   Problem: Nutrition: Goal: Adequate nutrition will be maintained Outcome: Progressing   Problem: Coping: Goal: Level of anxiety will decrease Outcome: Progressing   Problem: Elimination: Goal: Will not experience complications related to bowel motility Outcome: Progressing Goal: Will not experience complications related to urinary retention Outcome: Progressing   Problem: Safety: Goal: Ability to remain free from injury will improve Outcome: Progressing   Problem: Skin Integrity: Goal: Risk for impaired skin integrity will decrease Outcome: Progressing   Problem: Coping: Goal: Ability to adjust to condition or change in health will improve Outcome: Progressing   Problem: Fluid Volume: Goal: Ability to maintain a balanced intake and output will improve Outcome: Progressing   Problem: Health Behavior/Discharge Planning: Goal: Ability to identify and utilize available resources and services will improve Outcome: Progressing   Problem: Nutritional: Goal: Maintenance of adequate nutrition will improve Outcome: Progressing   Problem: Skin Integrity: Goal: Risk for impaired skin integrity will decrease Outcome: Progressing   Problem: Tissue Perfusion: Goal: Adequacy of tissue perfusion will improve Outcome: Progressing

## 2023-07-12 NOTE — Progress Notes (Signed)
Progress Note    Linda [name]   WUJ:[id number]  DOB: 1966-09-15  DOA: 06/20/2023     22 PCP: Arnette Felts, [name]    Hospital Course: Linda [name] is a 57 yo female with PMH anemia, HTN, pre-DM who presented with abdominal pain.  She recently underwent EGD on 05/08/2023.  She was started on H. pylori treatment after procedure.  She completed treatment course approximately 2 weeks prior to admission. She has also been on oral iron and having dark stools. Due to worsening weakness and ongoing abdominal discomfort, she presented to the ER for further evaluation. Hemoglobin was found to be 5.9 g/dL.  She was admitted for blood transfusion and GI evaluation. She had recent colonoscopy and upper endoscopy performed June 2023.  She was not recommended for repeat scopes.  RUQ ultrasound showed hepatic steatosis and hypoechoic region in the right lobe liver.  Due to abdominal pain on admission, further workup commenced with CT abdomen/pelvis in case of gallbladder pathology. CT was notable for multiple findings.  There was a large mass measuring 4.7 x 3 cm noted in the right lower quadrant and also large heterogenous mass superior to the cervix measuring 7.9 x 6.1 cm. Multiple dilated loops of bowel concerning for compression/obstruction were also appreciated. She also has a recent history over the past year of dysfunctional uterine bleeding and thickened endometrium noted on outpatient ultrasound and again seen on CT.  She had declined outpatient endometrial biopsy.  Due to these findings, further workup commenced with consulting surgery, oncology, and gyn-oncology. Tumor markers ordered as well.  On 9/9, she ultimately underwent ex-lap with distal small bowel resection with primary anastomosis along with excisional biopsy of pelvic, multiple abdominal wall and omental masses.  Slow to improve. Drain placed 9/20 by IR.   Interval History:   sleeping   Assessment and Plan: Colon  adenocarcinoma (HCC) - per CT there is 4.7 x 3 cm mass in RLL and mass superior to cervix 7.9 x 6.1 cm. Known thickened endometrium again seen and recent hx DUB that was being monitored outpatient.  - initial concern was for GYN malignancy; this has now been ruled out after ex-lap and pathology results peritoneal nodule needle core biopsy from 06/24/2023; endometrial biopsy also negative from 06/24/2023 -Patient underwent ex lap with distal small bowel resection with primary anastomosis, excisional biopsy of pelvic, multiple abdominal wall and omental masses on 06/30/2023 - general surgery following- CT scan on 9/18:-CT with some inflammatory changes near anastomosis, continue antibiotics  - oncology also now on board and following for further outpatient plans 3 staples removed from inferior portion of wound 9/16 for infection, additional 1 staple removed 9/18. Pack BID and PRN saturation. Shower with wound open.  -drain placed 9/20 by IR  SBO (small bowel obstruction) (HCC) - Presented with distended abdomen and decreased bowel sounds.  Initially suspected constipation contributing but after CT, concern is now for underlying obstruction which may be due to mass effect - general surgery following as well - now s/p ex-lap with small bowel resection with primary anastomosis and excisional biopsy of pelvic, multi abdominal wall, and omental masses on 06/30/2023 -clears started  Microcytic anemia - Multifactorial etiology at this point.  Overt iron deficiency on admission, newly diagnosed malignancy, and some expected blood loss from recent surgery on 06/30/2023 -Still needs DVT prophylaxis with HSQ given high risk of hypercoagulability/VTE; also needs Khorana score (currently at least 3 points, high risk) calculated prior to discharge and/or before treatment most likely  IDA (iron deficiency anemia) - on admission: iron labs: Iron 9, sat ratio 2%, TIBC 444. Ferritin 8 - s/p 4 units PRBC so far since  admission and received ferrlicit  Upper GI bleed-resolved as of 07/03/2023 - Possibly slow/chronic blood loss from underlying "chronic inactive gastritis" noted on EGD from 05/08/2023.  No H. pylori organisms were noted on immunohistochemical stain but due to pattern of inflammation, she was started on H. pylori treatment which she completed approximately 2 weeks prior to admission -Stools were melanotic in appearance but she states she has also been on oral iron daily -Hemoglobin certainly low on admission, 5.9 g/dL.   - now also s/p ex-lap with presumed some blood loss as expected - will continue to trend H/H and transfuse as necessary - no further scopes per GI at this time  Type 2 diabetes mellitus with obesity (HCC) - Last A1c 6.5% on 12/05/2022 - SSI ordered in setting of TPN use  - once TPN fully off, will d/c SSI  Essential hypertension - hold ARB in setting of above - PRN meds to be added; can make scheduled if needed    DVT prophylaxis:  heparin injection 5,000 Units Start: 07/11/23 2200 SCDs Start: 06/20/23 2059   Code Status:   Code Status: Full Code     Disposition Plan: Home Status is: Inpatient  Objective: Blood pressure 103/63, pulse 67, temperature 98.8 F (37.1 C), temperature source Oral, resp. rate 16, height 5\' 4"  (1.626 m), weight 89.9 kg, SpO2 98%.  Examination:     General: Appearance:    Obese female in no acute distress     Lungs:      respirations unlabored  Heart:    Normal heart rate. Normal rhythm. No murmurs, rubs, or gallops.   MS:   All extremities are intact.   Neurologic:   Awake, alert      Consultants:  GI - signed off General surgery Gyn-onc - signed off Oncology IR  Procedures:  06/30/23: Exploratory laparotomy, distal small bowel resection with primary anastomosis, excisional biopsy of pelvic, multiple abdominal wall and omental masses   Data Reviewed: Results for orders placed or performed during the hospital encounter of  06/20/23 (from the past 24 hour(s))  Glucose, capillary     Status: Abnormal   Collection Time: 07/11/23 12:08 PM  Result Value Ref Range   Glucose-Capillary 117 (H) 70 - 99 mg/dL  Glucose, capillary     Status: Abnormal   Collection Time: 07/11/23  3:58 PM  Result Value Ref Range   Glucose-Capillary 115 (H) 70 - 99 mg/dL  Glucose, capillary     Status: Abnormal   Collection Time: 07/11/23  6:47 PM  Result Value Ref Range   Glucose-Capillary 117 (H) 70 - 99 mg/dL  Glucose, capillary     Status: Abnormal   Collection Time: 07/11/23  8:00 PM  Result Value Ref Range   Glucose-Capillary 140 (H) 70 - 99 mg/dL  Glucose, capillary     Status: Abnormal   Collection Time: 07/11/23 11:45 PM  Result Value Ref Range   Glucose-Capillary 127 (H) 70 - 99 mg/dL  CBC with Differential/Platelet     Status: Abnormal   Collection Time: 07/12/23  4:44 AM  Result Value Ref Range   WBC 14.2 (H) 4.0 - 10.5 K/uL   RBC 3.17 (L) 3.87 - 5.11 MIL/uL   Hemoglobin 7.7 (L) 12.0 - 15.0 g/dL   HCT 16.1 (L) 09.6 - 04.5 %   MCV 87.4 80.0 -  100.0 fL   MCH 24.3 (L) 26.0 - 34.0 pg   MCHC 27.8 (L) 30.0 - 36.0 g/dL   RDW 40.9 (H) 81.1 - 91.4 %   Platelets 419 (H) 150 - 400 K/uL   nRBC 0.0 0.0 - 0.2 %   Neutrophils Relative % 84 %   Neutro Abs 12.0 (H) 1.7 - 7.7 K/uL   Lymphocytes Relative 10 %   Lymphs Abs 1.5 0.7 - 4.0 K/uL   Monocytes Relative 4 %   Monocytes Absolute 0.6 0.1 - 1.0 K/uL   Eosinophils Relative 1 %   Eosinophils Absolute 0.2 0.0 - 0.5 K/uL   Basophils Relative 0 %   Basophils Absolute 0.0 0.0 - 0.1 K/uL   Immature Granulocytes 1 %   Abs Immature Granulocytes 0.21 (H) 0.00 - 0.07 K/uL   Polychromasia PRESENT    Target Cells PRESENT    Ovalocytes PRESENT   Basic metabolic panel     Status: Abnormal   Collection Time: 07/12/23  4:44 AM  Result Value Ref Range   Sodium 136 135 - 145 mmol/L   Potassium 4.0 3.5 - 5.1 mmol/L   Chloride 102 98 - 111 mmol/L   CO2 23 22 - 32 mmol/L   Glucose,  Bld 141 (H) 70 - 99 mg/dL   BUN 8 6 - 20 mg/dL   Creatinine, Ser 7.82 0.44 - 1.00 mg/dL   Calcium 8.1 (L) 8.9 - 10.3 mg/dL   GFR, Estimated >95 >62 mL/min   Anion gap 11 5 - 15  Magnesium     Status: None   Collection Time: 07/12/23  4:44 AM  Result Value Ref Range   Magnesium 2.2 1.7 - 2.4 mg/dL  Phosphorus     Status: None   Collection Time: 07/12/23  4:44 AM  Result Value Ref Range   Phosphorus 2.6 2.5 - 4.6 mg/dL  Triglycerides     Status: None   Collection Time: 07/12/23  4:44 AM  Result Value Ref Range   Triglycerides 137 <150 mg/dL  Glucose, capillary     Status: Abnormal   Collection Time: 07/12/23  5:29 AM  Result Value Ref Range   Glucose-Capillary 146 (H) 70 - 99 mg/dL    I have reviewed pertinent nursing notes, vitals, labs, and images as necessary. I have ordered labwork to follow up on as indicated.  I have reviewed the last notes from staff over past 24 hours. I have discussed patient's care plan and test results with nursing staff, CM/SW, and other staff as appropriate.    LOS: 22 days   Joseph Art, DO [facility] 07/12/2023, 11:15 AM

## 2023-07-12 NOTE — Progress Notes (Addendum)
PHARMACY - TOTAL PARENTERAL NUTRITION CONSULT NOTE   Indication: Small bowel obstruction and ileus  Patient Measurements: Height: 5\' 4"  (162.6 cm) Weight: 89.9 kg (198 lb 3.1 oz) IBW/kg (Calculated) : 54.7 TPN AdjBW (KG): 64.5 Body mass index is 34.02 kg/m.  Assessment:  Pharmacy consulted to start TPN on 57 yo female diagnosed with small bowel obstruction. S/p exploratory laparotomy on 9/9 and now awaiting return of bowel function.   - 9/18: Resume TPN due to persistent ileus with abdominal pain, nausea, decreased po intake and early satiety.   Glucose / Insulin: pre-DM on semaglutide PTA; last A1c 6.5 in Feb 2024>>down to 5.5 - 9/20 AM glucose = 141 - CBG range (past 24 hr): 115-146 - on sSSI q6h (used 2 units in the past 24 hrs) Electrolytes: all electrolytes WNL; CorrCa= 9.78 Note: phos = 2.6 (lower end normal range).  Mag=2.2 - goal K > 4, Mag > 2, phos ~3 for ileus Renal: SCr and BUN stable WNL Hepatic:  - Last LFTS 9/20: WNL; T. Bili WNL - Albumin low  - Triglycerides trending up 92>>153>>176 (9/16) > 137 (9/21) I/O:  - No mIVF 9/20 7am to 9/21 7am - UOP: 1x unmeasured occurrence documented - LBM: 9/18 GI Imaging: - 9/1 Distal small-bowel obstruction with transition point in the right lower quadrant, likely associated with a complex mass in the right lower quadrant. - 9/12 MRI liver: persistently dilated SB loops; possible peritoneal mets; solitary 2 cm metastatic mass in liver. GI Surgeries / Procedures:  - 9/9 exploratory laparotomy with possible bowel obstruction resection/G-tube placement: Right sided pelvic mass, small bowel resection. Multiple omental and abdominal wall excisional biopsies. (+ for adenocarcinoma)   Central access: PICC placed for TPN 9/2  TPN start date: 9/2>>9/16, resume 9/18>>  Nutritional Goals: Goal TPN rate is 80 mL/hr (provides ~ 90 g of protein and 1820 kcals per day)  RD Assessment: Estimated Needs Total Energy Estimated Needs:  1700-1900 Total Protein Estimated Needs: 85-100g Total Fluid Estimated Needs: 1.9L/day  Current Nutrition:  - Back to NPO - 9/18: Restart TPN for persistent ileus  Plan: - Sodium Phosphate IV x1 (runner, outside of TPN)  At 1800: Continue TPN at goal rate of 80 ml/hr  Electrolytes in TPN:  Na to 140 mEq/L K 44 mEq/L Ca 5 mEq/L Mg 3 mEq/L Phos 7 mmol/L  Cl:Ac 1:1 Add standard MVI and trace elements to TPN Chromium on hold d/t critical shortage sSSI and cbgs q6h Monitor TPN labs on Mon/Thurs and PRN  Tacy Learn, PharmD Clinical Pharmacist 07/12/2023 4:56 PM

## 2023-07-12 NOTE — Plan of Care (Signed)

## 2023-07-12 NOTE — Progress Notes (Signed)
12 Days Post-Op   Subjective/Chief Complaint: Feeling better after IR drain   Objective: Vital signs in last 24 hours: Temp:  [98.4 F (36.9 C)-98.8 F (37.1 C)] 98.8 F (37.1 C) (09/21 0450) Pulse Rate:  [67-80] 67 (09/21 0450) Resp:  [16-28] 16 (09/21 0450) BP: (103-138)/(60-76) 103/63 (09/21 0450) SpO2:  [95 %-100 %] 98 % (09/21 0450) Weight:  [89.9 kg] 89.9 kg (09/21 0454) Last BM Date : 07/09/23  Intake/Output from previous day: 09/20 0701 - 09/21 0700 In: 2439.5 [P.O.:240; I.V.:2023.3; IV Piggyback:166.1] Out: 35 [Drains:35] Intake/Output this shift: No intake/output data recorded.  Exam: Awake and alert Abdomen soft, obese, non-distended Drain purulent  Lab Results:  Recent Labs    07/11/23 0417 07/12/23 0444  WBC 12.3* 14.2*  HGB 7.3* 7.7*  HCT 25.9* 27.7*  PLT 383 419*   BMET Recent Labs    07/11/23 0417 07/12/23 0444  NA 133* 136  K 3.7 4.0  CL 103 102  CO2 21* 23  GLUCOSE 124* 141*  BUN 7 8  CREATININE 0.65 0.65  CALCIUM 7.5* 8.1*   PT/INR Recent Labs    07/11/23 0417  LABPROT 15.2  INR 1.2   ABG No results for input(s): "PHART", "HCO3" in the last 72 hours.  Invalid input(s): "PCO2", "PO2"  Studies/Results: CT GUIDED PERITONEAL/RETROPERITONEAL FLUID DRAIN BY PERC CATH  Result Date: 07/11/2023 INDICATION: 57 year old female with history of right lower quadrant postsurgical abdominal fluid collection concerning for abscess. EXAM: CT PERC DRAIN PERITONEAL ABCESS COMPARISON:  None Available. MEDICATIONS: The patient is currently admitted to the hospital and receiving intravenous antibiotics. The antibiotics were administered within an appropriate time frame prior to the initiation of the procedure. ANESTHESIA/SEDATION: Moderate (conscious) sedation was employed during this procedure. A total of Versed 2 mg and Fentanyl 100 mcg was administered intravenously. Moderate Sedation Time: 19 minutes. The patient's level of consciousness and vital  signs were monitored continuously by radiology nursing throughout the procedure under my direct supervision. CONTRAST:  None COMPLICATIONS: None immediate. PROCEDURE: RADIATION DOSE REDUCTION: This exam was performed according to the departmental dose-optimization program which includes automated exposure control, adjustment of the mA and/or kV according to patient size and/or use of iterative reconstruction technique. Informed written consent was obtained from the patient after a discussion of the risks, benefits and alternatives to treatment. The patient was placed supine on the CT gantry and a pre procedural CT was performed re-demonstrating the known abscess/fluid collection within the right lower quadrant. The procedure was planned. A timeout was performed prior to the initiation of the procedure. The right lower quadrant was prepped and draped in the usual sterile fashion. The overlying soft tissues were anesthetized with 1% lidocaine with epinephrine. Appropriate trajectory was planned with the use of a 22 gauge spinal needle. An 18 gauge trocar needle was advanced into the abscess/fluid collection and a short Amplatz super stiff wire was coiled within the collection. Appropriate positioning was confirmed with a limited CT scan. The tract was serially dilated allowing placement of a 10 [place] all-purpose drainage catheter. Appropriate positioning was confirmed with a limited postprocedural CT scan. Approximately 50 ml of purulent fluid was aspirated. The tube was connected to a bulb suction and sutured in place. A dressing was placed. The patient tolerated the procedure well without immediate post procedural complication. IMPRESSION: Successful CT guided placement of a 10 French all purpose drain catheter into the right lower quadrant fluid collection with aspiration of approximately 50 mL of purulent fluid. Samples were sent  to the laboratory as requested by the ordering clinical team. Marliss Coots, MD  [facility] Electronically Signed   By: Marliss Coots M.D.   On: 07/11/2023 15:21    Anti-infectives: Anti-infectives (From admission, onward)    Start     Dose/Rate Route Frequency Ordered Stop   07/09/23 1600  piperacillin-tazobactam (ZOSYN) IVPB 3.375 g        3.375 g 12.5 mL/hr over 240 Minutes Intravenous Every 8 hours 07/09/23 1510     06/30/23 0830  ceFAZolin (ANCEF) IVPB 2g/100 mL premix        2 g 200 mL/hr over 30 Minutes Intravenous On call to O.R. 06/30/23 0740 06/30/23 1227       Assessment/Plan: s/p Procedure(s): EXPLORATORY LAPAROTOMY, SMALL BOWEL RESECTION, AND EXCISIONAL BIOPSIES OF MULTIPLE OMENTAL MASSES (N/A)  POD#12 s/p ex lap, SBR and biopsy of multiple sites 9/9 Dr. Fredricka Bonine    Tolerated IR drain yesterday Cultures pending Ileus improving Will start diet again Continue current care  Abigail Miyamoto MD 07/12/2023

## 2023-07-13 DIAGNOSIS — C179 Malignant neoplasm of small intestine, unspecified: Secondary | ICD-10-CM | POA: Diagnosis not present

## 2023-07-13 LAB — CBC
HCT: 27.7 % — ABNORMAL LOW (ref 36.0–46.0)
Hemoglobin: 7.8 g/dL — ABNORMAL LOW (ref 12.0–15.0)
MCH: 24.1 pg — ABNORMAL LOW (ref 26.0–34.0)
MCHC: 28.2 g/dL — ABNORMAL LOW (ref 30.0–36.0)
MCV: 85.8 fL (ref 80.0–100.0)
Platelets: 429 10*3/uL — ABNORMAL HIGH (ref 150–400)
RBC: 3.23 MIL/uL — ABNORMAL LOW (ref 3.87–5.11)
RDW: 28.1 % — ABNORMAL HIGH (ref 11.5–15.5)
WBC: 14.4 10*3/uL — ABNORMAL HIGH (ref 4.0–10.5)
nRBC: 0 % (ref 0.0–0.2)

## 2023-07-13 LAB — BASIC METABOLIC PANEL
Anion gap: 8 (ref 5–15)
BUN: 9 mg/dL (ref 6–20)
CO2: 26 mmol/L (ref 22–32)
Calcium: 8.2 mg/dL — ABNORMAL LOW (ref 8.9–10.3)
Chloride: 102 mmol/L (ref 98–111)
Creatinine, Ser: 0.67 mg/dL (ref 0.44–1.00)
GFR, Estimated: >60 mL/min (ref >60)
Glucose, Bld: 160 mg/dL — ABNORMAL HIGH (ref 70–99)
Potassium: 3.9 mmol/L (ref 3.5–5.1)
Sodium: 136 mmol/L (ref 135–145)

## 2023-07-13 LAB — AEROBIC/ANAEROBIC CULTURE W GRAM STAIN (SURGICAL/DEEP WOUND)

## 2023-07-13 LAB — GLUCOSE, CAPILLARY
Glucose-Capillary: 133 mg/dL — ABNORMAL HIGH (ref 70–99)
Glucose-Capillary: 135 mg/dL — ABNORMAL HIGH (ref 70–99)
Glucose-Capillary: 139 mg/dL — ABNORMAL HIGH (ref 70–99)
Glucose-Capillary: 187 mg/dL — ABNORMAL HIGH (ref 70–99)

## 2023-07-13 LAB — PHOSPHORUS: Phosphorus: 2.9 mg/dL (ref 2.5–4.6)

## 2023-07-13 LAB — MAGNESIUM: Magnesium: 2.1 mg/dL (ref 1.7–2.4)

## 2023-07-13 MED ORDER — TRAVASOL 10 % IV SOLN
INTRAVENOUS | Status: AC
  Filled 2023-07-13: qty 902.4

## 2023-07-13 NOTE — Progress Notes (Signed)
Progress Note    Linda [name]   WUJ:[id number]  DOB: 10/07/1966  DOA: 06/20/2023     23 PCP: Arnette Felts, [name]    Hospital Course: Ms. Schmieder is a 57 yo female with PMH anemia, HTN, pre-DM who presented with abdominal pain.  She recently underwent EGD on 05/08/2023.  She was started on H. pylori treatment after procedure.  She completed treatment course approximately 2 weeks prior to admission. She has also been on oral iron and having dark stools. Due to worsening weakness and ongoing abdominal discomfort, she presented to the ER for further evaluation. Hemoglobin was found to be 5.9 g/dL.  She was admitted for blood transfusion and GI evaluation. She had recent colonoscopy and upper endoscopy performed June 2023.  She was not recommended for repeat scopes.  RUQ ultrasound showed hepatic steatosis and hypoechoic region in the right lobe liver.  Due to abdominal pain on admission, further workup commenced with CT abdomen/pelvis in case of gallbladder pathology. CT was notable for multiple findings.  There was a large mass measuring 4.7 x 3 cm noted in the right lower quadrant and also large heterogenous mass superior to the cervix measuring 7.9 x 6.1 cm. Multiple dilated loops of bowel concerning for compression/obstruction were also appreciated. She also has a recent history over the past year of dysfunctional uterine bleeding and thickened endometrium noted on outpatient ultrasound and again seen on CT.  She had declined outpatient endometrial biopsy.  Due to these findings, further workup commenced with consulting surgery, oncology, and gyn-oncology. Tumor markers ordered as well.  On 9/9, she ultimately underwent ex-lap with distal small bowel resection with primary anastomosis along with excisional biopsy of pelvic, multiple abdominal wall and omental masses.  Slow to improve. Drain placed 9/20 by IR.   Interval History:   Feeling better Not much coming out of  drain   Assessment and Plan: Colon adenocarcinoma (HCC) - per CT there is 4.7 x 3 cm mass in RLL and mass superior to cervix 7.9 x 6.1 cm. Known thickened endometrium again seen and recent hx DUB that was being monitored outpatient.  - initial concern was for GYN malignancy; this has now been ruled out after ex-lap and pathology results peritoneal nodule needle core biopsy from 06/24/2023; endometrial biopsy also negative from 06/24/2023 -Patient underwent ex lap with distal small bowel resection with primary anastomosis, excisional biopsy of pelvic, multiple abdominal wall and omental masses on 06/30/2023 - general surgery following- CT scan on 9/18:-CT with some inflammatory changes near anastomosis, continue antibiotics  - oncology also now on board and following for further outpatient plans 3 staples removed from inferior portion of wound 9/16 for infection, additional 1 staple removed 9/18. Pack BID and PRN saturation. Shower with wound open.  -drain placed 9/20 by IR  SBO (small bowel obstruction) (HCC) - Presented with distended abdomen and decreased bowel sounds.  Initially suspected constipation contributing but after CT, concern is now for underlying obstruction which may be due to mass effect - general surgery following as well - now s/p ex-lap with small bowel resection with primary anastomosis and excisional biopsy of pelvic, multi abdominal wall, and omental masses on 06/30/2023 -clears started  Microcytic anemia - Multifactorial etiology at this point.  Overt iron deficiency on admission, newly diagnosed malignancy, and some expected blood loss from recent surgery on 06/30/2023 -Still needs DVT prophylaxis with HSQ given high risk of hypercoagulability/VTE; also needs Khorana score (currently at least 3 points, high risk) calculated prior  to discharge and/or before treatment most likely   IDA (iron deficiency anemia) - on admission: iron labs: Iron 9, sat ratio 2%, TIBC 444. Ferritin  8 - s/p 4 units PRBC so far since admission and received ferrlicit  Upper GI bleed-resolved as of 07/03/2023 - Possibly slow/chronic blood loss from underlying "chronic inactive gastritis" noted on EGD from 05/08/2023.  No H. pylori organisms were noted on immunohistochemical stain but due to pattern of inflammation, she was started on H. pylori treatment which she completed approximately 2 weeks prior to admission -Stools were melanotic in appearance but she states she has also been on oral iron daily -Hemoglobin certainly low on admission, 5.9 g/dL.   - now also s/p ex-lap with presumed some blood loss as expected - will continue to trend H/H and transfuse as necessary - no further scopes per GI at this time  Type 2 diabetes mellitus with obesity (HCC) - Last A1c 6.5% on 12/05/2022 - SSI ordered in setting of TPN use  - once TPN fully off, will d/c SSI  Essential hypertension - hold ARB in setting of above - PRN meds to be added; can make scheduled if needed    DVT prophylaxis:  heparin injection 5,000 Units Start: 07/11/23 2200 SCDs Start: 06/20/23 2059   Code Status:   Code Status: Full Code     Disposition Plan: Home Status is: Inpatient  Objective: Blood pressure 115/62, pulse 70, temperature 98.4 F (36.9 C), temperature source Oral, resp. rate 16, height 5\' 4"  (1.626 m), weight 89.9 kg, SpO2 97%.  Examination:     General: Appearance:    Obese female in no acute distress     Lungs:      respirations unlabored  Heart:    Normal heart rate. Normal rhythm. No murmurs, rubs, or gallops.   MS:   All extremities are intact.   Neurologic:   Awake, alert      Consultants:  GI - signed off General surgery Gyn-onc - signed off Oncology IR  Procedures:  06/30/23: Exploratory laparotomy, distal small bowel resection with primary anastomosis, excisional biopsy of pelvic, multiple abdominal wall and omental masses   Data Reviewed: Results for orders placed or  performed during the hospital encounter of 06/20/23 (from the past 24 hour(s))  Glucose, capillary     Status: Abnormal   Collection Time: 07/12/23 11:51 AM  Result Value Ref Range   Glucose-Capillary 164 (H) 70 - 99 mg/dL   Comment 1 Notify RN    Comment 2 Document in Chart   Glucose, capillary     Status: Abnormal   Collection Time: 07/12/23  6:04 PM  Result Value Ref Range   Glucose-Capillary 130 (H) 70 - 99 mg/dL   Comment 1 Notify RN    Comment 2 Document in Chart   Glucose, capillary     Status: Abnormal   Collection Time: 07/12/23 11:51 PM  Result Value Ref Range   Glucose-Capillary 140 (H) 70 - 99 mg/dL  Magnesium     Status: None   Collection Time: 07/13/23  4:01 AM  Result Value Ref Range   Magnesium 2.1 1.7 - 2.4 mg/dL  Phosphorus     Status: None   Collection Time: 07/13/23  4:01 AM  Result Value Ref Range   Phosphorus 2.9 2.5 - 4.6 mg/dL  CBC     Status: Abnormal   Collection Time: 07/13/23  4:01 AM  Result Value Ref Range   WBC 14.4 (H) 4.0 - 10.5 K/uL  RBC 3.23 (L) 3.87 - 5.11 MIL/uL   Hemoglobin 7.8 (L) 12.0 - 15.0 g/dL   HCT 78.4 (L) 69.6 - 29.5 %   MCV 85.8 80.0 - 100.0 fL   MCH 24.1 (L) 26.0 - 34.0 pg   MCHC 28.2 (L) 30.0 - 36.0 g/dL   RDW 28.4 (H) 13.2 - 44.0 %   Platelets 429 (H) 150 - 400 K/uL   nRBC 0.0 0.0 - 0.2 %  Basic metabolic panel     Status: Abnormal   Collection Time: 07/13/23  4:01 AM  Result Value Ref Range   Sodium 136 135 - 145 mmol/L   Potassium 3.9 3.5 - 5.1 mmol/L   Chloride 102 98 - 111 mmol/L   CO2 26 22 - 32 mmol/L   Glucose, Bld 160 (H) 70 - 99 mg/dL   BUN 9 6 - 20 mg/dL   Creatinine, Ser 1.02 0.44 - 1.00 mg/dL   Calcium 8.2 (L) 8.9 - 10.3 mg/dL   GFR, Estimated >72 >53 mL/min   Anion gap 8 5 - 15  Glucose, capillary     Status: Abnormal   Collection Time: 07/13/23  6:15 AM  Result Value Ref Range   Glucose-Capillary 187 (H) 70 - 99 mg/dL    I have reviewed pertinent nursing notes, vitals, labs, and images as  necessary. I have ordered labwork to follow up on as indicated.  I have reviewed the last notes from staff over past 24 hours. I have discussed patient's care plan and test results with nursing staff, CM/SW, and other staff as appropriate.    LOS: 23 days   Joseph Art, DO [facility] 07/13/2023, 11:37 AM

## 2023-07-13 NOTE — Plan of Care (Signed)
Pt is progressing 

## 2023-07-13 NOTE — Plan of Care (Signed)

## 2023-07-13 NOTE — TOC Progression Note (Signed)
Transition of Care [facility]) - Progression Note    Patient Details  Name: Linda [name] MRN: [id number] Date of Birth: Jun 13, 1966  Transition of Care [facility]) CM/SW Contact  Darleene Cleaver, [place] Phone Number: 07/13/2023, 7:26 PM  Clinical Narrative:     Currently no TOC needs, TOC continuing to follow.  If new TOC needs arise, please place consult.  Expected Discharge Plan: Home/Self Care    Expected Discharge Plan and Services                                               Social Determinants of Health (SDOH) Interventions SDOH Screenings   Food Insecurity: No Food Insecurity (06/20/2023)  Housing: Low Risk  (06/20/2023)  Transportation Needs: No Transportation Needs (06/20/2023)  Utilities: Not At Risk (06/20/2023)  Depression (PHQ2-9): Low Risk  (03/05/2023)  Tobacco Use: Low Risk  (06/20/2023)    Readmission Risk Interventions     No data to display

## 2023-07-13 NOTE — Progress Notes (Signed)
13 Days Post-Op   Subjective/Chief Complaint: Tolerated clear liquids without issues Having more BM's   Objective: Vital signs in last 24 hours: Temp:  [98 F (36.7 C)-98.6 F (37 C)] 98.4 F (36.9 C) (09/22 0442) Pulse Rate:  [70-77] 70 (09/22 0442) Resp:  [16-18] 16 (09/22 0442) BP: (109-130)/(62-64) 115/62 (09/22 0442) SpO2:  [97 %-100 %] 97 % (09/22 0442) Last BM Date : 07/12/23  Intake/Output from previous day: 09/21 0701 - 09/22 0700 In: 2080.5 [P.O.:120; I.V.:1803; IV Piggyback:147.4] Out: 17.5 [Drains:17.5] Intake/Output this shift: No intake/output data recorded.  Exam: Up walking  Looks good Abdomen soft, drain purulent  Lab Results:  Recent Labs    07/12/23 0444 07/13/23 0401  WBC 14.2* 14.4*  HGB 7.7* 7.8*  HCT 27.7* 27.7*  PLT 419* 429*   BMET Recent Labs    07/12/23 0444 07/13/23 0401  NA 136 136  K 4.0 3.9  CL 102 102  CO2 23 26  GLUCOSE 141* 160*  BUN 8 9  CREATININE 0.65 0.67  CALCIUM 8.1* 8.2*   PT/INR Recent Labs    07/11/23 0417  LABPROT 15.2  INR 1.2   ABG No results for input(s): "PHART", "HCO3" in the last 72 hours.  Invalid input(s): "PCO2", "PO2"  Studies/Results: CT GUIDED PERITONEAL/RETROPERITONEAL FLUID DRAIN BY PERC CATH  Result Date: 07/11/2023 INDICATION: 57 year old female with history of right lower quadrant postsurgical abdominal fluid collection concerning for abscess. EXAM: CT PERC DRAIN PERITONEAL ABCESS COMPARISON:  None Available. MEDICATIONS: The patient is currently admitted to the hospital and receiving intravenous antibiotics. The antibiotics were administered within an appropriate time frame prior to the initiation of the procedure. ANESTHESIA/SEDATION: Moderate (conscious) sedation was employed during this procedure. A total of Versed 2 mg and Fentanyl 100 mcg was administered intravenously. Moderate Sedation Time: 19 minutes. The patient's level of consciousness and vital signs were monitored  continuously by radiology nursing throughout the procedure under my direct supervision. CONTRAST:  None COMPLICATIONS: None immediate. PROCEDURE: RADIATION DOSE REDUCTION: This exam was performed according to the departmental dose-optimization program which includes automated exposure control, adjustment of the mA and/or kV according to patient size and/or use of iterative reconstruction technique. Informed written consent was obtained from the patient after a discussion of the risks, benefits and alternatives to treatment. The patient was placed supine on the CT gantry and a pre procedural CT was performed re-demonstrating the known abscess/fluid collection within the right lower quadrant. The procedure was planned. A timeout was performed prior to the initiation of the procedure. The right lower quadrant was prepped and draped in the usual sterile fashion. The overlying soft tissues were anesthetized with 1% lidocaine with epinephrine. Appropriate trajectory was planned with the use of a 22 gauge spinal needle. An 18 gauge trocar needle was advanced into the abscess/fluid collection and a short Amplatz super stiff wire was coiled within the collection. Appropriate positioning was confirmed with a limited CT scan. The tract was serially dilated allowing placement of a 10 [place] all-purpose drainage catheter. Appropriate positioning was confirmed with a limited postprocedural CT scan. Approximately 50 ml of purulent fluid was aspirated. The tube was connected to a bulb suction and sutured in place. A dressing was placed. The patient tolerated the procedure well without immediate post procedural complication. IMPRESSION: Successful CT guided placement of a 10 French all purpose drain catheter into the right lower quadrant fluid collection with aspiration of approximately 50 mL of purulent fluid. Samples were sent to the laboratory as requested  by the ordering clinical team. Marliss Coots, MD [facility] Electronically Signed   By: Marliss Coots M.D.   On: 07/11/2023 15:21    Anti-infectives: Anti-infectives (From admission, onward)    Start     Dose/Rate Route Frequency Ordered Stop   07/09/23 1600  piperacillin-tazobactam (ZOSYN) IVPB 3.375 g        3.375 g 12.5 mL/hr over 240 Minutes Intravenous Every 8 hours 07/09/23 1510     06/30/23 0830  ceFAZolin (ANCEF) IVPB 2g/100 mL premix        2 g 200 mL/hr over 30 Minutes Intravenous On call to O.R. 06/30/23 0740 06/30/23 1227       Assessment/Plan: POD#13 s/p ex lap, SBR and biopsy of multiple sites 9/9 Dr. Fredricka Bonine   -will try full liquids today given improvement -continue drains -continue antibiotics  Abigail Miyamoto MD 07/13/2023

## 2023-07-13 NOTE — Progress Notes (Addendum)
PHARMACY - TOTAL PARENTERAL NUTRITION CONSULT NOTE   Indication: Small bowel obstruction and ileus  Patient Measurements: Height: 5\' 4"  (162.6 cm) Weight: 89.9 kg (198 lb 3.1 oz) IBW/kg (Calculated) : 54.7 TPN AdjBW (KG): 64.5 Body mass index is 34.02 kg/m.  Assessment:  Pharmacy consulted to start TPN on 57 yo female diagnosed with small bowel obstruction. S/p exploratory laparotomy on 9/9 and now awaiting return of bowel function.   - 9/18: Resume TPN due to persistent ileus with abdominal pain, nausea, decreased po intake and early satiety.   Glucose / Insulin: pre-DM on semaglutide PTA; last A1c 6.5 in Feb 2024>>down to 5.5 - 9/20 AM glucose = 160 - CBG range (past 24 hr): 130-187 - on sSSI q6h (used 6 units in the past 24 hrs) Electrolytes: all electrolytes WNL; CorrCa= 9.88 Note: phos = 2.6 (lower end normal range).  Mag=2.2 - goal K > 4, Mag > 2, phos ~3 for ileus Renal: SCr and BUN stable WNL Hepatic:  - Last LFTS 9/20: WNL; T. Bili WNL - Albumin low  - Triglycerides trending up 92>>153>>176 (9/16) > 137 (9/21) I/O:  - No mIVF 9/21 7am to 9/22 7am - UOP: 2x unmeasured occurrence documented - LBM: 9/21  (3x unmeasured occurrence) GI Imaging: - 9/1 Distal small-bowel obstruction with transition point in the right lower quadrant, likely associated with a complex mass in the right lower quadrant. - 9/12 MRI liver: persistently dilated SB loops; possible peritoneal mets; solitary 2 cm metastatic mass in liver. GI Surgeries / Procedures:  - 9/9 exploratory laparotomy with possible bowel obstruction resection/G-tube placement: Right sided pelvic mass, small bowel resection. Multiple omental and abdominal wall excisional biopsies. (+ for adenocarcinoma)   Central access: PICC placed for TPN 9/2  TPN start date: 9/2>>9/16, resume 9/18>>  Nutritional Goals: Goal TPN rate is 80 mL/hr (provides ~ 90 g of protein and 1820 kcals per day)  RD Assessment: Estimated Needs Total  Energy Estimated Needs: 1700-1900 Total Protein Estimated Needs: 85-100g Total Fluid Estimated Needs: 1.9L/day  Current Nutrition:  - Back to NPO - 9/18: Restart TPN for persistent ileus - 9/21: Clear liquid diet started - 9/22: Full liquid diet started  Plan: At 1800: Continue TPN at goal rate of 80 ml/hr  Electrolytes in TPN:  No changes Na to 140 mEq/L K 44 mEq/L Ca 5 mEq/L Mg 3 mEq/L Phos 7 mmol/L  Cl:Ac 1:1 Add standard MVI and trace elements to TPN Chromium on hold d/t critical shortage sSSI and cbgs q6h Monitor TPN labs on Mon/Thurs and PRN F/up intake amount and toleration of full liquid diet that was started today  Tacy Learn, PharmD Clinical Pharmacist 07/13/2023 8:21 AM

## 2023-07-14 ENCOUNTER — Inpatient Hospital Stay: Payer: BC Managed Care – PPO | Admitting: Hematology

## 2023-07-14 ENCOUNTER — Inpatient Hospital Stay: Payer: BC Managed Care – PPO

## 2023-07-14 DIAGNOSIS — C179 Malignant neoplasm of small intestine, unspecified: Secondary | ICD-10-CM | POA: Diagnosis not present

## 2023-07-14 LAB — GLUCOSE, CAPILLARY
Glucose-Capillary: 112 mg/dL — ABNORMAL HIGH (ref 70–99)
Glucose-Capillary: 117 mg/dL — ABNORMAL HIGH (ref 70–99)
Glucose-Capillary: 129 mg/dL — ABNORMAL HIGH (ref 70–99)
Glucose-Capillary: 142 mg/dL — ABNORMAL HIGH (ref 70–99)
Glucose-Capillary: 152 mg/dL — ABNORMAL HIGH (ref 70–99)

## 2023-07-14 LAB — COMPREHENSIVE METABOLIC PANEL WITH GFR
ALT: 69 U/L — ABNORMAL HIGH (ref 0–44)
AST: 74 U/L — ABNORMAL HIGH (ref 15–41)
Albumin: 2.1 g/dL — ABNORMAL LOW (ref 3.5–5.0)
Alkaline Phosphatase: 222 U/L — ABNORMAL HIGH (ref 38–126)
Anion gap: 9 (ref 5–15)
BUN: 9 mg/dL (ref 6–20)
CO2: 24 mmol/L (ref 22–32)
Calcium: 8.3 mg/dL — ABNORMAL LOW (ref 8.9–10.3)
Chloride: 100 mmol/L (ref 98–111)
Creatinine, Ser: 0.64 mg/dL (ref 0.44–1.00)
GFR, Estimated: >60 mL/min
Glucose, Bld: 147 mg/dL — ABNORMAL HIGH (ref 70–99)
Potassium: 3.9 mmol/L (ref 3.5–5.1)
Sodium: 133 mmol/L — ABNORMAL LOW (ref 135–145)
Total Bilirubin: 0.5 mg/dL (ref 0.3–1.2)
Total Protein: 7.5 g/dL (ref 6.5–8.1)

## 2023-07-14 LAB — CBC
HCT: 27.8 % — ABNORMAL LOW (ref 36.0–46.0)
Hemoglobin: 8 g/dL — ABNORMAL LOW (ref 12.0–15.0)
MCH: 24.6 pg — ABNORMAL LOW (ref 26.0–34.0)
MCHC: 28.8 g/dL — ABNORMAL LOW (ref 30.0–36.0)
MCV: 85.5 fL (ref 80.0–100.0)
Platelets: 466 10*3/uL — ABNORMAL HIGH (ref 150–400)
RBC: 3.25 MIL/uL — ABNORMAL LOW (ref 3.87–5.11)
RDW: 27.9 % — ABNORMAL HIGH (ref 11.5–15.5)
WBC: 12.8 10*3/uL — ABNORMAL HIGH (ref 4.0–10.5)
nRBC: 0.2 % (ref 0.0–0.2)

## 2023-07-14 LAB — MAGNESIUM: Magnesium: 2.1 mg/dL (ref 1.7–2.4)

## 2023-07-14 LAB — PHOSPHORUS: Phosphorus: 3 mg/dL (ref 2.5–4.6)

## 2023-07-14 LAB — TRIGLYCERIDES: Triglycerides: 270 mg/dL — ABNORMAL HIGH

## 2023-07-14 MED ORDER — TRAVASOL 10 % IV SOLN
INTRAVENOUS | Status: AC
  Filled 2023-07-14: qty 451.2

## 2023-07-14 MED ORDER — KATE FARMS STANDARD 1.4 PO LIQD
325.0000 mL | Freq: Every day | ORAL | Status: DC
  Administered 2023-07-14 – 2023-07-16 (×2): 325 mL via ORAL
  Filled 2023-07-14 (×3): qty 325

## 2023-07-14 MED ORDER — PROSOURCE PLUS PO LIQD
30.0000 mL | Freq: Three times a day (TID) | ORAL | Status: DC
  Administered 2023-07-14 – 2023-07-16 (×7): 30 mL via ORAL
  Filled 2023-07-14 (×7): qty 30

## 2023-07-14 NOTE — Progress Notes (Signed)
PHARMACY - TOTAL PARENTERAL NUTRITION CONSULT NOTE   Indication: Small bowel obstruction and ileus  Patient Measurements: Height: 5\' 4"  (162.6 cm) Weight: 82.2 kg (181 lb 3.5 oz) IBW/kg (Calculated) : 54.7 TPN AdjBW (KG): 64.5 Body mass index is 31.11 kg/m.  Assessment:  Pharmacy consulted to start TPN on 57 yo female diagnosed with small bowel obstruction. S/p exploratory laparotomy on 9/9 and now awaiting return of bowel function.   Pt was previously on TPN from 9/2 > 9/16. TPN was resumed on 9/18 due to persistent ileus with abdominal pain, nausea, decreased PO intake and early satiety.   Glucose / Insulin: T2DM; A1c 6.5 in Feb 2024>>down to 5.5 -BG goal: <180; range: 133-147. 2 units SSI/24 hrs Electrolytes: Na (133) slightly low. All other lytes WNL, including CorrCa (9.8) Renal: SCr and BUN stable WNL Hepatic: LFTs now ~1.5x ULN, Alk Phos/TG now elevated. Alb remains low. I/O: Strict I/O not measured. No mIVF -UOP: 4x unmeasured, drain: 6 mL, LBM 9/21 GI Imaging: - 9/1 Distal small-bowel obstruction with transition point in the right lower quadrant, likely associated with a complex mass in the right lower quadrant. - 9/12 MRI liver: persistently dilated SB loops; possible peritoneal mets; solitary 2 cm metastatic mass in liver. -9/20 CT Abd: 5.5 cm fluid collection, possibly reflecting post-surgical abscess GI Surgeries / Procedures:  - 9/9 exploratory laparotomy with possible bowel obstruction resection/G-tube placement: Right sided pelvic mass, small bowel resection. Multiple omental and abdominal wall excisional biopsies. (+ for adenocarcinoma)  -9/20: CT guided abdominal drain placement by IR  Central access: Double lumen PICC placed 9/2  TPN start date: 9/2>>9/16, resumed 9/18>>  Nutritional Goals: Goal TPN rate is 80 mL/hr (provides ~ 90 g of protein and 1820 kcals per day)  RD Assessment: Estimated Needs Total Energy Estimated Needs: 1700-1900 Total Protein  Estimated Needs: 85-100g Total Fluid Estimated Needs: 1.9L/day  Current Nutrition:  Diet: Soft diet TPN  Plan: Received orders from [facility] to start weaning TPN today.  At 1800: Reduce TPN to half of goal rate of 40 mL/hr  Will provide 45 g protein, 910 kcal Electrolytes in TPN: Increase Na, K Na 150 mEq/L K 50 mEq/L Ca 5 mEq/L Mg 3 mEq/L Phos 7 mmol/L  Cl:Ac 1:1 Add standard MVI and trace elements to TPN Chromium on hold d/t critical shortage Continue sSSI and cbgs q6h Monitor TPN labs on Monday and Thursday.  Follow ability to tolerate advancing diet and stop TPN  Cindi Carbon, PharmD 07/14/23 8:35 AM

## 2023-07-14 NOTE — Progress Notes (Signed)
Nutrition Follow-up  INTERVENTION:   -TPN management per Pharmacy  -Increased Prosource Plus PO TID, each provides 100 kcals and 15g protein  -[employer] 1.4 PO daily, each provides 455 kcals and 20g protein  NUTRITION DIAGNOSIS:   Inadequate oral intake related to chronic illness as evidenced by per patient/family report.  Ongoing.  GOAL:   Patient will meet greater than or equal to 90% of their needs  Meeting  MONITOR:   PO intake, Supplement acceptance, Labs, Weight trends, I & O's  ASSESSMENT:   57 yo female with PMH anemia, HTN, pre-DM who presented with abdominal pain.  She recently underwent EGD on 05/08/2023.  She was started on H. pylori treatment after procedure.  She completed treatment course approximately 2 weeks prior to admission.  She has also been on oral iron and having dark stools. She was admitted for blood transfusion and GI evaluation.  8/30: admitted, NPO-> CLD 8/31: NPO -> regular diet 9/1: Heart healthy diet -> NPO->CLD 9/2: TPN initiated, NPO 9/9: s/p Exploratory laparotomy, distal small bowel resection with primary anastomosis, excisional biopsy of pelvic, multiple abdominal wall and omental masses 9/12: CLD 9/13: FLD 9/15: Regular diet 9/18: TPN resumed, NPO 9/21: CLD 9/22: FLD  Patient now on soft diet. States she is going to try chicken salad with mashed potatoes for lunch today. She is taking and tolerating Prosource supplements, okay with increasing to TID. She states she doesn't tolerate milk at this point so was willing to try [facility] which is plant based.  TPN to decrease to 40 ml/hr tonight.   Medications: Colace, Miralax  Labs reviewed: CBGs: 117-187 Low Na TG 270   Diet Order:   Diet Order             DIET SOFT Room service appropriate? Yes; Fluid consistency: Thin  Diet effective now                   EDUCATION NEEDS:   No education needs have been identified at this time  Skin:  Skin Assessment: Skin  Integrity Issues: Skin Integrity Issues:: Incisions Incisions: 9/9 abdomen  Last BM:  9/23  Height:   Ht Readings from Last 1 Encounters:  06/20/23 5\' 4"  (1.626 m)    Weight:   Wt Readings from Last 1 Encounters:  07/14/23 82.2 kg    BMI:  Body mass index is 31.11 kg/m.  Estimated Nutritional Needs:   Kcal:  1700-1900  Protein:  85-100g  Fluid:  1.9L/day   Tilda Franco, MS, RD, LDN Inpatient Clinical Dietitian Contact information available via Amion

## 2023-07-14 NOTE — Progress Notes (Signed)
Linda [name]   DOB:06-27-1966   WU#:[id number]   GNF#:[id number]  Med/onc follow up   Subjective: Pt had abdominal drain placed for fluid collection by IR on 9/20. She otherwise doing well, on soft diet, has bowel movement.  She is able to ambulate.   Objective:  Vitals:   07/14/23 0603 07/14/23 0959  BP: (!) 150/72 129/74  Pulse: 69 68  Resp: 18 20  Temp: 98.8 F (37.1 C) 98.9 F (37.2 C)  SpO2: 100% 98%    Body mass index is 31.11 kg/m.  Intake/Output Summary (Last 24 hours) at 07/14/2023 1744 Last data filed at 07/14/2023 0555 Gross per 24 hour  Intake 843.34 ml  Output 6 ml  Net 837.34 ml     Sclerae unicteric  Oropharynx clear  (+) Drain in the right side of abdomen, with minimal pinkish fluids in the bulb   MSK no focal spinal tenderness, no peripheral edema  Neuro nonfocal    CBG (last 3)  Recent Labs    07/14/23 0605 07/14/23 1214 07/14/23 1334  GLUCAP 142* 152* 117*     Labs:    Urine Studies No results for input(s): "UHGB", "CRYS" in the last 72 hours.  Invalid input(s): "UACOL", "UAPR", "USPG", "UPH", "UTP", "UGL", "UKET", "UBIL", "UNIT", "UROB", "ULEU", "UEPI", "UWBC", "URBC", "UBAC", "CAST", "UCOM", "BILUA"  Basic Metabolic Panel: Recent Labs  Lab 07/10/23 0421 07/11/23 0417 07/12/23 0444 07/13/23 0401 07/14/23 0322  NA 133* 133* 136 136 133*  K 3.7 3.7 4.0 3.9 3.9  CL 101 103 102 102 100  CO2 25 21* 23 26 24   GLUCOSE 115* 124* 141* 160* 147*  BUN 7 7 8 9 9   CREATININE 0.75 0.65 0.65 0.67 0.64  CALCIUM 7.8* 7.5* 8.1* 8.2* 8.3*  MG 2.2  --  2.2 2.1 2.1  PHOS 3.6  --  2.6 2.9 3.0   GFR Estimated Creatinine Clearance: 82.4 mL/min (by C-G formula based on SCr of 0.64 mg/dL). Liver Function Tests: Recent Labs  Lab 07/10/23 0421 07/14/23 0322  AST 41 74*  ALT 29 69*  ALKPHOS 125 222*  BILITOT 0.4 0.5  PROT 6.5 7.5  ALBUMIN 1.9* 2.1*   No results for input(s): "LIPASE", "AMYLASE" in the last 168 hours. No results for  input(s): "AMMONIA" in the last 168 hours. Coagulation profile Recent Labs  Lab 07/11/23 0417  INR 1.2    CBC: Recent Labs  Lab 07/08/23 0321 07/09/23 0328 07/10/23 0421 07/11/23 0417 07/12/23 0444 07/13/23 0401 07/14/23 0322  WBC 10.9* 18.6* 18.0* 12.3* 14.2* 14.4* 12.8*  NEUTROABS 8.0* 15.2* 15.5* 9.9* 12.0*  --   --   HGB 7.6* 8.9* 7.7* 7.3* 7.7* 7.8* 8.0*  HCT 26.1* 30.2* 26.4* 25.9* 27.7* 27.7* 27.8*  MCV 83.4 84.4 85.7 86.3 87.4 85.8 85.5  PLT 441* 502* 411* 383 419* 429* 466*   Cardiac Enzymes: No results for input(s): "CKTOTAL", "CKMB", "CKMBINDEX", "TROPONINI" in the last 168 hours. BNP: Invalid input(s): "POCBNP" CBG: Recent Labs  Lab 07/13/23 1831 07/13/23 2347 07/14/23 0605 07/14/23 1214 07/14/23 1334  GLUCAP 139* 135* 142* 152* 117*   D-Dimer No results for input(s): "DDIMER" in the last 72 hours. Hgb A1c No results for input(s): "HGBA1C" in the last 72 hours. Lipid Profile Recent Labs    07/12/23 0444 07/14/23 0323  TRIG 137 270*   Thyroid function studies No results for input(s): "TSH", "T4TOTAL", "T3FREE", "THYROIDAB" in the last 72 hours.  Invalid input(s): "FREET3" Anemia work up No results for input(s): "VITAMINB12", "  FOLATE", "FERRITIN", "TIBC", "IRON", "RETICCTPCT" in the last 72 hours.  Microbiology Recent Results (from the past 240 hour(s))  Aerobic/Anaerobic Culture w Gram Stain (surgical/deep wound)     Status: None   Collection Time: 07/11/23 10:48 AM   Specimen: Abdomen; Abscess  Result Value Ref Range Status   Specimen Description   Final    ABDOMEN ABSCESS Performed at [facility], [address] [place], [place] [address]    Special Requests   Final    NONE Performed at [facility], [address], [place], [place] [address]    Gram Stain   Final    ABUNDANT WBC PRESENT, PREDOMINANTLY PMN MODERATE GRAM POSITIVE COCCI IN PAIRS MODERATE GRAM POSITIVE RODS FEW GRAM NEGATIVE  RODS    Culture   Final    MODERATE ESCHERICHIA COLI MODERATE LACTOBACILLUS SPECIES Standardized susceptibility testing for this organism is not available. MODERATE BACTEROIDES SPECIES NOT FRAGILIS BETA LACTAMASE POSITIVE Performed at [facility], [address] [place], [place] [address]    Report Status 07/13/2023 FINAL  Final   Organism ID, Bacteria ESCHERICHIA COLI  Final      Susceptibility   Escherichia coli - MIC*    AMPICILLIN <=2 SENSITIVE Sensitive     CEFEPIME <=0.12 SENSITIVE Sensitive     CEFTAZIDIME <=1 SENSITIVE Sensitive     CEFTRIAXONE <=0.25 SENSITIVE Sensitive     CIPROFLOXACIN <=0.25 SENSITIVE Sensitive     GENTAMICIN <=1 SENSITIVE Sensitive     IMIPENEM <=0.25 SENSITIVE Sensitive     TRIMETH/SULFA <=20 SENSITIVE Sensitive     AMPICILLIN/SULBACTAM <=2 SENSITIVE Sensitive     PIP/TAZO <=4 SENSITIVE Sensitive     * MODERATE ESCHERICHIA COLI      Studies:  No results found.  Assessment: 57 y.o. female with past medical history of hypertension and prediabetes, obesity, presented with intermittent abdominal pain and nausea for the past one year.    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 -She has had a port placed -I will follow-up after her discharge, and to start chemotherapy when she recovers well, and off antibiotics.  Regimen to be determined based on her performance status, but likely FOLFOX  -pt will call me when she is ready to be discharged from hospital.  I have canceled her chemo class for this Wednesday.   Malachy Mood, MD 07/14/2023

## 2023-07-14 NOTE — Progress Notes (Signed)
Progress Note  14 Days Post-Op  Subjective: Pt reports slightly increased pain in RLQ but drain output remains similar. Tolerating FLD and having bowel function. Would like to try something more solid this AM.  Objective: Vital signs in last 24 hours: Temp:  [98.2 F (36.8 C)-99 F (37.2 C)] 98.9 F (37.2 C) (09/23 0959) Pulse Rate:  [68-73] 68 (09/23 0959) Resp:  [15-20] 20 (09/23 0959) BP: (102-150)/(53-74) 129/74 (09/23 0959) SpO2:  [98 %-100 %] 98 % (09/23 0959) Weight:  [82.2 kg] 82.2 kg (09/23 0500) Last BM Date : 07/12/23  Intake/Output from previous day: 09/22 0701 - 09/23 0700 In: 3224.3 [P.O.:540; I.V.:2470.7; IV Piggyback:208.6] Out: 6 [Drains:6] Intake/Output this shift: No intake/output data recorded.  PE: General: pleasant, WD, obese female who is laying in bed in NAD Heart: regular, rate, and rhythm.  Lungs:  Respiratory effort nonlabored Abd: soft, appropriately ttp, mild distention, incision with staples superiorly and healthy granulation inferiorly, drain with seropurulent fluid Psych: A&Ox3 with an appropriate affect.    Lab Results:  Recent Labs    07/13/23 0401 07/14/23 0322  WBC 14.4* 12.8*  HGB 7.8* 8.0*  HCT 27.7* 27.8*  PLT 429* 466*   BMET Recent Labs    07/13/23 0401 07/14/23 0322  NA 136 133*  K 3.9 3.9  CL 102 100  CO2 26 24  GLUCOSE 160* 147*  BUN 9 9  CREATININE 0.67 0.64  CALCIUM 8.2* 8.3*   PT/INR No results for input(s): "LABPROT", "INR" in the last 72 hours. CMP     Component Value Date/Time   NA 133 (L) 07/14/2023 0322   NA 141 05/16/2022 1044   K 3.9 07/14/2023 0322   CL 100 07/14/2023 0322   CO2 24 07/14/2023 0322   GLUCOSE 147 (H) 07/14/2023 0322   BUN 9 07/14/2023 0322   BUN 9 05/16/2022 1044   CREATININE 0.64 07/14/2023 0322   CALCIUM 8.3 (L) 07/14/2023 0322   PROT 7.5 07/14/2023 0322   PROT 7.8 05/16/2022 1044   ALBUMIN 2.1 (L) 07/14/2023 0322   ALBUMIN 4.4 05/16/2022 1044   AST 74 (H)  07/14/2023 0322   ALT 69 (H) 07/14/2023 0322   ALKPHOS 222 (H) 07/14/2023 0322   BILITOT 0.5 07/14/2023 0322   BILITOT <0.2 05/16/2022 1044   GFRNONAA >60 07/14/2023 0322   Lipase     Component Value Date/Time   LIPASE 35 06/20/2023 1436       Studies/Results: No results found.  Anti-infectives: Anti-infectives (From admission, onward)    Start     Dose/Rate Route Frequency Ordered Stop   07/09/23 1600  piperacillin-tazobactam (ZOSYN) IVPB 3.375 g        3.375 g 12.5 mL/hr over 240 Minutes Intravenous Every 8 hours 07/09/23 1510     06/30/23 0830  ceFAZolin (ANCEF) IVPB 2g/100 mL premix        2 g 200 mL/hr over 30 Minutes Intravenous On call to O.R. 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 Mosetta Putt is following Port placed 9/17   POD14 s/p ex-lap, SBR and biopsy of multiple sites 9/10 Dr. Fredricka Bonine   - incision clean - ok to change once daily at  this point, can remove remaining staples prior to DC - tolerating FLD and having some bowel function - ok to advance to soft diet  - drain placed in RLQ abscess 9/20 - pan sensitive E.coli  - start weaning TPN  FEN: soft diet, wean TPN VTE: SQH ID: Zosyn 9/18>>   LOS: 24 days     Juliet Rude, [facility] 07/14/2023, 10:59 AM Please see Amion for pager number during day hours 7:00am-4:30pm

## 2023-07-14 NOTE — Progress Notes (Signed)
Referring Physician(s): Kinsinger,L  Supervising Physician: Malachy Moan  Patient Status:  [facility] - In-pt  Chief Complaint: Abdominal fluid collection   Subjective: Patient doing okay today.  Denies worsening abdominal pain, nausea, vomiting   Allergies: Patient has no known allergies.  Medications: Prior to Admission medications   Medication Sig Start Date End Date Taking? Authorizing Provider  Ferric Maltol (ACCRUFER) 30 MG CAPS Take 1 capsule (30 mg total) by mouth daily. 03/05/23  Yes Arnette Felts, [name]  linaclotide [facility]) 290 MCG CAPS capsule Take 1 capsule (290 mcg total) by mouth 20 minutes before breakfast 05/09/23  Yes   Semaglutide (RYBELSUS) 7 MG TABS Take 1 tablet (7 mg total) by mouth daily. 01/13/23  Yes Arnette Felts, [name]  valsartan (DIOVAN) 160 MG tablet Take 1 tablet (160 mg total) by mouth daily. 10/03/22 10/03/23 Yes Arnette Felts, [name]  amoxicillin (AMOXIL) 500 MG capsule Take 2 capsules (1,000 mg total) by mouth 2 (two) times daily for 14 days Patient not taking: Reported on 06/20/2023 05/19/23     clarithromycin (BIAXIN) 500 MG tablet Take 1 tablet (500 mg total) by mouth every 12 (twelve) hours for 14 days Patient not taking: Reported on 06/20/2023 05/19/23     ferrous sulfate 325 (65 FE) MG tablet Take 1 tablet (325 mg total) by mouth daily. Patient not taking: Reported on 03/05/2023 02/22/22   Dione Booze, MD  fluticasone [facility]) 50 MCG/ACT nasal spray Place 2 sprays into both nostrils daily. Patient not taking: Reported on 03/05/2023 07/17/22   Henderly, Britni A, PA-C  lansoprazole (PREVACID) 30 MG capsule Take 1 capsule (30 mg total) by mouth 2 (two) times daily for 14 days Patient not taking: Reported on 06/20/2023 05/19/23     Vitamin D, Ergocalciferol, (DRISDOL) 1.25 MG (50000 UNIT) CAPS capsule Take 1 capsule (50,000 Units total) by mouth 2 (two) times a week. Patient not taking: Reported on 06/20/2023 01/23/23   Arnette Felts, [name]     Vital  Signs: BP 129/74 (BP Location: Left Arm)   Pulse 68   Temp 98.9 F (37.2 C)   Resp 20   Ht 5\' 4"  (1.626 m)   Wt 181 lb 3.5 oz (82.2 kg)   SpO2 98%   BMI 31.11 kg/m   Physical Exam awake, alert.  Right lower quadrant drain intact, insertion site okay, not significantly tender, output about 10 cc serosanguineous fluid, drain flushed earlier without difficulty  Imaging: CT GUIDED PERITONEAL/RETROPERITONEAL FLUID DRAIN BY PERC CATH  Result Date: 07/11/2023 INDICATION: 57 year old female with history of right lower quadrant postsurgical abdominal fluid collection concerning for abscess. EXAM: CT PERC DRAIN PERITONEAL ABCESS COMPARISON:  None Available. MEDICATIONS: The patient is currently admitted to the hospital and receiving intravenous antibiotics. The antibiotics were administered within an appropriate time frame prior to the initiation of the procedure. ANESTHESIA/SEDATION: Moderate (conscious) sedation was employed during this procedure. A total of Versed 2 mg and Fentanyl 100 mcg was administered intravenously. Moderate Sedation Time: 19 minutes. The patient's level of consciousness and vital signs were monitored continuously by radiology nursing throughout the procedure under my direct supervision. CONTRAST:  None COMPLICATIONS: None immediate. PROCEDURE: RADIATION DOSE REDUCTION: This exam was performed according to the departmental dose-optimization program which includes automated exposure control, adjustment of the mA and/or kV according to patient size and/or use of iterative reconstruction technique. Informed written consent was obtained from the patient after a discussion of the risks, benefits and alternatives to treatment. The patient was placed supine on the  CT gantry and a pre procedural CT was performed re-demonstrating the known abscess/fluid collection within the right lower quadrant. The procedure was planned. A timeout was performed prior to the initiation of the procedure. The  right lower quadrant was prepped and draped in the usual sterile fashion. The overlying soft tissues were anesthetized with 1% lidocaine with epinephrine. Appropriate trajectory was planned with the use of a 22 gauge spinal needle. An 18 gauge trocar needle was advanced into the abscess/fluid collection and a short Amplatz super stiff wire was coiled within the collection. Appropriate positioning was confirmed with a limited CT scan. The tract was serially dilated allowing placement of a 10 [place] all-purpose drainage catheter. Appropriate positioning was confirmed with a limited postprocedural CT scan. Approximately 50 ml of purulent fluid was aspirated. The tube was connected to a bulb suction and sutured in place. A dressing was placed. The patient tolerated the procedure well without immediate post procedural complication. IMPRESSION: Successful CT guided placement of a 10 French all purpose drain catheter into the right lower quadrant fluid collection with aspiration of approximately 50 mL of purulent fluid. Samples were sent to the laboratory as requested by the ordering clinical team. Marliss Coots, MD [facility] Electronically Signed   By: Marliss Coots M.D.   On: 07/11/2023 15:21    Labs:  CBC: Recent Labs    07/11/23 0417 07/12/23 0444 07/13/23 0401 07/14/23 0322  WBC 12.3* 14.2* 14.4* 12.8*  HGB 7.3* 7.7* 7.8* 8.0*  HCT 25.9* 27.7* 27.7* 27.8*  PLT 383 419* 429* 466*    COAGS: Recent Labs    06/20/23 1436 06/24/23 0942 07/11/23 0417  INR 1.1 1.2 1.2    BMP: Recent Labs    07/11/23 0417 07/12/23 0444 07/13/23 0401 07/14/23 0322  NA 133* 136 136 133*  K 3.7 4.0 3.9 3.9  CL 103 102 102 100  CO2 21* 23 26 24   GLUCOSE 124* 141* 160* 147*  BUN 7 8 9 9   CALCIUM 7.5* 8.1* 8.2* 8.3*  CREATININE 0.65 0.65 0.67 0.64  GFRNONAA >60 >60 >60 >60    LIVER FUNCTION TESTS: Recent Labs    07/03/23 0354 07/07/23 0454  07/10/23 0421 07/14/23 0322  BILITOT 0.9 0.6 0.4 0.5  AST 13* 20 41 74*  ALT 12 15 29  69*  ALKPHOS 79 137* 125 222*  PROT 6.4* 7.3 6.5 7.5  ALBUMIN 2.4* 2.4* 1.9* 2.1*    Assessment and Plan: 57 year old female with PMH significant for anemia, hypertension,  pre-diabetes and newly diagnosed colon adenocarcinoma. Patient has been admitted to [facility] since 06/20/23. During her stay, she underwent ex-lap with distal small bowel resection and excisional biopsy of pelvic, abdominal wall, and omental masses c/w with colon adenocarcinoma;  Status post drainage of right lower quadrant abdominal fluid collection on 9/20 (10 fr to JP); afebrile, WBC 12.8 down from 14.4, hemoglobin 8 up from 7.8, creatinine normal, fluid cultures growing E. coli, lactobacillus and Bacteroides; continue drain irrigation 8 hours, close output monitoring, lab checks; obtain follow-up CT once output minimal over 3-4 day span or if WBC increases.  Electronically Signed: D. Jeananne Rama, PA-C 07/14/2023, 1:41 PM   I spent a total of 15 Minutes at the the patient's bedside AND on the patient's hospital floor or unit, greater than 50% of which was counseling/coordinating care for right lower abdominal abscess drain    Patient ID: Linda [name], female   DOB: 1966-06-28, 57 y.o.   MRN: [id number]

## 2023-07-14 NOTE — Plan of Care (Signed)

## 2023-07-14 NOTE — Progress Notes (Signed)
Progress Note    Linda [name]   ZOX:[id number]  DOB: May 27, 1966  DOA: 06/20/2023     24 PCP: Arnette Felts, [name]    Hospital Course: Linda [name] is a 57 yo female with PMH anemia, HTN, pre-DM who presented with abdominal pain.  She recently underwent EGD on 05/08/2023.  She was started on H. pylori treatment after procedure.  She completed treatment course approximately 2 weeks prior to admission. She has also been on oral iron and having dark stools. Due to worsening weakness and ongoing abdominal discomfort, she presented to the ER for further evaluation. Hemoglobin was found to be 5.9 g/dL.  She was admitted for blood transfusion and GI evaluation. She had recent colonoscopy and upper endoscopy performed June 2023.  She was not recommended for repeat scopes.  RUQ ultrasound showed hepatic steatosis and hypoechoic region in the right lobe liver.  Due to abdominal pain on admission, further workup commenced with CT abdomen/pelvis in case of gallbladder pathology. CT was notable for multiple findings.  There was a large mass measuring 4.7 x 3 cm noted in the right lower quadrant and also large heterogenous mass superior to the cervix measuring 7.9 x 6.1 cm. Multiple dilated loops of bowel concerning for compression/obstruction were also appreciated. She also has a recent history over the past year of dysfunctional uterine bleeding and thickened endometrium noted on outpatient ultrasound and again seen on CT.  She had declined outpatient endometrial biopsy.  Due to these findings, further workup commenced with consulting surgery, oncology, and gyn-oncology. Tumor markers ordered as well.  On 9/9, she ultimately underwent ex-lap with distal small bowel resection with primary anastomosis along with excisional biopsy of pelvic, multiple abdominal wall and omental masses.  Slow to improve. Drain placed 9/20 by IR-- growing e coli.  Also developed an ileus that has since resolved.   Interval  History:   Having some pain in her LLQ  Assessment and Plan: Colon adenocarcinoma (HCC) - per CT there is 4.7 x 3 cm mass in RLL and mass superior to cervix 7.9 x 6.1 cm. Known thickened endometrium again seen and recent hx DUB that was being monitored outpatient.  - initial concern was for GYN malignancy; this has now been ruled out after ex-lap and pathology results peritoneal nodule needle core biopsy from 06/24/2023; endometrial biopsy also negative from 06/24/2023 -Patient underwent ex lap with distal small bowel resection with primary anastomosis, excisional biopsy of pelvic, multiple abdominal wall and omental masses on 06/30/2023 - general surgery following- CT scan on 9/18:-CT with some inflammatory changes near anastomosis, continue antibiotics  - oncology also now on board and following for further outpatient plans 3 staples removed from inferior portion of wound 9/16 for infection, additional 1 staple removed 9/18. Pack BID and PRN saturation. Shower with wound open.  -drain placed 9/20 by IR- growing e coli  SBO (small bowel obstruction) (HCC) - Presented with distended abdomen and decreased bowel sounds.  Initially suspected constipation contributing but after CT, concern is now for underlying obstruction which may be due to mass effect - general surgery following as well - now s/p ex-lap with small bowel resection with primary anastomosis and excisional biopsy of pelvic, multi abdominal wall, and omental masses on 06/30/2023 -diet advanced to soft on 9/23  Microcytic anemia - Multifactorial etiology at this point.  Overt iron deficiency on admission, newly diagnosed malignancy, and some expected blood loss from recent surgery on 06/30/2023 -Still needs DVT prophylaxis with HSQ given high  risk of hypercoagulability/VTE; also needs Khorana score (currently at least 3 points, high risk) calculated prior to discharge and/or before treatment most likely   IDA (iron deficiency anemia) - on  admission: iron labs: Iron 9, sat ratio 2%, TIBC 444. Ferritin 8 - s/p 4 units PRBC so far since admission and received ferrlicit  Upper GI bleed-resolved as of 07/03/2023 - Possibly slow/chronic blood loss from underlying "chronic inactive gastritis" noted on EGD from 05/08/2023.  No H. pylori organisms were noted on immunohistochemical stain but due to pattern of inflammation, she was started on H. pylori treatment which she completed approximately 2 weeks prior to admission -Stools were melanotic in appearance but she states she has also been on oral iron daily -Hemoglobin certainly low on admission, 5.9 g/dL.   - now also s/p ex-lap with presumed some blood loss as expected - will continue to trend H/H and transfuse as necessary - no further scopes per GI at this time  Type 2 diabetes mellitus with obesity (HCC) - Last A1c 6.5% on 12/05/2022 - SSI ordered in setting of TPN use  - once TPN fully off, will d/c SSI  Essential hypertension - hold ARB in setting of above - PRN meds to be added; can make scheduled if needed    DVT prophylaxis:  heparin injection 5,000 Units Start: 07/11/23 2200 SCDs Start: 06/20/23 2059   Code Status:   Code Status: Full Code     Disposition Plan: Home Status is: Inpatient  Objective: Blood pressure 129/74, pulse 68, temperature 98.9 F (37.2 C), resp. rate 20, height 5\' 4"  (1.626 m), weight 82.2 kg, SpO2 98%.  Examination:     General: Appearance:    Obese female who appears uncomfortable     Lungs:      respirations unlabored  Heart:    Normal heart rate. Normal rhythm. No murmurs, rubs, or gallops.   MS:   All extremities are intact.   Neurologic:   Awake, alert      Consultants:  GI - signed off General surgery Gyn-onc - signed off Oncology IR  Procedures:  06/30/23: Exploratory laparotomy, distal small bowel resection with primary anastomosis, excisional biopsy of pelvic, multiple abdominal wall and omental masses   Data  Reviewed: Results for orders placed or performed during the hospital encounter of 06/20/23 (from the past 24 hour(s))  Glucose, capillary     Status: Abnormal   Collection Time: 07/13/23 12:31 PM  Result Value Ref Range   Glucose-Capillary 133 (H) 70 - 99 mg/dL   Comment 1 Notify RN   Glucose, capillary     Status: Abnormal   Collection Time: 07/13/23  6:31 PM  Result Value Ref Range   Glucose-Capillary 139 (H) 70 - 99 mg/dL   Comment 1 Notify RN   Glucose, capillary     Status: Abnormal   Collection Time: 07/13/23 11:47 PM  Result Value Ref Range   Glucose-Capillary 135 (H) 70 - 99 mg/dL  Comprehensive metabolic panel     Status: Abnormal   Collection Time: 07/14/23  3:22 AM  Result Value Ref Range   Sodium 133 (L) 135 - 145 mmol/L   Potassium 3.9 3.5 - 5.1 mmol/L   Chloride 100 98 - 111 mmol/L   CO2 24 22 - 32 mmol/L   Glucose, Bld 147 (H) 70 - 99 mg/dL   BUN 9 6 - 20 mg/dL   Creatinine, Ser 1.61 0.44 - 1.00 mg/dL   Calcium 8.3 (L) 8.9 - 10.3 mg/dL  Total Protein 7.5 6.5 - 8.1 g/dL   Albumin 2.1 (L) 3.5 - 5.0 g/dL   AST 74 (H) 15 - 41 U/L   ALT 69 (H) 0 - 44 U/L   Alkaline Phosphatase 222 (H) 38 - 126 U/L   Total Bilirubin 0.5 0.3 - 1.2 mg/dL   GFR, Estimated >87 >56 mL/min   Anion gap 9 5 - 15  Magnesium     Status: None   Collection Time: 07/14/23  3:22 AM  Result Value Ref Range   Magnesium 2.1 1.7 - 2.4 mg/dL  Phosphorus     Status: None   Collection Time: 07/14/23  3:22 AM  Result Value Ref Range   Phosphorus 3.0 2.5 - 4.6 mg/dL  CBC     Status: Abnormal   Collection Time: 07/14/23  3:22 AM  Result Value Ref Range   WBC 12.8 (H) 4.0 - 10.5 K/uL   RBC 3.25 (L) 3.87 - 5.11 MIL/uL   Hemoglobin 8.0 (L) 12.0 - 15.0 g/dL   HCT 43.3 (L) 29.5 - 18.8 %   MCV 85.5 80.0 - 100.0 fL   MCH 24.6 (L) 26.0 - 34.0 pg   MCHC 28.8 (L) 30.0 - 36.0 g/dL   RDW 41.6 (H) 60.6 - 30.1 %   Platelets 466 (H) 150 - 400 K/uL   nRBC 0.2 0.0 - 0.2 %  Triglycerides     Status: Abnormal    Collection Time: 07/14/23  3:23 AM  Result Value Ref Range   Triglycerides 270 (H) <150 mg/dL  Glucose, capillary     Status: Abnormal   Collection Time: 07/14/23  6:05 AM  Result Value Ref Range   Glucose-Capillary 142 (H) 70 - 99 mg/dL    I have reviewed pertinent nursing notes, vitals, labs, and images as necessary. I have ordered labwork to follow up on as indicated.  I have reviewed the last notes from staff over past 24 hours. I have discussed patient's care plan and test results with nursing staff, CM/SW, and other staff as appropriate.    LOS: 24 days   Joseph Art, DO [facility] 07/14/2023, 11:44 AM

## 2023-07-14 NOTE — Progress Notes (Signed)
Mobility Specialist - Progress Note   07/14/23 1309  Mobility  Activity Ambulated with assistance in hallway  Level of Assistance Standby assist, set-up cues, supervision of patient - no hands on  Assistive Device None  Distance Ambulated (ft) 350 ft  Range of Motion/Exercises Active  Activity Response Tolerated well  Mobility Referral Yes  $Mobility charge 1 Mobility  Mobility Specialist Start Time (ACUTE ONLY) 1257  Mobility Specialist Stop Time (ACUTE ONLY) 1309  Mobility Specialist Time Calculation (min) (ACUTE ONLY) 12 min   Received in bed and agreed to mobility, had no issues throughout session. Returned to bed with all needs met.  Marilynne Halsted Mobility Specialist

## 2023-07-15 DIAGNOSIS — C179 Malignant neoplasm of small intestine, unspecified: Secondary | ICD-10-CM | POA: Diagnosis not present

## 2023-07-15 LAB — BASIC METABOLIC PANEL
Anion gap: 9 (ref 5–15)
BUN: 9 mg/dL (ref 6–20)
CO2: 24 mmol/L (ref 22–32)
Calcium: 8.4 mg/dL — ABNORMAL LOW (ref 8.9–10.3)
Chloride: 103 mmol/L (ref 98–111)
Creatinine, Ser: 0.45 mg/dL (ref 0.44–1.00)
GFR, Estimated: >60 mL/min (ref >60)
Glucose, Bld: 113 mg/dL — ABNORMAL HIGH (ref 70–99)
Potassium: 3.7 mmol/L (ref 3.5–5.1)
Sodium: 136 mmol/L (ref 135–145)

## 2023-07-15 LAB — CBC
HCT: 27.7 % — ABNORMAL LOW (ref 36.0–46.0)
Hemoglobin: 8 g/dL — ABNORMAL LOW (ref 12.0–15.0)
MCH: 25.1 pg — ABNORMAL LOW (ref 26.0–34.0)
MCHC: 28.9 g/dL — ABNORMAL LOW (ref 30.0–36.0)
MCV: 86.8 fL (ref 80.0–100.0)
Platelets: 509 10*3/uL — ABNORMAL HIGH (ref 150–400)
RBC: 3.19 MIL/uL — ABNORMAL LOW (ref 3.87–5.11)
RDW: 27.7 % — ABNORMAL HIGH (ref 11.5–15.5)
WBC: 11.8 10*3/uL — ABNORMAL HIGH (ref 4.0–10.5)
nRBC: 0.3 % — ABNORMAL HIGH (ref 0.0–0.2)

## 2023-07-15 LAB — GLUCOSE, CAPILLARY
Glucose-Capillary: 108 mg/dL — ABNORMAL HIGH (ref 70–99)
Glucose-Capillary: 139 mg/dL — ABNORMAL HIGH (ref 70–99)

## 2023-07-15 MED ORDER — AMOXICILLIN-POT CLAVULANATE 875-125 MG PO TABS
1.0000 | ORAL_TABLET | Freq: Two times a day (BID) | ORAL | Status: DC
  Administered 2023-07-15 – 2023-07-16 (×2): 1 via ORAL
  Filled 2023-07-15 (×2): qty 1

## 2023-07-15 MED ORDER — ACETAMINOPHEN 500 MG PO TABS
1000.0000 mg | ORAL_TABLET | Freq: Four times a day (QID) | ORAL | Status: DC
  Administered 2023-07-15 – 2023-07-16 (×4): 1000 mg via ORAL
  Filled 2023-07-15 (×5): qty 2

## 2023-07-15 MED ORDER — HEPARIN SOD (PORK) LOCK FLUSH 100 UNIT/ML IV SOLN
500.0000 [IU] | INTRAVENOUS | Status: DC
  Filled 2023-07-15: qty 5

## 2023-07-15 MED ORDER — METHOCARBAMOL 500 MG PO TABS
1000.0000 mg | ORAL_TABLET | Freq: Three times a day (TID) | ORAL | Status: DC
  Administered 2023-07-15 – 2023-07-16 (×4): 1000 mg via ORAL
  Filled 2023-07-15 (×4): qty 2

## 2023-07-15 MED ORDER — HEPARIN SOD (PORK) LOCK FLUSH 100 UNIT/ML IV SOLN
500.0000 [IU] | INTRAVENOUS | Status: DC | PRN
  Administered 2023-07-15: 500 [IU]
  Filled 2023-07-15: qty 5

## 2023-07-15 NOTE — Progress Notes (Signed)
PHARMACY - TOTAL PARENTERAL NUTRITION CONSULT NOTE   Indication: Small bowel obstruction and ileus  Patient Measurements: Height: 5\' 4"  (162.6 cm) Weight: 89.8 kg (197 lb 15.6 oz) IBW/kg (Calculated) : 54.7 TPN AdjBW (KG): 64.5 Body mass index is 33.98 kg/m.  Assessment:  Pharmacy consulted to start TPN on 57 yo female diagnosed with small bowel obstruction. S/p exploratory laparotomy on 9/9 and now awaiting return of bowel function.   Pt was previously on TPN from 9/2 > 9/16. TPN was resumed on 9/18 due to persistent ileus with abdominal pain, nausea, decreased PO intake and early satiety.   Glucose / Insulin: T2DM; A1c 6.5 in Feb 2024>>down to 5.5 - sSSI q6h (used 1 unit/24 hrs) - CBG (goal: <180): wnl Electrolytes: Na 136, K 3.7; All other lytes WNL, including CorrCa Renal: SCr and BUN stable WNL Hepatic: labs on 9/23: LFTs now ~1.5x ULN, Alk Phos 222 - TG 270 (9/23) - albumin 2.1 I/O: Strict I/O not measured. No mIVF -UOP: 4x unmeasured, drain: 10 mL, LBM 9/21 GI Imaging: - 9/1 Distal small-bowel obstruction with transition point in the right lower quadrant, likely associated with a complex mass in the right lower quadrant. - 9/12 MRI liver: persistently dilated SB loops; possible peritoneal mets; solitary 2 cm metastatic mass in liver. -9/20 CT Abd: 5.5 cm fluid collection, possibly reflecting post-surgical abscess GI Surgeries / Procedures:  - 9/9 exploratory laparotomy with possible bowel obstruction resection/G-tube placement: Right sided pelvic mass, small bowel resection. Multiple omental and abdominal wall excisional biopsies. (+ for adenocarcinoma)  -9/20: CT guided abdominal drain placement by IR  Central access: Double lumen PICC placed 9/2  TPN start date: 9/2>>9/16, resumed 9/18>>  Nutritional Goals: Goal TPN rate is 80 mL/hr (provides ~ 90 g of protein and 1820 kcals per day)  RD Assessment: Estimated Needs Total Energy Estimated Needs: 1700-1900 Total  Protein Estimated Needs: 85-100g Total Fluid Estimated Needs: 1.9L/day  Current Nutrition:  Diet: Soft diet - TPN - prosource 30 mL TID started on 9/23  - 9/24: pt reported eating about 40% of her lunch and dinner on 9/23  Plan: - Per Trixie Deis via [place] msg, d/c TPN today when current TPN bag runs out at 6p this evening  - Pharmacy will sign off. Re-consult [place] if need further assistance.   Lucia Gaskins, PharmD 07/15/23 7:04 AM

## 2023-07-15 NOTE — Progress Notes (Signed)
Mobility Specialist - Progress Note   07/15/23 1317  Mobility  Activity Ambulated with assistance in hallway  Level of Assistance Modified independent, requires aide device or extra time  Assistive Device Other (Comment) (IV Pole)  Distance Ambulated (ft) 500 ft  Activity Response Tolerated well  Mobility Referral Yes  $Mobility charge 1 Mobility  Mobility Specialist Start Time (ACUTE ONLY) 0105  Mobility Specialist Stop Time (ACUTE ONLY) 0117  Mobility Specialist Time Calculation (min) (ACUTE ONLY) 12 min   Pt received in bed and agreeable to mobility. No complaints during session. Pt to bed after session with all needs met.    [facility]

## 2023-07-15 NOTE — Progress Notes (Signed)
Progress Note    Linda [name]   ZOX:[id number]  DOB: 02-Apr-1966  DOA: 06/20/2023     25 PCP: Arnette Felts, [name]    Hospital Course: Linda [name] is a 57 yo female with PMH anemia, HTN, pre-DM who presented with abdominal pain.  She recently underwent EGD on 05/08/2023.  She was started on H. pylori treatment after procedure.  She completed treatment course approximately 2 weeks prior to admission.  Due to worsening weakness and ongoing abdominal discomfort, she presented to the ER for further evaluation. Hemoglobin was found to be 5.9 g/dL.  She was admitted for blood transfusion and GI evaluation. She had recent colonoscopy and upper endoscopy performed June 2023.  She was not recommended for repeat scopes. CT was notable for multiple findings.  There was a large mass measuring 4.7 x 3 cm noted in the right lower quadrant and also large heterogenous mass superior to the cervix measuring 7.9 x 6.1 cm. Multiple dilated loops of bowel concerning for compression/obstruction were also appreciated. She also has a recent history over the past year of dysfunctional uterine bleeding and thickened endometrium noted on outpatient ultrasound and again seen on CT.  She had declined outpatient endometrial biopsy.  Due to these findings, further workup commenced with consulting surgery, oncology, and gyn-oncology. Tumor markers ordered as well.  On 9/9, she ultimately underwent ex-lap with distal small bowel resection with primary anastomosis along with excisional biopsy of pelvic, multiple abdominal wall and omental masses.  Slow to improve. Drain placed 9/20 by IR-- growing e coli.  Also developed an ileus that has since resolved.   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 Known thickened endometrium again seen and recent hx DUB that was being monitored outpatient.  - initial concern was for GYN malignancy; this has now been ruled out after ex-lap and pathology results peritoneal nodule needle core biopsy from 06/24/2023; endometrial biopsy also negative from 06/24/2023 -Patient underwent ex lap with distal small bowel resection with primary anastomosis, excisional biopsy of pelvic, multiple abdominal wall and omental masses on 06/30/2023 - general surgery following- CT scan on 9/18:-CT with some inflammatory changes near anastomosis, continue antibiotics  - oncology also now on board and following for further outpatient plans  -drain placed 9/20 by IR- growing e coli (on zosyn)  SBO (small bowel obstruction) (HCC) - Presented with distended abdomen and decreased bowel sounds.  Initially suspected constipation contributing but after CT, concern is now for underlying obstruction which may be due to mass effect - general surgery following as well - now s/p ex-lap with small bowel resection with primary anastomosis and excisional biopsy of pelvic, multi abdominal wall, and omental masses on 06/30/2023 -diet advanced to soft on 9/23  Microcytic anemia - Multifactorial etiology at this point.  Overt iron deficiency on admission, newly diagnosed malignancy, and some expected blood loss from recent surgery on 06/30/2023 -Still needs DVT prophylaxis with HSQ given high risk of hypercoagulability/VTE; also needs Khorana score (currently at least 3 points, high risk) calculated prior to discharge and/or before treatment most likely   IDA (iron deficiency anemia) - on admission: iron labs: Iron 9, sat ratio 2%, TIBC 444. Ferritin 8 - s/p 4  units PRBC so far since admission and received ferrlicit  Upper GI bleed-resolved as of 07/03/2023 - Possibly slow/chronic blood loss from underlying "chronic inactive gastritis" noted on EGD from  05/08/2023.  No H. pylori organisms were noted on immunohistochemical stain but due to pattern of inflammation, she was started on H. pylori treatment which she completed approximately 2 weeks prior to admission -Stools were melanotic in appearance but she states she has also been on oral iron daily -Hemoglobin certainly low on admission, 5.9 g/dL.   - now also s/p ex-lap with presumed some blood loss as expected - will continue to trend H/H and transfuse as necessary - no further scopes per GI at this time  Type 2 diabetes mellitus with obesity (HCC) - Last A1c 6.5% on 12/05/2022 - SSI ordered in setting of TPN use  - once TPN fully off, will d/c SSI  Essential hypertension - hold ARB in setting of above - PRN meds to be added; can make scheduled if needed  Obesity Estimated body mass index is 33.98 kg/m as calculated from the following:   Height as of this encounter: 5\' 4"  (1.626 m).   Weight as of this encounter: 89.8 kg.   DVT prophylaxis:  heparin injection 5,000 Units Start: 07/11/23 2200 SCDs Start: 06/20/23 2059   Code Status:   Code Status: Full Code     Disposition Plan: Home Status is: Inpatient  Objective: Blood pressure (!) 144/75, pulse 68, temperature 98.1 F (36.7 C), resp. rate 17, height 5\' 4"  (1.626 m), weight 89.8 kg, SpO2 100%.  Examination:      General: Appearance:    Obese female in no acute distress     Lungs:      respirations unlabored  Heart:    Normal heart rate.    MS:   All extremities are intact.   Neurologic:   Awake, alert      Consultants:  GI - signed off General surgery Gyn-onc - signed off Oncology IR   Data Reviewed: Results for orders placed or performed during the hospital encounter of 06/20/23 (from the past 24 hour(s))  Glucose, capillary     Status: Abnormal   Collection Time: 07/14/23 12:14 PM  Result Value Ref Range   Glucose-Capillary 152 (H) 70 - 99 mg/dL  Glucose, capillary     Status: Abnormal    Collection Time: 07/14/23  1:34 PM  Result Value Ref Range   Glucose-Capillary 117 (H) 70 - 99 mg/dL   Comment 1 Notify RN   Glucose, capillary     Status: Abnormal   Collection Time: 07/14/23  6:00 PM  Result Value Ref Range   Glucose-Capillary 112 (H) 70 - 99 mg/dL   Comment 1 Notify RN   Glucose, capillary     Status: Abnormal   Collection Time: 07/14/23 11:34 PM  Result Value Ref Range   Glucose-Capillary 129 (H) 70 - 99 mg/dL  CBC     Status: Abnormal   Collection Time: 07/15/23  3:37 AM  Result Value Ref Range   WBC 11.8 (H) 4.0 - 10.5 K/uL   RBC 3.19 (L) 3.87 - 5.11 MIL/uL   Hemoglobin 8.0 (L) 12.0 - 15.0 g/dL   HCT 13.0 (L) 86.5 - 78.4 %   MCV 86.8 80.0 - 100.0 fL   MCH 25.1 (L) 26.0 - 34.0 pg   MCHC 28.9 (L) 30.0 - 36.0 g/dL   RDW 69.6 (H) 29.5 - 28.4 %   Platelets 509 (H) 150 - 400  K/uL   nRBC 0.3 (H) 0.0 - 0.2 %  Basic metabolic panel     Status: Abnormal   Collection Time: 07/15/23  3:37 AM  Result Value Ref Range   Sodium 136 135 - 145 mmol/L   Potassium 3.7 3.5 - 5.1 mmol/L   Chloride 103 98 - 111 mmol/L   CO2 24 22 - 32 mmol/L   Glucose, Bld 113 (H) 70 - 99 mg/dL   BUN 9 6 - 20 mg/dL   Creatinine, Ser 4.74 0.44 - 1.00 mg/dL   Calcium 8.4 (L) 8.9 - 10.3 mg/dL   GFR, Estimated >25 >95 mL/min   Anion gap 9 5 - 15  Glucose, capillary     Status: Abnormal   Collection Time: 07/15/23  5:59 AM  Result Value Ref Range   Glucose-Capillary 108 (H) 70 - 99 mg/dL    I have reviewed pertinent nursing notes, vitals, labs, and images as necessary. I have ordered labwork to follow up on as indicated.  I have reviewed the last notes from staff over past 24 hours. I have discussed patient's care plan and test results with nursing staff, CM/SW, and other staff as appropriate.    LOS: 25 days   Joseph Art, DO [facility] 07/15/2023, 10:25 AM

## 2023-07-15 NOTE — Progress Notes (Signed)
Progress Note  15 Days Post-Op  Subjective: Pt tolerated soft diet and reports eating about 40% of meals yesterday. Having bowel function. Discussion about oral pain control and patient agreeable to try to transition meds.   Objective: Vital signs in last 24 hours: Temp:  [98.1 F (36.7 C)-99 F (37.2 C)] 98.1 F (36.7 C) (09/24 0438) Pulse Rate:  [68-69] 68 (09/24 0438) Resp:  [17-18] 17 (09/24 0438) BP: (134-144)/(63-75) 144/75 (09/24 0438) SpO2:  [100 %] 100 % (09/24 0438) Weight:  [89.8 kg] 89.8 kg (09/24 0500) Last BM Date : 07/15/23  Intake/Output from previous day: 09/23 0701 - 09/24 0700 In: 5  Out: 10 [Drains:10] Intake/Output this shift: Total I/O In: 5 [Other:5] Out: -   PE: General: pleasant, WD, obese female who is laying in bed in NAD Heart: regular, rate, and rhythm.  Lungs:  Respiratory effort nonlabored Abd: soft, appropriately ttp, mild distention, drain with seropurulent fluid Psych: A&Ox3 with an appropriate affect.    Lab Results:  Recent Labs    07/14/23 0322 07/15/23 0337  WBC 12.8* 11.8*  HGB 8.0* 8.0*  HCT 27.8* 27.7*  PLT 466* 509*   BMET Recent Labs    07/14/23 0322 07/15/23 0337  NA 133* 136  K 3.9 3.7  CL 100 103  CO2 24 24  GLUCOSE 147* 113*  BUN 9 9  CREATININE 0.64 0.45  CALCIUM 8.3* 8.4*   PT/INR No results for input(s): "LABPROT", "INR" in the last 72 hours. CMP     Component Value Date/Time   NA 136 07/15/2023 0337   NA 141 05/16/2022 1044   K 3.7 07/15/2023 0337   CL 103 07/15/2023 0337   CO2 24 07/15/2023 0337   GLUCOSE 113 (H) 07/15/2023 0337   BUN 9 07/15/2023 0337   BUN 9 05/16/2022 1044   CREATININE 0.45 07/15/2023 0337   CALCIUM 8.4 (L) 07/15/2023 0337   PROT 7.5 07/14/2023 0322   PROT 7.8 05/16/2022 1044   ALBUMIN 2.1 (L) 07/14/2023 0322   ALBUMIN 4.4 05/16/2022 1044   AST 74 (H) 07/14/2023 0322   ALT 69 (H) 07/14/2023 0322   ALKPHOS 222 (H) 07/14/2023 0322   BILITOT 0.5 07/14/2023 0322    BILITOT <0.2 05/16/2022 1044   GFRNONAA >60 07/15/2023 0337   Lipase     Component Value Date/Time   LIPASE 35 06/20/2023 1436       Studies/Results: No results found.  Anti-infectives: Anti-infectives (From admission, onward)    Start     Dose/Rate Route Frequency Ordered Stop   07/09/23 1600  piperacillin-tazobactam (ZOSYN) IVPB 3.375 g        3.375 g 12.5 mL/hr over 240 Minutes Intravenous Every 8 hours 07/09/23 1510     06/30/23 0830  ceFAZolin (ANCEF) IVPB 2g/100 mL premix        2 g 200 mL/hr over 30 Minutes Intravenous On call to O.R. 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 Mosetta Putt is following Port placed 9/17   POD15 s/p ex-lap, SBR and biopsy of multiple sites 9/10 Dr. Fredricka Bonine   - incision clean - ok to change once daily at this point, can remove remaining staples prior  to DC - tolerating soft diet - drain placed in RLQ abscess 9/20 - pan sensitive E.coli  - continue weaning TPN, ok to DC after today - work on transition to PO pain meds today  - maybe ready for DC in the next 24-48 hrs  FEN: soft diet, wean TPN VTE: SQH ID: [id number] 9/18>>   LOS: 25 days     Juliet Rude, [facility] 07/15/2023, 11:47 AM Please see Amion for pager number during day hours 7:00am-4:30pm

## 2023-07-15 NOTE — Discharge Instructions (Signed)
[facility]      [facility] °[phone] ° °OPEN ABDOMINAL SURGERY: POST OP INSTRUCTIONS ° °Always review your discharge instruction sheet given to you by the facility where your surgery was performed. ° °IF YOU HAVE DISABILITY OR FAMILY LEAVE FORMS, YOU MUST BRING THEM TO THE OFFICE FOR PROCESSING.  PLEASE DO NOT GIVE THEM TO YOUR DOCTOR. ° °A prescription for pain medication may be given to you upon discharge.  Take your pain medication as prescribed, if needed.  If narcotic pain medicine is not needed, then you may take acetaminophen (Tylenol) or ibuprofen (Advil) as needed. °Take your usually prescribed medications unless otherwise directed. °If you need a refill on your pain medication, please contact your pharmacy. They will contact our office to request authorization.  Prescriptions will not be filled after 5pm or on week-ends. °You should follow a light diet the first few days after arrival home, such as soup and crackers, pudding, etc.unless your doctor has advised otherwise. A high-fiber, low fat diet can be resumed as tolerated.   Be sure to include lots of fluids daily. Most patients will experience some swelling and bruising on the chest and neck area.  Ice packs will help.  Swelling and bruising can take several days to resolve °Most patients will experience some swelling and bruising in the area of the incision. Ice pack will help. Swelling and bruising can take several days to resolve..  °It is common to experience some constipation if taking pain medication after surgery.  Increasing fluid intake and taking a stool softener will usually help or prevent this problem from occurring.  A mild laxative (Milk of Magnesia or Miralax) should be taken according to package directions if there are no bowel movements after 48 hours. ° You may have steri-strips (small skin tapes) in place directly over the incision.  These strips should be left on the skin for 7-10 days.  If your surgeon used skin  glue on the incision, you may shower in 24 hours.  The glue will flake off over the next 2-3 weeks.  Any sutures or staples will be removed at the office during your follow-up visit. You may find that a light gauze bandage over your incision may keep your staples from being rubbed or pulled. You may shower and replace the bandage daily. °ACTIVITIES:  You may resume regular (light) daily activities beginning the next day--such as daily self-care, walking, climbing stairs--gradually increasing activities as tolerated.  You may have sexual intercourse when it is comfortable.  Refrain from any heavy lifting or straining until approved by your doctor. °You may drive when you no longer are taking prescription pain medication, you can comfortably wear a seatbelt, and you can safely maneuver your car and apply brakes ° °You should see your doctor in the office for a follow-up appointment approximately two weeks after your surgery.  Make sure that you call for this appointment within a day or two after you arrive home to insure a convenient appointment time. ° °WHEN TO CALL YOUR DOCTOR: °Fever over 101.0 °Inability to urinate °Nausea and/or vomiting °Extreme swelling or bruising °Continued bleeding from incision. °Increased pain, redness, or drainage from the incision. °Difficulty swallowing or breathing °Muscle cramping or spasms. °Numbness or tingling in hands or feet or around lips. ° °The clinic staff is available to answer your questions during regular business hours.  Please don’t hesitate to call and ask to speak to one of the nurses if you have concerns. ° °For   further questions, please visit [email or web address]  °

## 2023-07-15 NOTE — Plan of Care (Signed)

## 2023-07-16 ENCOUNTER — Other Ambulatory Visit: Payer: BC Managed Care – PPO

## 2023-07-16 ENCOUNTER — Other Ambulatory Visit (HOSPITAL_BASED_OUTPATIENT_CLINIC_OR_DEPARTMENT_OTHER): Payer: Self-pay

## 2023-07-16 ENCOUNTER — Other Ambulatory Visit (HOSPITAL_COMMUNITY): Payer: Self-pay

## 2023-07-16 ENCOUNTER — Inpatient Hospital Stay (HOSPITAL_COMMUNITY): Payer: BC Managed Care – PPO

## 2023-07-16 DIAGNOSIS — C179 Malignant neoplasm of small intestine, unspecified: Secondary | ICD-10-CM | POA: Diagnosis not present

## 2023-07-16 MED ORDER — SIMETHICONE 80 MG PO CHEW
80.0000 mg | CHEWABLE_TABLET | Freq: Four times a day (QID) | ORAL | 0 refills | Status: AC | PRN
  Filled 2023-07-16: qty 100, 25d supply, fill #0
  Filled 2023-07-16: qty 70, 18d supply, fill #0

## 2023-07-16 MED ORDER — PANTOPRAZOLE SODIUM 40 MG PO TBEC
40.0000 mg | DELAYED_RELEASE_TABLET | Freq: Two times a day (BID) | ORAL | 1 refills | Status: DC
  Filled 2023-07-16 (×2): qty 60, 30d supply, fill #0

## 2023-07-16 MED ORDER — AMOXICILLIN-POT CLAVULANATE 875-125 MG PO TABS
1.0000 | ORAL_TABLET | Freq: Two times a day (BID) | ORAL | 0 refills | Status: AC
  Filled 2023-07-16: qty 14, 7d supply, fill #0

## 2023-07-16 MED ORDER — IOHEXOL 300 MG/ML  SOLN
75.0000 mL | Freq: Once | INTRAMUSCULAR | Status: AC | PRN
  Administered 2023-07-16: 100 mL via INTRAVENOUS

## 2023-07-16 MED ORDER — IOHEXOL 300 MG/ML  SOLN
100.0000 mL | Freq: Once | INTRAMUSCULAR | Status: AC | PRN
  Administered 2023-07-16: 10 mL

## 2023-07-16 MED ORDER — ACETAMINOPHEN 500 MG PO TABS: 1000.0000 mg | ORAL_TABLET | Freq: Three times a day (TID) | ORAL | Status: AC | PRN

## 2023-07-16 MED ORDER — POLYETHYLENE GLYCOL 3350 17 GM/SCOOP PO POWD
17.0000 g | Freq: Every day | ORAL | 0 refills | Status: AC
  Filled 2023-07-16: qty 14, 14d supply, fill #0
  Filled 2023-07-16: qty 238, 14d supply, fill #0

## 2023-07-16 MED ORDER — OXYCODONE HCL 5 MG PO TABS
5.0000 mg | ORAL_TABLET | ORAL | 0 refills | Status: DC | PRN
  Filled 2023-07-16: qty 30, 3d supply, fill #0

## 2023-07-16 MED ORDER — METHOCARBAMOL 500 MG PO TABS
1000.0000 mg | ORAL_TABLET | Freq: Three times a day (TID) | ORAL | 0 refills | Status: DC | PRN
  Filled 2023-07-16: qty 30, 5d supply, fill #0

## 2023-07-16 NOTE — Progress Notes (Signed)
Referring Physician(s): Kinsinger,L  Supervising Physician: Mir, [employer] school teacher  Patient Status:  [facility] - In-pt  Chief Complaint:  Abdominal fluid collection   Subjective: Pt doing well this am; denies fever, N/V, worsening abd pain   Allergies: Patient has no known allergies.  Medications: Prior to Admission medications   Medication Sig Start Date End Date Taking? Authorizing Provider  Ferric Maltol (ACCRUFER) 30 MG CAPS Take 1 capsule (30 mg total) by mouth daily. 03/05/23  Yes Arnette Felts, [name]  linaclotide [facility]) 290 MCG CAPS capsule Take 1 capsule (290 mcg total) by mouth 20 minutes before breakfast 05/09/23  Yes   Semaglutide (RYBELSUS) 7 MG TABS Take 1 tablet (7 mg total) by mouth daily. 01/13/23  Yes Arnette Felts, [name]  valsartan (DIOVAN) 160 MG tablet Take 1 tablet (160 mg total) by mouth daily. 10/03/22 10/03/23 Yes Arnette Felts, [name]  amoxicillin (AMOXIL) 500 MG capsule Take 2 capsules (1,000 mg total) by mouth 2 (two) times daily for 14 days Patient not taking: Reported on 06/20/2023 05/19/23     clarithromycin (BIAXIN) 500 MG tablet Take 1 tablet (500 mg total) by mouth every 12 (twelve) hours for 14 days Patient not taking: Reported on 06/20/2023 05/19/23     ferrous sulfate 325 (65 FE) MG tablet Take 1 tablet (325 mg total) by mouth daily. Patient not taking: Reported on 03/05/2023 02/22/22   Dione Booze, MD  fluticasone [facility]) 50 MCG/ACT nasal spray Place 2 sprays into both nostrils daily. Patient not taking: Reported on 03/05/2023 07/17/22   Henderly, Britni A, PA-C  lansoprazole (PREVACID) 30 MG capsule Take 1 capsule (30 mg total) by mouth 2 (two) times daily for 14 days Patient not taking: Reported on 06/20/2023 05/19/23     Vitamin D, Ergocalciferol, (DRISDOL) 1.25 MG (50000 UNIT) CAPS capsule Take 1 capsule (50,000 Units total) by mouth 2 (two) times a week. Patient not taking: Reported on 06/20/2023 01/23/23   Arnette Felts, [name]     Vital Signs: BP 133/74 (BP  Location: Left Arm)   Pulse 64   Temp 98.1 F (36.7 C)   Resp 18   Ht 5\' 4"  (1.626 m)   Wt 296 lb 8 oz (134.5 kg)   SpO2 97%   BMI 50.89 kg/m   Physical Exam: awake, alert.  Right lower quadrant drain intact, insertion site okay, not significantly tender, output about 10 cc serosanguineous fluid, drain flushed but with minimal return and some pain associated with flush   Imaging: No results found.  Labs:  CBC: Recent Labs    07/12/23 0444 07/13/23 0401 07/14/23 0322 07/15/23 0337  WBC 14.2* 14.4* 12.8* 11.8*  HGB 7.7* 7.8* 8.0* 8.0*  HCT 27.7* 27.7* 27.8* 27.7*  PLT 419* 429* 466* 509*    COAGS: Recent Labs    06/20/23 1436 06/24/23 0942 07/11/23 0417  INR 1.1 1.2 1.2    BMP: Recent Labs    07/12/23 0444 07/13/23 0401 07/14/23 0322 07/15/23 0337  NA 136 136 133* 136  K 4.0 3.9 3.9 3.7  CL 102 102 100 103  CO2 23 26 24 24   GLUCOSE 141* 160* 147* 113*  BUN 8 9 9 9   CALCIUM 8.1* 8.2* 8.3* 8.4*  CREATININE 0.65 0.67 0.64 0.45  GFRNONAA >60 >60 >60 >60    LIVER FUNCTION TESTS: Recent Labs    07/03/23 0354 07/07/23 0454 07/10/23 0421 07/14/23 0322  BILITOT 0.9 0.6 0.4 0.5  AST 13* 20 41 74*  ALT 12 15 29  69*  ALKPHOS 79 137* 125 222*  PROT 6.4* 7.3 6.5 7.5  ALBUMIN 2.4* 2.4* 1.9* 2.1*    Assessment and Plan: 57 year old female with PMH significant for anemia, hypertension,  pre-diabetes and newly diagnosed colon adenocarcinoma. Patient has been admitted to [facility] since 06/20/23. During her stay, she underwent ex-lap with distal small bowel resection and excisional biopsy of pelvic, abdominal wall, and omental masses c/w with colon adenocarcinoma;  Status post drainage of right lower quadrant abdominal fluid collection on 9/20 (10 fr to JP); afebrile, no new labs today; drain fluid cx- e coli, lactobacillus, bacteroides; with minimal drain output over past few days and some pain with drain flushes will obtain f/u CT to assess for possible drain  removal/need for drain manipulation, rule out any additional collections.   Electronically Signed: D. Jeananne Rama, PA-C 07/16/2023, 11:10 AM   I spent a total of 15 Minutes at the the patient's bedside AND on the patient's hospital floor or unit, greater than 50% of which was counseling/coordinating care for right lower abdominal abscess drain    Patient ID: Linda [name], female   DOB: 05/05/1966, 57 y.o.   MRN: [id number]

## 2023-07-16 NOTE — Plan of Care (Signed)

## 2023-07-16 NOTE — Discharge Summary (Signed)
Physician Discharge Summary   Patient: Linda [name] MRN: [id number] DOB: [birth year]  Admit date:     06/20/2023  Discharge date: 07/16/23  Discharge Physician: Jonah Blue   PCP: Arnette Felts, [name]   Recommendations at discharge:   Follow up with Dr. Fredricka Bonine from surgery in 2-3 weeks Follow up with [name] Christell Constant in 1-2 weeks Hold valsartan (Diovan) until discussion with PCP Take Augmentin twice daily for 7 days, until gone Take Oxy IR as needed for pain Hold semaglutide Call Dr. 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  S/p EGD on 05/08/2023, completed treatment for H pylori 2 weeks prior to admission. On oral iron and having dark stools. Hemoglobin was on admission.  RUQ US showed hepatic steatosis and hypoechoic region in the right lobe liver.  CT with large mass (4.7 x 3 cm) in RLQ and  large heterogenous mass superior to the cervix (7.9 x 6.1 cm).  Also with multiple dilated loops of bowel concerning for compression/obstruction.  She also has h/o DUB and thickened endometrium noted on outpatient ultrasound and  CT, declined outpatient endometrial biopsy. Consulted surgery, oncology, and gyn-oncology.  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   Drain placed 9/20 by IR growing E coli.  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 Known thickened endometrium again seen and recent hx DUB that was being monitored outpatient.  -initial concern was for GYN malignancy; this has now been ruled out after ex-lap and pathology results peritoneal nodule needle core biopsy from 06/24/2023; endometrial biopsy also negative from 06/24/2023 -Patient underwent ex lap with distal small bowel resection with primary anastomosis, excisional biopsy of pelvic, multiple abdominal wall and omental masses on 06/30/2023 -general surgery following- CT scan on 9/18:-CT with some inflammatory changes near anastomosis, continue antibiotics  -oncology also now on board and following for further outpatient plans  -drain placed 9/20 by IR- growing e coli (on zosyn -> Augmentin) -She is eating/drinking, mobilizing, and having BMs; she is stable for dc at this time -Patient had f/u CT today to determine if drain can be removed prior to dc; drain was removed by IR -Staples removed on 9/25 -Has port and will need outpatient oncology f/u -She will need to have her infection resolved prior to starting chemotherapy   SBO (small bowel obstruction) (HCC) -Presented with distended abdomen and decreased bowel sounds.  Initially suspected constipation contributing but after CT concern for underlying obstruction which may be due to mass effect -general surgery following as well -now s/p ex-lap with small bowel resection with primary anastomosis and excisional biopsy of pelvic, multi abdominal  wall, and omental masses on 06/30/2023 -diet advanced to soft on 9/23 without difficulty   Microcytic anemia -Multifactorial etiology at this point.  Overt iron deficiency on admission, newly diagnosed malignancy, and  some expected blood loss from recent surgery on 06/30/2023 -Still needs DVT prophylaxis with HSQ given high risk of hypercoagulability/VTE; by Angelia Mould score she is at intermediate risk for VTE   IDA (iron deficiency anemia) -on admission: iron labs: Iron 9, sat ratio 2%, TIBC 444. Ferritin 8 -s/p 4 units PRBC so far since admission and received ferrlicit   Upper GI bleed -Resolved as of 07/03/2023 -Possibly slow/chronic blood loss from underlying "chronic inactive gastritis" noted on EGD from 05/08/2023.  No H. pylori organisms were noted on immunohistochemical stain but due to pattern of inflammation, she was started on H. pylori treatment which she completed approximately 2 weeks prior to admission -Stools were melanotic in appearance but she states she has also been on oral iron daily -Hemoglobin certainly low on admission, 5.9 g/dL.   -now also s/p ex-lap with presumed some blood loss as expected -no further scopes per GI at this time -Hgb is stable at this time   Type 2 diabetes mellitus with obesity (HCC) -Last A1c 6.5% on 12/05/2022 -SSI ordered in setting of TPN use but stopped when off TPN   Essential hypertension -Held during hospitalization -Consider restarting as an outpatient   Morbid Obesity -Body mass index is 50.89 kg/m..  -Weight loss should be encouraged -Outpatient PCP/bariatric medicine f/u encouraged  Nutrition Problem: Inadequate oral intake Etiology: chronic illness Signs/Symptoms: per patient/family report Interventions: Boost Breeze           Consultants: Surgery Oncology IR Gyn Onc GI Nutrition   Procedures: Ex lap 9/9 IR drain 9/20 Port placement 9/17   Antibiotics: Zosyn (off) Augmentin 9/24-30   30 Day Unplanned Readmission Risk Score    Flowsheet Row ED to Hosp-Admission (Current) from 06/20/2023 in [facility]  30 Day Unplanned Readmission Risk Score (%) 31.13 Filed at 07/16/2023 1200        This score is the patient's risk of an unplanned readmission within 30 days of being discharged (0 -100%). The score is based on dignosis, age, lab data, medications, orders, and past utilization.   Low:  0-14.9   Medium: 15-21.9   High: 22-29.9   Extreme: 30 and above              Pain control - [place] Controlled Substance Reporting System database was reviewed. and patient was instructed, not to drive, operate heavy machinery, perform activities at heights, swimming or participation in water activities or provide baby-sitting services while on Pain, Sleep and Anxiety Medications; until their outpatient Physician has advised to do so again. Also recommended to not to take more than prescribed Pain, Sleep and Anxiety Medications.   Disposition: Home Diet recommendation:  Regular diet DISCHARGE MEDICATION: Allergies as of 07/16/2023   No Known Allergies      Medication List     STOP taking these medications    ACCRUFeR 30 MG Caps Generic drug: Ferric Maltol   amoxicillin 500 MG capsule Commonly known as: AMOXIL   clarithromycin 500 MG tablet Commonly known as: BIAXIN   FeroSul 325 (65 Fe) MG tablet Generic drug: ferrous sulfate   fluticasone 50 MCG/ACT nasal spray Commonly known as: FLONASE   lansoprazole 30 MG capsule Commonly known as: PREVACID   Linzess 290 MCG Caps capsule Generic drug: linaclotide  Rybelsus 7 MG Tabs Generic drug: Semaglutide   valsartan 160 MG tablet Commonly known as: Diovan   Vitamin D (Ergocalciferol) 1.25 MG (50000 UNIT) Caps capsule Commonly known as: DRISDOL       TAKE these medications    acetaminophen 500 MG tablet Commonly known as: TYLENOL Take 2 tablets (1,000 mg total) by mouth every 8 (eight) hours as needed for mild pain or fever.   amoxicillin-clavulanate 875-125 MG tablet Commonly known as: AUGMENTIN Take 1 tablet by mouth every 12 (twelve) hours for 7 days.   methocarbamol 500 MG tablet Commonly  known as: ROBAXIN Take 2 tablets (1,000 mg total) by mouth every 8 (eight) hours as needed for muscle spasms.   oxyCODONE 5 MG immediate release tablet Commonly known as: Oxy IR/ROXICODONE Take 1-2 tablets (5-10 mg total) by mouth every 4 (four) hours as needed for moderate pain or severe pain (5mg  moderate, 10mg  severe).   pantoprazole 40 MG tablet Commonly known as: PROTONIX Take 1 tablet (40 mg total) by mouth 2 (two) times daily.   polyethylene glycol 17 g packet Commonly known as: MIRALAX / GLYCOLAX Take 17 g by mouth daily. Start taking on: July 17, 2023   simethicone 80 MG chewable tablet Commonly known as: MYLICON Chew 1 tablet (80 mg total) by mouth 4 (four) times daily as needed for flatulence.               Discharge Care Instructions  (From admission, onward)           Start     Ordered   07/16/23 0000  Discharge wound care:       Comments: Pack inferior portion of abdominal wound with saline moistened gauze (tunnels proximally). Apply dry abd pad and secure with tape. Change daily and PRN saturation. Shower with wound open   07/16/23 1532            Follow-up Information     Berna Bue, MD. Call.   Specialty: General Surgery Why: Call to confirm follow up in 3-4 weeks, please arrive 30 min prior to appointment time. Contact information: [address] [place] [address] [phone]                Discharge Exam: Ceasar Mons Weights   07/14/23 0500 07/15/23 0500 07/16/23 0500  Weight: 82.2 kg 89.8 kg 134.5 kg     Subjective: Feeling well today and excited about the prospect of home. Eating, stooling, walking without difficulty.  No fevers.   Objective: Vitals:   07/16/23 0523 07/16/23 1204  BP: 133/74 138/74  Pulse: 64 78  Resp: 18   Temp: 98.1 F (36.7 C) 98.5 F (36.9 C)  SpO2: 97% 98%    Intake/Output Summary (Last 24 hours) at 07/16/2023 1533 Last data filed at 07/16/2023 1434 Gross per 24  hour  Intake 801.69 ml  Output 5 ml  Net 796.69 ml   Filed Weights   07/14/23 0500 07/15/23 0500 07/16/23 0500  Weight: 82.2 kg 89.8 kg 134.5 kg    Exam:  General:  Appears calm and comfortable and is in NAD Eyes:   EOMI, normal lids, iris ENT:  grossly normal hearing, lips & tongue, mmm Neck:  no LAD, masses or thyromegaly Cardiovascular:  RRR, no m/r/g. No LE edema.  Respiratory:   CTA bilaterally with no wheezes/rales/rhonchi.  Normal respiratory effort. Abdomen:  soft, NT, ND; staples in place, wound midline and C/D/I Skin:  no rash or induration seen on  limited exam Musculoskeletal:  grossly normal tone BUE/BLE, good ROM, no bony abnormality Psychiatric:  grossly normal mood and affect, speech fluent and appropriate, AOx3 Neurologic:  CN 2-12 grossly intact, moves all extremities in coordinated fashion   Data Reviewed: I have reviewed the patient's lab results since admission.  Pertinent labs for today include:   Glucose 113 WBC 11.8 Hgb 8 - stable Platelets 509, increasing     Condition at discharge: improving  The results of significant diagnostics from this hospitalization (including imaging, microbiology, ancillary and laboratory) are listed below for reference.   Imaging Studies: DG Sinus/Fist Tube Chk-Non GI  Result Date: 07/16/2023 INDICATION: 57 year old woman developed postop public abscess and underwent CT-guided drain placement on 07/11/2023. She has had less than 10 mL of output and CT shows resolution of abscess. EXAM: Fluoroscopic abscessogram MEDICATIONS: The patient is currently admitted to the hospital and receiving intravenous antibiotics. The antibiotics were administered within an appropriate time frame prior to the initiation of the procedure. ANESTHESIA/SEDATION: None COMPLICATIONS: None immediate. PROCEDURE: Informed written consent was obtained from the patient after a thorough discussion of the procedural risks, benefits and alternatives. All  questions were addressed. A timeout was performed prior to the initiation of the procedure. Scout image shows the right pelvic drain in appropriate position. Contrast administered through the drain showed minimal abscess cavity with no fistulous communication. The drain was removed without difficulty. The site was covered with sterile dressing. IMPRESSION: Pelvic abscessogram shows no residual abscess. No fistulous communication was seen. The drain was removed. Electronically Signed   By: Acquanetta Belling M.D.   On: 07/16/2023 15:18   CT GUIDED PERITONEAL/RETROPERITONEAL FLUID DRAIN BY PERC CATH  Result Date: 07/11/2023 INDICATION: 57 year old female with history of right lower quadrant postsurgical abdominal fluid collection concerning for abscess. EXAM: CT PERC DRAIN PERITONEAL ABCESS COMPARISON:  None Available. MEDICATIONS: The patient is currently admitted to the hospital and receiving intravenous antibiotics. The antibiotics were administered within an appropriate time frame prior to the initiation of the procedure. ANESTHESIA/SEDATION: Moderate (conscious) sedation was employed during this procedure. A total of Versed 2 mg and Fentanyl 100 mcg was administered intravenously. Moderate Sedation Time: 19 minutes. The patient's level of consciousness and vital signs were monitored continuously by radiology nursing throughout the procedure under my direct supervision. CONTRAST:  None COMPLICATIONS: None immediate. PROCEDURE: RADIATION DOSE REDUCTION: This exam was performed according to the departmental dose-optimization program which includes automated exposure control, adjustment of the mA and/or kV according to patient size and/or use of iterative reconstruction technique. Informed written consent was obtained from the patient after a discussion of the risks, benefits and alternatives to treatment. The patient was placed supine on the CT gantry and a pre procedural CT was performed re-demonstrating the known  abscess/fluid collection within the right lower quadrant. The procedure was planned. A timeout was performed prior to the initiation of the procedure. The right lower quadrant was prepped and draped in the usual sterile fashion. The overlying soft tissues were anesthetized with 1% lidocaine with epinephrine. Appropriate trajectory was planned with the use of a 22 gauge spinal needle. An 18 gauge trocar needle was advanced into the abscess/fluid collection and a short Amplatz super stiff wire was coiled within the collection. Appropriate positioning was confirmed with a limited CT scan. The tract was serially dilated allowing placement of a 10 [place] all-purpose drainage catheter. Appropriate positioning was confirmed with a limited postprocedural CT scan. Approximately 50 ml of purulent fluid was aspirated. The tube  was connected to a bulb suction and sutured in place. A dressing was placed. The patient tolerated the procedure well without immediate post procedural complication. IMPRESSION: Successful CT guided placement of a 10 French all purpose drain catheter into the right lower quadrant fluid collection with aspiration of approximately 50 mL of purulent fluid. Samples were sent to the laboratory as requested by the ordering clinical team. Marliss Coots, MD [facility] Electronically Signed   By: Marliss Coots M.D.   On: 07/11/2023 15:21   CT ABDOMEN PELVIS W CONTRAST  Result Date: 07/09/2023 CLINICAL DATA:  Abdominal pain, status post exploratory laparotomy with small bowel resection and omental biopsy EXAM: CT ABDOMEN AND PELVIS WITH CONTRAST TECHNIQUE: Multidetector CT imaging of the abdomen and pelvis was performed using the standard protocol following bolus administration of intravenous contrast. RADIATION DOSE REDUCTION: This exam was performed according to the departmental dose-optimization program which includes automated exposure control,  adjustment of the mA and/or kV according to patient size and/or use of iterative reconstruction technique. CONTRAST:  OMNIPAQUE IOHEXOL 300 MG/ML  SOLN COMPARISON:  MRI abdomen dated 07/03/2023. CT abdomen/pelvis dated 06/22/2023. FINDINGS: Lower chest: Mild bibasilar atelectasis. Hepatobiliary: Subcentimeter lesion in the posterior right hepatic lobe (series 10/image 22), suspicious for metastasis on MR. Layering tiny gallstones (series 2/image 34), without associated linear changes. No intrahepatic or extrahepatic duct dilatation. Pancreas: Within normal limits. Spleen: Within normal limits Adrenals/Urinary Tract: Adrenal glands within normal limits. Kidneys are within normal limits.  No hydronephrosis. Bladder is within normal limits with trace nondependent gas. Stomach/Bowel: Stomach is within normal limits. No evidence of bowel obstruction, improved. Status post small bowel resection with suture line in the right lateral abdomen (series 2/image 60). Adjacent long segment wall thickening involving small bowel in the right lower abdomen (series 2/image 85), new, raising concern for at risk bowel. Adjacent wall thickening/inflammatory changes involving the cecum (series 2, image 54), new. No pneumatosis or free air. Adjacent fluid collection in the right lower quadrant measuring 3.5 x 3.1 x 5.5 cm (series 2/image 64), possibly reflecting a postsurgical abscess, noting that the appendix is not visualized. Mild sigmoid diverticulosis, without diverticulitis. Vascular/Lymphatic: No evidence of abdominal aortic aneurysm. Small retroperitoneal lymph nodes which do not meet pathologic CT size criteria. Reproductive: Uterus is grossly unremarkable The ovaries are poorly visualized. Other: 5.4 x 7.8 cm complex cystic peritoneal implant in the dependent pelvis (series 2/image 94). Scattered peritoneal nodularity measuring up to 18 mm medial left in the anterior abdominal wall (series 2/image 54). Some of the recent  peritoneal implants on CT are no longer evident, likely surgically excised. Musculoskeletal: Visualized osseous structures are within normal limits. IMPRESSION: Status post small bowel resection with suture line in the right lateral abdomen. Adjacent long segment wall thickening involving the distal small bowel and cecal wall thickening, new, raising concern for at risk bowel. No pneumatosis or free air. Consider surgical evaluation as clinically warranted. 5.5 cm right lower quadrant fluid collection, possibly reflecting a postsurgical abscess, noting that the appendix is not visualized. 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 Additional ancillary findings as above. Electronically Signed   By: Charline Bills M.D.   On: 07/09/2023 18:40   IR IMAGING GUIDED PORT INSERTION  Result Date: 07/08/2023 INDICATION: 57 year old female with history of colon cancer presenting for Port-A-Cath placement. EXAM: IMPLANTED PORT A CATH PLACEMENT WITH  ULTRASOUND AND FLUOROSCOPIC GUIDANCE COMPARISON:  None Available. MEDICATIONS: None. ANESTHESIA/SEDATION: Moderate (conscious) sedation was employed during this procedure. A total of Versed 2 mg and Fentanyl 100 mcg was administered intravenously. Moderate Sedation Time: 15 minutes. The patient's level of consciousness and vital signs were monitored continuously by radiology nursing throughout the procedure under my direct supervision. CONTRAST:  None FLUOROSCOPY TIME:  Five mGy COMPLICATIONS: None immediate. PROCEDURE: The procedure, risks, benefits, and alternatives were explained to the patient. Questions regarding the procedure were encouraged and answered. The patient understands and consents to the procedure. The right neck and chest were prepped with chlorhexidine in a sterile fashion, and a sterile drape was applied covering the  operative field. Maximum barrier sterile technique with sterile gowns and gloves were used for the procedure. A timeout was performed prior to the initiation of the procedure. Ultrasound was used to examine the jugular vein which was compressible and free of internal echoes. A skin marker was used to demarcate the planned venotomy and port pocket incision sites. Local anesthesia was provided to these sites and the subcutaneous tunnel track with 1% lidocaine with 1:100,000 epinephrine. A small incision was created at the jugular access site and blunt dissection was performed of the subcutaneous tissues. Under ultrasound guidance, the jugular vein was accessed with a 21 ga micropuncture needle and an 0.018" wire was inserted to the superior vena cava. Real-time ultrasound guidance was utilized for vascular access including the acquisition of a permanent ultrasound image documenting patency of the accessed vessel. A 5 Fr micopuncture set was then used, through which a 0.035" Rosen wire was passed under fluoroscopic guidance into the inferior vena cava. An 8 Fr dilator was then placed over the wire. A subcutaneous port pocket was then created along the upper chest wall utilizing a combination of sharp and blunt dissection. The pocket was irrigated with sterile saline, packed with gauze, and observed for hemorrhage. A single lumen plastic power injectable port was chosen for placement. The 8 Fr catheter was tunneled from the port pocket site to the venotomy incision. The port was placed in the pocket. The external catheter was trimmed to appropriate length. The dilator was exchanged for an 8 Fr peel-away sheath under fluoroscopic guidance. The catheter was then placed through the sheath and the sheath was removed. Final catheter positioning was confirmed and documented with a fluoroscopic spot radiograph. The port was accessed with a Huber needle, aspirated, and flushed with heparinized saline. The deep dermal layer of  the port pocket incision was closed with interrupted 3-0 Vicryl suture. The skin was opposed with a running subcuticular 4-0 Monocryl suture. Dermabond was then placed over the port pocket and neck incisions. The patient tolerated the procedure well without immediate post procedural complication. FINDINGS: After catheter placement, the tip lies within the superior cavoatrial junction. The catheter aspirates and flushes normally and is ready for immediate use. IMPRESSION: Successful placement of a power injectable Port-A-Cath via the right internal jugular vein. The catheter is ready for immediate use. Marliss Coots, MD [facility] Electronically Signed   By: Marliss Coots M.D.   On: 07/08/2023 15:15   MR LIVER W WO CONTRAST  Result Date: 07/03/2023 CLINICAL DATA:  Liver lesion.  History of colon cancer. EXAM: MRI ABDOMEN WITHOUT AND WITH CONTRAST TECHNIQUE: Multiplanar multisequence MR imaging of the abdomen was performed both before and after the administration of intravenous contrast. CONTRAST:  9mL GADAVIST GADOBUTROL 1 MMOL/ML IV SOLN COMPARISON:  CT  chest 07/03/2023. abdomen pelvis CT 06/22/2023. No older exams are available at this time FINDINGS: Lower chest: Trace pleural fluid.  Please see prior chest CT scan. Hepatobiliary: As seen on the prior CT scan there is a lesion in segment 7 measuring 18 x 16 mm on series 15, image 28. This has a aggressive features, target like appearance with mild bright T2 signal, low T1 signal and some heterogeneous enhancement. Lesion also shows abnormal diffusion and worrisome for metastatic deposit or other aggressive lesion. There are no other parenchymal liver lesions identified at this time. Patent portal vein. No biliary ductal dilatation. Gallbladder is distended. There is some layering sludge in the gallbladder or small stones. Pancreas: No mass, inflammatory changes, or other parenchymal abnormality  identified. Spleen:  Within normal limits in size and appearance. Adrenals/Urinary Tract: Adrenal glands are preserved. No enhancing mass or collecting system dilatation. There is some mild atrophy along the lower aspect of the right kidney. No restricted diffusion in the kidney. Stomach/Bowel: As seen on CT scan there are some dilated fluid-filled loops of small bowel left midabdomen. The visualized colon is nondilated. The stomach is nondilated. Vascular/Lymphatic: Normal caliber aorta and IVC. There are several small less than 1 cm in size retroperitoneal and mesenteric nodes. Not pathologic by size criteria but more numerous than usually seen. Other: Scattered ascites. Several mesenteric nodules identified. This includes the central mesenteric lesion seen previously immediately anterior to the third portion of the duodenal on the right side measuring 2.9 by 2.6 cm. Several other areas identified as well. Again these were seen on the prior CT. Some of the lesions could be mesenteric lymph nodes versus peritoneal nodules. The larger complex mass in the pelvis is not included in the imaging field on today's examination for the abdomen Musculoskeletal: Curvature of the spine. Mild degenerative changes. Anasarca. IMPRESSION: Solitary segment 7 hepatic metastasis.  Target like lesion. Peritoneal nodules identified as seen on prior examination with the ascites. Please correlate for peritoneal spread of disease on the prior CT scan. Persistent dilatation of the loops of small bowel in the midabdomen. Electronically Signed   By: Karen Kays M.D.   On: 07/03/2023 15:33   CT CHEST WO CONTRAST  Result Date: 07/03/2023 CLINICAL DATA:  Colon cancer staging * Tracking Code: BO * EXAM: CT CHEST WITHOUT CONTRAST TECHNIQUE: Multidetector CT imaging of the chest was performed following the standard protocol without IV contrast. RADIATION DOSE REDUCTION: This exam was performed according to the departmental dose-optimization  program which includes automated exposure control, adjustment of the mA and/or kV according to patient size and/or use of iterative reconstruction technique. COMPARISON:  CT abdomen pelvis, 06/22/2023 FINDINGS: Cardiovascular: Right upper extremity PICC. Normal heart size. No pericardial effusion. Mediastinum/Nodes: No enlarged mediastinal, hilar, or axillary lymph nodes. Thyroid gland, trachea, and esophagus demonstrate no significant findings. Lungs/Pleura: Bandlike scarring and atelectasis of the dependent right upper lobe (series 8, image 45) and dependent right lower lobe (series 8, image 81). Mild, bandlike scarring of the left lung base. No pleural effusion or pneumothorax. Upper Abdomen: No acute abnormality. Previously described hypodense liver lesion is not clearly assessed on this noncontrast examination of the chest. Musculoskeletal: No chest wall abnormality. No acute osseous findings. IMPRESSION: 1. No evidence of metastatic disease in the chest. 2. Bandlike scarring and atelectasis of the dependent right upper lobe and dependent right lower lobe. Mild, bandlike scarring of the left lung base. Electronically Signed   By: Jearld Lesch M.D.   On: 07/03/2023  14:07   CT BIOPSY  Result Date: 06/24/2023 INDICATION: 57 year old with peritoneal lesions and needs a tissue diagnosis. EXAM: CT-GUIDED CORE BIOPSY OF A PERITONEAL NODULE MEDICATIONS: Moderate sedation ANESTHESIA/SEDATION: Moderate (conscious) sedation was employed during this procedure. A total of Versed 1.0 mg and Fentanyl 50 mcg was administered intravenously by the radiology nurse. Total intra-service moderate Sedation Time: 18 minutes. The patient's level of consciousness and vital signs were monitored continuously by radiology nursing throughout the procedure under my direct supervision. FLUOROSCOPY TIME:  None COMPLICATIONS: None immediate. PROCEDURE: Informed written consent was obtained from the patient after a thorough discussion of the  procedural risks, benefits and alternatives. All questions were addressed. A timeout was performed prior to the initiation of the procedure. Patient was placed supine on the CT scanner. Images through the abdomen were obtained. An anterior midline peritoneal nodule was identified near the umbilicus. The right side of the abdomen was prepped with chlorhexidine and sterile field was created. Skin was anesthetized using 1% lidocaine. Using CT guidance, 17 gauge coaxial needle was directed into the peritoneal nodule. Needle position was confirmed within the nodule. Total of 3 core biopsies were obtained with an 18 gauge device. Specimens placed in formalin. Needle was removed and follow up CT images were obtained. Bandage placed over the puncture site. RADIATION DOSE REDUCTION: This exam was performed according to the departmental dose-optimization program which includes automated exposure control, adjustment of the mA and/or kV according to patient size and/or use of iterative reconstruction technique. FINDINGS: CT images demonstrate diffuse dilatation of small bowel loops compatible with small bowel obstruction and findings are similar to the previous diagnostic CT. Peritoneal nodule in the anterior midline just posterior to the umbilicus was targeted. Coaxial needle was confirmed within the nodule. Three adequate core specimens were obtained. IMPRESSION: CT-guided core biopsy of an anterior abdominal peritoneal nodule. Electronically Signed   By: Richarda Overlie M.D.   On: 06/24/2023 16:50   DG Fluoro Rm 1-60 Min - No Report  Result Date: 06/24/2023 Fluoroscopy was utilized by the requesting physician.  No radiographic interpretation.   [place] EKG SITE RITE  Result Date: 06/23/2023 If Site Rite image not attached, placement could not be confirmed due to current cardiac rhythm.  [place] EKG SITE RITE  Result Date: 06/23/2023 If Site Rite image not attached, placement could not be confirmed due to current cardiac  rhythm.  CT ABDOMEN PELVIS W CONTRAST  Result Date: 06/22/2023 CLINICAL DATA:  Epigastric pain. Indeterminate right liver lesion on ultrasound. * Tracking Code: BO * EXAM: CT ABDOMEN AND PELVIS WITH CONTRAST TECHNIQUE: Multidetector CT imaging of the abdomen and pelvis was performed using the standard protocol following bolus administration of intravenous contrast. RADIATION DOSE REDUCTION: This exam was performed according to the departmental dose-optimization program which includes automated exposure control, adjustment of the mA and/or kV according to patient size and/or use of iterative reconstruction technique. CONTRAST:  OMNIPAQUE IOHEXOL 300 MG/ML  SOLN COMPARISON:  Abdominal ultrasound 06/21/2023 and 10/29/2022. FINDINGS: Lower chest: Small right pleural effusion with dependent right lower lobe atelectasis. The left lung base is clear. Hepatobiliary: The liver is normal in density without definite signs of steatosis or morphologic changes of cirrhosis. There is an indeterminate hypodense lesion posteriorly in the right lobe measuring 1.3 x 1.3 cm on image 26/2, corresponding with the ultrasound finding. Although nonspecific by this examination, this is worrisome for metastatic disease based on additional findings described below. Possible additional tiny lesion more inferiorly in the right lobe  on image 34/2. No evidence of gallstones, gallbladder wall thickening or biliary dilatation. Pancreas: Unremarkable. No pancreatic ductal dilatation or surrounding inflammatory changes. Spleen: Normal in size without focal abnormality. Adrenals/Urinary Tract: Both adrenal glands appear normal. No evidence of urinary tract calculus, suspicious renal lesion or hydronephrosis. Mild renal cortical scarring on the right. The bladder appears unremarkable for its degree of distention. Stomach/Bowel: Enteric contrast was administered and has passed into the mid small bowel. The stomach and proximal small bowel appear  normal. There are multiple moderately dilated loops of mid to distal small bowel with air-levels consistent with a distal small-bowel obstruction. Transition point is in the right lower quadrant and likely associated with a complex mass measuring approximately 4.7 x 3.0 cm on image 79/2. The distal small bowel is decompressed. The appendix is not well visualized, although demonstrates no definite abnormality. The colon is decompressed without wall thickening or focal abnormality. There are mild diverticular changes throughout the descending and sigmoid colon. Vascular/Lymphatic: 2.9 x 2.2 cm mesenteric nodule on image 50/2 may reflect an enlarged lymph node. No other enlarged abdominal or pelvic lymph nodes. No significant vascular findings. No evidence of aneurysm or large vessel occlusion. Reproductive: The endometrium appears mildly thickened and heterogeneous, measuring up to 1.5 cm in thickness on sagittal image 66/6. No definite myometrial abnormality. There is a large heterogeneous mass superior to the cervix which measures 7.9 x 6.1 cm on image 81/2. This is not clearly related to either ovary. The right lower quadrant mass described above causing apparent distal small bowel obstruction may be related to the right ovary. No left ovarian mass identified. Other: No evidence of abdominal wall hernia, generalized ascites or pneumoperitoneum. There are multiple peritoneal nodules, highly suspicious for carcinomatosis. Representative lesions include a 2.1 cm lesion deep to the umbilicus on image 70/2, a 3.1 x 2.4 cm lesion in the right lower quadrant on image 71/2 and anterior left upper quadrant lesion measuring 2.0 x 1.4 cm lesion on image 53/2. Enhancing nodule measuring 2.8 cm in the left mid abdomen is associated with an adjacent cystic structure measuring up to 8.5 x 7.3 cm on image 64/2, possibly an area of loculated ascites. Additional pelvic masses are described above. Musculoskeletal: No acute or  significant osseous findings. Mild degenerative changes of both hips. Unless specific follow-up recommendations are mentioned in the findings or impression sections, no imaging follow-up of any mentioned incidental findings is recommended. IMPRESSION: 1. Distal small-bowel obstruction with transition point in the right lower quadrant, likely associated with a complex mass in the right lower quadrant. Patient may benefit from nasogastric tube decompression and general surgical consultation. 2. Multiple peritoneal nodules, highly suspicious for carcinomatosis. Dominant heterogeneous mass superior to the cervix is not clearly related to either ovary and could reflect a drop metastasis or atypical primary pelvic malignancy. Associated mildly thickened and heterogeneous endometrium. No definite other primary malignancy identified in the abdomen or pelvis. 3. Indeterminate hypodense lesion posteriorly in the right lobe of the liver, corresponding with the ultrasound finding, worrisome for metastatic disease based on the other findings. 4. Small right pleural effusion with dependent right lower lobe atelectasis. 5. Appropriate oncology referral recommended for additional diagnosis and staging. PET-CT may be helpful. Electronically Signed   By: Carey Bullocks M.D.   On: 06/22/2023 15:02   US ABDOMEN LIMITED RUQ (LIVER/GB)  Result Date: 06/21/2023 CLINICAL DATA:  Epigastric pain for 3 days EXAM: ULTRASOUND ABDOMEN LIMITED RIGHT UPPER QUADRANT COMPARISON:  10/29/2022 FINDINGS: Gallbladder: No gallstones  or wall thickening visualized. No sonographic Murphy sign noted by sonographer. Common bile duct: Diameter: 3 mm Liver: Diffuse increased liver echotexture compatible with hepatic steatosis. There is a focal area of fatty sparing near the gallbladder fossa. Within the right lobe liver there is an indistinct 2.5 x 2.3 x 1.8 cm hypoechoic region, nonspecific. No intrahepatic duct dilation. Portal vein is patent on color  Doppler imaging with normal direction of blood flow towards the liver. Other: Small right pleural effusion incidentally noted. IMPRESSION: 1. Hepatic steatosis, with focal fatty sparing near the gallbladder fossa. 2. Indeterminate 2.5 cm hypoechoic region within the right lobe liver, not seen on prior imaging. When the patient is clinically stable and able to follow directions and hold their breath (preferably as an outpatient) further evaluation with dedicated abdominal MRI should be considered. 3. Right pleural effusion. Electronically Signed   By: Sharlet Salina M.D.   On: 06/21/2023 16:40    Microbiology: Results for orders placed or performed during the hospital encounter of 06/20/23  Aerobic/Anaerobic Culture w Gram Stain (surgical/deep wound)     Status: None   Collection Time: 07/11/23 10:48 AM   Specimen: Abdomen; Abscess  Result Value Ref Range Status   Specimen Description   Final    ABDOMEN ABSCESS Performed at [facility], [address] [place], [place] [address]    Special Requests   Final    NONE Performed at [facility], [address], [place], [place] [address]    Gram Stain   Final    ABUNDANT WBC PRESENT, PREDOMINANTLY PMN MODERATE GRAM POSITIVE COCCI IN PAIRS MODERATE GRAM POSITIVE RODS FEW GRAM NEGATIVE RODS    Culture   Final    MODERATE ESCHERICHIA COLI MODERATE LACTOBACILLUS SPECIES Standardized susceptibility testing for this organism is not available. MODERATE BACTEROIDES SPECIES NOT FRAGILIS BETA LACTAMASE POSITIVE Performed at [facility], [address] [place], [place] [address]    Report Status 07/13/2023 FINAL  Final   Organism ID, Bacteria ESCHERICHIA COLI  Final      Susceptibility   Escherichia coli - MIC*    AMPICILLIN <=2 SENSITIVE Sensitive     CEFEPIME <=0.12 SENSITIVE Sensitive     CEFTAZIDIME <=1 SENSITIVE Sensitive     CEFTRIAXONE <=0.25 SENSITIVE Sensitive     CIPROFLOXACIN <=0.25  SENSITIVE Sensitive     GENTAMICIN <=1 SENSITIVE Sensitive     IMIPENEM <=0.25 SENSITIVE Sensitive     TRIMETH/SULFA <=20 SENSITIVE Sensitive     AMPICILLIN/SULBACTAM <=2 SENSITIVE Sensitive     PIP/TAZO <=4 SENSITIVE Sensitive     * MODERATE ESCHERICHIA COLI    Labs: CBC: Recent Labs  Lab 07/10/23 0421 07/11/23 0417 07/12/23 0444 07/13/23 0401 07/14/23 0322 07/15/23 0337  WBC 18.0* 12.3* 14.2* 14.4* 12.8* 11.8*  NEUTROABS 15.5* 9.9* 12.0*  --   --   --   HGB 7.7* 7.3* 7.7* 7.8* 8.0* 8.0*  HCT 26.4* 25.9* 27.7* 27.7* 27.8* 27.7*  MCV 85.7 86.3 87.4 85.8 85.5 86.8  PLT 411* 383 419* 429* 466* 509*   Basic Metabolic Panel: Recent Labs  Lab 07/10/23 0421 07/11/23 0417 07/12/23 0444 07/13/23 0401 07/14/23 0322 07/15/23 0337  NA 133* 133* 136 136 133* 136  K 3.7 3.7 4.0 3.9 3.9 3.7  CL 101 103 102 102 100 103  CO2 25 21* 23 26 24 24   GLUCOSE 115* 124* 141* 160* 147* 113*  BUN 7 7 8 9 9 9   CREATININE 0.75 0.65 0.65  0.67 0.64 0.45  CALCIUM 7.8* 7.5* 8.1* 8.2* 8.3* 8.4*  MG 2.2  --  2.2 2.1 2.1  --   PHOS 3.6  --  2.6 2.9 3.0  --    Liver Function Tests: Recent Labs  Lab 07/10/23 0421 07/14/23 0322  AST 41 74*  ALT 29 69*  ALKPHOS 125 222*  BILITOT 0.4 0.5  PROT 6.5 7.5  ALBUMIN 1.9* 2.1*   CBG: Recent Labs  Lab 07/14/23 1334 07/14/23 1800 07/14/23 2334 07/15/23 0559 07/15/23 1124  GLUCAP 117* 112* 129* 108* 139*    Discharge time spent: greater than 30 minutes.  Signed: Jonah Blue, MD [facility] 07/16/2023

## 2023-07-16 NOTE — Progress Notes (Signed)
Discharge meds delivered to patient in room - no questions at this time .

## 2023-07-16 NOTE — Progress Notes (Addendum)
Progress Note  16 Days Post-Op  Subjective: Pt tolerating diet, having bowel function. Drain output minimal. Pain well controlled with PO medications.    Objective: Vital signs in last 24 hours: Temp:  [97.7 F (36.5 C)-98.5 F (36.9 C)] 98.5 F (36.9 C) (09/25 1204) Pulse Rate:  [63-78] 78 (09/25 1204) Resp:  [16-20] 18 (09/25 0523) BP: (133-161)/(73-80) 138/74 (09/25 1204) SpO2:  [97 %-100 %] 98 % (09/25 1204) Weight:  [134.5 kg] 134.5 kg (09/25 0500) Last BM Date : 07/16/23  Intake/Output from previous day: 09/24 0701 - 09/25 0700 In: 1448 [P.O.:236; I.V.:952.1; IV Piggyback:249.9] Out: 10 [Drains:10] Intake/Output this shift: No intake/output data recorded.  PE: General: pleasant, WD, obese female who is laying in bed in NAD Heart: regular, rate, and rhythm.  Lungs:  Respiratory effort nonlabored Abd: soft, appropriately ttp, mild distention, drain with ss fluid, remaining staples removed from incision, open portion inferiorly with small amount drainage and healthy granulation  Psych: A&Ox3 with an appropriate affect.    Lab Results:  Recent Labs    07/14/23 0322 07/15/23 0337  WBC 12.8* 11.8*  HGB 8.0* 8.0*  HCT 27.8* 27.7*  PLT 466* 509*   BMET Recent Labs    07/14/23 0322 07/15/23 0337  NA 133* 136  K 3.9 3.7  CL 100 103  CO2 24 24  GLUCOSE 147* 113*  BUN 9 9  CREATININE 0.64 0.45  CALCIUM 8.3* 8.4*   PT/INR No results for input(s): "LABPROT", "INR" in the last 72 hours. CMP     Component Value Date/Time   NA 136 07/15/2023 0337   NA 141 05/16/2022 1044   K 3.7 07/15/2023 0337   CL 103 07/15/2023 0337   CO2 24 07/15/2023 0337   GLUCOSE 113 (H) 07/15/2023 0337   BUN 9 07/15/2023 0337   BUN 9 05/16/2022 1044   CREATININE 0.45 07/15/2023 0337   CALCIUM 8.4 (L) 07/15/2023 0337   PROT 7.5 07/14/2023 0322   PROT 7.8 05/16/2022 1044   ALBUMIN 2.1 (L) 07/14/2023 0322   ALBUMIN 4.4 05/16/2022 1044   AST 74 (H) 07/14/2023 0322   ALT 69  (H) 07/14/2023 0322   ALKPHOS 222 (H) 07/14/2023 0322   BILITOT 0.5 07/14/2023 0322   BILITOT <0.2 05/16/2022 1044   GFRNONAA >60 07/15/2023 0337   Lipase     Component Value Date/Time   LIPASE 35 06/20/2023 1436       Studies/Results: No results found.  Anti-infectives: Anti-infectives (From admission, onward)    Start     Dose/Rate Route Frequency Ordered Stop   07/15/23 2200  amoxicillin-clavulanate (AUGMENTIN) 875-125 MG per tablet 1 tablet        1 tablet Oral Every 12 hours 07/15/23 1416     07/09/23 1600  piperacillin-tazobactam (ZOSYN) IVPB 3.375 g  Status:  Discontinued        3.375 g 12.5 mL/hr over 240 Minutes Intravenous Every 8 hours 07/09/23 1510 07/15/23 1416   06/30/23 0830  ceFAZolin (ANCEF) IVPB 2g/100 mL premix        2 g 200 mL/hr over 30 Minutes Intravenous On call to O.R. 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 Mosetta Putt  is following Port placed 9/17   POD16 s/p ex-lap, SBR and biopsy of multiple sites 9/10 Dr. Fredricka Bonine   - incision clean - ok to change once daily at this point, can remove remaining staples prior to DC - tolerating soft diet - drain placed in RLQ abscess 9/20 - pan sensitive E.coli  - drain study today to see if drain could be removed prior to DC - stable for DC from surgery standpoint will send Rx for pain meds and abx  FEN: soft diet VTE: SQH ID: Zosyn 9/18>9/24; PO augmentin 9/24>>   LOS: 26 days     Juliet Rude, [facility] 07/16/2023, 12:10 PM Please see Amion for pager number during day hours 7:00am-4:30pm   Seen and agree Carman Ching, MD

## 2023-07-17 ENCOUNTER — Telehealth: Payer: Self-pay | Admitting: Hematology

## 2023-07-17 ENCOUNTER — Telehealth: Payer: Self-pay

## 2023-07-17 DIAGNOSIS — C172 Malignant neoplasm of ileum: Secondary | ICD-10-CM | POA: Diagnosis not present

## 2023-07-17 NOTE — Transitions of Care (Post Inpatient/ED Visit) (Signed)
07/17/2023  Name: Linda [name] MRN: [id number] DOB: [birth year]  Today's TOC FU Call Status: Today's TOC FU Call Status:: Successful TOC FU Call Completed TOC FU Call Complete Date: 07/16/23 Patient's Name and Date of Birth confirmed.  Transition Care Management Follow-up Telephone Call Date of Discharge: 07/17/23 Discharge Facility: [name]) Type of Discharge: Inpatient Admission Primary Inpatient Discharge Diagnosis:: Anemia, unspecified How have you been since you were released from the hospital?: Better (She stated she was feeling pretty good Alert .responsive , She had been able to eat had BM no concerns, taking medication as as ordered, Dtr was performing wound care) Any questions or concerns?: No  Items Reviewed: Did you receive and understand the discharge instructions provided?: Yes (Reviewed medication changes, wound care amd upcoming appts) Medications obtained,verified, and reconciled?: Partial Review Completed Reason for Partial Mediation Review: Multiple med changes , she did not have full list Any new allergies since your discharge?: No Dietary orders reviewed?: Yes Type of Diet Ordered:: Currently following [place] diet, miminal carb control Do you have support at home?: Yes People in Home: child(ren), adult (She moved in with her daughter [name]) Name of Support/Comfort Primary Source: [name]  Medications Reviewed Today: Medications Reviewed Today     Reviewed by Johnnette Barrios, RN (Registered Nurse) on 07/17/23 at 1252  Med List Status: <None>   Medication Order Taking? Sig Documenting Provider Last Dose Status Informant  acetaminophen (TYLENOL) 500 MG tablet [id number]  Take 2 tablets (1,000 mg total) by mouth every 8 (eight) hours as needed for mild pain or fever. Juliet Rude, PA-C  Active   amoxicillin-clavulanate (AUGMENTIN) 875-125 MG tablet [id number] Yes Take 1 tablet by mouth every 12 (twelve) hours for 7 days. Juliet Rude, PA-C  Taking Active   methocarbamol (ROBAXIN) 500 MG tablet [id number]  Take 2 tablets (1,000 mg total) by mouth every 8 (eight) hours as needed for muscle spasms. Juliet Rude, PA-C  Active   oxyCODONE (OXY IR/ROXICODONE) 5 MG immediate release tablet [id number] Yes Take 1-2 tablets (5-10 mg total) by mouth every 4 (four) hours as needed for moderate pain or severe pain (5mg  moderate, 10mg  severe). Juliet Rude, PA-C Taking Active   pantoprazole (PROTONIX) 40 MG tablet [id number]  Take 1 tablet (40 mg total) by mouth 2 (two) times daily. Jonah Blue, MD  Active   polyethylene glycol powder [facility]) 17 GM/SCOOP powder [id number] Yes Take 17 g by mouth daily mixed in liquid as directed. Jonah Blue, MD Taking Active   simethicone [facility]) 80 MG chewable tablet [id number]  Chew 1 tablet (80 mg total) by mouth 4 (four) times daily as needed for flatulence. Jonah Blue, MD  Active             Home Care and Equipment/Supplies: Were Home Health Services Ordered?: No Any new equipment or medical supplies ordered?: No  Functional Questionnaire: Do you need assistance with bathing/showering or dressing?: No Do you need assistance with meal preparation?: No Do you need assistance with eating?: No Do you have difficulty maintaining continence: No Do you need assistance with getting out of bed/getting out of a chair/moving?: No Do you have difficulty managing or taking your medications?: No  Follow up appointments reviewed: PCP Follow-up appointment confirmed?: No (She has multiple upcoming appts . Her daughter is scheduling Post discharge appt with PCP along with multiple specialist follow-ups . Offered x 2 to schedule appt and offer was declined) MD Provider Line Number:[phone] Given: No [facility]  Follow-up appointment confirmed?: No (She has multiple upcoming appts . Her daughter is scheduling  appts for her) Do you need transportation to your follow-up  appointment?: No Do you understand care options if your condition(s) worsen?: Yes-patient verbalized understanding  SDOH Interventions Today    Flowsheet Row Most Recent Value  SDOH Interventions   Housing Interventions Intervention Not Indicated  Transportation Interventions Intervention Not Indicated       Goals Addressed             This Visit's Progress    TOC Care Plan       Current Barriers:  Care Coordination needs related to multiple changes in medications and new abdominal wound    RNCM Clinical Goal(s):  Patient will  through collaboration with RN Care manager, provider, and care team.  demonstrate ongoing self health care management ability with medication management ,symptom control and no infections r/t abdominal wound   through collaboration with RN Care manager, provider, and care team.   Interventions: Evaluation of current treatment plan related to  self management and patient's adherence to plan as established by provider     (Status:  New goal.)  Short Term Goal Evaluation of current treatment plan related to  wound care   self-management and patient's adherence to plan as established by provider. Discussed plans with patient for ongoing care management follow up and provided patient with direct contact information for care management team Provided education to patient re: supplies need and wound care r/t dressing changes s/s of infections and when to call CM and or Provider Reviewed scheduled/upcoming provider appointments including multiple specialist appts ( Oncology, Surgical f/u) Discussed plans with patient for ongoing care management follow up and provided patient with direct contact information for care management team  Pain Interventions:  (Status:  New goal.) Short Term Goal Pain assessment performed Medications reviewed Reviewed provider established plan for pain management Discussed importance of adherence to all scheduled medical  appointments Counseled on the importance of reporting any/all new or changed pain symptoms or management strategies to pain management provider Reviewed with patient prescribed pharmacological and nonpharmacological pain relief strategies  Patient Goals/Self-Care Activities: Take all medications as prescribed Attend all scheduled provider appointments Call pharmacy for medication refills 3-7 days in advance of running out of medications  Follow Up Plan:  Telephone follow up appointment with care management team member scheduled for:  07/24/23 @ 1:00pm            Susa Loffler , BSN, RN Care Management Coordinator [place]   [facility] [email or web address] Direct Dial: [phone]

## 2023-07-22 ENCOUNTER — Encounter: Payer: Self-pay | Admitting: Hematology

## 2023-07-22 ENCOUNTER — Other Ambulatory Visit (HOSPITAL_COMMUNITY): Payer: Self-pay

## 2023-07-22 ENCOUNTER — Inpatient Hospital Stay: Payer: BC Managed Care – PPO

## 2023-07-22 ENCOUNTER — Inpatient Hospital Stay: Payer: BC Managed Care – PPO | Attending: Hematology | Admitting: Hematology

## 2023-07-22 VITALS — BP 134/86 | HR 85 | Temp 98.9°F | Resp 17 | Ht 64.0 in | Wt 181.7 lb

## 2023-07-22 DIAGNOSIS — C172 Malignant neoplasm of ileum: Secondary | ICD-10-CM | POA: Diagnosis not present

## 2023-07-22 DIAGNOSIS — C786 Secondary malignant neoplasm of retroperitoneum and peritoneum: Secondary | ICD-10-CM | POA: Diagnosis not present

## 2023-07-22 DIAGNOSIS — C179 Malignant neoplasm of small intestine, unspecified: Secondary | ICD-10-CM | POA: Diagnosis not present

## 2023-07-22 DIAGNOSIS — R569 Unspecified convulsions: Secondary | ICD-10-CM | POA: Diagnosis not present

## 2023-07-22 DIAGNOSIS — C787 Secondary malignant neoplasm of liver and intrahepatic bile duct: Secondary | ICD-10-CM | POA: Insufficient documentation

## 2023-07-22 DIAGNOSIS — R7303 Prediabetes: Secondary | ICD-10-CM | POA: Diagnosis not present

## 2023-07-22 DIAGNOSIS — Z5111 Encounter for antineoplastic chemotherapy: Secondary | ICD-10-CM | POA: Insufficient documentation

## 2023-07-22 DIAGNOSIS — D509 Iron deficiency anemia, unspecified: Secondary | ICD-10-CM | POA: Diagnosis not present

## 2023-07-22 DIAGNOSIS — Z79899 Other long term (current) drug therapy: Secondary | ICD-10-CM | POA: Diagnosis not present

## 2023-07-22 DIAGNOSIS — D500 Iron deficiency anemia secondary to blood loss (chronic): Secondary | ICD-10-CM | POA: Diagnosis not present

## 2023-07-22 DIAGNOSIS — Z95828 Presence of other vascular implants and grafts: Secondary | ICD-10-CM

## 2023-07-22 LAB — MISCELLANEOUS TEST

## 2023-07-22 MED ORDER — SODIUM CHLORIDE 0.9% FLUSH
10.0000 mL | Freq: Once | INTRAVENOUS | Status: AC
  Administered 2023-07-22: 10 mL

## 2023-07-22 MED ORDER — OXYCODONE HCL 5 MG PO TABS
5.0000 mg | ORAL_TABLET | Freq: Four times a day (QID) | ORAL | 0 refills | Status: DC | PRN
Start: 2023-07-22 — End: 2024-02-25
  Filled 2023-07-22: qty 60, 8d supply, fill #0

## 2023-07-22 MED ORDER — LIDOCAINE-PRILOCAINE 2.5-2.5 % EX CREA
TOPICAL_CREAM | CUTANEOUS | 3 refills | Status: DC
  Filled 2023-07-22: qty 30, 5d supply, fill #0

## 2023-07-22 MED ORDER — HEPARIN SOD (PORK) LOCK FLUSH 100 UNIT/ML IV SOLN
500.0000 [IU] | Freq: Once | INTRAVENOUS | Status: AC
  Administered 2023-07-22: 500 [IU]

## 2023-07-22 MED ORDER — PROCHLORPERAZINE MALEATE 10 MG PO TABS
10.0000 mg | ORAL_TABLET | Freq: Four times a day (QID) | ORAL | 1 refills | Status: DC | PRN
  Filled 2023-07-22: qty 30, 8d supply, fill #0

## 2023-07-22 MED ORDER — ONDANSETRON HCL 8 MG PO TABS
8.0000 mg | ORAL_TABLET | Freq: Three times a day (TID) | ORAL | 1 refills | Status: DC | PRN
Start: 2023-07-22 — End: 2023-08-20
  Filled 2023-07-22: qty 9, 3d supply, fill #0

## 2023-07-22 NOTE — Progress Notes (Signed)
START ON PATHWAY REGIMEN - Colorectal     A cycle is every 14 days:     Oxaliplatin      Leucovorin      Fluorouracil      Fluorouracil   **Always confirm dose/schedule in your pharmacy ordering system**  Patient Characteristics: Distant Metastases, Nonsurgical Candidate, Non-KRAS G12C, RAS Mutation Positive/Unknown (BRAF V600 Wild-Type/Unknown), Standard Cytotoxic Therapy, First Line Standard Cytotoxic Therapy, Bevacizumab Ineligible, PS = 0,1 Tumor Location: Colon Therapeutic Status: Distant Metastases Microsatellite/Mismatch Repair Status: MSS/pMMR BRAF Mutation Status: Awaiting Test Results KRAS/NRAS Mutation Status: Awaiting Test Results Preferred Therapy Approach: Standard Cytotoxic Therapy Standard Cytotoxic Line of Therapy: First Line Standard Cytotoxic Therapy ECOG Performance Status: 1 Bevacizumab Eligibility: Ineligible Intent of Therapy: Non-Curative / Palliative Intent, Discussed with Patient

## 2023-07-22 NOTE — Progress Notes (Signed)
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  [name] in the liver MRI also showed a 1.8 cm oligo met in segment 7 of liver. -We discussed the incurable nature of her metastatic disease, due to her metastasis in peritoneum and liver.  We also discussed the small possibility of HIPEC and liver directed therapy if she has excellent response to chemotherapy. -I recommend first-line chemotherapy FOLFOX.  She is not a candidate for first-line immunotherapy given MMR normal.  I will order NexGen or sequencing Tempus to see if she is a candidate for targeted therapy.  Given the primary tumor in terminal ileum, there is no benefit of VEGFR inhibitor even if her tumor is wild type of KRAS/NRAS/BRAF. Benefit of bevacizumab is also uncertain given the primary tumor in small intestine.     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 Noted weight loss, decreased appetite, and loose bowel movements. Pain managed with Oxycodone. -Continue current antibiotics for 3 more days. -Encourage high protein, high calorie diet and use of Ensure or smoothies between meals. -Start Pantoprazole  (Protonix) before meals to manage potential acid reflux. -Refill Oxycodone prescription (60 tablets) for pain management. -Plan to start chemotherapy (FOLFOX regimen) in one week. --Chemotherapy consent: Side effects including but does not not limited to, fatigue, nausea, vomiting, diarrhea, hair loss, neuropathy, fluid retention, renal and kidney dysfunction, neutropenic fever, needed for blood transfusion, bleeding, were discussed with patient in great detail. She agrees to proceed. -The goal of chemotherapy is palliative to prolong her life and improve her cancer related symptoms -Attend chemotherapy class one week from today to understand treatment process and side effects. -Repeat scan in 2-3 months to evaluate treatment response. -She still has a open wound at his incision, likely will heal in the next few weeks  Iron Deficiency Anemia Likely secondary to cancer  -She did not tolerate oral iron, received IV iron a total of 1 g in the hospital -Check iron levels next week. -Will monitor her iron level  General Health Maintenance / Followup Plans -Appointment with Dr. Doylene Canard on 9th and PCP on 11th. -Referral to dietician for nutritional support. -Consider counseling for emotional support during cancer treatment.  Will refer her to social worker -Repeat blood count next week. -Monitor tumor markers.      Plan -Plan to start first-line chemotherapy FOLFOX on October 10, will schedule chemo class -Will order Tempus on her surgical sample   SUMMARY OF ONCOLOGIC HISTORY: Oncology History  Cancer of ileum with carcinomatosis  07/10/2023 Initial Diagnosis   Cancer of ileum with carcinomatosis   07/22/2023 Cancer Staging   Staging form: Small Intestine - Adenocarcinoma, AJCC 8th Edition - Pathologic: Stage IV (pT4, pNX, pM1) - Signed by Malachy Mood, MD on 07/22/2023 Histologic grade (G):  G2 Histologic grading system: 4 grade system   07/31/2023 -  Chemotherapy   Patient is on  Treatment Plan : COLORECTAL FOLFOX q14d        Discussed the use of AI scribe software for clinical note transcription with the patient, who gave verbal consent to proceed.  History of Present Illness   The patient, a 57 year old individual, presents for a follow-up visit after recent hospital discharge. The patient reports feeling fine and has been recovering at home with the help of a caregiver. The patient had a draining tube which was removed in the hospital and is currently on antibiotics, which are due to finish in three days. The patient reports no current pain, but experienced some discomfort after physical activity. The patient's appetite is reduced, but she is making an effort to consume protein and eat three times a day. The patient has lost almost thirty pounds since her bowel surgery and is experiencing loose bowel movements several times a day. The patient is also prediabetic and has been monitoring her sugar levels. The patient's incision from surgery is still open and is being dressed by the caregiver until it fully heals.         All other systems were reviewed with the patient and are negative.  MEDICAL HISTORY:  Past Medical History:  Diagnosis Date   Anemia    Hypertension    Pre-diabetes 02/2022    SURGICAL HISTORY: Past Surgical History:  Procedure Laterality Date   BREAST LUMPECTOMY WITH RADIOACTIVE SEED LOCALIZATION Left 06/10/2022   Procedure: LEFT BREAST LUMPECTOMY WITH RADIOACTIVE SEED LOCALIZATION;  Surgeon: Griselda Miner, MD;  Location:  SURGERY CENTER;  Service: General;  Laterality: Left;   CESAREAN SECTION     x4   IR IMAGING GUIDED PORT INSERTION  07/08/2023   LAPAROTOMY N/A 06/30/2023   Procedure: EXPLORATORY LAPAROTOMY, SMALL BOWEL RESECTION, AND EXCISIONAL BIOPSIES OF MULTIPLE OMENTAL MASSES;  Surgeon: Berna Bue, MD;  Location: [facility];  Service: General;  Laterality: N/A;   TUBAL LIGATION      I have reviewed the social history  and family history with the patient and they are unchanged from previous note.  ALLERGIES:  has No Known Allergies.  MEDICATIONS:  Current Outpatient Medications  Medication Sig Dispense Refill   acetaminophen (TYLENOL) 500 MG tablet Take 2 tablets (1,000 mg total) by mouth every 8 (eight) hours as needed for mild pain or fever.     amoxicillin-clavulanate (AUGMENTIN) 875-125 MG tablet Take 1 tablet by mouth every 12 (twelve) hours for 7 days. 14 tablet 0   lidocaine-prilocaine (EMLA) cream Apply to affected area once 30 g 3   methocarbamol (ROBAXIN) 500 MG tablet Take 2 tablets (1,000 mg total) by mouth every 8 (eight) hours as needed for muscle spasms. 30 tablet 0   ondansetron (ZOFRAN) 8 MG tablet Take 1 tablet (8 mg total) by mouth every 8 (eight) hours as needed for nausea or vomiting. Start on the third day after chemotherapy. 30 tablet 1   oxyCODONE (OXY IR/ROXICODONE) 5 MG immediate release tablet Take 1-2 tablets (5-10 mg total) by mouth every 6 (six) hours as needed for moderate pain or severe pain (5mg  moderate, 10mg  severe). 60 tablet 0   pantoprazole (PROTONIX) 40 MG tablet Take 1 tablet (40 mg total) by mouth 2 (two) times daily. 60 tablet 1   polyethylene glycol powder (GLYCOLAX/MIRALAX) 17 GM/SCOOP powder Take 17 g by mouth daily mixed in liquid as directed. 238 g 0  prochlorperazine (COMPAZINE) 10 MG tablet Take 1 tablet (10 mg total) by mouth every 6 (six) hours as needed for nausea or vomiting. 30 tablet 1   simethicone (MYLICON) 80 MG chewable tablet Chew 1 tablet (80 mg total) by mouth 4 (four) times daily as needed for flatulence. 100 tablet 0   No current facility-administered medications for this visit.    PHYSICAL EXAMINATION: ECOG PERFORMANCE STATUS: 2 - Symptomatic, <50% confined to bed  Vitals:   07/22/23 0948  BP: 134/86  Pulse: 85  Resp: 17  Temp: 98.9 F (37.2 C)  SpO2: 100%   Wt Readings from Last 3 Encounters:  07/22/23 181 lb 11.2 oz (82.4 kg)   07/16/23 296 lb 8 oz (134.5 kg)  03/05/23 206 lb 9.6 oz (93.7 kg)     GENERAL:alert, no distress and comfortable SKIN: skin color, texture, turgor are normal, no rashes or significant lesions EYES: normal, Conjunctiva are pink and non-injected, sclera clear NECK: supple, thyroid normal size, non-tender, without nodularity LYMPH:  no palpable lymphadenopathy in the cervical, axillary  LUNGS: clear to auscultation and percussion with normal breathing effort HEART: regular rate & rhythm and no murmurs and no lower extremity edema ABDOMEN:abdomen soft, non-tender and normal bowel sounds. Incision site with dressing, depth approximately half inch, indicating healing. No tenderness on palpation around incision or drain tube site. Staples removed.  Musculoskeletal:no cyanosis of digits and no clubbing  NEURO: alert & oriented x 3 with fluent speech, no focal motor/sensory deficits       LABORATORY DATA:  I have reviewed the data as listed    Latest Ref Rng & Units 07/15/2023    3:37 AM 07/14/2023    3:22 AM 07/13/2023    4:01 AM  CBC  WBC 4.0 - 10.5 K/uL 11.8  12.8  14.4   Hemoglobin 12.0 - 15.0 g/dL 8.0  8.0  7.8   Hematocrit 36.0 - 46.0 % 27.7  27.8  27.7   Platelets 150 - 400 K/uL 509  466  429         Latest Ref Rng & Units 07/15/2023    3:37 AM 07/14/2023    3:22 AM 07/13/2023    4:01 AM  CMP  Glucose 70 - 99 mg/dL 161  096  045   BUN 6 - 20 mg/dL 9  9  9    Creatinine 0.44 - 1.00 mg/dL 4.09  8.11  9.14   Sodium 135 - 145 mmol/L 136  133  136   Potassium 3.5 - 5.1 mmol/L 3.7  3.9  3.9   Chloride 98 - 111 mmol/L 103  100  102   CO2 22 - 32 mmol/L 24  24  26    Calcium 8.9 - 10.3 mg/dL 8.4  8.3  8.2   Total Protein 6.5 - 8.1 g/dL  7.5    Total Bilirubin 0.3 - 1.2 mg/dL  0.5    Alkaline Phos 38 - 126 U/L  222    AST 15 - 41 U/L  74    ALT 0 - 44 U/L  69        RADIOGRAPHIC STUDIES: I have personally reviewed the radiological images as listed and agreed with the findings  in the report. No results found.    Orders Placed This Encounter  Procedures   Consent Attestation for Oncology Treatment    Order Specific Question:   The patient is informed of risks, benefits, side-effects of the prescribed oncology treatment. Potential short term and long term  side effects and response rates discussed. After a long discussion, the patient made informed decision to proceed.    Answer:   Yes   CBC with Differential (Cancer Center Only)    Standing Status:   Future    Standing Expiration Date:   07/30/2024   CMP (Cancer Center only)    Standing Status:   Future    Standing Expiration Date:   07/30/2024   CBC with Differential (Cancer Center Only)    Standing Status:   Future    Standing Expiration Date:   08/13/2024   CMP (Cancer Center only)    Standing Status:   Future    Standing Expiration Date:   08/13/2024   CBC with Differential (Cancer Center Only)    Standing Status:   Future    Standing Expiration Date:   08/27/2024   CMP (Cancer Center only)    Standing Status:   Future    Standing Expiration Date:   08/27/2024   CEA (IN HOUSE-[facility])    Standing Status:   Standing    Number of Occurrences:   20    Standing Expiration Date:   07/21/2024   Comprehensive metabolic panel    Standing Status:   Standing    Number of Occurrences:   50    Standing Expiration Date:   07/21/2024   CBC with Differential/Platelet    Standing Status:   Standing    Number of Occurrences:   50    Standing Expiration Date:   07/21/2024   Ambulatory Referral to [facility] Nutrition    Referral Priority:   Routine    Referral Type:   Consultation    Referral Reason:   Specialty Services Required    Number of Visits Requested:   1   ONCBCN PHYSICIAN COMMUNICATION 1    0 Number of doses of oxaliplatin received at [facility] or outside facility. signature of Provider. If patient has received greater than 5 doses of oxaliplatin, the following pre-medications should be ordered: dexamethasone,  diphenhydramine, and formulary histamine H2 antagonist. If patient cannot tolerate oral histamine H2 antagonist, IV may be given.   All questions were answered. The patient knows to call the clinic with any problems, questions or concerns. No barriers to learning was detected. The total time spent in the appointment was 45 minutes.     Malachy Mood, MD 07/22/2023

## 2023-07-22 NOTE — Progress Notes (Signed)
I met with Linda [name] and her daughter after  her appointment with  Dr Mosetta Putt.  I explained my role as a nurse navigator and provided my contact information. I told her that she will be scheduled for chemotherapy education class prior to receiving chemotherapy.  I told her our schedulers will call her with those appts and appointments for her chemotherapy infusion. All questions were answered. She verbalized understanding.

## 2023-07-22 NOTE — Assessment & Plan Note (Signed)
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  [name] in the liver MRI also showed a 1.8 cm oligo met in segment 7 of liver. -We discussed the incurable nature of her metastatic disease, due to her metastasis in peritoneum and liver.  We also discussed the small possibility of HIPEC and liver directed therapy if she has excellent response to chemotherapy. -I recommend first-line chemotherapy FOLFOX.  She is not a candidate for first-line immunotherapy given MMR normal.  I will order NexGen or sequencing Tempus to see if she is a candidate for targeted therapy.  Given the primary tumor in terminal ileum, there is no benefit of VEGFR inhibitor even if her tumor is wild type of KRAS/NRAS/BRAF. Benefit of bevacizumab is also uncertain given the primary tumor in small intestine.

## 2023-07-23 ENCOUNTER — Encounter: Payer: Self-pay | Admitting: Hematology

## 2023-07-23 ENCOUNTER — Inpatient Hospital Stay: Payer: BC Managed Care – PPO | Admitting: Licensed Clinical Social Worker

## 2023-07-23 ENCOUNTER — Other Ambulatory Visit: Payer: Self-pay

## 2023-07-23 ENCOUNTER — Telehealth: Payer: Self-pay | Admitting: Hematology

## 2023-07-23 DIAGNOSIS — C179 Malignant neoplasm of small intestine, unspecified: Secondary | ICD-10-CM

## 2023-07-23 NOTE — Progress Notes (Signed)
[facility] Clinical Social Work  Initial Assessment   Linda [name] is a 57 y.o. year old female contacted by phone. Clinical Social Work was referred by medical provider for assessment of psychosocial needs.   SDOH (Social Determinants of Health) assessments performed: Yes SDOH Interventions    Flowsheet Row Telephone from 07/17/2023 in [place] POPULATION HEALTH DEPARTMENT  SDOH Interventions   Housing Interventions Intervention Not Indicated  Transportation Interventions Intervention Not Indicated       SDOH Screenings   Food Insecurity: No Food Insecurity (06/20/2023)  Housing: Low Risk  (07/17/2023)  Transportation Needs: No Transportation Needs (07/17/2023)  Utilities: Not At Risk (06/20/2023)  Depression (PHQ2-9): Low Risk  (03/05/2023)  Tobacco Use: Low Risk  (06/20/2023)     Distress Screen completed: No     No data to display            Family/Social Information:  Housing Arrangement: patient lives alone but has been staying w/ her daughter and grandchildren since discharging from the hospital  Family members/support persons in your life? Pt also has 3 sons who reside locally as well as a number of family and friends who are available to assist as needed.   Transportation concerns: no  Employment: Working full time at [employer].  Pt has both short term and long term disability benefits.  Income source: Short-Term Disability Financial concerns: No Type of concern: None Food access concerns: no Religious or spiritual practice: Not known Services Currently in place:  none  Coping/ Adjustment to diagnosis: Patient understands treatment plan and what happens next? yes Concerns about diagnosis and/or treatment: Overwhelmed by information and Quality of life Patient reported stressors: Adjusting to my illness Hopes and/or priorities: Pt's priority is to start treatment w/ the hope of positive results Patient enjoys time with family/ friends Current coping skills/  strengths: Capable of independent living , Motivation for treatment/growth , and Supportive family/friends     SUMMARY: Current SDOH Barriers:  No barriers identified at this time.  Clinical Social Work Clinical Goal(s):  No clinical social work goals at this time  Interventions: Discussed common feeling and emotions when being diagnosed with cancer, and the importance of support during treatment Informed patient of the support team roles and support services at [facility] Provided CSW contact information and encouraged patient to call with any questions or concerns Provided patient with information about individual counseling should pt feel at some point this may be beneficial, as well as information regarding the [facility] Cancer retreat in November.    Follow Up Plan: Patient will contact CSW with any support or resource needs Patient verbalizes understanding of plan: Yes    Rachel Moulds, LCSW Clinical Social Worker [facility]

## 2023-07-24 ENCOUNTER — Other Ambulatory Visit: Payer: BC Managed Care – PPO

## 2023-07-24 ENCOUNTER — Other Ambulatory Visit: Payer: Self-pay

## 2023-07-24 ENCOUNTER — Telehealth: Payer: Self-pay

## 2023-07-24 NOTE — Patient Outreach (Signed)
Care Management  Transitions of Care Program Transitions of Care Post-discharge week 2   07/24/2023 Name: Linda [name] MRN: [id number] DOB: 1966-08-16  Subjective: Linda [name] is a 57 y.o. year old female who is a primary care patient of Arnette Felts, [name]. The Care Management team Engaged with patient Engaged with patient by telephone to assess and address transitions of care needs.   Consent to Services:  Patient was given information about care management services, agreed to services, and gave verbal consent to participate.   Assessment:  She is A & O x 4 Offers no complaints , living wit Daughter, ( SIL and grandchildren)has support at home  She is adjusting well to DX and accepting of Treatment plan- reviewed process, potential side effects and expectations of Treatments- she will review book on Chemo   Has Chemo Prep class 07/30/23, !st Chemo Treatment 07/31/23 Schedule is 2 days on then 3 weeks off # of treatments unknown   Good medication compliance         SDOH Interventions    Flowsheet Row Telephone from 07/24/2023 in [place] [facility] DEPARTMENT Telephone from 07/17/2023 in [place] [facility]ATION [facility] DEPARTMENT  SDOH Interventions    Food Insecurity Interventions Intervention Not Indicated --  Housing Interventions -- Intervention Not Indicated  Transportation Interventions -- Intervention Not Indicated  Utilities Interventions Intervention Not Indicated --  Physical Activity Interventions Other (Comments)  [limited activity, starting Chemo next week] --        Goals Addressed             This Visit's Progress    TOC Care Plan       Current Barriers:  Care Coordination needs related to multiple changes in medications and new abdominal wound- healing well no s/s infection    RNCM Clinical Goal(s):  Patient will  through collaboration with RN Care manager, provider, and care team.  demonstrate ongoing self health care management ability  with medication management ,symptom control and no infections r/t abdominal wound   through collaboration with RN Care manager, provider, and care team.Has follow-up lapts Chemp prep calss 07/30/23 st Chemo Treatment 07/31/23 with a 2 day on  2 weeks off cycle # of treatment unknown   Interventions: Evaluation of current treatment plan related to  self management and patient's adherence to plan as established by provider     (Status:  New goal.)  Short Term Goal Evaluation of current treatment plan related to  wound care   self-management and patient's adherence to plan as established by provider. Discussed plans with patient for ongoing care management follow up and provided patient with direct contact information for care management team Provided education to patient re: supplies need and wound care r/t dressing changes s/s of infections and when to call CM and or Provider Reviewed scheduled/upcoming provider appointments including multiple specialist appts ( Oncology, Surgical f/u) Discussed plans with patient for ongoing care management follow up and provided patient with direct contact information for care management team  Pain Interventions:  (Status:  New goal.) Short Term Goal Pain assessment performed Medications reviewed Reviewed provider established plan for pain management Discussed importance of adherence to all scheduled medical appointments Counseled on the importance of reporting any/all new or changed pain symptoms or management strategies to pain management provider Reviewed with patient prescribed pharmacological and nonpharmacological pain relief strategies  Patient Goals/Self-Care Activities: Take all medications as prescribed Attend all scheduled provider appointments Call pharmacy for medication refills 3-7 days  in advance of running out of medications  Follow Up Plan:  Telephone follow up appointment with care management team member scheduled for:  08/01/23 @ 10:00am            Plan: Telephone follow up appointment with care management team member scheduled for: The patient has been provided with contact information for the care management team and has been advised to call with any health related questions or concerns.   Linda [name] , BSN, RN Care Management Coordinator [place]   [facility] [email or web address] Direct Dial: [phone]

## 2023-07-24 NOTE — Progress Notes (Signed)
Pharmacist Chemotherapy Monitoring - Initial Assessment    Anticipated start date: 07/31/23   The following has been reviewed per standard work regarding the patient's treatment regimen: The patient's diagnosis, treatment plan and drug doses, and organ/hematologic function Lab orders and baseline tests specific to treatment regimen  The treatment plan start date, drug sequencing, and pre-medications Prior authorization status  Patient's documented medication list, including drug-drug interaction screen and prescriptions for anti-emetics and supportive care specific to the treatment regimen The drug concentrations, fluid compatibility, administration routes, and timing of the medications to be used The patient's access for treatment and lifetime cumulative dose history, if applicable  The patient's medication allergies and previous infusion related reactions, if applicable   Changes made to treatment plan:  N/A  Follow up needed:  N/A   Jerry Caras, PharmD PGY2 Oncology Pharmacy Resident   07/24/2023 2:10 PM

## 2023-07-30 ENCOUNTER — Inpatient Hospital Stay: Payer: BC Managed Care – PPO

## 2023-07-30 ENCOUNTER — Ambulatory Visit: Payer: BC Managed Care – PPO | Admitting: Nurse Practitioner

## 2023-07-30 VITALS — BP 120/82 | HR 80 | Temp 99.5°F | Ht 64.0 in | Wt 185.8 lb

## 2023-07-30 DIAGNOSIS — E1169 Type 2 diabetes mellitus with other specified complication: Secondary | ICD-10-CM | POA: Diagnosis not present

## 2023-07-30 DIAGNOSIS — C179 Malignant neoplasm of small intestine, unspecified: Secondary | ICD-10-CM | POA: Diagnosis not present

## 2023-07-30 DIAGNOSIS — Z2821 Immunization not carried out because of patient refusal: Secondary | ICD-10-CM

## 2023-07-30 DIAGNOSIS — E669 Obesity, unspecified: Secondary | ICD-10-CM | POA: Diagnosis not present

## 2023-07-30 DIAGNOSIS — Z09 Encounter for follow-up examination after completed treatment for conditions other than malignant neoplasm: Secondary | ICD-10-CM

## 2023-07-30 DIAGNOSIS — T148XXA Other injury of unspecified body region, initial encounter: Secondary | ICD-10-CM

## 2023-07-30 MED FILL — Dexamethasone Sodium Phosphate Inj 100 MG/10ML: INTRAMUSCULAR | Qty: 1 | Status: AC

## 2023-07-31 ENCOUNTER — Inpatient Hospital Stay: Payer: BC Managed Care – PPO

## 2023-07-31 ENCOUNTER — Inpatient Hospital Stay: Payer: BC Managed Care – PPO | Admitting: Nurse Practitioner

## 2023-07-31 ENCOUNTER — Other Ambulatory Visit: Payer: Self-pay

## 2023-07-31 ENCOUNTER — Inpatient Hospital Stay: Payer: BC Managed Care – PPO | Admitting: Dietician

## 2023-07-31 ENCOUNTER — Encounter: Payer: Self-pay | Admitting: Nurse Practitioner

## 2023-07-31 DIAGNOSIS — Z833 Family history of diabetes mellitus: Secondary | ICD-10-CM | POA: Diagnosis not present

## 2023-07-31 DIAGNOSIS — E860 Dehydration: Secondary | ICD-10-CM | POA: Diagnosis not present

## 2023-07-31 DIAGNOSIS — Z5986 Financial insecurity: Secondary | ICD-10-CM | POA: Diagnosis not present

## 2023-07-31 DIAGNOSIS — C172 Malignant neoplasm of ileum: Secondary | ICD-10-CM

## 2023-07-31 DIAGNOSIS — C787 Secondary malignant neoplasm of liver and intrahepatic bile duct: Secondary | ICD-10-CM | POA: Diagnosis not present

## 2023-07-31 DIAGNOSIS — R569 Unspecified convulsions: Secondary | ICD-10-CM | POA: Diagnosis not present

## 2023-07-31 DIAGNOSIS — D509 Iron deficiency anemia, unspecified: Secondary | ICD-10-CM | POA: Diagnosis not present

## 2023-07-31 DIAGNOSIS — Z79899 Other long term (current) drug therapy: Secondary | ICD-10-CM | POA: Diagnosis not present

## 2023-07-31 DIAGNOSIS — Z6831 Body mass index (BMI) 31.0-31.9, adult: Secondary | ICD-10-CM | POA: Diagnosis not present

## 2023-07-31 DIAGNOSIS — D500 Iron deficiency anemia secondary to blood loss (chronic): Secondary | ICD-10-CM

## 2023-07-31 DIAGNOSIS — Z9049 Acquired absence of other specified parts of digestive tract: Secondary | ICD-10-CM | POA: Diagnosis not present

## 2023-07-31 DIAGNOSIS — C786 Secondary malignant neoplasm of retroperitoneum and peritoneum: Secondary | ICD-10-CM | POA: Diagnosis not present

## 2023-07-31 DIAGNOSIS — C189 Malignant neoplasm of colon, unspecified: Secondary | ICD-10-CM | POA: Diagnosis not present

## 2023-07-31 DIAGNOSIS — R000 Tachycardia, unspecified: Secondary | ICD-10-CM | POA: Diagnosis not present

## 2023-07-31 DIAGNOSIS — Z95828 Presence of other vascular implants and grafts: Secondary | ICD-10-CM

## 2023-07-31 DIAGNOSIS — N179 Acute kidney failure, unspecified: Secondary | ICD-10-CM | POA: Diagnosis not present

## 2023-07-31 DIAGNOSIS — T451X5A Adverse effect of antineoplastic and immunosuppressive drugs, initial encounter: Secondary | ICD-10-CM | POA: Diagnosis not present

## 2023-07-31 DIAGNOSIS — R29818 Other symptoms and signs involving the nervous system: Secondary | ICD-10-CM | POA: Diagnosis not present

## 2023-07-31 DIAGNOSIS — E119 Type 2 diabetes mellitus without complications: Secondary | ICD-10-CM | POA: Diagnosis not present

## 2023-07-31 DIAGNOSIS — G4089 Other seizures: Secondary | ICD-10-CM | POA: Diagnosis not present

## 2023-07-31 DIAGNOSIS — Z8249 Family history of ischemic heart disease and other diseases of the circulatory system: Secondary | ICD-10-CM | POA: Diagnosis not present

## 2023-07-31 DIAGNOSIS — R29810 Facial weakness: Secondary | ICD-10-CM | POA: Diagnosis not present

## 2023-07-31 DIAGNOSIS — I10 Essential (primary) hypertension: Secondary | ICD-10-CM | POA: Diagnosis not present

## 2023-07-31 DIAGNOSIS — E66811 Obesity, class 1: Secondary | ICD-10-CM | POA: Diagnosis not present

## 2023-07-31 DIAGNOSIS — G939 Disorder of brain, unspecified: Secondary | ICD-10-CM | POA: Diagnosis not present

## 2023-07-31 LAB — CBC WITH DIFFERENTIAL (CANCER CENTER ONLY)
Abs Immature Granulocytes: 0.02 10*3/uL (ref 0.00–0.07)
Basophils Absolute: 0.1 10*3/uL (ref 0.0–0.1)
Basophils Relative: 1 %
Eosinophils Absolute: 0.2 10*3/uL (ref 0.0–0.5)
Eosinophils Relative: 3 %
HCT: 34.9 % — ABNORMAL LOW (ref 36.0–46.0)
Hemoglobin: 10.8 g/dL — ABNORMAL LOW (ref 12.0–15.0)
Immature Granulocytes: 0 %
Lymphocytes Relative: 19 %
Lymphs Abs: 1.3 10*3/uL (ref 0.7–4.0)
MCH: 26.2 pg (ref 26.0–34.0)
MCHC: 30.9 g/dL (ref 30.0–36.0)
MCV: 84.5 fL (ref 80.0–100.0)
Monocytes Absolute: 0.6 10*3/uL (ref 0.1–1.0)
Monocytes Relative: 8 %
Neutro Abs: 4.7 10*3/uL (ref 1.7–7.7)
Neutrophils Relative %: 69 %
Platelet Count: 298 10*3/uL (ref 150–400)
RBC: 4.13 MIL/uL (ref 3.87–5.11)
RDW: 24 % — ABNORMAL HIGH (ref 11.5–15.5)
WBC Count: 6.8 10*3/uL (ref 4.0–10.5)
nRBC: 0 % (ref 0.0–0.2)

## 2023-07-31 LAB — CMP (CANCER CENTER ONLY)
ALT: 12 U/L (ref 0–44)
AST: 13 U/L — ABNORMAL LOW (ref 15–41)
Albumin: 3.4 g/dL — ABNORMAL LOW (ref 3.5–5.0)
Alkaline Phosphatase: 107 U/L (ref 38–126)
Anion gap: 8 (ref 5–15)
BUN: <5 mg/dL — ABNORMAL LOW (ref 6–20)
CO2: 28 mmol/L (ref 22–32)
Calcium: 9.2 mg/dL (ref 8.9–10.3)
Chloride: 102 mmol/L (ref 98–111)
Creatinine: 0.66 mg/dL (ref 0.44–1.00)
GFR, Estimated: >60 mL/min (ref >60)
Glucose, Bld: 99 mg/dL (ref 70–99)
Potassium: 3.5 mmol/L (ref 3.5–5.1)
Sodium: 138 mmol/L (ref 135–145)
Total Bilirubin: 0.6 mg/dL (ref 0.3–1.2)
Total Protein: 8 g/dL (ref 6.5–8.1)

## 2023-07-31 LAB — CEA (IN HOUSE-CHCC): CEA (CHCC-In House): 77.73 ng/mL — ABNORMAL HIGH (ref 0.00–5.00)

## 2023-07-31 LAB — FERRITIN: Ferritin: 259 ng/mL (ref 11–307)

## 2023-07-31 MED ORDER — SODIUM CHLORIDE 0.9% FLUSH: 10.0000 mL | INTRAVENOUS | Status: DC | PRN

## 2023-07-31 MED ORDER — SODIUM CHLORIDE 0.9 % IV SOLN
400.0000 mg/m2 | Freq: Once | INTRAVENOUS | Status: AC
  Administered 2023-07-31: 772 mg via INTRAVENOUS
  Filled 2023-07-31: qty 38.6

## 2023-07-31 MED ORDER — PALONOSETRON HCL INJECTION 0.25 MG/5ML
0.2500 mg | Freq: Once | INTRAVENOUS | Status: AC
  Administered 2023-07-31: 0.25 mg via INTRAVENOUS
  Filled 2023-07-31: qty 5

## 2023-07-31 MED ORDER — SODIUM CHLORIDE 0.9% FLUSH
10.0000 mL | Freq: Once | INTRAVENOUS | Status: AC
  Administered 2023-07-31: 10 mL

## 2023-07-31 MED ORDER — DEXTROSE 5 % IV SOLN: Freq: Once | INTRAVENOUS | Status: AC

## 2023-07-31 MED ORDER — HEPARIN SOD (PORK) LOCK FLUSH 100 UNIT/ML IV SOLN: 500.0000 [IU] | Freq: Once | INTRAVENOUS | Status: DC | PRN

## 2023-07-31 MED ORDER — SODIUM CHLORIDE 0.9 % IV SOLN
2400.0000 mg/m2 | INTRAVENOUS | Status: DC
  Administered 2023-07-31: 5000 mg via INTRAVENOUS
  Filled 2023-07-31: qty 100

## 2023-07-31 NOTE — Progress Notes (Signed)
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 She is receiving palliative chemotherapy with FOLFOX q14d (first 10/10). Patient is under the care of Dr. Mosetta Putt.   8/30-9/25 hospital admission (Tolerating soft diet, TPN d/c at discharge)  Pasts medical history includes DM2, HTN, IDA  Oxaliplatin held for first chemo per MD.   Met with patient and daughter in infusion. She reports feeling "great" today. She continues to recover from surgery. Incision is healing. Reports new opening of wound with some clear drainage. Patient was examined by PA-C today. She has follow-up with surgeon tomorrow (10/11). Patient reports appetite has picked up and eating well since being home. Patient reports 3-4 small meals. She is also drinking Ensure. Daughter states they have a fridge full of different kinds to find which one pt likes best. Patient reports 2 soft bowel movements daily. She is taking miralax. She denies nausea, vomiting.   Nutrition Focused Physical Exam: deferred    Medications: robaxin, zofran, oxycodone, protonix, miralax, compazine, mylicon   Labs: BUN <5, albumin 3.4 (improved)   Anthropometrics:   Height: 5'4" Weight: 185 lb  UBW: 202 lb (per care everywhere 04/09/23) BMI: 31.76   NUTRITION DIAGNOSIS: Increased calorie and protein nutrient needs related to cancer s/p resection with open incision, 8% decrease from usual wt in 4 months which is significant for time frame   INTERVENTION:  Educated on small frequent meals with adequate calories and protein to support post - op healing - handout with snack ideas provided Discussed foods with protein, recommend protein source at every meal Continue drinking Ensure Plus/equivalent, recommend 2/day - samples + coupons Suggested trying Juven to support surgical wound healing - samples + coupons  provided Continue miralx as prescribed per MD Contact information given Pt finished with treatment and left prior to receiving samples. RD contacted pt via telephone. She will pick this up on Saturday 10/12 in infusion at pump d/c   MONITORING, EVALUATION, GOAL: Patient will tolerate increased calories and protein to promote wound healing, minimize further wt loss   Next Visit: Thursday November 7 in infusion with Britta Mccreedy

## 2023-07-31 NOTE — Patient Instructions (Signed)
[facility]  Discharge Instructions: Thank you for choosing [facility] to provide your oncology and hematology care.   If you have a lab appointment with the Cancer Center, please go directly to the Cancer Center and check in at the registration area.   Wear comfortable clothing and clothing appropriate for easy access to any Portacath or PICC line.   We strive to give you quality time with your provider. You may need to reschedule your appointment if you arrive late (15 or more minutes).  Arriving late affects you and other patients whose appointments are after yours.  Also, if you miss three or more appointments without notifying the office, you may be dismissed from the clinic at the provider's discretion.      For prescription refill requests, have your pharmacy contact our office and allow 72 hours for refills to be completed.    Today you received the following chemotherapy and/or immunotherapy agents: Leucovorin and Fluorouracil.   To help prevent nausea and vomiting after your treatment, we encourage you to take your nausea medication as directed.  BELOW ARE SYMPTOMS THAT SHOULD BE REPORTED IMMEDIATELY: *FEVER GREATER THAN 100.4 F (38 C) OR HIGHER *CHILLS OR SWEATING *NAUSEA AND VOMITING THAT IS NOT CONTROLLED WITH YOUR NAUSEA MEDICATION *UNUSUAL SHORTNESS OF BREATH *UNUSUAL BRUISING OR BLEEDING *URINARY PROBLEMS (pain or burning when urinating, or frequent urination) *BOWEL PROBLEMS (unusual diarrhea, constipation, pain near the anus) TENDERNESS IN MOUTH AND THROAT WITH OR WITHOUT PRESENCE OF ULCERS (sore throat, sores in mouth, or a toothache) UNUSUAL RASH, SWELLING OR PAIN  UNUSUAL VAGINAL DISCHARGE OR ITCHING   Items with * indicate a potential emergency and should be followed up as soon as possible or go to the Emergency Department if any problems should occur.  Please show the CHEMOTHERAPY ALERT CARD or IMMUNOTHERAPY  ALERT CARD at check-in to the Emergency Department and triage nurse.  Should you have questions after your visit or need to cancel or reschedule your appointment, please contact [facility]  Dept: [phone]  and follow the prompts.  Office hours are 8:00 a.m. to 4:30 p.m. Monday - Friday. Please note that voicemails left after 4:00 p.m. may not be returned until the following business day.  We are closed weekends and major holidays. You have access to a nurse at all times for urgent questions. Please call the main number to the clinic Dept: [phone] and follow the prompts.   For any non-urgent questions, you may also contact your provider using MyChart. We now offer e-Visits for anyone 59 and older to request care online for non-urgent symptoms. For details visit [email or web address].   Also download the MyChart app! Go to the app store, search "MyChart", open the app, select [facility], and log in with your MyChart username and password. The chemotherapy medication bag should finish at 46 hours, 96 hours, or 7 days. For example, if your pump is scheduled for 46 hours and it was put on at 4:00 p.m., it should finish at 2:00 p.m. the day it is scheduled to come off regardless of your appointment time.     Estimated time to finish at:    If the display on your pump reads "Low Volume" and it is beeping, take the batteries out of the pump and come to the cancer center for it to be taken off.   If the pump alarms go off prior to the pump reading "  Low Volume" then call [phone] and someone can assist you.  If the plunger comes out and the chemotherapy medication is leaking out, please use your home chemo spill kit to clean up the spill. Do NOT use paper towels or other household products.  If you have problems or questions regarding your pump, please call either [phone] (24 hours a day) or the cancer center Monday-Friday 8:00 a.m.- 4:30 p.m. at  the clinic number and we will assist you. If you are unable to get assistance, then go to the nearest Emergency Department and ask the staff to contact the IV team for assistance.  Leucovorin Injection What is this medication? LEUCOVORIN (loo koe VOR in) prevents side effects from certain medications, such as methotrexate. It works by increasing folate levels. This helps protect healthy cells in your body. It may also be used to treat anemia caused by low levels of folate. It can also be used with fluorouracil, a type of chemotherapy, to treat colorectal cancer. It works by increasing the effects of fluorouracil in the body. This medicine may be used for other purposes; ask your health care provider or pharmacist if you have questions. What should I tell my care team before I take this medication? They need to know if you have any of these conditions: Anemia from low levels of vitamin B12 in the blood An unusual or allergic reaction to leucovorin, folic acid, other medications, foods, dyes, or preservatives Pregnant or trying to get pregnant Breastfeeding How should I use this medication? This medication is injected into a vein or a muscle. It is given by your care team in a hospital or clinic setting. Talk to your care team about the use of this medication in children. Special care may be needed. Overdosage: If you think you have taken too much of this medicine contact a poison control center or emergency room at once. NOTE: This medicine is only for you. Do not share this medicine with others. What if I miss a dose? Keep appointments for follow-up doses. It is important not to miss your dose. Call your care team if you are unable to keep an appointment. What may interact with this medication? Capecitabine Fluorouracil Phenobarbital Phenytoin Primidone Trimethoprim;sulfamethoxazole This list may not describe all possible interactions. Give your health care provider a list of all the  medicines, herbs, non-prescription drugs, or dietary supplements you use. Also tell them if you smoke, drink alcohol, or use illegal drugs. Some items may interact with your medicine. What should I watch for while using this medication? Your condition will be monitored carefully while you are receiving this medication. This medication may increase the side effects of 5-fluorouracil. Tell your care team if you have diarrhea or mouth sores that do not get better or that get worse. What side effects may I notice from receiving this medication? Side effects that you should report to your care team as soon as possible: Allergic reactions--skin rash, itching, hives, swelling of the face, lips, tongue, or throat This list may not describe all possible side effects. Call your doctor for medical advice about side effects. You may report side effects to FDA at 1-800-FDA-1088. Where should I keep my medication? This medication is given in a hospital or clinic. It will not be stored at home. NOTE: This sheet is a summary. It may not cover all possible information. If you have questions about this medicine, talk to your doctor, pharmacist, or health care provider.  2024 Elsevier/Gold Standard (2022-03-12 00:00:00) Fluorouracil  Injection What is this medication? FLUOROURACIL (flure oh YOOR a sil) treats some types of cancer. It works by slowing down the growth of cancer cells. This medicine may be used for other purposes; ask your health care provider or pharmacist if you have questions. COMMON BRAND NAME(S): Adrucil What should I tell my care team before I take this medication? They need to know if you have any of these conditions: Blood disorders Dihydropyrimidine dehydrogenase (DPD) deficiency Infection, such as chickenpox, cold sores, herpes Kidney disease Liver disease Poor nutrition Recent or ongoing radiation therapy An unusual or allergic reaction to fluorouracil, other medications, foods, dyes,  or preservatives If you or your partner are pregnant or trying to get pregnant Breast-feeding How should I use this medication? This medication is injected into a vein. It is administered by your care team in a hospital or clinic setting. Talk to your care team about the use of this medication in children. Special care may be needed. Overdosage: If you think you have taken too much of this medicine contact a poison control center or emergency room at once. NOTE: This medicine is only for you. Do not share this medicine with others. What if I miss a dose? Keep appointments for follow-up doses. It is important not to miss your dose. Call your care team if you are unable to keep an appointment. What may interact with this medication? Do not take this medication with any of the following: Live virus vaccines This medication may also interact with the following: Medications that treat or prevent blood clots, such as warfarin, enoxaparin, dalteparin This list may not describe all possible interactions. Give your health care provider a list of all the medicines, herbs, non-prescription drugs, or dietary supplements you use. Also tell them if you smoke, drink alcohol, or use illegal drugs. Some items may interact with your medicine. What should I watch for while using this medication? Your condition will be monitored carefully while you are receiving this medication. This medication may make you feel generally unwell. This is not uncommon as chemotherapy can affect healthy cells as well as cancer cells. Report any side effects. Continue your course of treatment even though you feel ill unless your care team tells you to stop. In some cases, you may be given additional medications to help with side effects. Follow all directions for their use. This medication may increase your risk of getting an infection. Call your care team for advice if you get a fever, chills, sore throat, or other symptoms of a cold  or flu. Do not treat yourself. Try to avoid being around people who are sick. This medication may increase your risk to bruise or bleed. Call your care team if you notice any unusual bleeding. Be careful brushing or flossing your teeth or using a toothpick because you may get an infection or bleed more easily. If you have any dental work done, tell your dentist you are receiving this medication. Avoid taking medications that contain aspirin, acetaminophen, ibuprofen, naproxen, or ketoprofen unless instructed by your care team. These medications may hide a fever. Do not treat diarrhea with over the counter products. Contact your care team if you have diarrhea that lasts more than 2 days or if it is severe and watery. This medication can make you more sensitive to the sun. Keep out of the sun. If you cannot avoid being in the sun, wear protective clothing and sunscreen. Do not use sun lamps, tanning beds, or tanning booths. Talk to  your care team if you or your partner wish to become pregnant or think you might be pregnant. This medication can cause serious birth defects if taken during pregnancy and for 3 months after the last dose. A reliable form of contraception is recommended while taking this medication and for 3 months after the last dose. Talk to your care team about effective forms of contraception. Do not father a child while taking this medication and for 3 months after the last dose. Use a condom while having sex during this time period. Do not breastfeed while taking this medication. This medication may cause infertility. Talk to your care team if you are concerned about your fertility. What side effects may I notice from receiving this medication? Side effects that you should report to your care team as soon as possible: Allergic reactions--skin rash, itching, hives, swelling of the face, lips, tongue, or throat Heart attack--pain or tightness in the chest, shoulders, arms, or jaw, nausea,  shortness of breath, cold or clammy skin, feeling faint or lightheaded Heart failure--shortness of breath, swelling of the ankles, feet, or hands, sudden weight gain, unusual weakness or fatigue Heart rhythm changes--fast or irregular heartbeat, dizziness, feeling faint or lightheaded, chest pain, trouble breathing High ammonia level--unusual weakness or fatigue, confusion, loss of appetite, nausea, vomiting, seizures Infection--fever, chills, cough, sore throat, wounds that don't heal, pain or trouble when passing urine, general feeling of discomfort or being unwell Low red blood cell level--unusual weakness or fatigue, dizziness, headache, trouble breathing Pain, tingling, or numbness in the hands or feet, muscle weakness, change in vision, confusion or trouble speaking, loss of balance or coordination, trouble walking, seizures Redness, swelling, and blistering of the skin over hands and feet Severe or prolonged diarrhea Unusual bruising or bleeding Side effects that usually do not require medical attention (report to your care team if they continue or are bothersome): Dry skin Headache Increased tears Nausea Pain, redness, or swelling with sores inside the mouth or throat Sensitivity to light Vomiting This list may not describe all possible side effects. Call your doctor for medical advice about side effects. You may report side effects to FDA at 1-800-FDA-1088. Where should I keep my medication? This medication is given in a hospital or clinic. It will not be stored at home. NOTE: This sheet is a summary. It may not cover all possible information. If you have questions about this medicine, talk to your doctor, pharmacist, or health care provider.  2024 Elsevier/Gold Standard (2022-02-12 00:00:00)

## 2023-07-31 NOTE — Progress Notes (Addendum)
Patient Care Team: Arnette Felts, [name] as PCP - General (General Practice) Malachy Mood, MD as Consulting Physician (Medical Oncology) Berna Bue, MD as Consulting Physician (General Surgery) Griselda Miner, MD as Consulting Physician (General Surgery) Johnnette Barrios, RN as Registered Nurse   CHIEF COMPLAINT: Follow-up metastatic small bowel cancer  Oncology History  Cancer of ileum with carcinomatosis  07/10/2023 Initial Diagnosis   Cancer of ileum with carcinomatosis   07/22/2023 Cancer Staging   Staging form: Small Intestine - Adenocarcinoma, AJCC 8th Edition - Pathologic: Stage IV (pT4, pNX, pM1) - Signed by Malachy Mood, MD on 07/22/2023 Histologic grade (G): G2 Histologic grading system: 4 grade system   07/31/2023 -  Chemotherapy   Patient is on Treatment Plan : COLORECTAL FOLFOX q14d        CURRENT THERAPY: First-line FOLFOX q. 14 days, starting 07/31/2023  INTERVAL HISTORY Linda [name] returns for follow-up and to start treatment as scheduled, last seen by Dr. Mosetta Putt 07/22/2023.  No class.  She is doing well overall, appetite and pain have improved.  Takes oxycodone as needed but not daily.  Bowels moving with MiraLAX.  Eating and drinking well.  Denies nausea/vomiting.  The incision at the base is healing, but another area of the wound opened up on Saturday, draining slightly.  No fever or chills.  She will see her surgeon tomorrow for first outpatient visit.  ROS  All other systems reviewed and negative  Past Medical History:  Diagnosis Date   Anemia    Hypertension    Pre-diabetes 02/2022     Past Surgical History:  Procedure Laterality Date   BREAST LUMPECTOMY WITH RADIOACTIVE SEED LOCALIZATION Left 06/10/2022   Procedure: LEFT BREAST LUMPECTOMY WITH RADIOACTIVE SEED LOCALIZATION;  Surgeon: Griselda Miner, MD;  Location: [facility];  Service: General;  Laterality: Left;   CESAREAN SECTION     x4   IR IMAGING GUIDED PORT INSERTION  07/08/2023    LAPAROTOMY N/A 06/30/2023   Procedure: EXPLORATORY LAPAROTOMY, SMALL BOWEL RESECTION, AND EXCISIONAL BIOPSIES OF MULTIPLE OMENTAL MASSES;  Surgeon: Berna Bue, MD;  Location: [facility];  Service: General;  Laterality: N/A;   TUBAL LIGATION       Outpatient Encounter Medications as of 07/31/2023  Medication Sig Note   methocarbamol (ROBAXIN) 500 MG tablet Take 2 tablets (1,000 mg total) by mouth every 8 (eight) hours as needed for muscle spasms.    ondansetron (ZOFRAN) 8 MG tablet Take 1 tablet (8 mg total) by mouth every 8 (eight) hours as needed for nausea or vomiting. Start on the third day after chemotherapy.    oxyCODONE (OXY IR/ROXICODONE) 5 MG immediate release tablet Take 1-2 tablets (5-10 mg total) by mouth every 6 (six) hours as needed for moderate pain or severe pain (5mg  moderate, 10mg  severe).    polyethylene glycol powder (GLYCOLAX/MIRALAX) 17 GM/SCOOP powder Take 17 g by mouth daily mixed in liquid as directed.    prochlorperazine (COMPAZINE) 10 MG tablet Take 1 tablet (10 mg total) by mouth every 6 (six) hours as needed for nausea or vomiting.    acetaminophen (TYLENOL) 500 MG tablet Take 2 tablets (1,000 mg total) by mouth every 8 (eight) hours as needed for mild pain or fever. (Patient not taking: Reported on 07/24/2023)    lidocaine-prilocaine (EMLA) cream Apply to affected area once 07/24/2023: Uses prior to chemo   pantoprazole (PROTONIX) 40 MG tablet Take 1 tablet (40 mg total) by mouth 2 (two) times daily. (  Patient not taking: Reported on 07/31/2023)    simethicone (MYLICON) 80 MG chewable tablet Chew 1 tablet (80 mg total) by mouth 4 (four) times daily as needed for flatulence. (Patient not taking: Reported on 07/31/2023)    No facility-administered encounter medications on file as of 07/31/2023.     Today's Vitals   07/31/23 0900 07/31/23 0918  BP:  (!) 161/87  Pulse:  79  Resp:  16  Temp:  98.3 F (36.8 C)  TempSrc:  Oral  SpO2:  100%  Weight:  185 lb (83.9 kg)   PainSc: 0-No pain    Body mass index is 31.76 kg/m.   PHYSICAL EXAM GENERAL:alert, no distress and comfortable SKIN: no rash  EYES: sclera clear LUNGS:  normal breathing effort HEART: no lower extremity edema ABDOMEN: abdomen soft, non-tender and normal bowel sounds. Midline incision healing, small 1 cm open area with serous drainage above the umbilicus. Longer opening at the base is quite superficial. No erythema.  NEURO: alert & oriented x 3 with fluent speech, no focal motor/sensory deficits PAC without erythema    CBC    Component Value Date/Time   WBC 6.8 07/31/2023 0903   WBC 11.8 (H) 07/15/2023 0337   RBC 4.13 07/31/2023 0903   HGB 10.8 (L) 07/31/2023 0903   HGB 10.3 (L) 05/16/2022 1044   HCT 34.9 (L) 07/31/2023 0903   HCT 33.9 (L) 05/16/2022 1044   PLT 298 07/31/2023 0903   PLT 364 05/16/2022 1044   MCV 84.5 07/31/2023 0903   MCV 80 05/16/2022 1044   MCH 26.2 07/31/2023 0903   MCHC 30.9 07/31/2023 0903   RDW 24.0 (H) 07/31/2023 0903   RDW 19.4 (H) 05/16/2022 1044   LYMPHSABS 1.3 07/31/2023 0903   MONOABS 0.6 07/31/2023 0903   EOSABS 0.2 07/31/2023 0903   BASOSABS 0.1 07/31/2023 0903     CMP     Component Value Date/Time   NA 138 07/31/2023 0903   NA 141 05/16/2022 1044   K 3.5 07/31/2023 0903   CL 102 07/31/2023 0903   CO2 28 07/31/2023 0903   GLUCOSE 99 07/31/2023 0903   BUN <5 (L) 07/31/2023 0903   BUN 9 05/16/2022 1044   CREATININE 0.66 07/31/2023 0903   CALCIUM 9.2 07/31/2023 0903   PROT 8.0 07/31/2023 0903   PROT 7.8 05/16/2022 1044   ALBUMIN 3.4 (L) 07/31/2023 0903   ALBUMIN 4.4 05/16/2022 1044   AST 13 (L) 07/31/2023 0903   ALT 12 07/31/2023 0903   ALKPHOS 107 07/31/2023 0903   BILITOT 0.6 07/31/2023 0903   GFRNONAA >60 07/31/2023 1610     ASSESSMENT & PLAN: 57 year old female  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 Appetite and pain have improved. She has good PS and ready to start treatment -Due to delayed wound healing, and a new small opening, will omit Oxaliplatin today. She will f/up with surgery as scheduled 10/11. -We reviewed chemo symptom management, s/sx of infection, indications to call -Labs reviewed, proceed with C1 5FU/leuc only today -F/up in 2 weeks with C2 FOLFOX if she has healed well -Pt seen with  Dr. Mosetta Putt to assess wound    PLAN: -Labs reviewed -Proceed with C1 5FU/leuc only; due to delayed wound healing, and a new small incisional opening, will omit Oxaliplatin today -F/up with surgery as scheduled 10/11 -We reviewed chemo symptom management, s/sx of infection, indications to call -F/up in 2 weeks with C2 FOLFOX if she has healed well -Pt seen with Dr. Mosetta Putt    All questions were answered. The patient knows to call the clinic with any problems, questions or concerns. No barriers to learning were detected.   Santiago Glad, NP-C 07/31/2023  Addendum I have seen the patient, examined her. I agree with the assessment and and plan and have edited the notes.   Linda [name] is clinically doing well but her abdominal wound has not completely healted, especially the small opening in the upper part of incision with mild discharge. She has f/u with surgery later this week. Will proceed cycle 1 chemo with 5-fu pump and LV only and hole oxaliplatin due to her wound issues. She agrees. All questions were answered.   Malachy Mood MD 07/31/2023

## 2023-08-01 ENCOUNTER — Other Ambulatory Visit: Payer: BC Managed Care – PPO

## 2023-08-01 ENCOUNTER — Other Ambulatory Visit: Payer: Self-pay

## 2023-08-01 ENCOUNTER — Telehealth: Payer: Self-pay

## 2023-08-01 ENCOUNTER — Encounter: Payer: Self-pay | Admitting: Hematology

## 2023-08-01 NOTE — Telephone Encounter (Signed)
-----   Message from Nurse Dillard Essex sent at 07/31/2023 12:13 PM EDT ----- Regarding: Dr. Mosetta Putt Pt. first time Leucovorin and 5FU pump. They did not treat with Oxaliplatin this time due to Abdominal open wound. Tolerated well no issues or concerns.

## 2023-08-01 NOTE — Telephone Encounter (Signed)
Linda [name] states that she is having difficulty with N/V. She has used the compazine with no relief. She has vomited 4 times thus far. She did call the office and reached Dr. Latanya Maudlin nurse and was told to try the Ondansetron.  She is not at home yet as she had an appointment with another physician. Suggested that she wait 45-60 minutes after taking the Ondansetron to give it time to work and then try drinking the Gatorade. Suggested that she could try some saltine crackers as well. Recommended that she call back to Dr. Latanya Maudlin nurse by 1420 if her symptoms have not improved. Pt verbalized understanding.

## 2023-08-01 NOTE — Patient Outreach (Signed)
Care Management  Transitions of Care Program Transitions of Care Post-discharge week 3   08/01/2023 Name: Linda [name] MRN: [id number] DOB: 23-Apr-1966  Subjective: Linda [name] is a 57 y.o. year old female who is a primary care patient of Arnette Felts, [name]. The Care Management team Engaged with patient Engaged with patient by telephone to assess and address transitions of care needs.   Consent to Services:  Patient was given information about care management services, agreed to services, and gave verbal consent to participate.   Assessment:           SDOH Interventions    Flowsheet Row Telephone from 07/24/2023 in [place] [facility] Telephone from 07/17/2023 in [place] [facility]  SDOH Interventions    Food Insecurity Interventions Intervention Not Indicated --  Housing Interventions -- Intervention Not Indicated  Transportation Interventions -- Intervention Not Indicated  Utilities Interventions Intervention Not Indicated --  Physical Activity Interventions Other (Comments)  [limited activity, starting Chemo next week] --        Goals Addressed             This Visit's Progress    TOC Care Plan       Current Barriers:  Care Coordination needs related to multiple changes in medications and new abdominal wound- healing well no s/s infection    RNCM Clinical Goal(s):  Patient will  through collaboration with RN Care manager, provider, and care team.  demonstrate ongoing self health care management ability with medication management ,symptom control and no infections r/t abdominal wound   through collaboration with RN Care manager, provider, and care team.Has follow-up lapts Chemp prep calss 07/30/23 st Chemo Treatment 07/31/23 with a 2 day on  2 weeks off cycle # of treatment unknown   Interventions: Evaluation of current treatment plan related to  self management and patient's adherence to plan as established by  provider     (Status:  New goal.)  Short Term Goal Evaluation of current treatment plan related to  wound care   self-management and patient's adherence to plan as established by provider. Discussed plans with patient for ongoing care management follow up and provided patient with direct contact information for care management team Provided education to patient re: supplies need and wound care r/t dressing changes s/s of infections and when to call CM and or Provider Reviewed scheduled/upcoming provider appointments including multiple specialist appts ( Oncology, Surgical f/u) Discussed plans with patient for ongoing care management follow up and provided patient with direct contact information for care management team Advised patient to discuss increased nausea and poor sleep with provider  Pain Interventions:  (Status:  New goal.) Short Term Goal Pain assessment performed Medications reviewed Reviewed provider established plan for pain management Discussed importance of adherence to all scheduled medical appointments Counseled on the importance of reporting any/all new or changed pain symptoms or management strategies to pain management provider Reviewed with patient prescribed pharmacological and nonpharmacological pain relief strategies  Patient Goals/Self-Care Activities: Participate in Transition of Care Program/Attend TOC scheduled calls Take all medications as prescribed Attend all scheduled provider appointments Call pharmacy for medication refills 3-7 days in advance of running out of medications Call provider office for new concerns or questions   Follow Up Plan:  Telephone follow up appointment with care management team member scheduled for:  08/06/23 @ 11:30am  The patient has been provided with contact information for the care management team and has been advised to call  with any health related questions or concerns.  The patient will call Provider regarding Nausea not  resolved with interventions and need for sleep aid  as advised to 08/01/23.          Plan: Telephone follow up appointment with care management team member scheduled for: The patient has been provided with contact information for the care management team and has been advised to call with any health related questions or concerns.  Follow up with provider re: unrelieved nausea and + vomiting and need for sleep aid    Goals Addressed             This Visit's Progress    TOC Care Plan       Current Barriers:  Care Coordination needs related to multiple changes in medications and new abdominal wound- healing well no s/s infection    RNCM Clinical Goal(s):  Patient will  through collaboration with RN Care manager, provider, and care team.  demonstrate ongoing self health care management ability with medication management ,symptom control and no infections r/t abdominal wound   through collaboration with RN Care manager, provider, and care team.Has follow-up lapts Chemp prep calss 07/30/23 st Chemo Treatment 07/31/23 with a 2 day on  2 weeks off cycle # of treatment unknown   Interventions: Evaluation of current treatment plan related to  self management and patient's adherence to plan as established by provider     (Status:  New goal.)  Short Term Goal Evaluation of current treatment plan related to  wound care   self-management and patient's adherence to plan as established by provider. Discussed plans with patient for ongoing care management follow up and provided patient with direct contact information for care management team Provided education to patient re: supplies need and wound care r/t dressing changes s/s of infections and when to call CM and or Provider Reviewed scheduled/upcoming provider appointments including multiple specialist appts ( Oncology, Surgical f/u) Discussed plans with patient for ongoing care management follow up and provided patient with direct contact  information for care management team Advised patient to discuss increased nausea and poor sleep with provider  Pain Interventions:  (Status:  New goal.) Short Term Goal Pain assessment performed Medications reviewed Reviewed provider established plan for pain management Discussed importance of adherence to all scheduled medical appointments Counseled on the importance of reporting any/all new or changed pain symptoms or management strategies to pain management provider Reviewed with patient prescribed pharmacological and nonpharmacological pain relief strategies  Patient Goals/Self-Care Activities: Participate in Transition of Care Program/Attend TOC scheduled calls Take all medications as prescribed Attend all scheduled provider appointments Call pharmacy for medication refills 3-7 days in advance of running out of medications Call provider office for new concerns or questions   Follow Up Plan:  Telephone follow up appointment with care management team member scheduled for:  08/06/23 @ 11:30am  The patient has been provided with contact information for the care management team and has been advised to call with any health related questions or concerns.  The patient will call Provider regarding Nausea not resolved with interventions and need for sleep aid  as advised to 08/01/23.            Susa Loffler , BSN, RN Care Management Coordinator    [facility] [email or web address] Direct Dial: [phone]

## 2023-08-02 ENCOUNTER — Encounter (HOSPITAL_COMMUNITY): Payer: Self-pay | Admitting: *Deleted

## 2023-08-02 ENCOUNTER — Inpatient Hospital Stay (HOSPITAL_COMMUNITY)
Admission: EM | Admit: 2023-08-02 | Discharge: 2023-08-04 | DRG: 101 | Disposition: A | Discharge status: Home / Self Care | Payer: BC Managed Care – PPO | Attending: Internal Medicine | Admitting: Internal Medicine

## 2023-08-02 ENCOUNTER — Other Ambulatory Visit: Payer: Self-pay

## 2023-08-02 ENCOUNTER — Emergency Department (HOSPITAL_COMMUNITY): Payer: BC Managed Care – PPO

## 2023-08-02 ENCOUNTER — Inpatient Hospital Stay: Payer: BC Managed Care – PPO

## 2023-08-02 VITALS — BP 129/74 | HR 94 | Temp 98.8°F | Resp 17

## 2023-08-02 DIAGNOSIS — E860 Dehydration: Secondary | ICD-10-CM | POA: Diagnosis present

## 2023-08-02 DIAGNOSIS — I10 Essential (primary) hypertension: Secondary | ICD-10-CM | POA: Diagnosis present

## 2023-08-02 DIAGNOSIS — Z6831 Body mass index (BMI) 31.0-31.9, adult: Secondary | ICD-10-CM

## 2023-08-02 DIAGNOSIS — Z9049 Acquired absence of other specified parts of digestive tract: Secondary | ICD-10-CM

## 2023-08-02 DIAGNOSIS — E119 Type 2 diabetes mellitus without complications: Secondary | ICD-10-CM | POA: Diagnosis present

## 2023-08-02 DIAGNOSIS — Z5986 Financial insecurity: Secondary | ICD-10-CM

## 2023-08-02 DIAGNOSIS — N179 Acute kidney failure, unspecified: Secondary | ICD-10-CM | POA: Diagnosis present

## 2023-08-02 DIAGNOSIS — C172 Malignant neoplasm of ileum: Secondary | ICD-10-CM

## 2023-08-02 DIAGNOSIS — G939 Disorder of brain, unspecified: Secondary | ICD-10-CM | POA: Diagnosis present

## 2023-08-02 DIAGNOSIS — R569 Unspecified convulsions: Principal | ICD-10-CM

## 2023-08-02 DIAGNOSIS — C786 Secondary malignant neoplasm of retroperitoneum and peritoneum: Secondary | ICD-10-CM | POA: Diagnosis present

## 2023-08-02 DIAGNOSIS — T451X5A Adverse effect of antineoplastic and immunosuppressive drugs, initial encounter: Secondary | ICD-10-CM | POA: Diagnosis present

## 2023-08-02 DIAGNOSIS — Z8249 Family history of ischemic heart disease and other diseases of the circulatory system: Secondary | ICD-10-CM

## 2023-08-02 DIAGNOSIS — D509 Iron deficiency anemia, unspecified: Secondary | ICD-10-CM | POA: Diagnosis present

## 2023-08-02 DIAGNOSIS — Z833 Family history of diabetes mellitus: Secondary | ICD-10-CM

## 2023-08-02 DIAGNOSIS — C787 Secondary malignant neoplasm of liver and intrahepatic bile duct: Secondary | ICD-10-CM | POA: Diagnosis present

## 2023-08-02 DIAGNOSIS — Z79899 Other long term (current) drug therapy: Secondary | ICD-10-CM

## 2023-08-02 DIAGNOSIS — E66811 Obesity, class 1: Secondary | ICD-10-CM | POA: Diagnosis present

## 2023-08-02 DIAGNOSIS — G4089 Other seizures: Principal | ICD-10-CM | POA: Diagnosis present

## 2023-08-02 LAB — BASIC METABOLIC PANEL
Anion gap: 16 — ABNORMAL HIGH (ref 5–15)
BUN: <5 mg/dL — ABNORMAL LOW (ref 6–20)
CO2: 24 mmol/L (ref 22–32)
Calcium: 9.8 mg/dL (ref 8.9–10.3)
Chloride: 98 mmol/L (ref 98–111)
Creatinine, Ser: 1.11 mg/dL — ABNORMAL HIGH (ref 0.44–1.00)
GFR, Estimated: 59 mL/min — ABNORMAL LOW (ref >60)
Glucose, Bld: 131 mg/dL — ABNORMAL HIGH (ref 70–99)
Potassium: 3.4 mmol/L — ABNORMAL LOW (ref 3.5–5.1)
Sodium: 138 mmol/L (ref 135–145)

## 2023-08-02 LAB — RAPID URINE DRUG SCREEN, HOSP PERFORMED
Amphetamines: NOT DETECTED
Barbiturates: NOT DETECTED
Benzodiazepines: NOT DETECTED
Cocaine: NOT DETECTED
Opiates: NOT DETECTED
Tetrahydrocannabinol: NOT DETECTED

## 2023-08-02 LAB — PROTIME-INR
INR: 1.1 (ref 0.8–1.2)
Prothrombin Time: 14.7 s (ref 11.4–15.2)

## 2023-08-02 LAB — URINALYSIS, ROUTINE W REFLEX MICROSCOPIC
Bilirubin Urine: NEGATIVE
Glucose, UA: NEGATIVE mg/dL
Ketones, ur: 5 mg/dL — AB
Nitrite: NEGATIVE
Protein, ur: 30 mg/dL — AB
Specific Gravity, Urine: 1.014 (ref 1.005–1.030)
pH: 6 (ref 5.0–8.0)

## 2023-08-02 LAB — CBC
HCT: 39 % (ref 36.0–46.0)
Hemoglobin: 12.1 g/dL (ref 12.0–15.0)
MCH: 26.2 pg (ref 26.0–34.0)
MCHC: 31 g/dL (ref 30.0–36.0)
MCV: 84.6 fL (ref 80.0–100.0)
Platelets: 268 10*3/uL (ref 150–400)
RBC: 4.61 MIL/uL (ref 3.87–5.11)
RDW: 23.9 % — ABNORMAL HIGH (ref 11.5–15.5)
WBC: 5.7 10*3/uL (ref 4.0–10.5)
nRBC: 0 % (ref 0.0–0.2)

## 2023-08-02 LAB — APTT: aPTT: 30 s (ref 24–36)

## 2023-08-02 LAB — CBG MONITORING, ED: Glucose-Capillary: 130 mg/dL — ABNORMAL HIGH (ref 70–99)

## 2023-08-02 LAB — HCG, SERUM, QUALITATIVE: Preg, Serum: NEGATIVE

## 2023-08-02 MED ORDER — LEVETIRACETAM IN NACL 1500 MG/100ML IV SOLN
1500.0000 mg | Freq: Once | INTRAVENOUS | Status: AC
  Administered 2023-08-02: 1500 mg via INTRAVENOUS
  Filled 2023-08-02: qty 100

## 2023-08-02 MED ORDER — GADOBUTROL 1 MMOL/ML IV SOLN
8.0000 mL | Freq: Once | INTRAVENOUS | Status: AC | PRN
  Administered 2023-08-02: 8 mL via INTRAVENOUS

## 2023-08-02 MED ORDER — LEVETIRACETAM IN NACL 500 MG/100ML IV SOLN
500.0000 mg | Freq: Once | INTRAVENOUS | Status: AC
  Administered 2023-08-02: 500 mg via INTRAVENOUS

## 2023-08-02 MED ORDER — LORAZEPAM 2 MG/ML IJ SOLN
INTRAMUSCULAR | Status: AC
  Administered 2023-08-02: 2 mg via INTRAVENOUS
  Filled 2023-08-02: qty 1

## 2023-08-02 MED ORDER — LORAZEPAM 2 MG/ML IJ SOLN: 2.0000 mg | INTRAMUSCULAR | Status: DC | PRN

## 2023-08-02 MED ORDER — LEVETIRACETAM IN NACL 1000 MG/100ML IV SOLN
1000.0000 mg | Freq: Two times a day (BID) | INTRAVENOUS | Status: DC
  Administered 2023-08-02 – 2023-08-03 (×3): 1000 mg via INTRAVENOUS
  Filled 2023-08-02 (×3): qty 100

## 2023-08-02 MED ORDER — LEVETIRACETAM 500 MG PO TABS: 500.0000 mg | ORAL_TABLET | Freq: Two times a day (BID) | ORAL | 0 refills | Status: DC

## 2023-08-02 MED ORDER — SODIUM CHLORIDE 0.9% FLUSH
10.0000 mL | INTRAVENOUS | Status: DC | PRN
  Administered 2023-08-02: 10 mL

## 2023-08-02 MED ORDER — HEPARIN SOD (PORK) LOCK FLUSH 100 UNIT/ML IV SOLN
500.0000 [IU] | Freq: Once | INTRAVENOUS | Status: AC | PRN
  Administered 2023-08-02: 500 [IU]

## 2023-08-02 MED ORDER — SODIUM CHLORIDE 0.9 % IV SOLN: 3500.0000 mg | Freq: Once | INTRAVENOUS | Status: DC

## 2023-08-02 NOTE — ED Provider Notes (Signed)
Care of patient wound at 10:28 PM, please see prior note for complete H/P and care plan.   Clinical Course as of 08/02/23 2228  Sat Aug 02, 2023  2222 I was approached by nursing about this patient. She was discharged per prior provider however she has had another seizure-like event that has occurred.  She had returned to baseline for approximately 3 hours per documentation and just prior to discharge has had a breakthrough episode.  Video put into media tab and neurology was consulted again given these findings. [CC]    Clinical Course User Index [CC] Glyn Ade, MD    Reassessment: Neurology consulted and at bedside at this time. They have recommended more aggressive management of the seizures.  Patient is early in treatment at time of handoff to overnight provider.  Warm handoff completed.   Glyn Ade, MD 08/02/23 2337

## 2023-08-02 NOTE — Discharge Instructions (Signed)
As discussed, with today's findings concerning for a small brain lesion and seizure-like activity you are starting a new medication.  Please take this medication as directed and discussed with both your oncologist, and our other oncology specialist, Dr. Barbaraann Cao.  Return here for concerning changes in your condition.

## 2023-08-02 NOTE — ED Provider Notes (Signed)
[place] EMERGENCY DEPARTMENT AT [facility] Provider Note   CSN: [id number] Arrival date & time: 08/02/23  1503     History  Chief Complaint  Patient presents with   Tremors    Linda [name] is a 57 y.o. female.  HPI Patient with ongoing therapy for GI malignancy now presents with concern for an episode of tremor, blurred vision.  Symptoms last about 40 minutes, have resolved entirely.  Patient's history is notable for ongoing therapy for gastric malignancy.  She has had prior abdominal surgery, has an abdominal wound that she notes is not an issue today, and this morning 6 hours ago had removal of infusion pump which had been running for the past 2 days.  That was the first chemotherapy session she has experienced. No history of stroke.  Currently no discomfort.     Home Medications Prior to Admission medications   Medication Sig Start Date End Date Taking? Authorizing Provider  acetaminophen (TYLENOL) 500 MG tablet Take 2 tablets (1,000 mg total) by mouth every 8 (eight) hours as needed for mild pain or fever. 07/16/23  Yes Juliet Rude, PA-C  levETIRAcetam (KEPPRA) 500 MG tablet Take 1 tablet (500 mg total) by mouth 2 (two) times daily. 08/02/23 09/01/23 Yes Gerhard Munch, MD  lidocaine-prilocaine (EMLA) cream Apply to affected area once 07/22/23  Yes Malachy Mood, MD  methocarbamol (ROBAXIN) 500 MG tablet Take 2 tablets (1,000 mg total) by mouth every 8 (eight) hours as needed for muscle spasms. 07/16/23  Yes Trixie Deis R, PA-C  ondansetron (ZOFRAN) 8 MG tablet Take 1 tablet (8 mg total) by mouth every 8 (eight) hours as needed for nausea or vomiting. Start on the third day after chemotherapy. 07/22/23  Yes Malachy Mood, MD  oxyCODONE (OXY IR/ROXICODONE) 5 MG immediate release tablet Take 1-2 tablets (5-10 mg total) by mouth every 6 (six) hours as needed for moderate pain or severe pain (5mg  moderate, 10mg  severe). Patient taking differently: Take 5 mg by mouth  every 6 (six) hours as needed for moderate pain or severe pain. 07/22/23  Yes Malachy Mood, MD  pantoprazole (PROTONIX) 40 MG tablet Take 1 tablet (40 mg total) by mouth 2 (two) times daily. 07/16/23 09/14/23 Yes Jonah Blue, MD  polyethylene glycol powder (GLYCOLAX/MIRALAX) 17 GM/SCOOP powder Take 17 g by mouth daily mixed in liquid as directed. Patient taking differently: Take 17 g by mouth daily as needed for mild constipation. 07/17/23  Yes Jonah Blue, MD  prochlorperazine (COMPAZINE) 10 MG tablet Take 1 tablet (10 mg total) by mouth every 6 (six) hours as needed for nausea or vomiting. 07/22/23  Yes Malachy Mood, MD  simethicone (MYLICON) 80 MG chewable tablet Chew 1 tablet (80 mg total) by mouth 4 (four) times daily as needed for flatulence. 07/16/23  Yes Jonah Blue, MD      Allergies    Patient has no known allergies.    Review of Systems   Review of Systems  Physical Exam Updated Vital Signs BP 114/77   Pulse 88   Temp 98.7 F (37.1 C) (Oral)   Resp (!) 21   Ht 5\' 4"  (1.626 m)   Wt 83.9 kg   SpO2 100%   BMI 31.75 kg/m  Physical Exam Vitals and nursing note reviewed.  Constitutional:      General: She is not in acute distress.    Appearance: She is well-developed.  HENT:     Head: Normocephalic and atraumatic.  Eyes:  Conjunctiva/sclera: Conjunctivae normal.  Cardiovascular:     Rate and Rhythm: Normal rate and regular rhythm.  Pulmonary:     Effort: Pulmonary effort is normal. No respiratory distress.     Breath sounds: Normal breath sounds. No stridor.  Abdominal:     General: There is no distension.     Comments: Lower abdominal wound clean, dry, with small area of dehiscence.  Skin:    General: Skin is warm and dry.  Neurological:     General: No focal deficit present.     Mental Status: She is alert and oriented to person, place, and time.     Cranial Nerves: No cranial nerve deficit.  Psychiatric:        Mood and Affect: Mood normal.     ED  Results / Procedures / Treatments   Labs (all labs ordered are listed, but only abnormal results are displayed) Labs Reviewed  BASIC METABOLIC PANEL - Abnormal; Notable for the following components:      Result Value   Potassium 3.4 (*)    Glucose, Bld 131 (*)    BUN <5 (*)    Creatinine, Ser 1.11 (*)    GFR, Estimated 59 (*)    Anion gap 16 (*)    All other components within normal limits  CBC - Abnormal; Notable for the following components:   RDW 23.9 (*)    All other components within normal limits  URINALYSIS, ROUTINE W REFLEX MICROSCOPIC - Abnormal; Notable for the following components:   APPearance HAZY (*)    Hgb urine dipstick SMALL (*)    Ketones, ur 5 (*)    Protein, ur 30 (*)    Leukocytes,Ua TRACE (*)    Bacteria, UA RARE (*)    All other components within normal limits  CBG MONITORING, ED - Abnormal; Notable for the following components:   Glucose-Capillary 130 (*)    All other components within normal limits  HCG, SERUM, QUALITATIVE  PROTIME-INR  APTT  RAPID URINE DRUG SCREEN, HOSP PERFORMED  CBG MONITORING, ED    EKG None  Radiology MR Brain W and Wo Contrast  Result Date: 08/02/2023 CLINICAL DATA:  Initial evaluation for neuro deficit, stroke suspected. EXAM: MRI HEAD WITHOUT AND WITH CONTRAST TECHNIQUE: Multiplanar, multiecho pulse sequences of the brain and surrounding structures were obtained without and with intravenous contrast. CONTRAST:  8mL GADAVIST GADOBUTROL 1 MMOL/ML IV SOLN COMPARISON:  None available. FINDINGS: Brain: Cerebral volume within normal limits. Few punctate foci of T2/FLAIR hyperintensity noted within the subcortical white matter of the anterior right frontal lobe, nonspecific, but minimal in nature, less than is typically seen for patient age. 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 No significant  associated T2/FLAIR signal or enhancement. No other evidence for acute or subacute ischemia. No other acute or chronic intracranial blood products. No mass lesion, midline shift or mass effect. No hydrocephalus or extra-axial fluid collection. Empty sella noted. No abnormal enhancement or evidence for intracranial metastatic disease. Vascular: Major intracranial vascular flow voids are well maintained. Skull and upper cervical spine: Craniocervical junction within normal limits. Decreased T1 signal intensity seen throughout the visualized bone marrow, nonspecific, but most commonly related to anemia, smoking or obesity. No visible focal marrow replacing lesion. No scalp soft tissue abnormality. Sinuses/Orbits: Globes and orbital soft tissues within normal limits. Paranasal sinuses are clear. No mastoid effusion. Other:  None. IMPRESSION: 1. 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 Differential considerations for this finding are broad, including changes related to seizure, toxic metabolic derangement, demyelination, or less likely ischemia. 2. No other acute intracranial abnormality. No evidence for intracranial metastatic disease. 3. Empty sella. Electronically Signed   By: Rise Mu M.D.   On: 08/02/2023 20:10    Procedures Procedures    Medications Ordered in ED Medications  levETIRAcetam (KEPPRA) IVPB 1000 mg/100 mL premix (has no administration in time range)  gadobutrol (GADAVIST) 1 MMOL/ML injection 8 mL (8 mLs Intravenous Contrast Given 08/02/23 1916)    ED Course/ Medical Decision Making/ A&P                                 Medical Decision Making Adult female with active cancer therapy presents with concern for an episode of neurologic dysfunction, now resolved.  Concern for TIA versus medication effect versus brain met or vasospasm.  Cardiac 105 sinus tach abnormal Pulse ox  97% room air normal   Amount and/or Complexity of Data Reviewed External Data Reviewed: notes.    Details: Oncology imaging abdomen pelvis CT notes below Labs: ordered. Decision-making details documented in ED Course. Radiology: ordered and independent interpretation performed. Decision-making details documented in ED Course.  Risk Prescription drug management. Decision regarding hospitalization. Diagnosis or treatment significantly limited by social determinants of health.  IMPRESSION: 1. Right lower quadrant drain is in appropriate position. No significant residual abscess cavity is seen. 2. Unchanged malignant midline pelvic mass. 9:22 PM Patient awake, alert, now accompanied by her daughter.  We had a lengthy conversation with the findings and have discussed the patient's case with Dr.Bhagat, and the patient has consented to this consultation. Patient's MRI which I reviewed abnormal with likely chemo toxic area and suspicion for seizure secondary to this.  Patient and I discussed admission, though she is amenable to, has a strong preference for initiation of IV Keppra here, follow-up with Dr. Barbaraann Cao as well as with her oncologist. Patient is not driving, labs otherwise notable for dehydration, which was also discussed.  Patient discharged to follow-up closely with oncology.        Final Clinical Impression(s) / ED Diagnoses Final diagnoses:  Seizure-like activity (HCC)  Brain lesion    Rx / DC Orders ED Discharge Orders          Ordered    levETIRAcetam (KEPPRA) 500 MG tablet  2 times daily        08/02/23 2109              Gerhard Munch, MD 08/02/23 2123

## 2023-08-02 NOTE — Consult Note (Signed)
NEUROLOGY CONSULT NOTE   Date of service: August 02, 2023 Patient Name: Linda [name] MRN:  [id number] DOB:  1966-10-09 Chief Complaint: "New onset facial twitching" Requesting Provider: Glyn Ade, MD  History of Present Illness  Linda [name] is a 57 y.o. 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  Sudden onset of clamminess and sweatiness at 2 PM with right eye and right arm twitching.  Arm twitching resolved but right perioral twitching never completely resolved, maybe only going away for a few minutes at a time but has essentially been constant since onset.  Subsequently at 11 PM had jacksonian march of this activity into the right arm and leg and I came to evaluate the patient  Was on a 48-hour infusion of chemo which was started Thursday and pump was removed at 10 AM this morning    ROS  Comprehensive ROS performed and pertinent positives documented in HPI; limited as obtained from daughter post Ativan and loading dose of Keppra so patient was fairly sleepy and had limited ability to participate  Past History   Past Medical History:  Diagnosis Date   Anemia    Hypertension    Pre-diabetes 02/2022    Past Surgical History:  Procedure Laterality Date   BREAST LUMPECTOMY WITH RADIOACTIVE SEED LOCALIZATION Left 06/10/2022   Procedure: LEFT BREAST LUMPECTOMY WITH RADIOACTIVE SEED LOCALIZATION;  Surgeon: Griselda Miner, MD;  Location: [facility];  Service: General;  Laterality: Left;   CESAREAN SECTION     x4   IR IMAGING GUIDED PORT INSERTION  07/08/2023   LAPAROTOMY N/A 06/30/2023   Procedure: EXPLORATORY LAPAROTOMY, SMALL BOWEL RESECTION, AND EXCISIONAL BIOPSIES OF MULTIPLE OMENTAL MASSES;  Surgeon: Berna Bue, MD;  Location: [facility];  Service: General;  Laterality: N/A;   TUBAL LIGATION      Family History: Family History  Problem Relation Age of Onset   Memory loss Mother    Hypertension Father    Hypertension Brother    Diabetes Maternal Grandmother    Hypertension Maternal Grandmother    Hypertension Maternal Grandfather    Colon cancer Neg Hx    Stomach cancer Neg Hx    Esophageal cancer Neg Hx     Social History  reports that she has never smoked. She has never used smokeless tobacco. She reports that she does not drink alcohol and does not use drugs.  No Known Allergies  Medications   Current Facility-Administered Medications:    levETIRAcetam (KEPPRA) IVPB 1000 mg/100 mL premix, 1,000 mg, Intravenous, Q12H, Gerhard Munch, MD, Stopped at 08/02/23 2218   LORazepam (ATIVAN) injection 2 mg, 2 mg, Intravenous, Q4H PRN, Mable Dara L, MD  Current Outpatient Medications:    acetaminophen (TYLENOL) 500 MG tablet, Take 2 tablets (1,000 mg total) by mouth every 8 (eight) hours as needed for mild pain or fever., Disp: , Rfl:    levETIRAcetam (KEPPRA) 500 MG tablet, Take 1 tablet (500 mg total) by mouth 2 (two) times daily., Disp: 60 tablet, Rfl: 0   lidocaine-prilocaine (EMLA) cream, Apply to affected area once, Disp: 30 g, Rfl: 3   methocarbamol (ROBAXIN) 500 MG tablet, Take 2 tablets (1,000 mg total) by mouth every 8 (eight) hours as needed for muscle spasms., Disp: 30 tablet, Rfl: 0   ondansetron (ZOFRAN) 8 MG tablet, Take  1 tablet (8 mg total) by mouth every 8 (eight) hours as needed for nausea or vomiting. Start on the third day after chemotherapy., Disp: 30 tablet, Rfl: 1   oxyCODONE (OXY IR/ROXICODONE) 5 MG immediate release tablet, Take 1-2 tablets (5-10 mg total) by mouth every 6 (six) hours as needed for moderate pain or severe pain (5mg  moderate, 10mg  severe). (Patient taking differently: Take 5 mg by mouth every 6 (six) hours as needed for moderate pain or severe pain.), Disp: 60 tablet,  Rfl: 0   pantoprazole (PROTONIX) 40 MG tablet, Take 1 tablet (40 mg total) by mouth 2 (two) times daily., Disp: 60 tablet, Rfl: 1   polyethylene glycol powder (GLYCOLAX/MIRALAX) 17 GM/SCOOP powder, Take 17 g by mouth daily mixed in liquid as directed. (Patient taking differently: Take 17 g by mouth daily as needed for mild constipation.), Disp: 238 g, Rfl: 0   prochlorperazine (COMPAZINE) 10 MG tablet, Take 1 tablet (10 mg total) by mouth every 6 (six) hours as needed for nausea or vomiting., Disp: 30 tablet, Rfl: 1   simethicone (MYLICON) 80 MG chewable tablet, Chew 1 tablet (80 mg total) by mouth 4 (four) times daily as needed for flatulence., Disp: 100 tablet, Rfl: 0  Vitals   Vitals:   08/02/23 1545 08/02/23 1800 08/02/23 2205 08/02/23 2206  BP:  114/77 (!) 148/115   Pulse: 100 88 85   Resp: (!) 24 (!) 21 18 (!) 22  Temp:    98.4 F (36.9 C)  TempSrc:    Oral  SpO2: 97% 100% 100%   Weight:      Height:        Body mass index is 31.75 kg/m.  Physical Exam   Constitutional: Appears fatigued Psych: Affect appropriate to situation.  Eyes: No scleral injection.  HENT: No OP obstruction.  Ongoing right lip twitching at times involving the right eye Head: Normocephalic.  Cardiovascular: Normal rate and regular rhythm.  Respiratory: Effort normal, non-labored breathing.  GI: Healing incision, no tender to light palpation   Neurologic Examination   Mental status: Very sleepy post Ativan and full Keppra loading dose in process, oriented to person/time/situation although too sleepy to provide significant details her casual speech appears fluent and she is able to name and repeat.  With increased antiseizure medications were more delayed speech and difficulty recognizing some of her family members at times Cranial nerves: visual fields full to confrontation, EOMI, tongue midline, face with a slight right facial droop, symmetric response to light eyelash brush Motor: 4/5 in the right  upper and lower extremity, 5/5 on the left Sensory: Reports symmetric sensation in the bilateral arms and legs Reflexes: 2+ and symmetric patellar.  Toes mute bilaterally Coordination: Intact finger-to-nose and heel-to-shin bilaterally  Labs   CBC:  Recent Labs  Lab 07/31/23 0903 08/02/23 1624  WBC 6.8 5.7  NEUTROABS 4.7  --   HGB 10.8* 12.1  HCT 34.9* 39.0  MCV 84.5 84.6  PLT 298 268    Basic Metabolic Panel:  Lab Results  Component Value Date   NA 138 08/02/2023   K 3.4 (L) 08/02/2023   CO2 24 08/02/2023   GLUCOSE 131 (H) 08/02/2023   BUN <5 (L) 08/02/2023   CREATININE 1.11 (H) 08/02/2023   CALCIUM 9.8 08/02/2023   GFRNONAA 59 (L) 08/02/2023   Lipid Panel:  Lab Results  Component Value Date   LDLCALC 164 (H) 05/16/2022   HgbA1c:  Lab Results  Component Value Date   HGBA1C 5.5  07/09/2023   Urine Drug Screen:     Component Value Date/Time   LABOPIA NONE DETECTED 08/02/2023 1624   COCAINSCRNUR NONE DETECTED 08/02/2023 1624   LABBENZ NONE DETECTED 08/02/2023 1624   AMPHETMU NONE DETECTED 08/02/2023 1624   THCU NONE DETECTED 08/02/2023 1624   LABBARB NONE DETECTED 08/02/2023 1624    Alcohol Level No results found for: "ETH" INR  Lab Results  Component Value Date   INR 1.1 08/02/2023   APTT  Lab Results  Component Value Date   APTT 30 08/02/2023   AED levels: No results found for: "PHENYTOIN", "ZONISAMIDE", "LAMOTRIGINE", "LEVETIRACETA"  MRI Brain(Personally reviewed): 1. 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 Differential considerations for this finding are broad, including changes related to seizure, toxic metabolic derangement, demyelination, or less likely ischemia. 2. No other acute intracranial abnormality. No evidence for intracranial metastatic disease. 3. Empty sella.    Impression   TAKESHIA MURR is a 57 y.o. 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  Examination highly concerning for focal motor status.  No clear leptomeningeal carcinomatosis on MRI brain, though given clinical examination query whether there is a very subtle finding just lateral to the hand knob (see key imange above).   Recommendations  - Ativan 2 mg x 1 dose, then 2 mg q4hr PRN for 2 doses, notify neuro if used - Additional Keppra 3.5 g to achieve 4.5 g loading dose - Next Vimpat if refractory   - LTM EEG - Would proceed with lumbar puncture for flow and cytopathology including basic studies, given the need for these advanced studies this lumbar puncture will likely need to be completed Monday - Will try to avoid intubation, family discussion goals of care  - Neurology will follow along closely  Update, due to ongoing facial twitching after Keppra loading dose was given, added Vimpat 200 mg loading dose followed by 100 mg twice daily; notably patient was very sleepy but still arousable and able to follow simple commands prior to Vimpat decision  Update: Per EEG technician report, no further facial twitching during the duration of hookup ______________________________________________________________________    Nunzio Cory MD-PhD [facility] [phone]   CRITICAL CARE Performed by: Gordy Councilman   Total critical care time: 60 minutes  Critical care time was exclusive of separately billable procedures and treating other patients.  Critical care was necessary to treat or prevent imminent or life-threatening deterioration.  Critical care was time spent personally by me on the following activities: development of treatment plan with patient and/or surrogate as well as nursing, discussions with consultants, evaluation of patient's response to treatment, examination of patient,  obtaining history from patient or surrogate, ordering and performing treatments and interventions, ordering and review of laboratory studies, ordering and review of radiographic studies, pulse oximetry and re-evaluation of patient's condition.

## 2023-08-02 NOTE — ED Triage Notes (Addendum)
Pt here via [facility] from home.  At 1350 pt began experiencing tremors , slow speech, blurred vision and a R eye "tic".  Stroke screen negative.  Pt remained ao x 4.  Vs stable.  Cbg 80.  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  Had chemo pump removed today at 10 am.    Pt ao x 4.  Mild tremors present.

## 2023-08-02 NOTE — Consult Note (Shared)
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  Stroke screen negative.  Pt remained ao x 4.  Vs stable.  Cbg 80.   Now fully back to baseline per EDP; discussed with Dr. Jeraldine Loots in person, who received consent from the patient to discuss with me  MRI brain personally reviewed, agree with radiology:   1. 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 Differential considerations for this finding are broad, including changes related to seizure, toxic metabolic derangement, demyelination, or less likely ischemia. 2. No other acute intracranial abnormality. No evidence for intracranial metastatic disease. 3. Empty sella.  Given no FLARE change and MRI obtained nearly 6 hours after symptom onset as well as very atypical location for stroke and atypical history for stroke, I do favor this to be seizure related change or change related to toxic/metabolic issues potentially from chemotherapy.  I do think close outpatient follow-up with Dr. Barbaraann Cao will be helpful as repeat imaging will help clarify the underlying etiology of the MRI findings.  In the interim I think it is safest to start an antiseizure medication  Basic Metabolic Panel: Recent Labs  Lab 07/31/23 0903 08/02/23 1624  NA 138 138  K 3.5 3.4*  CL 102 98  CO2 28 24  GLUCOSE 99 131*  BUN <5* <5*  CREATININE 0.66 1.11*  CALCIUM 9.2 9.8    CBC: Recent Labs  Lab  07/31/23 0903 08/02/23 1624  WBC 6.8 5.7  NEUTROABS 4.7  --   HGB 10.8* 12.1  HCT 34.9* 39.0  MCV 84.5 84.6  PLT 298 268    Coagulation Studies: Recent Labs    08/02/23 1624  LABPROT 14.7  INR 1.1     Recommendations: - Keppra 500 mg BID  Estimated Creatinine Clearance: 60 mL/min (A) (by C-G formula based on SCr of 1.11 mg/dL (H)).   CrCl 80 to 130 mL/minute/1.73 m2: 500 mg to 1.5 g every 12 hours.  CrCl 50 to <80 mL/minute/1.73 m2: 500 mg to 1 g every 12 hours.  CrCl 30 to <50 mL/minute/1.73 m2: 250 to 750 mg every 12 hours.  CrCl 15 to <30 mL/minute/1.73 m2: 250 to 500 mg every 12 hours.  CrCl <15 mL/minute/1.73 m2: 250 to 500 mg every 24 hours (expert opinion). - Repeat imaging, timeline per outpatient provider, favor referral to Dr. Barbaraann Cao  - Strict return precautions  Standard seizure precautions: Per [facility] statutes, patients with seizures are not allowed to drive until  they have been seizure-free for six months. Use caution when using heavy equipment or power tools. Avoid working on ladders or at heights. Take showers instead of baths. Ensure the water temperature is not too high on the home water heater. Do not go swimming alone. When caring for infants or small children, sit down when holding, feeding, or changing them to minimize risk of injury to the child in the event you have a seizure.  To reduce risk of seizures, maintain good sleep hygiene avoid  alcohol and illicit drug use, take all anti-seizure medications as prescribed.   Linda Dare MD-PhD [facility] [phone] Available 7 PM to 7 AM, outside of these hours please call Neurologist on call as listed on Amion.   These are curbside recommendations based upon the information readily available in the chart on brief review as well as history and examination information provided to me by requesting provider and do not replace a full detailed consult, please do not hesitate to request  full consult if felt to be needed   20 minutes were spent in total on this patient's care, the majority in discussion with Dr. Jeraldine Loots ****

## 2023-08-03 ENCOUNTER — Encounter: Payer: Self-pay | Admitting: Hematology

## 2023-08-03 ENCOUNTER — Emergency Department (HOSPITAL_COMMUNITY): Payer: BC Managed Care – PPO

## 2023-08-03 DIAGNOSIS — Z9049 Acquired absence of other specified parts of digestive tract: Secondary | ICD-10-CM | POA: Diagnosis not present

## 2023-08-03 DIAGNOSIS — C786 Secondary malignant neoplasm of retroperitoneum and peritoneum: Secondary | ICD-10-CM | POA: Diagnosis present

## 2023-08-03 DIAGNOSIS — R569 Unspecified convulsions: Secondary | ICD-10-CM | POA: Diagnosis not present

## 2023-08-03 DIAGNOSIS — C787 Secondary malignant neoplasm of liver and intrahepatic bile duct: Secondary | ICD-10-CM | POA: Diagnosis present

## 2023-08-03 DIAGNOSIS — E860 Dehydration: Secondary | ICD-10-CM | POA: Diagnosis present

## 2023-08-03 DIAGNOSIS — Z5986 Financial insecurity: Secondary | ICD-10-CM | POA: Diagnosis not present

## 2023-08-03 DIAGNOSIS — D509 Iron deficiency anemia, unspecified: Secondary | ICD-10-CM | POA: Diagnosis present

## 2023-08-03 DIAGNOSIS — Z8249 Family history of ischemic heart disease and other diseases of the circulatory system: Secondary | ICD-10-CM | POA: Diagnosis not present

## 2023-08-03 DIAGNOSIS — T451X5A Adverse effect of antineoplastic and immunosuppressive drugs, initial encounter: Secondary | ICD-10-CM

## 2023-08-03 DIAGNOSIS — Z79899 Other long term (current) drug therapy: Secondary | ICD-10-CM | POA: Diagnosis not present

## 2023-08-03 DIAGNOSIS — E66811 Obesity, class 1: Secondary | ICD-10-CM | POA: Diagnosis present

## 2023-08-03 DIAGNOSIS — G939 Disorder of brain, unspecified: Secondary | ICD-10-CM

## 2023-08-03 DIAGNOSIS — Z6831 Body mass index (BMI) 31.0-31.9, adult: Secondary | ICD-10-CM | POA: Diagnosis not present

## 2023-08-03 DIAGNOSIS — N179 Acute kidney failure, unspecified: Secondary | ICD-10-CM

## 2023-08-03 DIAGNOSIS — I10 Essential (primary) hypertension: Secondary | ICD-10-CM | POA: Diagnosis present

## 2023-08-03 DIAGNOSIS — Z833 Family history of diabetes mellitus: Secondary | ICD-10-CM | POA: Diagnosis not present

## 2023-08-03 DIAGNOSIS — G4089 Other seizures: Secondary | ICD-10-CM | POA: Diagnosis present

## 2023-08-03 DIAGNOSIS — E119 Type 2 diabetes mellitus without complications: Secondary | ICD-10-CM | POA: Diagnosis present

## 2023-08-03 LAB — BASIC METABOLIC PANEL
Anion gap: 14 (ref 5–15)
BUN: 5 mg/dL — ABNORMAL LOW (ref 6–20)
CO2: 21 mmol/L — ABNORMAL LOW (ref 22–32)
Calcium: 8.9 mg/dL (ref 8.9–10.3)
Chloride: 102 mmol/L (ref 98–111)
Creatinine, Ser: 0.8 mg/dL (ref 0.44–1.00)
GFR, Estimated: >60 mL/min (ref >60)
Glucose, Bld: 101 mg/dL — ABNORMAL HIGH (ref 70–99)
Potassium: 3.7 mmol/L (ref 3.5–5.1)
Sodium: 137 mmol/L (ref 135–145)

## 2023-08-03 LAB — CSF CELL COUNT WITH DIFFERENTIAL
RBC Count, CSF: 1 /mm3 — ABNORMAL HIGH
RBC Count, CSF: 22 /mm3 — ABNORMAL HIGH
Tube #: 1
Tube #: 4
WBC, CSF: 1 /mm3 (ref 0–5)
WBC, CSF: 2 /mm3 (ref 0–5)

## 2023-08-03 LAB — LACTIC ACID, PLASMA: Lactic Acid, Venous: 1.1 mmol/L (ref 0.5–1.9)

## 2023-08-03 LAB — MENINGITIS/ENCEPHALITIS PANEL (CSF)

## 2023-08-03 LAB — HEPATIC FUNCTION PANEL
ALT: 15 U/L (ref 0–44)
AST: 23 U/L (ref 15–41)
Albumin: 2.8 g/dL — ABNORMAL LOW (ref 3.5–5.0)
Alkaline Phosphatase: 81 U/L (ref 38–126)
Bilirubin, Direct: 0.1 mg/dL (ref 0.0–0.2)
Indirect Bilirubin: 0.6 mg/dL (ref 0.3–0.9)
Total Bilirubin: 0.7 mg/dL (ref 0.3–1.2)
Total Protein: 7.2 g/dL (ref 6.5–8.1)

## 2023-08-03 LAB — CBC
HCT: 36.4 % (ref 36.0–46.0)
Hemoglobin: 10.9 g/dL — ABNORMAL LOW (ref 12.0–15.0)
MCH: 26.4 pg (ref 26.0–34.0)
MCHC: 29.9 g/dL — ABNORMAL LOW (ref 30.0–36.0)
MCV: 88.1 fL (ref 80.0–100.0)
Platelets: 220 10*3/uL (ref 150–400)
RBC: 4.13 MIL/uL (ref 3.87–5.11)
RDW: 23.9 % — ABNORMAL HIGH (ref 11.5–15.5)
WBC: 4.6 10*3/uL (ref 4.0–10.5)
nRBC: 0 % (ref 0.0–0.2)

## 2023-08-03 LAB — MAGNESIUM: Magnesium: 1.9 mg/dL (ref 1.7–2.4)

## 2023-08-03 LAB — TSH: TSH: 0.456 u[IU]/mL (ref 0.350–4.500)

## 2023-08-03 LAB — PROTEIN AND GLUCOSE, CSF
Glucose, CSF: 61 mg/dL (ref 40–70)
Total  Protein, CSF: 18 mg/dL (ref 15–45)

## 2023-08-03 LAB — AMMONIA: Ammonia: 49 umol/L — ABNORMAL HIGH (ref 9–35)

## 2023-08-03 LAB — PHOSPHORUS: Phosphorus: 4.9 mg/dL — ABNORMAL HIGH (ref 2.5–4.6)

## 2023-08-03 LAB — CBG MONITORING, ED
Glucose-Capillary: 92 mg/dL (ref 70–99)
Glucose-Capillary: 97 mg/dL (ref 70–99)

## 2023-08-03 MED ORDER — ONDANSETRON HCL 4 MG/2ML IJ SOLN: 4.0000 mg | Freq: Four times a day (QID) | INTRAMUSCULAR | Status: DC | PRN

## 2023-08-03 MED ORDER — POTASSIUM CHLORIDE CRYS ER 20 MEQ PO TBCR
40.0000 meq | EXTENDED_RELEASE_TABLET | Freq: Once | ORAL | Status: AC
  Administered 2023-08-03: 40 meq via ORAL
  Filled 2023-08-03: qty 2

## 2023-08-03 MED ORDER — OXYCODONE HCL 5 MG PO TABS: 5.0000 mg | ORAL_TABLET | Freq: Four times a day (QID) | ORAL | Status: DC | PRN

## 2023-08-03 MED ORDER — PANTOPRAZOLE SODIUM 40 MG PO TBEC
40.0000 mg | DELAYED_RELEASE_TABLET | Freq: Two times a day (BID) | ORAL | Status: DC
  Administered 2023-08-03 – 2023-08-04 (×2): 40 mg via ORAL
  Filled 2023-08-03 (×2): qty 1

## 2023-08-03 MED ORDER — INSULIN ASPART 100 UNIT/ML IJ SOLN: 0.0000 [IU] | Freq: Three times a day (TID) | INTRAMUSCULAR | Status: DC

## 2023-08-03 MED ORDER — SODIUM CHLORIDE 0.9 % IV SOLN: INTRAVENOUS | Status: DC

## 2023-08-03 MED ORDER — SODIUM CHLORIDE 0.9 % IV BOLUS
1000.0000 mL | Freq: Once | INTRAVENOUS | Status: AC
  Administered 2023-08-03: 1000 mL via INTRAVENOUS

## 2023-08-03 MED ORDER — ACETAMINOPHEN 500 MG PO TABS
1000.0000 mg | ORAL_TABLET | Freq: Four times a day (QID) | ORAL | Status: DC | PRN
  Administered 2023-08-03: 1000 mg via ORAL
  Filled 2023-08-03: qty 2

## 2023-08-03 MED ORDER — SODIUM CHLORIDE 0.9 % IV SOLN
200.0000 mg | Freq: Once | INTRAVENOUS | Status: AC
  Administered 2023-08-03: 200 mg via INTRAVENOUS
  Filled 2023-08-03: qty 20

## 2023-08-03 MED ORDER — HEPARIN SODIUM (PORCINE) 5000 UNIT/ML IJ SOLN: 5000.0000 [IU] | Freq: Three times a day (TID) | INTRAMUSCULAR | Status: DC

## 2023-08-03 MED ORDER — POLYETHYLENE GLYCOL 3350 17 G PO PACK: 17.0000 g | PACK | Freq: Every day | ORAL | Status: DC | PRN

## 2023-08-03 MED ORDER — SODIUM CHLORIDE 0.9 % IV SOLN
100.0000 mg | Freq: Two times a day (BID) | INTRAVENOUS | Status: DC
  Administered 2023-08-03 (×2): 100 mg via INTRAVENOUS
  Filled 2023-08-03 (×4): qty 10

## 2023-08-03 NOTE — ED Notes (Signed)
Pt is u/t have post residual completed d/t wound located near the site to scan.

## 2023-08-03 NOTE — ED Notes (Signed)
Neuro provider at bedside performing LP.

## 2023-08-03 NOTE — ED Provider Notes (Signed)
Patient care assumed at midnight.  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  Will observe in the emergency department for resolution of symptoms.  Patient required additional Vimpat but does now have seizure control.  Plan to admit to medicine service for ongoing observation. D/w Dr. Lazarus Salines, who plans to admit for ongoing care.    Tilden Fossa, MD 08/03/23 [phone]

## 2023-08-03 NOTE — Consult Note (Signed)
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 No LOC. AO x3 currently. We are consulted for possibly 5FU related seizure.  5FU neurotoxicity is very rare and had been reported. It is possibly patient's dehydration may have make her more susceptible. She reported not eating or drinking much for the past 3 days and had about 4 episodes of vomiting.  She has not replace much fluid.  Focal seizure Agree with Neurologic work up with LP for flow and pathology EEG monitoring Appreciate neurology and hospitalist management Will inquire on DPD testing. Currently no other apparent AEs Currently stable and AO x 3 without s/o seizure.  May consider Uridine triacetate if signs of seizure activity reported  AKI From dehydration. 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 Mosetta Putt as outpatient to discuss future plan  Thank you for the consult. Will follow with you.  All questions were answered.   Melven Sartorius, MD 08/03/2023 7:38 AM   CHIEF COMPLAINTS/PURPOSE OF ADMISSION seizure  HISTORY OF PRESENTING ILLNESS:  Linda [name] 57 y.o. female is admitted for seizure. Patient is a pleasant 57 y.o. 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 Patient's son was in the room. Report of sudden onset right facial twitching and spread to RUE about 2 PM on Saturday. It lasted about over 5 minutes. blurred vision at the beginning resolved. She was brought to ED for evaluation. Symptoms had resolved. No LOC. Report additional right side twitching about 11PM.  She was given ativan, keppra followed by vimpat. Currently she is AO x 3 and answering questions appropriate, on EEG monitor at 7AM.  No headaches, visual change, hearing change, focal weakness and rest of ROS negative as below.  No personal history of alcohol, seizure, TBI, new supplement or other medication. She denies recent fever, or new infectious like symptoms. No family history of seizure.  Patient reports not eating and drinking much for the past 3 days and had about 4 episodes of vomiting.  She has not replace much fluid.  MRI showed below: no clear metastatic disease reported. 1. 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 Differential considerations for this finding are broad, including changes related to seizure, toxic metabolic derangement, demyelination, or less likely ischemia. 2. No other acute intracranial abnormality. No evidence for intracranial metastatic disease. 3. Empty sella.  Labs showed AKI.   Summary of oncologic history as follows: Oncology History  Cancer of ileum with carcinomatosis  07/10/2023 Initial Diagnosis   Cancer of ileum with carcinomatosis   07/22/2023 Cancer Staging   Staging form: Small Intestine - Adenocarcinoma, AJCC 8th Edition - Pathologic: Stage IV (pT4, pNX, pM1) - Signed by Malachy Mood, MD on 07/22/2023 Histologic grade (G): G2 Histologic grading system: 4  grade system   07/31/2023 -  Chemotherapy   Patient is on Treatment Plan : COLORECTAL FOLFOX q14d       MEDICAL HISTORY:  Past Medical History:  Diagnosis Date    Anemia    Hypertension    Pre-diabetes 02/2022    SURGICAL HISTORY: Past Surgical History:  Procedure Laterality Date   BREAST LUMPECTOMY WITH RADIOACTIVE SEED LOCALIZATION Left 06/10/2022   Procedure: LEFT BREAST LUMPECTOMY WITH RADIOACTIVE SEED LOCALIZATION;  Surgeon: Griselda Miner, MD;  Location: [facility];  Service: General;  Laterality: Left;   CESAREAN SECTION     x4   IR IMAGING GUIDED PORT INSERTION  07/08/2023   LAPAROTOMY N/A 06/30/2023   Procedure: EXPLORATORY LAPAROTOMY, SMALL BOWEL RESECTION, AND EXCISIONAL BIOPSIES OF MULTIPLE OMENTAL MASSES;  Surgeon: Berna Bue, MD;  Location: [facility];  Service: General;  Laterality: N/A;   TUBAL LIGATION      SOCIAL HISTORY: Social History   Socioeconomic History   Marital status: Single    Spouse name: Not on file   Number of children: 4   Years of education: Not on file   Highest education level: Some college, no degree  Occupational History    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  Tobacco Use   Smoking status: Never   Smokeless tobacco: Never  Vaping Use   Vaping status: Never Used  Substance and Sexual Activity   Alcohol use: No   Drug use: No   Sexual activity: Not Currently    Birth control/protection: Surgical  Other Topics Concern   Not on file  Social History Narrative   Not on file   Social Determinants of Health   Financial Resource Strain: Medium Risk (07/30/2023)   Overall Financial Resource Strain (CARDIA)    Difficulty of Paying Living Expenses: Somewhat hard  Food Insecurity: No Food Insecurity (07/30/2023)   Hunger Vital Sign    Worried About Running Out of Food in the Last Year: Never true    Ran Out of Food in the Last Year: Never true  Transportation Needs: No Transportation Needs (07/30/2023)   PRAPARE - Administrator, Civil Service (Medical): No    Lack of Transportation (Non-Medical): No  Physical Activity: Inactive (07/30/2023)   Exercise Vital  Sign    Days of Exercise per Week: 0 days    Minutes of Exercise per Session: 0 min  Stress: No Stress Concern Present (07/30/2023)   [employer] of Occupational Health - Occupational Stress Questionnaire    Feeling of Stress : Only a little  Social Connections: Moderately Isolated (07/30/2023)   Social Connection and Isolation Panel [NHANES]    Frequency of Communication with Friends and Family: More than three times a week    Frequency of Social Gatherings with Friends and Family: More than three times a week    Attends Religious Services: More than 4 times per year    Active Member of [employer] or Organizations: No    Attends Engineer, structural: Not on file    Marital Status: Never married  Intimate Partner Violence: Not At Risk (06/20/2023)   Humiliation, Afraid, Rape, and Kick questionnaire    Fear of Current or Ex-Partner: No    Emotionally Abused: No    Physically Abused: No    Sexually Abused: No    FAMILY HISTORY: Family History  Problem Relation Age of Onset   Memory loss Mother    Hypertension Father    Hypertension Brother  Diabetes Maternal Grandmother    Hypertension Maternal Grandmother    Hypertension Maternal Grandfather    Colon cancer Neg Hx    Stomach cancer Neg Hx    Esophageal cancer Neg Hx     ALLERGIES:  has No Known Allergies.  MEDICATIONS:  Current Facility-Administered Medications  Medication Dose Route Frequency Provider Last Rate Last Admin   0.9 %  sodium chloride infusion   Intravenous Continuous Dolly Rias, MD 100 mL/hr at 08/03/23 0508 New Bag at 08/03/23 4098   acetaminophen (TYLENOL) tablet 1,000 mg  1,000 mg Oral Q6H PRN Dolly Rias, MD       lacosamide (VIMPAT) 100 mg in sodium chloride 0.9 % 25 mL IVPB  100 mg Intravenous Q12H Bhagat, Srishti L, MD       levETIRAcetam (KEPPRA) IVPB 1000 mg/100 mL premix  1,000 mg Intravenous Q12H Gerhard Munch, MD   Stopped at 08/02/23 2218   LORazepam (ATIVAN) injection 2 mg   2 mg Intravenous Q4H PRN Bhagat, Srishti L, MD   2 mg at 08/02/23 2252   ondansetron (ZOFRAN) injection 4 mg  4 mg Intravenous Q6H PRN Dolly Rias, MD       oxyCODONE (Oxy IR/ROXICODONE) immediate release tablet 5 mg  5 mg Oral Q6H PRN Segars, Christiane Ha, MD       pantoprazole (PROTONIX) EC tablet 40 mg  40 mg Oral BID Dolly Rias, MD       polyethylene glycol (MIRALAX / GLYCOLAX) packet 17 g  17 g Oral Daily PRN Dolly Rias, MD       Current Outpatient Medications  Medication Sig Dispense Refill   acetaminophen (TYLENOL) 500 MG tablet Take 2 tablets (1,000 mg total) by mouth every 8 (eight) hours as needed for mild pain or fever.     levETIRAcetam (KEPPRA) 500 MG tablet Take 1 tablet (500 mg total) by mouth 2 (two) times daily. 60 tablet 0   lidocaine-prilocaine (EMLA) cream Apply to affected area once 30 g 3   methocarbamol (ROBAXIN) 500 MG tablet Take 2 tablets (1,000 mg total) by mouth every 8 (eight) hours as needed for muscle spasms. 30 tablet 0   ondansetron (ZOFRAN) 8 MG tablet Take 1 tablet (8 mg total) by mouth every 8 (eight) hours as needed for nausea or vomiting. Start on the third day after chemotherapy. 30 tablet 1   oxyCODONE (OXY IR/ROXICODONE) 5 MG immediate release tablet Take 1-2 tablets (5-10 mg total) by mouth every 6 (six) hours as needed for moderate pain or severe pain (5mg  moderate, 10mg  severe). (Patient taking differently: Take 5 mg by mouth every 6 (six) hours as needed for moderate pain or severe pain.) 60 tablet 0   pantoprazole (PROTONIX) 40 MG tablet Take 1 tablet (40 mg total) by mouth 2 (two) times daily. 60 tablet 1   polyethylene glycol powder (GLYCOLAX/MIRALAX) 17 GM/SCOOP powder Take 17 g by mouth daily mixed in liquid as directed. (Patient taking differently: Take 17 g by mouth daily as needed for mild constipation.) 238 g 0   prochlorperazine (COMPAZINE) 10 MG tablet Take 1 tablet (10 mg total) by mouth every 6 (six) hours as needed for nausea or  vomiting. 30 tablet 1   simethicone (MYLICON) 80 MG chewable tablet Chew 1 tablet (80 mg total) by mouth 4 (four) times daily as needed for flatulence. 100 tablet 0    REVIEW OF SYSTEMS:   Constitutional: Denies fevers Eyes: blurred vision at the beginning resolved. Denies visual change Ears, nose, mouth, throat,  and face: Denies sore throat Respiratory: Denies cough, shortness of breath or wheezes Cardiovascular: Denies palpitation, chest discomfort or chest pain Gastrointestinal:  + nausea, vomiting No diarrhea, constipation, abdominal pain. Dressing in place GU: Denies any dysuria, frequency, hematuria Skin: Denies infection Lymphatics: Denies mass Neurological: Denies numbness, tingling or new weaknesses All other systems were reviewed with the patient and are negative.  PHYSICAL EXAMINATION: ECOG PERFORMANCE STATUS: 1 - Symptomatic but completely ambulatory  Vitals:   08/03/23 0400 08/03/23 0500  BP: 113/81   Pulse: 89   Resp: (!) 27   Temp:  99.1 F (37.3 C)  SpO2: 94%    Filed Weights   08/02/23 1516  Weight: 185 lb (83.9 kg)    GENERAL: alert, no distress and comfortable SKIN: skin color normal.  EYES: normal, sclera clear OROPHARYNX: no exudate, moist NECK: supple. No mass LYMPH:  no palpable cervical lymphadenopathy LUNGS: normal breathing effort. HEART: pulse normal ABDOMEN: abdomen soft, non-tender and non-distended. Dressing over abdomen Musculoskeletal:  no lower extremity edema NEURO: alert & oriented x 3 with fluent speech; no focal motor/sensory deficits Strength and sensation equal bilaterally.  Able to perform heel-to-shin.  LABORATORY DATA:  I have reviewed the data as listed Lab Results  Component Value Date   WBC 4.6 08/03/2023   HGB 10.9 (L) 08/03/2023   HCT 36.4 08/03/2023   MCV 88.1 08/03/2023   PLT 220 08/03/2023   Recent Labs    07/10/23 0421 07/11/23 0417 07/14/23 0322 07/15/23 0337 07/31/23 0903 08/02/23 1624 08/03/23 0614   NA 133*   < > 133*   < > 138 138 137  K 3.7   < > 3.9   < > 3.5 3.4* 3.7  CL 101   < > 100   < > 102 98 102  CO2 25   < > 24   < > 28 24 21*  GLUCOSE 115*   < > 147*   < > 99 131* 101*  BUN 7   < > 9   < > <5* <5* 5*  CREATININE 0.75   < > 0.64   < > 0.66 1.11* 0.80  CALCIUM 7.8*   < > 8.3*   < > 9.2 9.8 8.9  GFRNONAA >60   < > >60   < > >60 59* >60  PROT 6.5  --  7.5  --  8.0  --   --   ALBUMIN 1.9*  --  2.1*  --  3.4*  --   --   AST 41  --  74*  --  13*  --   --   ALT 29  --  69*  --  12  --   --   ALKPHOS 125  --  222*  --  107  --   --   BILITOT 0.4  --  0.5  --  0.6  --   --    < > = values in this interval not displayed.    RADIOGRAPHIC STUDIES: I have personally reviewed the radiological images as listed and agreed with the findings in the report. MR Brain W and Wo Contrast  Result Date: 08/02/2023 CLINICAL DATA:  Initial evaluation for neuro deficit, stroke suspected. EXAM: MRI HEAD WITHOUT AND WITH CONTRAST TECHNIQUE: Multiplanar, multiecho pulse sequences of the brain and surrounding structures were obtained without and with intravenous contrast. CONTRAST:  8mL GADAVIST GADOBUTROL 1 MMOL/ML IV SOLN COMPARISON:  None available. FINDINGS: Brain: Cerebral volume within normal limits. Few  punctate foci of T2/FLAIR hyperintensity noted within the subcortical white matter of the anterior right frontal lobe, nonspecific, but minimal in nature, less than is typically seen for patient age. 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 No significant associated T2/FLAIR signal or enhancement. No other evidence for acute or subacute ischemia. No other acute or chronic intracranial blood products. No mass lesion, midline shift or mass effect. No hydrocephalus or extra-axial fluid collection. Empty sella noted. No abnormal enhancement or evidence for intracranial  metastatic disease. Vascular: Major intracranial vascular flow voids are well maintained. Skull and upper cervical spine: Craniocervical junction within normal limits. Decreased T1 signal intensity seen throughout the visualized bone marrow, nonspecific, but most commonly related to anemia, smoking or obesity. No visible focal marrow replacing lesion. No scalp soft tissue abnormality. Sinuses/Orbits: Globes and orbital soft tissues within normal limits. Paranasal sinuses are clear. No mastoid effusion. Other: None. IMPRESSION: 1. 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 Differential considerations for this finding are broad, including changes related to seizure, toxic metabolic derangement, demyelination, or less likely ischemia. 2. No other acute intracranial abnormality. No evidence for intracranial metastatic disease. 3. Empty sella. Electronically Signed   By: Rise Mu M.D.   On: 08/02/2023 20:10   CT PELVIS W CONTRAST  Result Date: 07/16/2023 CLINICAL DATA:  57 year old woman with history of right lower quadrant postsurgical abscess underwent CT-guided drain placement on 07/11/2023. EXAM: CT PELVIS WITH CONTRAST TECHNIQUE: Multidetector CT imaging of the pelvis was performed using the standard protocol following the bolus administration of intravenous contrast. RADIATION DOSE REDUCTION: This exam was performed according to the departmental dose-optimization program which includes automated exposure control, adjustment of the mA and/or kV according to patient size and/or use of iterative reconstruction technique. CONTRAST:  OMNIPAQUE IOHEXOL 300 MG/ML  SOLN COMPARISON:  07/09/2023 FINDINGS: Urinary Tract:  No abnormality visualized. Bowel: Right lower quadrant drain is present. No significant residual abscess cavity is seen. Mixed solid cystic midline pelvic mass is again seen consistent  with metastatic disease. This mass is either invading or originating from the distal sigmoid colon. Vascular/Lymphatic: No significant abnormality of the vasculature. Reproductive: Small amount of endometrial fluid is noted. Hypoechoic region along the posterior lower uterine segment best seen on image 75 of series 6 measuring 1.9 x 1.2 cm is suspicious for direct invasion from pelvic mass. Other: Mild anterior abdominal subcutaneous emphysema consistent with recent postop status. Musculoskeletal: No acute osseous abnormality. 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 IMPRESSION: 1. Right lower quadrant drain is in appropriate position. No significant residual abscess cavity is seen. 2. Unchanged malignant midline pelvic mass. Electronically Signed   By: Acquanetta Belling M.D.   On: 07/16/2023 17:03   DG Sinus/Fist Tube Chk-Non GI  Result Date: 07/16/2023 INDICATION: 57 year old woman developed postop public abscess and underwent CT-guided drain placement on 07/11/2023. She has had less than 10 mL of output and CT shows resolution of abscess. EXAM: Fluoroscopic abscessogram MEDICATIONS: The patient is currently admitted to the hospital and receiving intravenous antibiotics. The antibiotics were administered within an appropriate time frame prior to the initiation of the procedure. ANESTHESIA/SEDATION: None COMPLICATIONS: None immediate. PROCEDURE: Informed written consent was obtained from the patient after a thorough discussion of the procedural risks, benefits and alternatives. All questions  were addressed. A timeout was performed prior to the initiation of the procedure. Scout image shows the right pelvic drain in appropriate position. Contrast administered through the drain showed minimal abscess cavity with no fistulous communication. The drain was removed without difficulty. The site was covered with sterile dressing. IMPRESSION: Pelvic abscessogram shows no residual abscess. No fistulous communication  was seen. The drain was removed. Electronically Signed   By: Acquanetta Belling M.D.   On: 07/16/2023 15:18   CT GUIDED PERITONEAL/RETROPERITONEAL FLUID DRAIN BY PERC CATH  Result Date: 07/11/2023 INDICATION: 57 year old female with history of right lower quadrant postsurgical abdominal fluid collection concerning for abscess. EXAM: CT PERC DRAIN PERITONEAL ABCESS COMPARISON:  None Available. MEDICATIONS: The patient is currently admitted to the hospital and receiving intravenous antibiotics. The antibiotics were administered within an appropriate time frame prior to the initiation of the procedure. ANESTHESIA/SEDATION: Moderate (conscious) sedation was employed during this procedure. A total of Versed 2 mg and Fentanyl 100 mcg was administered intravenously. Moderate Sedation Time: 19 minutes. The patient's level of consciousness and vital signs were monitored continuously by radiology nursing throughout the procedure under my direct supervision. CONTRAST:  None COMPLICATIONS: None immediate. PROCEDURE: RADIATION DOSE REDUCTION: This exam was performed according to the departmental dose-optimization program which includes automated exposure control, adjustment of the mA and/or kV according to patient size and/or use of iterative reconstruction technique. Informed written consent was obtained from the patient after a discussion of the risks, benefits and alternatives to treatment. The patient was placed supine on the CT gantry and a pre procedural CT was performed re-demonstrating the known abscess/fluid collection within the right lower quadrant. The procedure was planned. A timeout was performed prior to the initiation of the procedure. The right lower quadrant was prepped and draped in the usual sterile fashion. The overlying soft tissues were anesthetized with 1% lidocaine with epinephrine. Appropriate trajectory was planned with the use of a 22 gauge spinal needle. An 18 gauge trocar needle was advanced into the  abscess/fluid collection and a short Amplatz super stiff wire was coiled within the collection. Appropriate positioning was confirmed with a limited CT scan. The tract was serially dilated allowing placement of a 10 [place] all-purpose drainage catheter. Appropriate positioning was confirmed with a limited postprocedural CT scan. Approximately 50 ml of purulent fluid was aspirated. The tube was connected to a bulb suction and sutured in place. A dressing was placed. The patient tolerated the procedure well without immediate post procedural complication. IMPRESSION: Successful CT guided placement of a 10 French all purpose drain catheter into the right lower quadrant fluid collection with aspiration of approximately 50 mL of purulent fluid. Samples were sent to the laboratory as requested by the ordering clinical team. Marliss Coots, MD [facility] Radiology Electronically Signed   By: Marliss Coots M.D.   On: 07/11/2023 15:21   CT ABDOMEN PELVIS W CONTRAST  Result Date: 07/09/2023 CLINICAL DATA:  Abdominal pain, status post exploratory laparotomy with small bowel resection and omental biopsy EXAM: CT ABDOMEN AND PELVIS WITH CONTRAST TECHNIQUE: Multidetector CT imaging of the abdomen and pelvis was performed using the standard protocol following bolus administration of intravenous contrast. RADIATION DOSE REDUCTION: This exam was performed according to the departmental dose-optimization program which includes automated exposure control, adjustment of the mA and/or kV according to patient size and/or use of iterative reconstruction technique. CONTRAST:  OMNIPAQUE IOHEXOL 300 MG/ML  SOLN COMPARISON:  MRI abdomen dated 07/03/2023. CT abdomen/pelvis dated  06/22/2023. FINDINGS: Lower chest: Mild bibasilar atelectasis. Hepatobiliary: Subcentimeter lesion in the posterior right hepatic lobe (series 10/image 22), suspicious for metastasis on MR. Layering tiny  gallstones (series 2/image 34), without associated linear changes. No intrahepatic or extrahepatic duct dilatation. Pancreas: Within normal limits. Spleen: Within normal limits Adrenals/Urinary Tract: Adrenal glands within normal limits. Kidneys are within normal limits.  No hydronephrosis. Bladder is within normal limits with trace nondependent gas. Stomach/Bowel: Stomach is within normal limits. No evidence of bowel obstruction, improved. Status post small bowel resection with suture line in the right lateral abdomen (series 2/image 60). Adjacent long segment wall thickening involving small bowel in the right lower abdomen (series 2/image 85), new, raising concern for at risk bowel. Adjacent wall thickening/inflammatory changes involving the cecum (series 2, image 54), new. No pneumatosis or free air. Adjacent fluid collection in the right lower quadrant measuring 3.5 x 3.1 x 5.5 cm (series 2/image 64), possibly reflecting a postsurgical abscess, noting that the appendix is not visualized. Mild sigmoid diverticulosis, without diverticulitis. Vascular/Lymphatic: No evidence of abdominal aortic aneurysm. Small retroperitoneal lymph nodes which do not meet pathologic CT size criteria. Reproductive: Uterus is grossly unremarkable The ovaries are poorly visualized. Other: 5.4 x 7.8 cm complex cystic peritoneal implant in the dependent pelvis (series 2/image 94). Scattered peritoneal nodularity measuring up to 18 mm medial left in the anterior abdominal wall (series 2/image 54). Some of the recent peritoneal implants on CT are no longer evident, likely surgically excised. Musculoskeletal: Visualized osseous structures are within normal limits. IMPRESSION: Status post small bowel resection with suture line in the right lateral abdomen. Adjacent long segment wall thickening involving the distal small bowel and cecal wall thickening, new, raising concern for at risk bowel. No pneumatosis or free air. Consider surgical  evaluation as clinically warranted. 5.5 cm right lower quadrant fluid collection, possibly reflecting a postsurgical abscess, noting that the appendix is not visualized. 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 Additional ancillary findings as above. Electronically Signed   By: Charline Bills M.D.   On: 07/09/2023 18:40   IR IMAGING GUIDED PORT INSERTION  Result Date: 07/08/2023 INDICATION: 57 year old female with history of colon cancer presenting for Port-A-Cath placement. EXAM: IMPLANTED PORT A CATH PLACEMENT WITH ULTRASOUND AND FLUOROSCOPIC GUIDANCE COMPARISON:  None Available. MEDICATIONS: None. ANESTHESIA/SEDATION: Moderate (conscious) sedation was employed during this procedure. A total of Versed 2 mg and Fentanyl 100 mcg was administered intravenously. Moderate Sedation Time: 15 minutes. The patient's level of consciousness and vital signs were monitored continuously by radiology nursing throughout the procedure under my direct supervision. CONTRAST:  None FLUOROSCOPY TIME:  Five mGy COMPLICATIONS: None immediate. PROCEDURE: The procedure, risks, benefits, and alternatives were explained to the patient. Questions regarding the procedure were encouraged and answered. The patient understands and consents to the procedure. The right neck and chest were prepped with chlorhexidine in a sterile fashion, and a sterile drape was applied covering the operative field. Maximum barrier sterile technique with sterile gowns and gloves were used for the procedure. A timeout was performed prior to the initiation of the procedure. Ultrasound was used to examine the jugular vein which was compressible and free of internal echoes. A skin marker was used to demarcate the planned venotomy and port pocket incision sites. Local anesthesia was provided to these sites and the subcutaneous  tunnel track with 1% lidocaine with 1:100,000 epinephrine. A small incision  was created at the jugular access site and blunt dissection was performed of the subcutaneous tissues. Under ultrasound guidance, the jugular vein was accessed with a 21 ga micropuncture needle and an 0.018" wire was inserted to the superior vena cava. Real-time ultrasound guidance was utilized for vascular access including the acquisition of a permanent ultrasound image documenting patency of the accessed vessel. A 5 Fr micopuncture set was then used, through which a 0.035" Rosen wire was passed under fluoroscopic guidance into the inferior vena cava. An 8 Fr dilator was then placed over the wire. A subcutaneous port pocket was then created along the upper chest wall utilizing a combination of sharp and blunt dissection. The pocket was irrigated with sterile saline, packed with gauze, and observed for hemorrhage. A single lumen plastic power injectable port was chosen for placement. The 8 Fr catheter was tunneled from the port pocket site to the venotomy incision. The port was placed in the pocket. The external catheter was trimmed to appropriate length. The dilator was exchanged for an 8 Fr peel-away sheath under fluoroscopic guidance. The catheter was then placed through the sheath and the sheath was removed. Final catheter positioning was confirmed and documented with a fluoroscopic spot radiograph. The port was accessed with a Huber needle, aspirated, and flushed with heparinized saline. The deep dermal layer of the port pocket incision was closed with interrupted 3-0 Vicryl suture. The skin was opposed with a running subcuticular 4-0 Monocryl suture. Dermabond was then placed over the port pocket and neck incisions. The patient tolerated the procedure well without immediate post procedural complication. FINDINGS: After catheter placement, the tip lies within the superior cavoatrial junction. The catheter aspirates and flushes normally  and is ready for immediate use. IMPRESSION: Successful placement of a power injectable Port-A-Cath via the right internal jugular vein. The catheter is ready for immediate use. Marliss Coots, MD [facility] Radiology Electronically Signed   By: Marliss Coots M.D.   On: 07/08/2023 15:15

## 2023-08-03 NOTE — H&P (Addendum)
History and Physical    Linda [name]:[id number] DOB: September 06, 1966 DOA: 08/02/2023  PCP: Arnette Felts, [name]   Patient coming from: Home   Chief Complaint:  Chief Complaint  Patient presents with   Tremors    HPI:  Linda [name] is a 57 y.o. 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 coli treated with Augmentin, recently initiated on chemotherapy 10/10 with 5-FU and leucovorin.  Other notable recent history of dysfunctional uterine bleeding, GI bleeding, IDA, and chronic problems including diabetes, hypertension, obesity.  She was brought in from home after episode of right facial twitching.  Initial episode started 2 PM 10/12, on my interview reported that this first episode became generalized to the right arm and left arm but she maintained awareness.  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  At time of my evaluation patient and family member initially asleep.  They deny any recurrent twitching/seizure activity.  She denies any recent headaches, vision changes, speech changes, numbness, weakness.  No history of fall or head injury.  No prior history of seizure disorder   Review of Systems:  ROS complete and negative except as marked above   No Known Allergies  Prior to Admission medications   Medication Sig Start Date End Date Taking? Authorizing Provider  acetaminophen (TYLENOL) 500 MG tablet Take 2 tablets (1,000 mg total) by mouth every 8 (eight) hours as needed for mild pain or fever. 07/16/23  Yes Juliet Rude, PA-C  levETIRAcetam (KEPPRA) 500 MG tablet Take 1 tablet (500 mg total) by mouth 2 (two) times daily. 08/02/23  09/01/23 Yes Gerhard Munch, MD  lidocaine-prilocaine (EMLA) cream Apply to affected area once 07/22/23  Yes Malachy Mood, MD  methocarbamol (ROBAXIN) 500 MG tablet Take 2 tablets (1,000 mg total) by mouth every 8 (eight) hours as needed for muscle spasms. 07/16/23  Yes Trixie Deis R, PA-C  ondansetron (ZOFRAN) 8 MG tablet Take 1 tablet (8 mg total) by mouth every 8 (eight) hours as needed for nausea or vomiting. Start on the third day after chemotherapy. 07/22/23  Yes Malachy Mood, MD  oxyCODONE (OXY IR/ROXICODONE) 5 MG immediate release tablet Take 1-2 tablets (5-10 mg total) by mouth every 6 (six) hours as needed for moderate pain or severe pain (5mg  moderate, 10mg  severe). Patient taking differently: Take 5 mg by mouth every 6 (six) hours as needed for moderate pain or severe pain. 07/22/23  Yes Malachy Mood, MD  pantoprazole (PROTONIX) 40 MG tablet Take 1 tablet (40 mg total) by mouth 2 (two) times daily. 07/16/23 09/14/23 Yes Jonah Blue, MD  polyethylene glycol powder (GLYCOLAX/MIRALAX) 17 GM/SCOOP powder Take 17 g by mouth daily mixed in liquid as directed. Patient taking differently: Take 17 g by mouth daily as needed for mild constipation. 07/17/23  Yes Jonah Blue, MD  prochlorperazine (COMPAZINE) 10 MG tablet Take 1 tablet (10 mg total) by mouth every 6 (six) hours as needed for nausea or vomiting. 07/22/23  Yes Malachy Mood, MD  simethicone (MYLICON) 80 MG chewable tablet Chew 1 tablet (80 mg total) by mouth 4 (four) times daily as needed for flatulence. 07/16/23  Yes Jonah Blue, MD    Past  Medical History:  Diagnosis Date   Anemia    Hypertension    Pre-diabetes 02/2022    Past Surgical History:  Procedure Laterality Date   BREAST LUMPECTOMY WITH RADIOACTIVE SEED LOCALIZATION Left 06/10/2022   Procedure: LEFT BREAST LUMPECTOMY WITH RADIOACTIVE SEED LOCALIZATION;  Surgeon: Griselda Miner, MD;  Location: [facility];  Service: General;  Laterality: Left;   CESAREAN  SECTION     x4   IR IMAGING GUIDED PORT INSERTION  07/08/2023   LAPAROTOMY N/A 06/30/2023   Procedure: EXPLORATORY LAPAROTOMY, SMALL BOWEL RESECTION, AND EXCISIONAL BIOPSIES OF MULTIPLE OMENTAL MASSES;  Surgeon: Berna Bue, MD;  Location: [facility];  Service: General;  Laterality: N/A;   TUBAL LIGATION       reports that she has never smoked. She has never used smokeless tobacco. She reports that she does not drink alcohol and does not use drugs.  Family History  Problem Relation Age of Onset   Memory loss Mother    Hypertension Father    Hypertension Brother    Diabetes Maternal Grandmother    Hypertension Maternal Grandmother    Hypertension Maternal Grandfather    Colon cancer Neg Hx    Stomach cancer Neg Hx    Esophageal cancer Neg Hx      Physical Exam: Vitals:   08/02/23 2206 08/03/23 0100 08/03/23 0400 08/03/23 0500  BP:  132/89 113/81   Pulse:  95 89   Resp: (!) 22 20 (!) 27   Temp: 98.4 F (36.9 C) 98.8 F (37.1 C)  99.1 F (37.3 C)  TempSrc: Oral     SpO2:  100% 94%   Weight:      Height:        Gen: Somnolent, but awakens to voice, falls asleep easily. NAD CV: Regular, normal S1, S2, no murmurs  Resp: Normal WOB, CTAB  Abd: Ex lap dressing is clean and dry, normoactive, nontender MSK: Symmetric, no edema  Skin: No rashes or lesions to exposed skin  Neuro: EEG in place. alert and interactive, CN II through XII intact, speech is clear, motor is 5 out of 5 and symmetric, sensation is intact and equal to fine touch Psych: euthymic, appropriate    Data review:   Labs reviewed, notable for:   Creatinine up to 1.1, baseline 0.4 Hemoglobin/hct  also uptrending  WBC wnl   Micro:  Results for orders placed or performed during the hospital encounter of 06/20/23  Aerobic/Anaerobic Culture w Gram Stain (surgical/deep wound)     Status: None   Collection Time: 07/11/23 10:48 AM   Specimen: Abdomen; Abscess  Result Value Ref Range Status   Specimen  Description   Final    ABDOMEN ABSCESS Performed at [facility], [address] [place], [place] [address]    Special Requests   Final    NONE Performed at [facility], [address], [place], [place] [address]    Gram Stain   Final    ABUNDANT WBC PRESENT, PREDOMINANTLY PMN MODERATE GRAM POSITIVE COCCI IN PAIRS MODERATE GRAM POSITIVE RODS FEW GRAM NEGATIVE RODS    Culture   Final    MODERATE ESCHERICHIA COLI MODERATE LACTOBACILLUS SPECIES Standardized susceptibility testing for this organism is not available. MODERATE BACTEROIDES SPECIES NOT FRAGILIS BETA LACTAMASE POSITIVE Performed at [facility], [address], [place], [place] [address]    Report Status 07/13/2023 FINAL  Final   Organism ID, Bacteria ESCHERICHIA COLI  Final  Susceptibility   Escherichia coli - MIC*    AMPICILLIN <=2 SENSITIVE Sensitive     CEFEPIME <=0.12 SENSITIVE Sensitive     CEFTAZIDIME <=1 SENSITIVE Sensitive     CEFTRIAXONE <=0.25 SENSITIVE Sensitive     CIPROFLOXACIN <=0.25 SENSITIVE Sensitive     GENTAMICIN <=1 SENSITIVE Sensitive     IMIPENEM <=0.25 SENSITIVE Sensitive     TRIMETH/SULFA <=20 SENSITIVE Sensitive     AMPICILLIN/SULBACTAM <=2 SENSITIVE Sensitive     PIP/TAZO <=4 SENSITIVE Sensitive     * MODERATE ESCHERICHIA COLI    Imaging reviewed:  MR Brain W and Wo Contrast  Result Date: 08/02/2023 CLINICAL DATA:  Initial evaluation for neuro deficit, stroke suspected. EXAM: MRI HEAD WITHOUT AND WITH CONTRAST TECHNIQUE: Multiplanar, multiecho pulse sequences of the brain and surrounding structures were obtained without and with intravenous contrast. CONTRAST:  8mL GADAVIST GADOBUTROL 1 MMOL/ML IV SOLN COMPARISON:  None available. FINDINGS: Brain: Cerebral volume within normal limits. Few punctate foci of T2/FLAIR hyperintensity noted within the subcortical white matter of the anterior right frontal lobe, nonspecific, but minimal in nature,  less than is typically seen for patient age. 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 No significant associated T2/FLAIR signal or enhancement. No other evidence for acute or subacute ischemia. No other acute or chronic intracranial blood products. No mass lesion, midline shift or mass effect. No hydrocephalus or extra-axial fluid collection. Empty sella noted. No abnormal enhancement or evidence for intracranial metastatic disease. Vascular: Major intracranial vascular flow voids are well maintained. Skull and upper cervical spine: Craniocervical junction within normal limits. Decreased T1 signal intensity seen throughout the visualized bone marrow, nonspecific, but most commonly related to anemia, smoking or obesity. No visible focal marrow replacing lesion. No scalp soft tissue abnormality. Sinuses/Orbits: Globes and orbital soft tissues within normal limits. Paranasal sinuses are clear. No mastoid effusion. Other: None. IMPRESSION: 1. 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 Differential considerations for this finding are broad, including changes related to seizure, toxic metabolic derangement, demyelination, or less likely ischemia. 2. No other acute intracranial abnormality. No evidence for intracranial metastatic disease. 3. Empty sella. Electronically Signed   By: Rise Mu M.D.   On: 08/02/2023 20:10    ED Course:  While in ED noted to have continued right facial twitching with additional seizure-like activity and right arm right leg.  Neurology consulted and patient was loaded on AEDs, received total of 4.5 g Keppra, lacosamide 200 mg IV x 1, Ativan 2 mg IV x 1, placed on LTM.  At time of RFA focal status appears to be resolved and she is felt to be appropriate  for progressive level per neurology   Assessment/Plan:  57 y.o. 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 coli treated with Augmentin, recently initiated on chemotherapy 10/10 with 5-FU and leucovorin.  Other notable recent history of dysfunctional uterine bleeding, GI bleeding, IDA, and chronic problems including diabetes, hypertension, obesity. 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   Focal motor seizure, with aborted status epilepticus  MRI with area of 12 mm  focal diffusion restriction in the central splenium /corpus callosum without associated enhancement, per rads differential including seizure, toxic metabolic, demyelinating, less likely ischemic.  Per neurology felt potentially could be related to cytotoxic effect of 5-FU.  Although this is not a classic toxicity of fluorouracil, has been associated with seizures.  -Neurology management of focal seizures, s/p keppra + lacosamide load. currently on Keppra 1 g IV every 12 hours, Lacosamide 100 mg IV q 12 hr. LTM is running.  -Oncology consult, will see her today.  I discussed her case overnight with on-call oncologist Dr. Cherly Hensen re: possible fluorouracil toxicity and possible use of antidote uridine triacetate. Considering use if recurrent seizure  -I have contacted pharmacy we do not have uridine triacetate in any of our hospital pharmacies in the surrounding area.  I have asked pharmacy to investigate any additional sources or we could obtain this medication, and ETA.  Asked to coordinate with oncoming hospitalist - Neurology recommending for LP including basic studies, flow cytometry, cytopath. Ordered for IR who is currently deferring to another service for bedside LP   AKI stage 2,  prerenal Baseline creatinine approximately 0.4-0.6; uptrending to 1.1 at time of admission.  Reported recent nausea and vomiting after 5-FU. 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 See above discussion re: possible Fluorouracil toxicity  - Oncology consulted, will see today   Recent intraabd abscess with e Coli - completed abx treatment   Recent hx GIB / DUB  Anemia improved / hemoconcentrated  - Trend CBC, no recurrent bleeding   GOC  - Currently wishes to be full code   Chronic medical problems:  DM: SSI while inpatient   Body mass index is 31.75 kg/m. 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 Yes; Fluid consistency: Thin  Diet effective now       Question Answer Comment  Room service appropriate? Yes   Fluid consistency: Thin      08/03/23 0444           Family Communication:  Yes discussed with patient and family member at bedside   Consults:  Neurology, Oncology   Admission status:   Inpatient, Step Down Unit  Severity of Illness: The appropriate patient status for this patient is INPATIENT. Inpatient status is judged to be reasonable and necessary in order to provide the required intensity of service to ensure the patient's safety. The patient's presenting symptoms, physical exam findings, and initial radiographic and laboratory data in the context of their chronic comorbidities is felt to place them at high risk for further clinical deterioration. Furthermore, it is not anticipated that the patient will be  medically stable for discharge from the hospital within 2 midnights of admission.   * I certify that at the point of admission it is my clinical judgment that the patient will require inpatient hospital care spanning beyond 2 midnights from the point of admission due to high intensity of service, high risk for further deterioration  and high frequency of surveillance required.*   Dolly Rias, MD [facility]  How to contact the [facility] Attending or Consulting provider 7A - 7P or covering provider during after hours 7P -7A, for this patient.  Check the care team in [facility] and look for a) attending/consulting [facility] provider listed and b) the [facility] team listed Log into [email or web address] and use 's universal password to access. If you do not have the password, please contact the hospital operator. Locate the [facility] provider you are looking for under [facility] and page to a number that you can be directly reached. If you still have difficulty reaching the provider, please page the [facility] (Director on Call) for the Hospitalists listed on amion for assistance.  08/03/2023, 7:33 AM

## 2023-08-03 NOTE — ED Notes (Signed)
Hospitalist at bedside 

## 2023-08-03 NOTE — Progress Notes (Signed)
EEG maint complete. Wrote EEG call # and spoke with RN about calling once pt moves rooms.

## 2023-08-03 NOTE — Hospital Course (Signed)
Taken from prior notes.  Linda [name] is a 57 y.o. 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 coli treated with Augmentin, recently initiated on chemotherapy 10/10 with 5-FU and leucovorin.  Other notable recent history of dysfunctional uterine bleeding, GI bleeding, IDA, and chronic problems including diabetes, hypertension, obesity.  She was brought in from home after episode of right facial twitching.  Initial episode started 2 PM 10/12, on my interview reported that this first episode became generalized to the right arm and left arm but she maintained awareness.  In the emergency department she had continued facial twitching . Concern of focal motor seizure.  Neurology and oncology was consulted.  MRI with area of 12 mm focal diffusion restriction in the central splenium /corpus callosum without associated enhancement, per rads differential including seizure, toxic metabolic, demyelinating, less likely ischemic.  Per neurology felt potentially could be related to cytotoxic effect of 5-FU.  Although this is not a classic toxicity of fluorouracil, has been associated with seizures.   Seizure was managed with Keppra and lacosamide load, currently on Keppra 1 g IV every 12 hours, lacosamide 100 mg IV every 12 hours. LTM was also ordered and running. Per oncology if it is related to fluorouracil toxicity then she might need use of antidote uridine triacetate. Considering use if recurrent seizure.  Unfortunately [facility] pharmacy does not have that medicine.  Admitting provider did initiated with pharmacy for help [place] if we can have that medicine.  Neurology also recommended LP including basic studies, flow cytometry, cytopath.  IR fluoroscopy guided LP was ordered but unfortunately they want a bedside LP attempt before proceeding.  Message again sent to  neurology.

## 2023-08-03 NOTE — Procedures (Signed)
LUMBAR PUNCTURE (SPINAL TAP) PROCEDURE NOTE  Indication: To obtain CSF for diagnostics   Proceduralists: Lanae Boast, NP   Risks of the procedure were dicussed with the patient including post-LP headache, bleeding, infection, weakness/numbness of legs(radiculopathy), death.    Consent obtained from: patient    Procedure Note The patient was prepped and draped, and using sterile technique a 20 gauge quinke spinal needle was inserted in the L3-4 space.   Approximately 18 cc of CSF were obtained and sent for analysis.  Patient tolerated the procedure well and blood loss was minimal.    Lanae Boast, AGAC-NP [facility] Pager: [phone]

## 2023-08-03 NOTE — Progress Notes (Signed)
LTM EEG running - no initial skin breakdown - push button tested - neuro notified.  

## 2023-08-03 NOTE — ED Notes (Signed)
ED TO INPATIENT HANDOFF REPORT  ED Nurse Name and Phone #: Jess Barters [phone]  S Name/Age/Gender Linda [name] 57 y.o. female Room/Bed: 038C/038C  Code Status   Code Status: Full Code  Home/SNF/Other Home Patient oriented to: self, place, time, and situation Is this baseline? Yes   Triage Complete: Triage complete  Chief Complaint Focal seizure (HCC) [R56.9] Seizure-like activity (HCC) [R56.9]  Triage Note Pt here via [facility] from home.  At 1350 pt began experiencing tremors , slow speech, blurred vision and a R eye "tic".  Stroke screen negative.  Pt remained ao x 4.  Vs stable.  Cbg 80.  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  Had chemo pump removed today at 10 am.    Pt ao x 4.  Mild tremors present.   Allergies No Known Allergies  Level of Care/Admitting Diagnosis ED Disposition     ED Disposition  Admit   Condition  --   Comment  Hospital Area: [facility] [[id number]]  Level of Care: Med-Surg [[id number]]  May admit patient to [facility] or [name] if equivalent level of care is available:: Yes  Covid Evaluation: Asymptomatic - no recent exposure (last 10 days) testing not required  Diagnosis: Seizure-like activity [facility]) [[id number]]  Admitting Physician: Arnetha Courser [[id number]]  Attending Physician: Arnetha Courser [phone]  Certification:: I certify this patient will need inpatient services for at least 2 midnights          B Medical/Surgery History Past Medical History:  Diagnosis Date   Anemia    Hypertension    Pre-diabetes 02/2022   Past Surgical History:  Procedure Laterality Date   BREAST LUMPECTOMY WITH RADIOACTIVE SEED LOCALIZATION Left 06/10/2022   Procedure: LEFT BREAST LUMPECTOMY WITH RADIOACTIVE SEED LOCALIZATION;  Surgeon: Griselda Miner, MD;  Location: [facility];  Service: General;  Laterality: Left;   CESAREAN SECTION     x4   IR IMAGING GUIDED PORT INSERTION  07/08/2023   LAPAROTOMY N/A  06/30/2023   Procedure: EXPLORATORY LAPAROTOMY, SMALL BOWEL RESECTION, AND EXCISIONAL BIOPSIES OF MULTIPLE OMENTAL MASSES;  Surgeon: Berna Bue, MD;  Location: [facility];  Service: General;  Laterality: N/A;   TUBAL LIGATION       A IV Location/Drains/Wounds Patient Lines/Drains/Airways Status     Active Line/Drains/Airways     Name Placement date Placement time Site Days   Implanted Port 07/08/23 Right Chest 07/08/23  1321  Chest  26   Peripheral IV 08/02/23 22 G Posterior;Right Hand 08/02/23  1625  Hand  1   Closed System Drain 1 Right RLQ Bulb (JP) 10 Fr. 07/11/23  1040  RLQ  23            Intake/Output Last 24 hours  Intake/Output Summary (Last 24 hours) at 08/03/2023 1450 Last data filed at 08/03/2023 0853 Gross per 24 hour  Intake 1425 ml  Output 700 ml  Net 725 ml    Labs/Imaging Results for orders placed or performed during the hospital encounter of 08/02/23 (from the past 48 hour(s))  CBG monitoring, ED     Status: Abnormal   Collection Time: 08/02/23  3:13 PM  Result Value Ref Range   Glucose-Capillary 130 (H) 70 - 99 mg/dL    Comment: Glucose reference range applies only to samples taken after fasting for at least 8 hours.  Basic metabolic panel     Status: Abnormal   Collection Time: 08/02/23  4:24 PM  Result Value Ref  Range   Sodium 138 135 - 145 mmol/L   Potassium 3.4 (L) 3.5 - 5.1 mmol/L   Chloride 98 98 - 111 mmol/L   CO2 24 22 - 32 mmol/L   Glucose, Bld 131 (H) 70 - 99 mg/dL    Comment: Glucose reference range applies only to samples taken after fasting for at least 8 hours.   BUN <5 (L) 6 - 20 mg/dL   Creatinine, Ser 6.96 (H) 0.44 - 1.00 mg/dL   Calcium 9.8 8.9 - 29.5 mg/dL   GFR, Estimated 59 (L) >60 mL/min    Comment: (NOTE) Calculated using the CKD-EPI Creatinine Equation (2021)    Anion gap 16 (H) 5 - 15    Comment: Performed at [facility], [address] [place], [place] [address]  CBC     Status: Abnormal   Collection Time:  08/02/23  4:24 PM  Result Value Ref Range   WBC 5.7 4.0 - 10.5 K/uL   RBC 4.61 3.87 - 5.11 MIL/uL   Hemoglobin 12.1 12.0 - 15.0 g/dL   HCT 24.4 01.0 - 27.2 %   MCV 84.6 80.0 - 100.0 fL   MCH 26.2 26.0 - 34.0 pg   MCHC 31.0 30.0 - 36.0 g/dL   RDW 53.6 (H) 64.4 - 03.4 %   Platelets 268 150 - 400 K/uL   nRBC 0.0 0.0 - 0.2 %    Comment: Performed at [facility], [address], [place], [place] [address]  hCG, serum, qualitative     Status: None   Collection Time: 08/02/23  4:24 PM  Result Value Ref Range   Preg, Serum NEGATIVE NEGATIVE    Comment:        THE SENSITIVITY OF THIS METHODOLOGY IS >10 mIU/mL. Performed at [facility], [address], [place], [place] [address]   Protime-INR     Status: None   Collection Time: 08/02/23  4:24 PM  Result Value Ref Range   Prothrombin Time 14.7 11.4 - 15.2 seconds   INR 1.1 0.8 - 1.2    Comment: (NOTE) INR goal varies based on device and disease states. Performed at [facility], [address], [place], [place] [address]   APTT     Status: None   Collection Time: 08/02/23  4:24 PM  Result Value Ref Range   aPTT 30 24 - 36 seconds    Comment: Performed at [facility], [address] [place], [place] [address]  Urine rapid drug screen (hosp performed)not at [facility]     Status: None   Collection Time: 08/02/23  4:24 PM  Result Value Ref Range   Opiates NONE DETECTED NONE DETECTED   Cocaine NONE DETECTED NONE DETECTED   Benzodiazepines NONE DETECTED NONE DETECTED   Amphetamines NONE DETECTED NONE DETECTED   Tetrahydrocannabinol NONE DETECTED NONE DETECTED   Barbiturates NONE DETECTED NONE DETECTED    Comment: (NOTE) DRUG SCREEN FOR MEDICAL PURPOSES ONLY.  IF CONFIRMATION IS NEEDED FOR ANY PURPOSE, NOTIFY LAB WITHIN 5 DAYS.  LOWEST DETECTABLE LIMITS FOR URINE DRUG SCREEN Drug Class                     Cutoff (ng/mL) Amphetamine and metabolites    1000 Barbiturate and metabolites    200 Benzodiazepine                  200 Opiates and metabolites        300  Cocaine and metabolites        300 THC                            50 Performed at [facility], [address], [place], [place] [address]   Urinalysis, Routine w reflex microscopic -Urine, Clean Catch     Status: Abnormal   Collection Time: 08/02/23  4:24 PM  Result Value Ref Range   Color, Urine YELLOW YELLOW   APPearance HAZY (A) CLEAR   Specific Gravity, Urine 1.014 1.005 - 1.030   pH 6.0 5.0 - 8.0   Glucose, UA NEGATIVE NEGATIVE mg/dL   Hgb urine dipstick SMALL (A) NEGATIVE   Bilirubin Urine NEGATIVE NEGATIVE   Ketones, ur 5 (A) NEGATIVE mg/dL   Protein, ur 30 (A) NEGATIVE mg/dL   Nitrite NEGATIVE NEGATIVE   Leukocytes,Ua TRACE (A) NEGATIVE   RBC / HPF 0-5 0 - 5 RBC/hpf   WBC, UA 0-5 0 - 5 WBC/hpf   Bacteria, UA RARE (A) NONE SEEN   Squamous Epithelial / HPF 6-10 0 - 5 /HPF   Mucus PRESENT    Hyaline Casts, UA PRESENT     Comment: Performed at [facility], [address], [place], [place] [address]  TSH     Status: None   Collection Time: 08/03/23  6:14 AM  Result Value Ref Range   TSH 0.456 0.350 - 4.500 uIU/mL    Comment: Performed by a 3rd Generation assay with a functional sensitivity of <=0.01 uIU/mL. Performed at [facility], [address] [place], [place] [address]   Ammonia     Status: Abnormal   Collection Time: 08/03/23  6:14 AM  Result Value Ref Range   Ammonia 49 (H) 9 - 35 umol/L    Comment: Performed at [facility], [address], [place], [place] [address]  Lactic acid, plasma     Status: None   Collection Time: 08/03/23  6:14 AM  Result Value Ref Range   Lactic Acid, Venous 1.1 0.5 - 1.9 mmol/L    Comment: Performed at [facility], [address], [place], [place] [address]  Basic metabolic panel     Status: Abnormal   Collection Time: 08/03/23  6:14 AM  Result Value Ref Range   Sodium 137 135 - 145 mmol/L   Potassium 3.7 3.5 - 5.1 mmol/L   Chloride 102 98  - 111 mmol/L   CO2 21 (L) 22 - 32 mmol/L   Glucose, Bld 101 (H) 70 - 99 mg/dL    Comment: Glucose reference range applies only to samples taken after fasting for at least 8 hours.   BUN 5 (L) 6 - 20 mg/dL   Creatinine, Ser 1.32 0.44 - 1.00 mg/dL   Calcium 8.9 8.9 - 44.0 mg/dL   GFR, Estimated >10 >27 mL/min    Comment: (NOTE) Calculated using the CKD-EPI Creatinine Equation (2021)    Anion gap 14 5 - 15    Comment: Performed at [facility], [address] [place], [place] [address]  CBC     Status: Abnormal   Collection Time: 08/03/23  6:14 AM  Result Value Ref Range   WBC 4.6 4.0 - 10.5 K/uL   RBC 4.13 3.87 - 5.11 MIL/uL   Hemoglobin 10.9 (L) 12.0 - 15.0 g/dL   HCT 44.0 34.7 - 42.5 %   MCV 88.1 80.0 - 100.0  fL   MCH 26.4 26.0 - 34.0 pg   MCHC 29.9 (L) 30.0 - 36.0 g/dL   RDW 16.1 (H) 09.6 - 04.5 %   Platelets 220 150 - 400 K/uL   nRBC 0.0 0.0 - 0.2 %    Comment: Performed at [facility], [address], [place], [place] [address]  Magnesium     Status: None   Collection Time: 08/03/23  6:14 AM  Result Value Ref Range   Magnesium 1.9 1.7 - 2.4 mg/dL    Comment: Performed at [facility], [address], [place], [place] [address]  Phosphorus     Status: Abnormal   Collection Time: 08/03/23  6:14 AM  Result Value Ref Range   Phosphorus 4.9 (H) 2.5 - 4.6 mg/dL    Comment: Performed at [facility], [address] [place], [place] [address]  Hepatic function panel     Status: Abnormal   Collection Time: 08/03/23  6:20 AM  Result Value Ref Range   Total Protein 7.2 6.5 - 8.1 g/dL   Albumin 2.8 (L) 3.5 - 5.0 g/dL   AST 23 15 - 41 U/L   ALT 15 0 - 44 U/L   Alkaline Phosphatase 81 38 - 126 U/L   Total Bilirubin 0.7 0.3 - 1.2 mg/dL   Bilirubin, Direct 0.1 0.0 - 0.2 mg/dL   Indirect Bilirubin 0.6 0.3 - 0.9 mg/dL    Comment: Performed at [facility], [address] [place], [place] [address]  CBG monitoring, ED     Status: None   Collection  Time: 08/03/23 11:39 AM  Result Value Ref Range   Glucose-Capillary 92 70 - 99 mg/dL    Comment: Glucose reference range applies only to samples taken after fasting for at least 8 hours.   Overnight EEG with video  Result Date: 08/03/2023 Charlsie Quest, MD     08/03/2023  9:53 AM Patient Name: OASIS GOEHRING MRN: [id number] Epilepsy Attending: Charlsie Quest Referring Physician/Provider: Gordy Councilman, MD Duration: 08/03/2023 9528 to 08/03/2023 0945 Patient history:  57 yo F with sudden onset of clamminess and sweatiness at 2 PM with right eye and right arm twitching. Arm twitching resolved but right perioral twitching never completely resolved. EEG to evaluate for seizure Level of alertness: Awake, asleep AEDs during EEG study: LEV, LCM Technical aspects: This EEG study was done with scalp electrodes positioned according to the 10-20 International system of electrode placement. Electrical activity was reviewed with band pass filter of 1-70Hz , sensitivity of 7 uV/mm, display speed of 71mm/sec with a 60Hz  notched filter applied as appropriate. EEG data were recorded continuously and digitally stored.  Video monitoring was available and reviewed as appropriate. Description: The posterior dominant rhythm consists of 8 Hz activity of moderate voltage (25-35 uV) seen predominantly in posterior head regions, symmetric and reactive to eye opening and eye closing. Sleep was characterized by vertex waves, sleep spindles (12 to 14 Hz), maximal frontocentral region. EEG showed intermittent generalized polymorphic sharply contoured high amplitude 3 to 6 Hz theta-delta slowing. Hyperventilation and photic stimulation were not performed.   ABNORMALITY - Intermittent slow, generalized IMPRESSION: This study is suggestive of mild diffuse encephalopathy. No seizures or epileptiform discharges were seen throughout the recording. Please note that lack of epileptiform activity during interictal EEG does not exclude  the diagnosis of epilepsy.  Charlsie Quest   MR Brain W and Wo Contrast  Result Date: 08/02/2023 CLINICAL DATA:  Initial evaluation for neuro deficit, stroke suspected. EXAM: MRI HEAD WITHOUT AND WITH CONTRAST TECHNIQUE: Multiplanar, multiecho pulse sequences of the brain and surrounding structures were obtained without and with intravenous contrast. CONTRAST:  8mL GADAVIST GADOBUTROL 1 MMOL/ML IV SOLN COMPARISON:  None available. FINDINGS: Brain: Cerebral volume within normal limits. Few punctate foci of T2/FLAIR hyperintensity noted within the subcortical white matter of the anterior right frontal lobe, nonspecific, but minimal in nature, less than is typically seen for patient age. 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 No significant associated T2/FLAIR signal or enhancement. No other evidence for acute or subacute ischemia. No other acute or chronic intracranial blood products. No mass lesion, midline shift or mass effect. No hydrocephalus or extra-axial fluid collection. Empty sella noted. No abnormal enhancement or evidence for intracranial metastatic disease. Vascular: Major intracranial vascular flow voids are well maintained. Skull and upper cervical spine: Craniocervical junction within normal limits. Decreased T1 signal intensity seen throughout the visualized bone marrow, nonspecific, but most commonly related to anemia, smoking or obesity. No visible focal marrow replacing lesion. No scalp soft tissue abnormality. Sinuses/Orbits: Globes and orbital soft tissues within normal limits. Paranasal sinuses are clear. No mastoid effusion. Other: None. IMPRESSION: 1. 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 Differential considerations for this finding are broad, including  changes related to seizure, toxic metabolic derangement, demyelination, or less likely ischemia. 2. No other acute intracranial abnormality. No evidence for intracranial metastatic disease. 3. Empty sella. Electronically Signed   By: Rise Mu M.D.   On: 08/02/2023 20:10    Pending Labs Unresulted Labs (From admission, onward)     Start     Ordered   08/03/23 0623  Vitamin B1  Add-on,   AD        08/03/23 0622            Vitals/Pain Today's Vitals   08/03/23 1200 08/03/23 1300 08/03/23 1301 08/03/23 1346  BP: 120/86 109/70  99/68  Pulse: 95 89  93  Resp:  (!) 26  (!) 29  Temp:    98.6 F (37 C)  TempSrc:    Oral  SpO2: 99% 96%  98%  Weight:      Height:      PainSc: 0-No pain  0-No pain     Isolation Precautions No active isolations  Medications Medications  levETIRAcetam (KEPPRA) IVPB 1000 mg/100 mL premix (0 mg Intravenous Stopped 08/03/23 1041)  LORazepam (ATIVAN) injection 2 mg (2 mg Intravenous Given 08/02/23 2252)  lacosamide (VIMPAT) 200 mg in sodium chloride 0.9 % 25 mL IVPB (0 mg Intravenous Stopped 08/03/23 0148)    Followed by  lacosamide (VIMPAT) 100 mg in sodium chloride 0.9 % 25 mL IVPB (0 mg Intravenous Stopped 08/03/23 1022)  oxyCODONE (Oxy IR/ROXICODONE) immediate release tablet 5 mg (has no administration in time range)  acetaminophen (TYLENOL) tablet 1,000 mg (has no administration in time range)  ondansetron (ZOFRAN) injection 4 mg (has no administration in time range)  pantoprazole (PROTONIX) EC tablet 40 mg (0 mg Oral Hold 08/03/23 1000)  polyethylene glycol (MIRALAX / GLYCOLAX) packet 17 g (has no administration in time range)  insulin aspart (novoLOG) injection 0-6 Units ( Subcutaneous Not Given 08/03/23 1207)  gadobutrol (GADAVIST) 1 MMOL/ML injection 8 mL (8 mLs Intravenous  Contrast Given 08/02/23 1916)  levETIRAcetam (KEPPRA) IVPB 1500 mg/ 100 mL premix (0 mg Intravenous Stopped 08/02/23 2315)    And  levETIRAcetam (KEPPRA) IVPB  1500 mg/ 100 mL premix (0 mg Intravenous Stopped 08/02/23 2313)    And  levETIRAcetam (KEPPRA) IVPB 500 mg/100 mL premix (0 mg Intravenous Stopped 08/02/23 2319)  sodium chloride 0.9 % bolus 1,000 mL (0 mLs Intravenous Stopped 08/03/23 0611)  potassium chloride SA (KLOR-CON M) CR tablet 40 mEq (40 mEq Oral Given 08/03/23 0510)    Mobility walks     Focused Assessments    R Recommendations: See Admitting Provider Note  Report given to:   Additional Notes:

## 2023-08-03 NOTE — Progress Notes (Signed)
Pt is now avail to view remotely

## 2023-08-03 NOTE — Progress Notes (Signed)
Pt moved to 038c and back up and running, no atrium monitoring b/c pt is just in another room in the ED

## 2023-08-03 NOTE — Procedures (Signed)
Patient Name: Linda [name]  MRN: [id number]  Epilepsy Attending: Charlsie Quest  Referring Physician/Provider: Gordy Councilman, MD  Duration: 08/03/2023 2952 to 08/04/2023 8413  Patient history:  57 yo F with sudden onset of clamminess and sweatiness at 2 PM with right eye and right arm twitching. Arm twitching resolved but right perioral twitching never completely resolved. EEG to evaluate for seizure  Level of alertness: Awake, asleep  AEDs during EEG study: LEV, LCM  Technical aspects: This EEG study was done with scalp electrodes positioned according to the 10-20 International system of electrode placement. Electrical activity was reviewed with band pass filter of 1-70Hz , sensitivity of 7 uV/mm, display speed of 58mm/sec with a 60Hz  notched filter applied as appropriate. EEG data were recorded continuously and digitally stored.  Video monitoring was available and reviewed as appropriate.  Description: The posterior dominant rhythm consists of 8 Hz activity of moderate voltage (25-35 uV) seen predominantly in posterior head regions, symmetric and reactive to eye opening and eye closing. Sleep was characterized by vertex waves, sleep spindles (12 to 14 Hz), maximal frontocentral region. EEG showed intermittent generalized polymorphic sharply contoured high amplitude 3 to 6 Hz theta-delta slowing. Hyperventilation and photic stimulation were not performed.     ABNORMALITY - Intermittent slow, generalized  IMPRESSION: This study is suggestive of mild diffuse encephalopathy. No seizures or epileptiform discharges were seen throughout the recording.  Please note that lack of epileptiform activity during interictal EEG does not exclude the diagnosis of epilepsy.   Linda [name] [name]

## 2023-08-03 NOTE — Progress Notes (Signed)
Still can not pull study up remotely otw to trouble shoot and reboot again

## 2023-08-03 NOTE — Progress Notes (Signed)
No charge progress note.  Linda [name] is a 57 y.o. 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 coli treated with Augmentin, recently initiated on chemotherapy 10/10 with 5-FU and leucovorin.  Other notable recent history of dysfunctional uterine bleeding, GI bleeding, IDA, and chronic problems including diabetes, hypertension, obesity.  She was brought in from home after episode of right facial twitching.  Initial episode started 2 PM 10/12, on my interview reported that this first episode became generalized to the right arm and left arm but she maintained awareness.  In the emergency department she had continued facial twitching . Concern of focal motor seizure.  Neurology and oncology was consulted.  MRI with area of 12 mm focal diffusion restriction in the central splenium /corpus callosum without associated enhancement, per rads differential including seizure, toxic metabolic, demyelinating, less likely ischemic.  Per neurology felt potentially could be related to cytotoxic effect of 5-FU.  Although this is not a classic toxicity of fluorouracil, has been associated with seizures.   Seizure was managed with Keppra and lacosamide load, currently on Keppra 1 g IV every 12 hours, lacosamide 100 mg IV every 12 hours. LTM was also ordered and running. Per oncology if it is related to fluorouracil toxicity then she might need use of antidote uridine triacetate. Considering use if recurrent seizure.  Unfortunately [facility] pharmacy does not have that medicine.  Admitting provider did initiated with pharmacy for help [place] if we can have that medicine.  Neurology also recommended LP including basic studies, flow cytometry, cytopath.  IR fluoroscopy guided LP was ordered but unfortunately they want a bedside LP attempt before proceeding.  Message again sent to  neurology.  Patient now appears to be at baseline.  No more twitching noted.  Passed swallow evaluation so started on diet. Getting EEG.  Patient has benign exam.  Case was discussed with daughter at bedside.  Discontinuing IV fluid

## 2023-08-04 ENCOUNTER — Other Ambulatory Visit: Payer: Self-pay

## 2023-08-04 ENCOUNTER — Other Ambulatory Visit (HOSPITAL_COMMUNITY): Payer: Self-pay

## 2023-08-04 ENCOUNTER — Telehealth: Payer: Self-pay | Admitting: Hematology

## 2023-08-04 DIAGNOSIS — R569 Unspecified convulsions: Secondary | ICD-10-CM | POA: Diagnosis not present

## 2023-08-04 LAB — CBG MONITORING, ED: Glucose-Capillary: 99 mg/dL (ref 70–99)

## 2023-08-04 MED ORDER — LACOSAMIDE 50 MG PO TABS
100.0000 mg | ORAL_TABLET | Freq: Two times a day (BID) | ORAL | Status: DC
  Administered 2023-08-04: 100 mg via ORAL
  Filled 2023-08-04: qty 2

## 2023-08-04 MED ORDER — LACOSAMIDE 100 MG PO TABS
100.0000 mg | ORAL_TABLET | Freq: Two times a day (BID) | ORAL | 0 refills | Status: DC
  Filled 2023-08-04: qty 120, 60d supply, fill #0

## 2023-08-04 MED ORDER — LEVETIRACETAM 1000 MG PO TABS
1000.0000 mg | ORAL_TABLET | Freq: Two times a day (BID) | ORAL | 0 refills | Status: DC
  Filled 2023-08-04: qty 60, 30d supply, fill #0
  Filled 2023-09-01: qty 60, 30d supply, fill #1

## 2023-08-04 MED ORDER — LEVETIRACETAM 500 MG PO TABS
1000.0000 mg | ORAL_TABLET | Freq: Two times a day (BID) | ORAL | Status: DC
  Administered 2023-08-04: 1000 mg via ORAL
  Filled 2023-08-04: qty 2

## 2023-08-04 NOTE — Progress Notes (Signed)
LTM EEG discontinued - no skin breakdown at unhook.  

## 2023-08-04 NOTE — Procedures (Addendum)
Patient Name: ERIC MORGANTI  MRN: [id number]  Epilepsy Attending: Charlsie Quest  Referring Physician/Provider: Gordy Councilman, MD  Duration: 08/04/2023 7829 to 08/04/2023 0912   Patient history:  57 yo F with sudden onset of clamminess and sweatiness at 2 PM with right eye and right arm twitching. Arm twitching resolved but right perioral twitching never completely resolved. EEG to evaluate for seizure   Level of alertness: Awake, asleep   AEDs during EEG study: LEV, LCM   Technical aspects: This EEG study was done with scalp electrodes positioned according to the 10-20 International system of electrode placement. Electrical activity was reviewed with band pass filter of 1-70Hz , sensitivity of 7 uV/mm, display speed of 7mm/sec with a 60Hz  notched filter applied as appropriate. EEG data were recorded continuously and digitally stored.  Video monitoring was available and reviewed as appropriate.   Description: The posterior dominant rhythm consists of 8 Hz activity of moderate voltage (25-35 uV) seen predominantly in posterior head regions, symmetric and reactive to eye opening and eye closing. Sleep was characterized by vertex waves, sleep spindles (12 to 14 Hz), maximal frontocentral region. EEG showed intermittent generalized polymorphic sharply contoured high amplitude 3 to 6 Hz theta-delta slowing. Hyperventilation and photic stimulation were not performed.      ABNORMALITY - Intermittent slow, generalized   IMPRESSION: This study is suggestive of mild diffuse encephalopathy. No seizures or epileptiform discharges were seen throughout the recording.   Please note that lack of epileptiform activity during interictal EEG does not exclude the diagnosis of epilepsy.   Haili Donofrio [name]

## 2023-08-04 NOTE — Assessment & Plan Note (Signed)
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  [name] in the liver MRI also showed a 1.8 cm oligo met in segment 7 of liver. -We discussed the incurable nature of her metastatic disease, due to her metastasis in peritoneum and liver.  We also discussed the small possibility of HIPEC and liver directed therapy if she has excellent response to chemotherapy. -I recommend first-line chemotherapy FOLFOX.  She is not a candidate for first-line immunotherapy given MMR normal.  I will order NexGen or sequencing Tempus to see if she is a candidate for targeted therapy.  Given the primary tumor in terminal ileum, there is no benefit of VEGFR inhibitor even if her tumor is wild type of KRAS/NRAS/BRAF. Benefit of bevacizumab is also uncertain given the primary tumor in small intestine.

## 2023-08-04 NOTE — Progress Notes (Signed)
Neurology Progress Note   S:// Patient seen and examined.  Currently hooked up to LTM EEG.  Reports no twitching overnight   O:// Current vital signs: BP 97/64 (BP Location: Right Arm)   Pulse 82   Temp 97.8 F (36.6 C) (Oral)   Resp 20   Ht 5\' 4"  (1.626 m)   Wt 83.9 kg   SpO2 98%   BMI 31.75 kg/m  Vital signs in last 24 hours: Temp:  [97.8 F (36.6 C)-98.9 F (37.2 C)] 97.8 F (36.6 C) (10/14 0647) Pulse Rate:  [66-98] 82 (10/14 0647) Resp:  [18-29] 20 (10/14 0647) BP: (97-120)/(61-86) 97/64 (10/14 0647) SpO2:  [92 %-99 %] 98 % (10/14 0647) General: Awake alert in no distress HEENT: Normocephalic atraumatic Lungs: Clear Cardiovascular: Regular rhythm Abdomen nondistended nontender Neurological exam Awake alert oriented x 3 No evidence of dysarthria.  No evidence of effusion Cranial nerves II to XII intact Motor examination with no drift in any of the 4 extremities Sensation intact to light touch without extinction Coordination examination reveals no dysmetria.  Medications  Current Facility-Administered Medications:    acetaminophen (TYLENOL) tablet 1,000 mg, 1,000 mg, Oral, Q6H PRN, Dolly Rias, MD, 1,000 mg at 08/03/23 1557   insulin aspart (novoLOG) injection 0-6 Units, 0-6 Units, Subcutaneous, TID WC, Segars, Christiane Ha, MD   [COMPLETED] lacosamide (VIMPAT) 200 mg in sodium chloride 0.9 % 25 mL IVPB, 200 mg, Intravenous, Once, Stopped at 08/03/23 0148 **FOLLOWED BY** lacosamide (VIMPAT) 100 mg in sodium chloride 0.9 % 25 mL IVPB, 100 mg, Intravenous, Q12H, Bhagat, Srishti L, MD, Stopped at 08/03/23 2208   levETIRAcetam (KEPPRA) IVPB 1000 mg/100 mL premix, 1,000 mg, Intravenous, Q12H, Gerhard Munch, MD, Stopped at 08/03/23 2125   LORazepam (ATIVAN) injection 2 mg, 2 mg, Intravenous, Q4H PRN, Bhagat, Srishti L, MD, 2 mg at 08/02/23 2252   ondansetron (ZOFRAN) injection 4 mg, 4 mg, Intravenous, Q6H PRN, Dolly Rias, MD   oxyCODONE (Oxy IR/ROXICODONE)  immediate release tablet 5 mg, 5 mg, Oral, Q6H PRN, Segars, Christiane Ha, MD   pantoprazole (PROTONIX) EC tablet 40 mg, 40 mg, Oral, BID, Segars, Christiane Ha, MD, 40 mg at 08/03/23 2111   polyethylene glycol (MIRALAX / GLYCOLAX) packet 17 g, 17 g, Oral, Daily PRN, Dolly Rias, MD  Current Outpatient Medications:    acetaminophen (TYLENOL) 500 MG tablet, Take 2 tablets (1,000 mg total) by mouth every 8 (eight) hours as needed for mild pain or fever., Disp: , Rfl:    levETIRAcetam (KEPPRA) 500 MG tablet, Take 1 tablet (500 mg total) by mouth 2 (two) times daily., Disp: 60 tablet, Rfl: 0   lidocaine-prilocaine (EMLA) cream, Apply to affected area once, Disp: 30 g, Rfl: 3   methocarbamol (ROBAXIN) 500 MG tablet, Take 2 tablets (1,000 mg total) by mouth every 8 (eight) hours as needed for muscle spasms., Disp: 30 tablet, Rfl: 0   ondansetron (ZOFRAN) 8 MG tablet, Take 1 tablet (8 mg total) by mouth every 8 (eight) hours as needed for nausea or vomiting. Start on the third day after chemotherapy., Disp: 30 tablet, Rfl: 1   oxyCODONE (OXY IR/ROXICODONE) 5 MG immediate release tablet, Take 1-2 tablets (5-10 mg total) by mouth every 6 (six) hours as needed for moderate pain or severe pain (5mg  moderate, 10mg  severe). (Patient taking differently: Take 5 mg by mouth every 6 (six) hours as needed for moderate pain or severe pain.), Disp: 60 tablet, Rfl: 0   pantoprazole (PROTONIX) 40 MG tablet, Take 1 tablet (40 mg total) by  mouth 2 (two) times daily., Disp: 60 tablet, Rfl: 1   polyethylene glycol powder (GLYCOLAX/MIRALAX) 17 GM/SCOOP powder, Take 17 g by mouth daily mixed in liquid as directed. (Patient taking differently: Take 17 g by mouth daily as needed for mild constipation.), Disp: 238 g, Rfl: 0   prochlorperazine (COMPAZINE) 10 MG tablet, Take 1 tablet (10 mg total) by mouth every 6 (six) hours as needed for nausea or vomiting., Disp: 30 tablet, Rfl: 1   simethicone (MYLICON) 80 MG chewable tablet, Chew 1  tablet (80 mg total) by mouth 4 (four) times daily as needed for flatulence., Disp: 100 tablet, Rfl: 0  Labs CBC    Component Value Date/Time   WBC 4.6 08/03/2023 0614   RBC 4.13 08/03/2023 0614   HGB 10.9 (L) 08/03/2023 0614   HGB 10.8 (L) 07/31/2023 0903   HGB 10.3 (L) 05/16/2022 1044   HCT 36.4 08/03/2023 0614   HCT 33.9 (L) 05/16/2022 1044   PLT 220 08/03/2023 0614   PLT 298 07/31/2023 0903   PLT 364 05/16/2022 1044   MCV 88.1 08/03/2023 0614   MCV 80 05/16/2022 1044   MCH 26.4 08/03/2023 0614   MCHC 29.9 (L) 08/03/2023 0614   RDW 23.9 (H) 08/03/2023 0614   RDW 19.4 (H) 05/16/2022 1044   LYMPHSABS 1.3 07/31/2023 0903   MONOABS 0.6 07/31/2023 0903   EOSABS 0.2 07/31/2023 0903   BASOSABS 0.1 07/31/2023 0903    CMP     Component Value Date/Time   NA 137 08/03/2023 0614   NA 141 05/16/2022 1044   K 3.7 08/03/2023 0614   CL 102 08/03/2023 0614   CO2 21 (L) 08/03/2023 0614   GLUCOSE 101 (H) 08/03/2023 0614   BUN 5 (L) 08/03/2023 0614   BUN 9 05/16/2022 1044   CREATININE 0.80 08/03/2023 0614   CREATININE 0.66 07/31/2023 0903   CALCIUM 8.9 08/03/2023 0614   PROT 7.2 08/03/2023 0620   PROT 7.8 05/16/2022 1044   ALBUMIN 2.8 (L) 08/03/2023 0620   ALBUMIN 4.4 05/16/2022 1044   AST 23 08/03/2023 0620   AST 13 (L) 07/31/2023 0903   ALT 15 08/03/2023 0620   ALT 12 07/31/2023 0903   ALKPHOS 81 08/03/2023 0620   BILITOT 0.7 08/03/2023 0620   BILITOT 0.6 07/31/2023 0903   GFRNONAA >60 08/03/2023 0614   GFRNONAA >60 07/31/2023 0903    Lipid Panel     Component Value Date/Time   CHOL 234 (H) 05/16/2022 1044   TRIG 270 (H) 07/14/2023 0323   HDL 48 05/16/2022 1044   CHOLHDL 4.9 (H) 05/16/2022 1044   LDLCALC 164 (H) 05/16/2022 1044    Lab Results  Component Value Date   HGBA1C 5.5 07/09/2023    CSF with 1 WBC, protein 18, glucose 61. 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  No other acute finding.  Overnight LTM EEG with intermittent generalized slowing.  No seizures seen.  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 Currently on Keppra and Vimpat. Spinal tap was performed.  Preliminary review unremarkable-normal protein, normal glucose and  only 1 WBC make inflammatory or malignancies.  Less likely but cytology as well as IgG index etc. are pending.  Autoimmune encephalitis panel is also pending but less likely to be positive given the above preliminary findings.  Meningitis/encephalitis panel negative.  Impression: New onset focal seizures  Recommendations: Continue Keppra and Vimpat for now Discontinue LTM EEG Maintain seizure precautions - detailed below including no driving unless 6 mo seizure free. Follow-up on the remainder of the CSF studies-can be done as an outpatient as there is no emergent need for treatment of infection and the preliminary CSF results are very less likely to yield a positive autoimmune encephalitis or other workup positive. Close follow-up with neurology in 1 to 2 weeks after discharge and follow-up with oncology as recommended. Plan was relayed to Dr. Renford Dills over secure chat.  -- Milon Dikes, MD Neurologist [facility] Pager: [phone]   SEIZURE PRECAUTIONS Per [facility] statutes, patients with seizures are not allowed to drive until they have been seizure-free for six months.   Use caution when using heavy equipment or power tools. Avoid working on ladders or at heights. Take  showers instead of baths. Ensure the water temperature is not too high on the home water heater. Do not go swimming alone. Do not lock yourself in a room alone (i.e. bathroom). When caring for infants or small children, sit down when holding, feeding, or changing them to minimize risk of injury to the child in the event you have a seizure. Maintain good sleep hygiene. Avoid alcohol.    If patient has another seizure, call 911 and bring them back to the ED if: A.  The seizure lasts longer than 5 minutes.      B.  The patient doesn't wake shortly after the seizure or has new problems such as difficulty seeing, speaking or moving following the seizure C.  The patient was injured during the seizure D.  The patient has a temperature over 102 F (39C) E.  The patient vomited during the seizure and now is having trouble breathing

## 2023-08-04 NOTE — Discharge Summary (Signed)
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 Neurology consulted. LTM EEG initiated. Suspected new onset focal seizures. Underwent lumbar puncture, fluid analysis not assistive of encephalitis or meningitis.  Neurology recommended to continue Keppra and Vimpat and cleared for discharge with outpatient follow-up with recommendation.  Patient is medically stable for discharge today.  Following problems were addressed during the hospitalization:  New onset focal seizures: Presented with right facial twitching which has resolved.  Initially thought to be from cytotoxic effect of 5-fluorouracil.  LTM EEG discontinued.  EEG showed diffuse encephalopathy, no seizures or epileptiform discharges.  Neurology recommended to continue Keppra and Vimpat and outpatient follow-up in 1 to 2 weeks. Underwent a spinal tap.  Shows normal protein, normal glucose, only 1 WBC.  Very low suspicion for infectious process in the CNS.  cytology, IgG index, autoimmune encephalitis panel can be followed as an outpatient. 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  Currently she is alert and oriented.   Stage IV small bowel  adenocarcinoma: Mets to liver, peritoneum.  Currently on chemotherapy.  Oncology was following here.  We recommend outpatient follow-up with oncology with Dr. Mosetta Putt.   Recent history of intra-abdominal abscess with E. coli: Completed antibiotics course.  No suspicion for current infectious process   Diabetes type 2: Continue home regimen     Discharge Diagnoses:  Principal Problem:   Seizure-like activity (HCC) Active Problems:   Peritoneal carcinomatosis (HCC)   Chemotherapy adverse reaction   AKI (acute kidney injury) (HCC)   Brain lesion    Discharge Instructions  Discharge Instructions     Diet general   Complete by: As directed    Discharge instructions   Complete by: As directed    1)Please take prescribed medications as instructed 2)Follow up with your PCP in a week 3)Follow up with neurology as an outpatient in  2 weeks.  Name and number of the provider group has been attached   Increase activity slowly   Complete by: As directed       Allergies as of 08/04/2023   No Known Allergies      Medication List     TAKE these medications    acetaminophen 500 MG tablet Commonly known as: TYLENOL Take 2 tablets (1,000 mg total) by mouth every 8 (eight) hours as needed for mild pain or fever.   Lacosamide 100 MG Tabs Take 1 tablet (100 mg total) by mouth 2 (two) times daily.   levETIRAcetam 1000 MG tablet Commonly known as: KEPPRA Take 1 tablet (1,000 mg total) by mouth 2 (two) times daily.   lidocaine-prilocaine cream Commonly known as: EMLA Apply to affected area once   methocarbamol 500 MG tablet Commonly known as: ROBAXIN Take 2 tablets (1,000 mg total) by mouth every 8 (eight) hours as needed for muscle spasms.   ondansetron 8  MG tablet Commonly known as: Zofran Take 1 tablet (8 mg total) by mouth every 8 (eight) hours as needed for nausea or vomiting. Start on the third day after chemotherapy.   oxyCODONE 5 MG immediate release tablet Commonly  known as: Oxy IR/ROXICODONE Take 1-2 tablets (5-10 mg total) by mouth every 6 (six) hours as needed for moderate pain or severe pain (5mg  moderate, 10mg  severe). What changed:  how much to take reasons to take this   pantoprazole 40 MG tablet Commonly known as: PROTONIX Take 1 tablet (40 mg total) by mouth 2 (two) times daily.   polyethylene glycol powder 17 GM/SCOOP powder Commonly known as: GLYCOLAX/MIRALAX Take 17 g by mouth daily mixed in liquid as directed. What changed:  when to take this reasons to take this   prochlorperazine 10 MG tablet Commonly known as: COMPAZINE Take 1 tablet (10 mg total) by mouth every 6 (six) hours as needed for nausea or vomiting.   simethicone 80 MG chewable tablet Commonly known as: MYLICON Chew 1 tablet (80 mg total) by mouth 4 (four) times daily as needed for flatulence.        Follow-up Information     Arnette Felts, [name]. Schedule an appointment as soon as possible for a visit in 1 week(s).   Specialty: General Practice Contact information: [address] [place] [address] [phone]         [facility]. Schedule an appointment as soon as possible for a visit in 2 week(s).   Specialty: Neurology Contact information: [address] [place] [address] [phone]               No Known Allergies  Consultations: Neurology, oncology   Procedures/Studies: Overnight EEG with video  Result Date: 08/03/2023 Charlsie Quest, MD     08/04/2023  8:56 AM Patient Name: Linda [name] MRN: [id number] Epilepsy Attending: Charlsie Quest Referring Physician/Provider: Gordy Councilman, MD Duration: 08/03/2023 3710 to 08/04/2023 6269 Patient history:  57 yo F with sudden onset of clamminess and sweatiness at 2 PM with right eye and right arm twitching. Arm twitching resolved but right perioral twitching never completely resolved. EEG to evaluate for  seizure Level of alertness: Awake, asleep AEDs during EEG study: LEV, LCM Technical aspects: This EEG study was done with scalp electrodes positioned according to the 10-20 International system of electrode placement. Electrical activity was reviewed with band pass filter of 1-70Hz , sensitivity of 7 uV/mm, display speed of 53mm/sec with a 60Hz  notched filter applied as appropriate. EEG data were recorded continuously and digitally stored.  Video monitoring was available and reviewed as appropriate. Description: The posterior dominant rhythm consists of 8 Hz activity of moderate voltage (25-35 uV) seen predominantly in posterior head regions, symmetric and reactive to eye opening and eye closing. Sleep was characterized by vertex waves, sleep spindles (12 to 14 Hz), maximal frontocentral region. EEG showed intermittent generalized polymorphic sharply contoured high amplitude 3 to 6 Hz theta-delta slowing. Hyperventilation and photic stimulation were not performed.   ABNORMALITY - Intermittent slow, generalized IMPRESSION: This study is suggestive of mild diffuse encephalopathy. No seizures or epileptiform discharges were seen throughout the recording. Please note that lack of epileptiform activity during interictal EEG does not exclude the diagnosis of epilepsy.  Charlsie Quest   MR Brain W and Wo Contrast  Result Date: 08/02/2023 CLINICAL DATA:  Initial evaluation for neuro deficit, stroke suspected. EXAM: MRI HEAD WITHOUT AND  WITH CONTRAST TECHNIQUE: Multiplanar, multiecho pulse sequences of the brain and surrounding structures were obtained without and with intravenous contrast. CONTRAST:  8mL GADAVIST GADOBUTROL 1 MMOL/ML IV SOLN COMPARISON:  None available. FINDINGS: Brain: Cerebral volume within normal limits. Few punctate foci of T2/FLAIR hyperintensity noted within the subcortical white matter of the anterior right frontal lobe, nonspecific, but minimal in nature, less than is typically seen for  patient age. 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 No significant associated T2/FLAIR signal or enhancement. No other evidence for acute or subacute ischemia. No other acute or chronic intracranial blood products. No mass lesion, midline shift or mass effect. No hydrocephalus or extra-axial fluid collection. Empty sella noted. No abnormal enhancement or evidence for intracranial metastatic disease. Vascular: Major intracranial vascular flow voids are well maintained. Skull and upper cervical spine: Craniocervical junction within normal limits. Decreased T1 signal intensity seen throughout the visualized bone marrow, nonspecific, but most commonly related to anemia, smoking or obesity. No visible focal marrow replacing lesion. No scalp soft tissue abnormality. Sinuses/Orbits: Globes and orbital soft tissues within normal limits. Paranasal sinuses are clear. No mastoid effusion. Other: None. IMPRESSION: 1. 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 Differential considerations for this finding are broad, including changes related to seizure, toxic metabolic derangement, demyelination, or less likely ischemia. 2. No other acute intracranial abnormality. No evidence for intracranial metastatic disease. 3. Empty sella. Electronically Signed   By: Rise Mu M.D.   On: 08/02/2023 20:10   CT PELVIS W CONTRAST  Result Date: 07/16/2023 CLINICAL DATA:  57 year old woman with history of right lower quadrant postsurgical abscess underwent CT-guided drain placement on 07/11/2023. EXAM: CT PELVIS WITH CONTRAST TECHNIQUE: Multidetector CT imaging of the pelvis was performed using the standard protocol following the bolus administration of intravenous contrast. RADIATION DOSE REDUCTION:  This exam was performed according to the departmental dose-optimization program which includes automated exposure control, adjustment of the mA and/or kV according to patient size and/or use of iterative reconstruction technique. CONTRAST:  OMNIPAQUE IOHEXOL 300 MG/ML  SOLN COMPARISON:  07/09/2023 FINDINGS: Urinary Tract:  No abnormality visualized. Bowel: Right lower quadrant drain is present. No significant residual abscess cavity is seen. Mixed solid cystic midline pelvic mass is again seen consistent with metastatic disease. This mass is either invading or originating from the distal sigmoid colon. Vascular/Lymphatic: No significant abnormality of the vasculature. Reproductive: Small amount of endometrial fluid is noted. Hypoechoic region along the posterior lower uterine segment best seen on image 75 of series 6 measuring 1.9 x 1.2 cm is suspicious for direct invasion from pelvic mass. Other: Mild anterior abdominal subcutaneous emphysema consistent with recent postop status. Musculoskeletal: No acute osseous abnormality. 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 IMPRESSION: 1. Right lower quadrant drain is in appropriate position. No significant residual abscess cavity is seen. 2. Unchanged malignant midline pelvic mass. Electronically Signed   By: Acquanetta Belling M.D.   On: 07/16/2023 17:03   DG Sinus/Fist Tube Chk-Non GI  Result Date: 07/16/2023 INDICATION: 57 year old woman developed postop public abscess and underwent CT-guided drain placement on 07/11/2023. She has had less than 10 mL of output and CT shows resolution of abscess. EXAM: Fluoroscopic abscessogram MEDICATIONS: The patient is currently admitted to the hospital and receiving intravenous antibiotics. The antibiotics  were administered within an appropriate time frame prior to the initiation of the procedure. ANESTHESIA/SEDATION: None COMPLICATIONS: None immediate. PROCEDURE: Informed written consent was obtained from the patient  after a thorough discussion of the procedural risks, benefits and alternatives. All questions were addressed. A timeout was performed prior to the initiation of the procedure. Scout image shows the right pelvic drain in appropriate position. Contrast administered through the drain showed minimal abscess cavity with no fistulous communication. The drain was removed without difficulty. The site was covered with sterile dressing. IMPRESSION: Pelvic abscessogram shows no residual abscess. No fistulous communication was seen. The drain was removed. Electronically Signed   By: Acquanetta Belling M.D.   On: 07/16/2023 15:18   CT GUIDED PERITONEAL/RETROPERITONEAL FLUID DRAIN BY PERC CATH  Result Date: 07/11/2023 INDICATION: 57 year old female with history of right lower quadrant postsurgical abdominal fluid collection concerning for abscess. EXAM: CT PERC DRAIN PERITONEAL ABCESS COMPARISON:  None Available. MEDICATIONS: The patient is currently admitted to the hospital and receiving intravenous antibiotics. The antibiotics were administered within an appropriate time frame prior to the initiation of the procedure. ANESTHESIA/SEDATION: Moderate (conscious) sedation was employed during this procedure. A total of Versed 2 mg and Fentanyl 100 mcg was administered intravenously. Moderate Sedation Time: 19 minutes. The patient's level of consciousness and vital signs were monitored continuously by radiology nursing throughout the procedure under my direct supervision. CONTRAST:  None COMPLICATIONS: None immediate. PROCEDURE: RADIATION DOSE REDUCTION: This exam was performed according to the departmental dose-optimization program which includes automated exposure control, adjustment of the mA and/or kV according to patient size and/or use of iterative reconstruction technique. Informed written consent was obtained from the patient after a discussion of the risks, benefits and alternatives to treatment. The patient was placed supine  on the CT gantry and a pre procedural CT was performed re-demonstrating the known abscess/fluid collection within the right lower quadrant. The procedure was planned. A timeout was performed prior to the initiation of the procedure. The right lower quadrant was prepped and draped in the usual sterile fashion. The overlying soft tissues were anesthetized with 1% lidocaine with epinephrine. Appropriate trajectory was planned with the use of a 22 gauge spinal needle. An 18 gauge trocar needle was advanced into the abscess/fluid collection and a short Amplatz super stiff wire was coiled within the collection. Appropriate positioning was confirmed with a limited CT scan. The tract was serially dilated allowing placement of a 10 [place] all-purpose drainage catheter. Appropriate positioning was confirmed with a limited postprocedural CT scan. Approximately 50 ml of purulent fluid was aspirated. The tube was connected to a bulb suction and sutured in place. A dressing was placed. The patient tolerated the procedure well without immediate post procedural complication. IMPRESSION: Successful CT guided placement of a 10 French all purpose drain catheter into the right lower quadrant fluid collection with aspiration of approximately 50 mL of purulent fluid. Samples were sent to the laboratory as requested by the ordering clinical team. Marliss Coots, MD [facility] Electronically Signed   By: Marliss Coots M.D.   On: 07/11/2023 15:21   CT ABDOMEN PELVIS W CONTRAST  Result Date: 07/09/2023 CLINICAL DATA:  Abdominal pain, status post exploratory laparotomy with small bowel resection and omental biopsy EXAM: CT ABDOMEN AND PELVIS WITH CONTRAST TECHNIQUE: Multidetector CT imaging of the abdomen and pelvis was performed using the standard protocol following bolus administration of intravenous contrast. RADIATION DOSE REDUCTION: This exam was performed according to the  departmental dose-optimization program which includes automated exposure control, adjustment of the mA and/or kV according to patient size and/or use of iterative reconstruction technique. CONTRAST:  OMNIPAQUE IOHEXOL 300 MG/ML  SOLN COMPARISON:  MRI abdomen dated 07/03/2023. CT abdomen/pelvis dated 06/22/2023. FINDINGS: Lower chest: Mild bibasilar atelectasis. Hepatobiliary: Subcentimeter lesion in the posterior right hepatic lobe (series 10/image 22), suspicious for metastasis on MR. Layering tiny gallstones (series 2/image 34), without associated linear changes. No intrahepatic or extrahepatic duct dilatation. Pancreas: Within normal limits. Spleen: Within normal limits Adrenals/Urinary Tract: Adrenal glands within normal limits. Kidneys are within normal limits.  No hydronephrosis. Bladder is within normal limits with trace nondependent gas. Stomach/Bowel: Stomach is within normal limits. No evidence of bowel obstruction, improved. Status post small bowel resection with suture line in the right lateral abdomen (series 2/image 60). Adjacent long segment wall thickening involving small bowel in the right lower abdomen (series 2/image 85), new, raising concern for at risk bowel. Adjacent wall thickening/inflammatory changes involving the cecum (series 2, image 54), new. No pneumatosis or free air. Adjacent fluid collection in the right lower quadrant measuring 3.5 x 3.1 x 5.5 cm (series 2/image 64), possibly reflecting a postsurgical abscess, noting that the appendix is not visualized. Mild sigmoid diverticulosis, without diverticulitis. Vascular/Lymphatic: No evidence of abdominal aortic aneurysm. Small retroperitoneal lymph nodes which do not meet pathologic CT size criteria. Reproductive: Uterus is grossly unremarkable The ovaries are poorly visualized. Other: 5.4 x 7.8 cm complex cystic peritoneal implant in the dependent pelvis (series 2/image 94). Scattered peritoneal nodularity measuring up to 18 mm  medial left in the anterior abdominal wall (series 2/image 54). Some of the recent peritoneal implants on CT are no longer evident, likely surgically excised. Musculoskeletal: Visualized osseous structures are within normal limits. IMPRESSION: Status post small bowel resection with suture line in the right lateral abdomen. Adjacent long segment wall thickening involving the distal small bowel and cecal wall thickening, new, raising concern for at risk bowel. No pneumatosis or free air. Consider surgical evaluation as clinically warranted. 5.5 cm right lower quadrant fluid collection, possibly reflecting a postsurgical abscess, noting that the appendix is not visualized. 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 Additional ancillary findings as above. Electronically Signed   By: Charline Bills M.D.   On: 07/09/2023 18:40   IR IMAGING GUIDED PORT INSERTION  Result Date: 07/08/2023 INDICATION: 57 year old female with history of colon cancer presenting for Port-A-Cath placement. EXAM: IMPLANTED PORT A CATH PLACEMENT WITH ULTRASOUND AND FLUOROSCOPIC GUIDANCE COMPARISON:  None Available. MEDICATIONS: None. ANESTHESIA/SEDATION: Moderate (conscious) sedation was employed during this procedure. A total of Versed 2 mg and Fentanyl 100 mcg was administered intravenously. Moderate Sedation Time: 15 minutes. The patient's level of consciousness and vital signs were monitored continuously by radiology nursing throughout the procedure under my direct supervision. CONTRAST:  None FLUOROSCOPY TIME:  Five mGy COMPLICATIONS: None immediate. PROCEDURE: The procedure, risks, benefits, and alternatives were explained to the patient. Questions regarding the procedure were encouraged and answered. The patient understands and consents to the procedure. The right neck and chest were prepped with  chlorhexidine in a sterile fashion, and a sterile drape was applied covering the operative field. Maximum barrier sterile technique with sterile gowns and gloves were used for the procedure. A timeout was performed prior to the initiation of the procedure. Ultrasound was used to examine the jugular vein which was compressible  and free of internal echoes. A skin marker was used to demarcate the planned venotomy and port pocket incision sites. Local anesthesia was provided to these sites and the subcutaneous tunnel track with 1% lidocaine with 1:100,000 epinephrine. A small incision was created at the jugular access site and blunt dissection was performed of the subcutaneous tissues. Under ultrasound guidance, the jugular vein was accessed with a 21 ga micropuncture needle and an 0.018" wire was inserted to the superior vena cava. Real-time ultrasound guidance was utilized for vascular access including the acquisition of a permanent ultrasound image documenting patency of the accessed vessel. A 5 Fr micopuncture set was then used, through which a 0.035" Rosen wire was passed under fluoroscopic guidance into the inferior vena cava. An 8 Fr dilator was then placed over the wire. A subcutaneous port pocket was then created along the upper chest wall utilizing a combination of sharp and blunt dissection. The pocket was irrigated with sterile saline, packed with gauze, and observed for hemorrhage. A single lumen plastic power injectable port was chosen for placement. The 8 Fr catheter was tunneled from the port pocket site to the venotomy incision. The port was placed in the pocket. The external catheter was trimmed to appropriate length. The dilator was exchanged for an 8 Fr peel-away sheath under fluoroscopic guidance. The catheter was then placed through the sheath and the sheath was removed. Final catheter positioning was confirmed and documented with a fluoroscopic spot radiograph. The port was accessed with a Huber  needle, aspirated, and flushed with heparinized saline. The deep dermal layer of the port pocket incision was closed with interrupted 3-0 Vicryl suture. The skin was opposed with a running subcuticular 4-0 Monocryl suture. Dermabond was then placed over the port pocket and neck incisions. The patient tolerated the procedure well without immediate post procedural complication. FINDINGS: After catheter placement, the tip lies within the superior cavoatrial junction. The catheter aspirates and flushes normally and is ready for immediate use. IMPRESSION: Successful placement of a power injectable Port-A-Cath via the right internal jugular vein. The catheter is ready for immediate use. Marliss Coots, MD [facility] Radiology Electronically Signed   By: Marliss Coots M.D.   On: 07/08/2023 15:15      Subjective: Patient seen and examined at bedside today.  Hemodynamically stable.  Not in any kind of distress.  Completely alert and oriented.  She does not have any ambulatory problem.  She is eager to go home.  We discussed about discharge planning and follow-up with neurology as an outpatient.  Discharge Exam: Vitals:   08/04/23 0500 08/04/23 0647  BP: 111/81 97/64  Pulse: 72 82  Resp: 18 20  Temp:  97.8 F (36.6 C)  SpO2: 98% 98%   Vitals:   08/04/23 0130 08/04/23 0430 08/04/23 0500 08/04/23 0647  BP: 103/61 106/72 111/81 97/64  Pulse: 66 73 72 82  Resp: (!) 21 (!) 21 18 20   Temp: 98.6 F (37 C)   97.8 F (36.6 C)  TempSrc:    Oral  SpO2: 99% 99% 98% 98%  Weight:      Height:        General: Pt is alert, awake, not in acute distress Cardiovascular: RRR, S1/S2 +, no rubs, no gallops Respiratory: CTA bilaterally, no wheezing, no rhonchi Abdominal: Soft, NT, ND, bowel sounds + Extremities: no edema, no cyanosis    The results of significant diagnostics from this hospitalization (including imaging, microbiology, ancillary and laboratory) are  listed below for reference.     Microbiology: Recent Results (from the past 240 hour(s))  CSF culture w Gram Stain     Status: None (Preliminary result)   Collection Time: 08/03/23  3:58 PM   Specimen: CSF; Cerebrospinal Fluid  Result Value Ref Range Status   Specimen Description CSF  Final   Special Requests NONE  Final   Gram Stain   Final    NO WBC SEEN NO ORGANISMS SEEN CYTOSPIN SMEAR Performed at [facility], [address] [place], [place] [address]    Culture PENDING  Incomplete   Report Status PENDING  Incomplete     Labs: BNP (last 3 results) No results for input(s): "BNP" in the last 8760 hours. Basic Metabolic Panel: Recent Labs  Lab 07/31/23 0903 08/02/23 1624 08/03/23 0614  NA 138 138 137  K 3.5 3.4* 3.7  CL 102 98 102  CO2 28 24 21*  GLUCOSE 99 131* 101*  BUN <5* <5* 5*  CREATININE 0.66 1.11* 0.80  CALCIUM 9.2 9.8 8.9  MG  --   --  1.9  PHOS  --   --  4.9*   Liver Function Tests: Recent Labs  Lab 07/31/23 0903 08/03/23 0620  AST 13* 23  ALT 12 15  ALKPHOS 107 81  BILITOT 0.6 0.7  PROT 8.0 7.2  ALBUMIN 3.4* 2.8*   No results for input(s): "LIPASE", "AMYLASE" in the last 168 hours. Recent Labs  Lab 08/03/23 0614  AMMONIA 49*   CBC: Recent Labs  Lab 07/31/23 0903 08/02/23 1624 08/03/23 0614  WBC 6.8 5.7 4.6  NEUTROABS 4.7  --   --   HGB 10.8* 12.1 10.9*  HCT 34.9* 39.0 36.4  MCV 84.5 84.6 88.1  PLT 298 268 220   Cardiac Enzymes: No results for input(s): "CKTOTAL", "CKMB", "CKMBINDEX", "TROPONINI" in the last 168 hours. BNP: Invalid input(s): "POCBNP" CBG: Recent Labs  Lab 08/02/23 1513 08/03/23 1139 08/03/23 2235 08/04/23 0813  GLUCAP 130* 92 97 99   D-Dimer No results for input(s): "DDIMER" in the last 72 hours. Hgb A1c No results for input(s): "HGBA1C" in the last 72 hours. Lipid Profile No results for input(s): "CHOL", "HDL", "LDLCALC", "TRIG", "CHOLHDL", "LDLDIRECT" in the last 72 hours. Thyroid function  studies Recent Labs    08/03/23 0614  TSH 0.456   Anemia work up No results for input(s): "VITAMINB12", "FOLATE", "FERRITIN", "TIBC", "IRON", "RETICCTPCT" in the last 72 hours. Urinalysis    Component Value Date/Time   COLORURINE YELLOW 08/02/2023 1624   APPEARANCEUR HAZY (A) 08/02/2023 1624   LABSPEC 1.014 08/02/2023 1624   PHURINE 6.0 08/02/2023 1624   GLUCOSEU NEGATIVE 08/02/2023 1624   HGBUR SMALL (A) 08/02/2023 1624   BILIRUBINUR NEGATIVE 08/02/2023 1624   BILIRUBINUR Negative 05/16/2022 1459   KETONESUR 5 (A) 08/02/2023 1624   PROTEINUR 30 (A) 08/02/2023 1624   UROBILINOGEN 0.2 05/16/2022 1459   NITRITE NEGATIVE 08/02/2023 1624   LEUKOCYTESUR TRACE (A) 08/02/2023 1624   Sepsis Labs Recent Labs  Lab 07/31/23 0903 08/02/23 1624 08/03/23 0614  WBC 6.8 5.7 4.6   Microbiology Recent Results (from the past 240 hour(s))  CSF culture w Gram Stain     Status: None (Preliminary result)   Collection Time: 08/03/23  3:58 PM   Specimen: CSF; Cerebrospinal Fluid  Result Value Ref Range Status   Specimen Description CSF  Final   Special Requests NONE  Final   Gram Stain   Final    NO WBC  SEEN NO ORGANISMS SEEN CYTOSPIN SMEAR Performed at [facility], [address], [place], [place] [address]    Culture PENDING  Incomplete   Report Status PENDING  Incomplete    Please note: You were cared for by a hospitalist during your hospital stay. Once you are discharged, your primary care physician will handle any further medical issues. Please note that NO REFILLS for any discharge medications will be authorized once you are discharged, as it is imperative that you return to your primary care physician (or establish a relationship with a primary care physician if you do not have one) for your post hospital discharge needs so that they can reassess your need for medications and monitor your lab values.    Time coordinating discharge: 40 minutes  SIGNED:   Burnadette Pop, MD  [facility] 08/04/2023, 10:00 AM Pager [phone]  If 7PM-7AM, please contact night-coverage [email or web address] Password [email or web address]

## 2023-08-05 ENCOUNTER — Inpatient Hospital Stay (HOSPITAL_BASED_OUTPATIENT_CLINIC_OR_DEPARTMENT_OTHER): Payer: BC Managed Care – PPO | Admitting: Hematology

## 2023-08-05 DIAGNOSIS — C172 Malignant neoplasm of ileum: Secondary | ICD-10-CM | POA: Diagnosis not present

## 2023-08-05 LAB — MISC LABCORP TEST (SEND OUT)
Labcorp test code: 505625
Source (LabCorp): 2

## 2023-08-05 LAB — IGG CSF INDEX
Albumin CSF-mCnc: 10 mg/dL (ref 8–37)
Albumin: 3.5 g/dL — ABNORMAL LOW (ref 3.8–4.9)
CSF IgG Index: 0.6 (ref 0.0–0.7)
IgG (Immunoglobin G), Serum: 1973 mg/dL — ABNORMAL HIGH (ref 586–1602)
IgG, CSF: 3.5 mg/dL (ref 0.0–6.7)
IgG/Alb Ratio, CSF: 0.35 — ABNORMAL HIGH (ref 0.00–0.25)

## 2023-08-05 LAB — CYTOLOGY - NON PAP

## 2023-08-05 NOTE — Progress Notes (Signed)
[name]   [phone] [phone]   Clinic Follow up Note   Patient Care Team: Arnette Felts, [name] as PCP - General (General Practice) Malachy Mood, MD as Consulting Physician (Medical Oncology) Berna Bue, MD as Consulting Physician (General Surgery) Griselda Miner, MD as Consulting Physician (General Surgery) Johnnette Barrios, RN as Registered Nurse 08/05/2023  I connected with @PTNAME @ on 08/05/23 at 10:40 AM EDT by telephone and verified that I am speaking with the correct person using two identifiers.   I discussed the limitations, risks, security and privacy concerns of performing an evaluation and management service by telephone and the availability of in person appointments. I also discussed with the patient that there may be a patient responsible charge related to this service. The patient expressed understanding and agreed to proceed.   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  [name] in the liver MRI also showed a 1.8 cm oligo met in segment 7 of liver. -We discussed the incurable nature of her metastatic disease, due to her metastasis in peritoneum and liver.  We also discussed the small possibility of HIPEC and liver directed therapy if she has excellent response to chemotherapy. -I recommend first-line chemotherapy FOLFOX.  She is not a candidate for first-line immunotherapy given MMR normal.  I will order NexGen or sequencing Tempus to see if she is a candidate for targeted therapy.  Given the primary tumor in  terminal ileum, there is no benefit of VEGFR inhibitor even if her tumor is wild type of KRAS/NRAS/BRAF. Benefit of bevacizumab is also uncertain given the primary tumor in small intestine.  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 Brain MRI showed a gray spot, cause uncertain. Currently on antiepileptic medication. -Continue current antiepileptic medication. -Schedule appointment with Dr. Barbaraann Cao (neurologist) for further evaluation. I will talk to him  -Not a common side effect from 5-FU, she does not have excessive other side effects on [name] 5-FU.   Chemotherapy-induced nausea and vomiting Reported vomiting following 5-FU pump infusion. -Monitor and manage symptoms as needed.  Cancer treatment plan Seizures potentially linked to 5-FU chemotherapy. Uncertainty about continuation of current regimen. -Discuss with Dr. Lance Morin regarding the potential link between seizures and chemotherapy. -Consider alternative chemotherapy drug, capecitabine, pending discussion with Dr. Barbaraann Cao  Plan -urgent referral to Dr. Barbaraann Cao  -will change her chemo treatment, depends on the evaluation by Dr. Barbaraann Cao        SUMMARY OF ONCOLOGIC HISTORY: Oncology History  Cancer of ileum with carcinomatosis  07/10/2023 Initial Diagnosis   Cancer of ileum with carcinomatosis   07/22/2023 Cancer Staging   Staging form: Small Intestine - Adenocarcinoma, AJCC 8th Edition - Pathologic: Stage IV (pT4, pNX, pM1) - Signed by Malachy Mood, MD on 07/22/2023 Histologic grade (G): G2 Histologic grading system: 4 grade system   07/31/2023 -  Chemotherapy   Patient is on Treatment Plan : COLORECTAL FOLFOX q14d       Discussed the use of AI scribe software for clinical note transcription with the patient, who gave verbal consent to proceed.  History of Present Illness  Linda [name], a 57 year old female with a history of cancer, presents with new onset seizures. The seizures began on  the day her chemotherapy pump was removed, approximately two days after her first cycle of chemotherapy. She describes experiencing twitching on both sides of her body, facial drooping, clamminess, and blurred vision during the seizures. She had multiple seizures, including one in the emergency department, which were eventually controlled with increased doses of antiseizure medication. She is currently on oral antiseizure medication. She has no personal or family history of seizures.  In addition to the seizures, she experienced vomiting after her chemotherapy treatment. She reports that she is now eating well. She also mentions that she did not receive one of the planned chemotherapy drugs, oxaliplatin, due to an open wound.         REVIEW OF SYSTEMS:   Constitutional: Denies fevers, chills or abnormal weight loss Eyes: Denies blurriness of vision Ears, nose, mouth, throat, and face: Denies mucositis or sore throat Respiratory: Denies cough, dyspnea or wheezes Cardiovascular: Denies palpitation, chest discomfort or lower extremity swelling Gastrointestinal:  Denies nausea, heartburn or change in bowel habits Skin: Denies abnormal skin rashes Lymphatics: Denies new lymphadenopathy or easy bruising Neurological:Denies numbness, tingling or new weaknesses Behavioral/Psych: Mood is stable, no new changes  All other systems were reviewed with the patient and are negative.  MEDICAL HISTORY:  Past Medical History:  Diagnosis Date   Anemia    Hypertension    Pre-diabetes 02/2022    SURGICAL HISTORY: Past Surgical History:  Procedure Laterality Date   BREAST LUMPECTOMY WITH RADIOACTIVE SEED LOCALIZATION Left 06/10/2022   Procedure: LEFT BREAST LUMPECTOMY WITH RADIOACTIVE SEED LOCALIZATION;  Surgeon: Griselda Miner, MD;  Location: [facility];  Service: General;  Laterality: Left;   CESAREAN SECTION     x4   IR IMAGING GUIDED PORT INSERTION  07/08/2023   LAPAROTOMY N/A  06/30/2023   Procedure: EXPLORATORY LAPAROTOMY, SMALL BOWEL RESECTION, AND EXCISIONAL BIOPSIES OF MULTIPLE OMENTAL MASSES;  Surgeon: Berna Bue, MD;  Location: [facility];  Service: General;  Laterality: N/A;   TUBAL LIGATION      I have reviewed the social history and family history with the patient and they are unchanged from previous note.  ALLERGIES:  has No Known Allergies.  MEDICATIONS:  Current Outpatient Medications  Medication Sig Dispense Refill   acetaminophen (TYLENOL) 500 MG tablet Take 2 tablets (1,000 mg total) by mouth every 8 (eight) hours as needed for mild pain or fever.     Lacosamide 100 MG TABS Take 1 tablet (100 mg total) by mouth 2 (two) times daily. 120 tablet 0   levETIRAcetam (KEPPRA) 1000 MG tablet Take 1 tablet (1,000 mg total) by mouth 2 (two) times daily. 120 tablet 0   lidocaine-prilocaine (EMLA) cream Apply to affected area once 30 g 3   methocarbamol (ROBAXIN) 500 MG tablet Take 2 tablets (1,000 mg total) by mouth every 8 (eight) hours as needed for muscle spasms. 30 tablet 0   ondansetron (ZOFRAN) 8 MG tablet Take 1 tablet (8 mg total) by mouth every 8 (eight) hours as needed for nausea or vomiting. Start on the third day after chemotherapy. 30 tablet 1   oxyCODONE (OXY IR/ROXICODONE) 5 MG immediate release tablet Take 1-2 tablets (5-10 mg total) by mouth every 6 (six) hours as needed for moderate pain or severe pain (5mg  moderate, 10mg  severe). (Patient taking differently: Take 5 mg by mouth every 6 (six) hours as needed for moderate pain  or severe pain.) 60 tablet 0   pantoprazole (PROTONIX) 40 MG tablet Take 1 tablet (40 mg total) by mouth 2 (two) times daily. 60 tablet 1   polyethylene glycol powder (GLYCOLAX/MIRALAX) 17 GM/SCOOP powder Take 17 g by mouth daily mixed in liquid as directed. (Patient taking differently: Take 17 g by mouth daily as needed for mild constipation.) 238 g 0   prochlorperazine (COMPAZINE) 10 MG tablet Take 1 tablet (10 mg total)  by mouth every 6 (six) hours as needed for nausea or vomiting. 30 tablet 1   simethicone (MYLICON) 80 MG chewable tablet Chew 1 tablet (80 mg total) by mouth 4 (four) times daily as needed for flatulence. 100 tablet 0   No current facility-administered medications for this visit.    PHYSICAL EXAMINATION: Not performed   LABORATORY DATA:  I have reviewed the data as listed    Latest Ref Rng & Units 08/03/2023    6:14 AM 08/02/2023    4:24 PM 07/31/2023    9:03 AM  CBC  WBC 4.0 - 10.5 K/uL 4.6  5.7  6.8   Hemoglobin 12.0 - 15.0 g/dL 78.2  95.6  21.3   Hematocrit 36.0 - 46.0 % 36.4  39.0  34.9   Platelets 150 - 400 K/uL 220  268  298         Latest Ref Rng & Units 08/03/2023    6:20 AM 08/03/2023    6:14 AM 08/02/2023    4:24 PM  CMP  Glucose 70 - 99 mg/dL  086  578   BUN 6 - 20 mg/dL  5  <5   Creatinine 4.69 - 1.00 mg/dL  6.29  5.28   Sodium 413 - 145 mmol/L  137  138   Potassium 3.5 - 5.1 mmol/L  3.7  3.4   Chloride 98 - 111 mmol/L  102  98   CO2 22 - 32 mmol/L  21  24   Calcium 8.9 - 10.3 mg/dL  8.9  9.8   Total Protein 6.5 - 8.1 g/dL 7.2     Total Bilirubin 0.3 - 1.2 mg/dL 0.7     Alkaline Phos 38 - 126 U/L 81     AST 15 - 41 U/L 23     ALT 0 - 44 U/L 15         RADIOGRAPHIC STUDIES: I have personally reviewed the radiological images as listed and agreed with the findings in the report. No results found.     I discussed the assessment and treatment plan with the patient. The patient was provided an opportunity to ask questions and all were answered. The patient agreed with the plan and demonstrated an understanding of the instructions.   The patient was advised to call back or seek an in-person evaluation if the symptoms worsen or if the condition fails to improve as anticipated.  I provided 22 minutes of non face-to-face telephone visit time during this encounter, and > 50% was spent counseling as documented under my assessment & plan.     Malachy Mood,  MD 08/05/23

## 2023-08-06 ENCOUNTER — Telehealth: Payer: Self-pay | Admitting: Internal Medicine

## 2023-08-06 ENCOUNTER — Other Ambulatory Visit: Payer: Self-pay | Admitting: Radiation Therapy

## 2023-08-06 ENCOUNTER — Other Ambulatory Visit: Payer: Self-pay

## 2023-08-06 ENCOUNTER — Telehealth: Payer: Self-pay

## 2023-08-06 LAB — CSF CULTURE W GRAM STAIN: Gram Stain: NONE SEEN

## 2023-08-06 LAB — VITAMIN B1: Vitamin B1 (Thiamine): 88 nmol/L (ref 66.5–200.0)

## 2023-08-06 NOTE — Telephone Encounter (Signed)
Scheduled per providers orders, patient has been called and notified.

## 2023-08-06 NOTE — Progress Notes (Signed)
Madelaine Bhat, CMA,acting as a Neurosurgeon for Arnette Felts, [name].,have documented all relevant documentation on the behalf of Arnette Felts, [name],as directed by  Arnette Felts, [name] while in the presence of Arnette Felts, [name].  Subjective:  Patient ID: Linda [name] , female    DOB: October 21, 1966 , 57 y.o.   MRN: [id number]  Chief Complaint  Patient presents with   Hospitalization Follow-up    HPI  Patient presents today for a hospital follow up, Patient reports compliance with medication. Patient denies any chest pain, SOB, or headaches. Patient went to the hospital on 08/02/23 and discharged 08/04/2023. Patient was diagnosed with Seizure-like activity and brain lesions.  Linda [name] was started on keppra and Vimpat and to f/u with Neurology in 1-2 weeks. Linda [name] did have a spinal tap all normal except 1 WBC, that did not feel this was infectious. 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    Today patient is feeling okay, Linda [name] reports her incision now has a smell that Linda [name] first noticed a few days ago. Linda [name] seen the Surgeon on Friday to help closure. Now her daughter and patient feels is worse can smell an odor. Continues to drain.   Linda [name] is sleeping a lot now with the Keppra and Vimpat.  Linda [name] feels like Linda [name] tolerated the chemo. Her chemo was stopped at 10am and her seizure activity started at 2p.   BP Readings from Last 3 Encounters: 08/07/23 : 132/72 08/04/23 : 104/66 08/02/23 : 129/74       Past Medical History:  Diagnosis Date   Abnormal uterine bleeding 04/10/2022   Anemia    Hypertension    Pre-diabetes 02/2022     Family History  Problem Relation Age of Onset   Memory loss Mother    Hypertension Father    Hypertension Brother    Diabetes Maternal Grandmother    Hypertension Maternal Grandmother    Hypertension Maternal Grandfather    Colon  cancer Neg Hx    Stomach cancer Neg Hx    Esophageal cancer Neg Hx      Current Outpatient Medications:    acetaminophen (TYLENOL) 500 MG tablet, Take 2 tablets (1,000 mg total) by mouth every 8 (eight) hours as needed for mild pain or fever., Disp: , Rfl:    Lacosamide 100 MG TABS, Take 1 tablet (100 mg total) by mouth 2 (two) times daily., Disp: 120 tablet, Rfl: 0   levETIRAcetam (KEPPRA) 1000 MG tablet, Take 1 tablet (1,000 mg total) by mouth 2 (two) times daily., Disp: 120 tablet, Rfl: 0   methocarbamol (ROBAXIN) 500 MG tablet, Take 2 tablets (1,000 mg total) by mouth every 8 (eight) hours as needed for muscle spasms., Disp: 30 tablet, Rfl: 0   oxyCODONE (OXY IR/ROXICODONE) 5 MG immediate release tablet, Take 1-2 tablets (5-10 mg total) by mouth every 6 (six) hours as needed for moderate pain or severe pain (5mg  moderate, 10mg  severe). (Patient taking differently: Take 5 mg by mouth every 6 (six) hours as needed for moderate pain (pain score 4-6) or severe pain (pain score 7-10).), Disp: 60 tablet, Rfl: 0   polyethylene glycol powder (GLYCOLAX/MIRALAX) 17 GM/SCOOP powder, Take 17 g by mouth daily mixed in liquid as directed., Disp: 238 g, Rfl: 0   simethicone (MYLICON) 80 MG chewable tablet, Chew 1 tablet (80 mg total) by mouth 4 (four)  times daily as needed for flatulence., Disp: 100 tablet, Rfl: 0   amoxicillin-clavulanate (AUGMENTIN) 875-125 MG tablet, Take 1 tablet by mouth every 12 (twelve) hours for 7 days, Disp: 14 tablet, Rfl: 0   capecitabine (XELODA) 500 MG tablet, Take 3 tablets (1,500 mg total) by mouth 2 (two) times daily after a meal. Take within 30 minutes after meal. Take for 7 days on, then 7 days off, repeat every 14 days., Disp: 84 tablet, Rfl: 0   diclofenac Sodium (VOLTAREN) 1 % GEL, Research Patient: Apply 0.5 grams (1 fingertip) to each hand and each foot twice daily for up to 12 weeks, Disp: 400 g, Rfl: 0   LORazepam (ATIVAN) 1 MG tablet, Take 1 tablet (1 mg total) by  mouth every 6 (six) hours as needed for anxiety., Disp: 15 tablet, Rfl: 0   nitroGLYCERIN (NITROSTAT) 0.4 MG SL tablet, Place 1 tablet (0.4 mg total) under the tongue every 5 (five) minutes as needed for chest pain., Disp: 25 tablet, Rfl: 0 No current facility-administered medications for this visit.  Facility-Administered Medications Ordered in Other Visits:    dextrose 5 % solution, , Intravenous, Continuous, Malachy Mood, MD, Stopped at 09/17/23 1519   sodium chloride flush (NS) 0.9 % injection 10 mL, 10 mL, Intracatheter, PRN, Malachy Mood, MD, 10 mL at 09/17/23 1517   No Known Allergies   Review of Systems  Constitutional: Negative.   HENT: Negative.    Eyes: Negative.   Respiratory: Negative.    Cardiovascular: Negative.   Gastrointestinal: Negative.   Skin:        Surgical wound mid abdomen dressing intact with more open area to upper part.   Psychiatric/Behavioral: Negative.       Today's Vitals   08/07/23 0834  BP: 132/72  Pulse: 79  Temp: 98.3 F (36.8 C)  TempSrc: Oral  Weight: 180 lb (81.6 kg)  Height: 5\' 4"  (1.626 m)  PainSc: 0-No pain   Body mass index is 30.9 kg/m.  Wt Readings from Last 3 Encounters:  09/17/23 180 lb 6.4 oz (81.8 kg)  09/04/23 177 lb 14.4 oz (80.7 kg)  08/28/23 183 lb 6.4 oz (83.2 kg)      Objective:  Physical Exam Vitals reviewed.  Constitutional:      General: Linda [name] is not in acute distress.    Appearance: Normal appearance. Linda [name] is obese.  Cardiovascular:     Rate and Rhythm: Normal rate and regular rhythm.     Pulses: Normal pulses.     Heart sounds: Normal heart sounds. No murmur heard. Pulmonary:     Effort: Pulmonary effort is normal. No respiratory distress.     Breath sounds: Normal breath sounds. No wheezing.  Skin:    General: Skin is warm and dry.     Capillary Refill: Capillary refill takes less than 2 seconds.     Comments: Dressing is intact with small amount of drainage with slight opening at the distal end now the top  area is more open.   Neurological:     General: No focal deficit present.     Mental Status: Linda [name] is alert and oriented to person, place, and time.     Cranial Nerves: No cranial nerve deficit.     Motor: No weakness.  Psychiatric:        Mood and Affect: Mood normal.        Behavior: Behavior normal.        Thought Content: Thought content normal.  Judgment: Judgment normal.         Assessment And Plan:  Seizure-like activity [facility]) Assessment & Plan: This is a new diagnosis and Linda [name] is following up with Neurology and currently tolerating medications well, was admitted due this reason   Brain lesion Assessment & Plan: This was found with her MRI of brain thought to be cytotoxic related.    Dehiscence of postoperative wound of abdomen Assessment & Plan: Called to general surgery office and they were able to get her scheduled for an appt to evaluate further.   Orders: -     CBC with Differential/Platelet  Type 2 diabetes mellitus with obesity (HCC) Assessment & Plan: At this time will continue to hold on her Ozempic. Continue focusing on healthy diet and regular diet  Orders: -     Microalbumin / creatinine urine ratio  Hospital discharge follow-up Assessment & Plan: TCM Performed. A member of the clinical team spoke with the patient upon dischare. Discharge summary was reviewed in full detail during the visit. Meds reconciled and compared to discharge meds. Medication list is updated and reviewed with the patient.  Greater than 50% face to face time was spent in counseling an coordination of care.  All questions were answered to the satisfaction of the patient.        Return for keep same next appt..  Patient was given opportunity to ask questions. Patient verbalized understanding of the plan and was able to repeat key elements of the plan. All questions were answered to their satisfaction.    Jeanell Sparrow, [name], have reviewed all documentation for this visit.  The documentation on 09/17/23 for the exam, diagnosis, procedures, and orders are all accurate and complete.   IF YOU HAVE BEEN REFERRED TO A SPECIALIST, IT MAY TAKE 1-2 WEEKS TO SCHEDULE/PROCESS THE REFERRAL. IF YOU HAVE NOT HEARD FROM US/SPECIALIST IN TWO WEEKS, PLEASE GIVE [place] A CALL AT [phone].

## 2023-08-06 NOTE — Transitions of Care (Post Inpatient/ED Visit) (Signed)
08/06/2023  Name: Linda [name] MRN: [id number] DOB: May 22, 1966  Today's TOC FU Call Status: Today's TOC FU Call Status:: Successful TOC FU Call Completed TOC FU Call Complete Date: 08/06/23 Patient's Name and Date of Birth confirmed.  Transition Care Management Follow-up Telephone Call Date of Discharge: 08/04/23 Discharge Facility: [facility]) Type of Discharge: Inpatient Admission Primary Inpatient Discharge Diagnosis:: Seizures ( s/p Chemo Tx) How have you been since you were released from the hospital?: Better Any questions or concerns?: No  Items Reviewed: Did you receive and understand the discharge instructions provided?: Yes Medications obtained,verified, and reconciled?: Yes (Medications Reviewed) Reason for Partial Mediation Review: Added Anti-convulsants Any new allergies since your discharge?: Yes (Consider adding Chemotherapy drug- she will check with Oncologist) Dietary orders reviewed?: Yes Type of Diet Ordered:: Reg diest, occ Carb control eating several small meals Do you have support at home?: Yes People in Home: child(ren), dependent Name of Support/Comfort Primary Source: Daughter Alen Blew Lilia Pro Glynis Smiles)  Medications Reviewed Today: Medications Reviewed Today     Reviewed by Johnnette Barrios, RN (Registered Nurse) on 08/06/23 at 1149  Med List Status: <None>   Medication Order Taking? Sig Documenting Provider Last Dose Status Informant  acetaminophen (TYLENOL) 500 MG tablet [id number] Yes Take 2 tablets (1,000 mg total) by mouth every 8 (eight) hours as needed for mild pain or fever. Juliet Rude, PA-C Taking Active Self, Multiple Informants  Lacosamide 100 MG TABS [id number] Yes Take 1 tablet (100 mg total) by mouth 2 (two) times daily. Burnadette Pop, MD Taking Active   levETIRAcetam (KEPPRA) 1000 MG tablet [id number] Yes Take 1 tablet (1,000 mg total) by mouth 2 (two) times daily. Burnadette Pop, MD Taking Active   lidocaine-prilocaine (EMLA)  cream [id number] Yes Apply to affected area once Malachy Mood, MD Taking Active Self, Multiple Informants           Med Note Sharon Seller, Jenavi Beedle L   Thu Jul 24, 2023  1:09 PM) Uses prior to chemo  methocarbamol (ROBAXIN) 500 MG tablet [id number] Yes Take 2 tablets (1,000 mg total) by mouth every 8 (eight) hours as needed for muscle spasms. Juliet Rude, PA-C Taking Active Self, Multiple Informants  ondansetron (ZOFRAN) 8 MG tablet [id number] Yes Take 1 tablet (8 mg total) by mouth every 8 (eight) hours as needed for nausea or vomiting. Start on the third day after chemotherapy. Malachy Mood, MD Taking Active Self, Multiple Informants           Med Note (SATTERFIELD, Genoveva Ill   VQQ Aug 02, 2023  5:00 PM)    oxyCODONE (OXY IR/ROXICODONE) 5 MG immediate release tablet [id number] Yes Take 1-2 tablets (5-10 mg total) by mouth every 6 (six) hours as needed for moderate pain or severe pain (5mg  moderate, 10mg  severe).  Patient taking differently: Take 5 mg by mouth every 6 (six) hours as needed for moderate pain (pain score 4-6) or severe pain (pain score 7-10).   Malachy Mood, MD Taking Active Self, Multiple Informants  pantoprazole (PROTONIX) 40 MG tablet [id number] Yes Take 1 tablet (40 mg total) by mouth 2 (two) times daily. Jonah Blue, MD Taking Active Self, Multiple Informants           Med Note Sharon Seller, Kassi Esteve L   Wed Aug 06, 2023 11:49 AM) Starting today  polyethylene glycol powder (GLYCOLAX/MIRALAX) 17 GM/SCOOP powder [id number] Yes Take 17 g by mouth daily mixed in liquid as directed.  Patient taking differently: Take 17 g by  mouth daily as needed for mild constipation.   Jonah Blue, MD Taking Active Self, Multiple Informants  prochlorperazine (COMPAZINE) 10 MG tablet [id number] Yes Take 1 tablet (10 mg total) by mouth every 6 (six) hours as needed for nausea or vomiting. Malachy Mood, MD Taking Active Self, Multiple Informants           Med Note Sharon Seller, Dahna Hattabaugh L   Fri Aug 01, 2023 10:31 AM) States this  is ineffective for nausea relief  simethicone (MYLICON) 80 MG chewable tablet [id number] Yes Chew 1 tablet (80 mg total) by mouth 4 (four) times daily as needed for flatulence. Jonah Blue, MD Taking Active Self, Multiple Informants            Home Care and Equipment/Supplies: Were Home Health Services Ordered?: No Any new equipment or medical supplies ordered?: No  Functional Questionnaire: Do you need assistance with bathing/showering or dressing?: No Do you need assistance with meal preparation?: No Do you need assistance with eating?: No Do you have difficulty maintaining continence: No Do you need assistance with getting out of bed/getting out of a chair/moving?: No (has  occ dizziness with rising) Do you have difficulty managing or taking your medications?: No  Follow up appointments reviewed: PCP Follow-up appointment confirmed?: Yes Arnette Felts) MD Provider Line [phone] Given: No Date of PCP follow-up appointment?: 08/07/23 Follow-up Provider: Arnette Felts Specialist [facility] Follow-up appointment confirmed?: Yes Date of Specialist follow-up appointment?: 08/12/23 Follow-Up Specialty Provider:: Oncology Dr Mosetta Putt Do you need transportation to your follow-up appointment?: No Do you understand care options if your condition(s) worsen?: Yes-patient verbalized understanding    Goals Addressed             This Visit's Progress    TOC Care Plan       Current Barriers:  Care Coordination needs related to multiple changes in medications and new abdominal wound- healing well no s/s infection    RNCM Clinical Goal(s):  Patient will  through collaboration with RN Care manager, provider, and care team( PCP, Oncology, Neurology).  demonstrate ongoing self health care management ability with medication management ,symptom control and no infections r/t abdominal wound   through collaboration with RN Care manager, provider, and care team.Has completed  Chemp prep  class 07/30/23  Next Chemo Treatment 08/12/23 with a 2 day on  2 weeks off cycle # of treatment unknown-Had reaction Oncology is working on new TX regimen- plan is for 14 day oral cycle     Interventions: Evaluation of current treatment plan related to  self management and patient's adherence to plan as established by provider     (Status:  Goal on track:  Yes.minor set back, is working with neurology and Oncology for a concurrent TX plan )  Short Term Goal Evaluation of current treatment plan related to  wound care   self-management and patient's adherence to plan as established by provider. Discussed plans with patient for ongoing care management follow up and provided patient with direct contact information for care management team Provided education to patient re: supplies need and wound care r/t dressing changes s/s of infections and when to call CM and or Provider Reviewed scheduled/upcoming provider appointments including multiple specialist appts ( Oncology, Surgical f/u) Discussed plans with patient for ongoing care management follow up and provided patient with direct contact information for care management team Advised patient to discuss increased nausea and poor sleep with provider  Pain Interventions:  (Status:  Goal on track:  Yes.) Short Term  Goal Pain assessment performed Medications reviewed Reviewed provider established plan for pain management Discussed importance of adherence to all scheduled medical appointments Counseled on the importance of reporting any/all new or changed pain symptoms or management strategies to pain management provider Reviewed with patient prescribed pharmacological and nonpharmacological pain relief strategies  Patient Goals/Self-Care Activities: Participate in Transition of Care Program/Attend TOC scheduled calls Take all medications as prescribed Attend all scheduled provider appointments Call pharmacy for medication refills 3-7 days in advance  of running out of medications Call provider office for new concerns or questions   Follow Up Plan:  Telephone follow up appointment with care management team member scheduled for:  08/13/23 @ 2:00pm  The patient has been provided with contact information for the care management team and has been advised to call with any health related questions or concerns.           Susa Loffler , BSN, RN Care Management Coordinator [place]   [facility] [email or web address] Direct Dial: [phone]

## 2023-08-07 ENCOUNTER — Ambulatory Visit: Payer: BC Managed Care – PPO | Admitting: Nurse Practitioner

## 2023-08-07 ENCOUNTER — Ambulatory Visit: Payer: BC Managed Care – PPO | Admitting: Internal Medicine

## 2023-08-07 ENCOUNTER — Encounter: Payer: Self-pay | Admitting: Hematology

## 2023-08-07 VITALS — BP 132/72 | HR 79 | Temp 98.3°F | Ht 64.0 in | Wt 180.0 lb

## 2023-08-07 DIAGNOSIS — G939 Disorder of brain, unspecified: Secondary | ICD-10-CM

## 2023-08-07 DIAGNOSIS — R569 Unspecified convulsions: Secondary | ICD-10-CM | POA: Diagnosis not present

## 2023-08-07 DIAGNOSIS — T81321A Disruption or dehiscence of closure of internal operation (surgical) wound of abdominal wall muscle or fascia, initial encounter: Secondary | ICD-10-CM

## 2023-08-07 DIAGNOSIS — Z09 Encounter for follow-up examination after completed treatment for conditions other than malignant neoplasm: Secondary | ICD-10-CM

## 2023-08-07 DIAGNOSIS — E1169 Type 2 diabetes mellitus with other specified complication: Secondary | ICD-10-CM | POA: Diagnosis not present

## 2023-08-07 DIAGNOSIS — E669 Obesity, unspecified: Secondary | ICD-10-CM | POA: Diagnosis not present

## 2023-08-08 ENCOUNTER — Other Ambulatory Visit (HOSPITAL_COMMUNITY): Payer: Self-pay

## 2023-08-08 LAB — CBC WITH DIFFERENTIAL/PLATELET
Basophils Absolute: 0 10*3/uL (ref 0.0–0.2)
Basos: 0 %
EOS (ABSOLUTE): 0 10*3/uL (ref 0.0–0.4)
Eos: 1 %
Hematocrit: 39.6 % (ref 34.0–46.6)
Hemoglobin: 12 g/dL (ref 11.1–15.9)
Immature Grans (Abs): 0 10*3/uL (ref 0.0–0.1)
Immature Granulocytes: 1 %
Lymphocytes Absolute: 1.3 10*3/uL (ref 0.7–3.1)
Lymphs: 24 %
MCH: 26 pg — ABNORMAL LOW (ref 26.6–33.0)
MCHC: 30.3 g/dL — ABNORMAL LOW (ref 31.5–35.7)
MCV: 86 fL (ref 79–97)
Monocytes Absolute: 0.4 10*3/uL (ref 0.1–0.9)
Monocytes: 7 %
Neutrophils Absolute: 3.5 10*3/uL (ref 1.4–7.0)
Neutrophils: 67 %
Platelets: 294 10*3/uL (ref 150–450)
RBC: 4.61 x10E6/uL (ref 3.77–5.28)
RDW: 21.4 % — ABNORMAL HIGH (ref 11.7–15.4)
WBC: 5.2 10*3/uL (ref 3.4–10.8)

## 2023-08-08 LAB — OLIGOCLONAL BANDS, CSF + SERM

## 2023-08-08 LAB — MICROALBUMIN / CREATININE URINE RATIO
Creatinine, Urine: 160.3 mg/dL
Microalb/Creat Ratio: 12 mg/g{creat} (ref 0–29)
Microalbumin, Urine: 19.6 ug/mL

## 2023-08-08 MED ORDER — AMOXICILLIN-POT CLAVULANATE 875-125 MG PO TABS
1.0000 | ORAL_TABLET | Freq: Two times a day (BID) | ORAL | 0 refills | Status: DC
Start: 2023-08-08 — End: 2023-10-30
  Filled 2023-08-08: qty 14, 7d supply, fill #0

## 2023-08-11 ENCOUNTER — Other Ambulatory Visit (HOSPITAL_COMMUNITY): Payer: Self-pay

## 2023-08-11 ENCOUNTER — Inpatient Hospital Stay: Payer: BC Managed Care – PPO

## 2023-08-12 ENCOUNTER — Inpatient Hospital Stay: Payer: BC Managed Care – PPO | Admitting: Internal Medicine

## 2023-08-12 VITALS — BP 144/78 | HR 91 | Temp 98.1°F | Resp 20 | Wt 183.6 lb

## 2023-08-12 DIAGNOSIS — R7303 Prediabetes: Secondary | ICD-10-CM | POA: Diagnosis not present

## 2023-08-12 DIAGNOSIS — C786 Secondary malignant neoplasm of retroperitoneum and peritoneum: Secondary | ICD-10-CM | POA: Diagnosis not present

## 2023-08-12 DIAGNOSIS — R569 Unspecified convulsions: Secondary | ICD-10-CM | POA: Diagnosis not present

## 2023-08-12 DIAGNOSIS — Z79899 Other long term (current) drug therapy: Secondary | ICD-10-CM | POA: Diagnosis not present

## 2023-08-12 DIAGNOSIS — Z5111 Encounter for antineoplastic chemotherapy: Secondary | ICD-10-CM | POA: Diagnosis not present

## 2023-08-12 DIAGNOSIS — D509 Iron deficiency anemia, unspecified: Secondary | ICD-10-CM | POA: Diagnosis not present

## 2023-08-12 DIAGNOSIS — C787 Secondary malignant neoplasm of liver and intrahepatic bile duct: Secondary | ICD-10-CM | POA: Diagnosis not present

## 2023-08-12 DIAGNOSIS — C179 Malignant neoplasm of small intestine, unspecified: Secondary | ICD-10-CM | POA: Diagnosis not present

## 2023-08-12 NOTE — Progress Notes (Signed)
[facility] [address]  [place], [place] [address] [phone]   New Patient Evaluation  Date of Service: 08/12/23 Patient Name: Linda [name] Patient MRN: [id number] Patient DOB: 1966-05-05 Provider: Henreitta Leber, MD  Identifying Statement:  Linda [name] is a 57 y.o. female with Seizures [facility]) who presents for initial consultation and evaluation regarding cancer associated neurologic deficits.    Referring Provider: Arnette Felts, [name] [address] [place],  [place] [address]  Primary Cancer:  Oncologic History: Oncology History  Cancer of ileum with carcinomatosis  07/10/2023 Initial Diagnosis   Cancer of ileum with carcinomatosis   07/22/2023 Cancer Staging   Staging form: Small Intestine - Adenocarcinoma, AJCC 8th Edition - Pathologic: Stage IV (pT4, pNX, pM1) - Signed by Malachy Mood, MD on 07/22/2023 Histologic grade (G): G2 Histologic grading system: 4 grade system   07/31/2023 -  Chemotherapy   Patient is on Treatment Plan : COLORECTAL FOLFOX q14d       History of Present Illness: The patient's records from the referring physician were obtained and reviewed and the patient interviewed to confirm this HPI.  Linda [name] presents to review recent neurologic episodes.  She and her daughter describe sudden onset right facial twitching, speech impairment, followed by twitching of all limbs, amnesia.  This was followed by confusion, lasting for almost a day.  Seizure episode occurred ~2 days after infusion of 5-Fu for peritoneal carcinomatosis.  The event recurred following admission to the hospital, similar semiology.  Patient has only faint recollection of each event.  She underwent brain MRI, long term EEG, lumbar puncture, was started on both Keppra and Vimpat. Since admission, she reports feeling at her baseline with no breakthrough seizures.  Positive family history of seizures in both mother and brother.  No  other notable epilepsy risk factors     Medications: Current Outpatient Medications on File Prior to Visit  Medication Sig Dispense Refill   acetaminophen (TYLENOL) 500 MG tablet Take 2 tablets (1,000 mg total) by mouth every 8 (eight) hours as needed for mild pain or fever.     amoxicillin-clavulanate (AUGMENTIN) 875-125 MG tablet Take 1 tablet by mouth every 12 (twelve) hours for 7 days 14 tablet 0   Lacosamide 100 MG TABS Take 1 tablet (100 mg total) by mouth 2 (two) times daily. 120 tablet 0   levETIRAcetam (KEPPRA) 1000 MG tablet Take 1 tablet (1,000 mg total) by mouth 2 (two) times daily. 120 tablet 0   lidocaine-prilocaine (EMLA) cream Apply to affected area once 30 g 3   methocarbamol (ROBAXIN) 500 MG tablet Take 2 tablets (1,000 mg total) by mouth every 8 (eight) hours as needed for muscle spasms. 30 tablet 0   ondansetron (ZOFRAN) 8 MG tablet Take 1 tablet (8 mg total) by mouth every 8 (eight) hours as needed for nausea or vomiting. Start on the third day after chemotherapy. 30 tablet 1   oxyCODONE (OXY IR/ROXICODONE) 5 MG immediate release tablet Take 1-2 tablets (5-10 mg total) by mouth every 6 (six) hours as needed for moderate pain or severe pain (5mg  moderate, 10mg  severe). (Patient taking differently: Take 5 mg by mouth every 6 (six) hours as needed for moderate pain (pain score 4-6) or severe pain (pain score 7-10).) 60 tablet 0   pantoprazole (PROTONIX) 40 MG tablet Take 1 tablet (40 mg total) by mouth 2 (two) times daily. 60 tablet 1   polyethylene glycol powder (GLYCOLAX/MIRALAX) 17  GM/SCOOP powder Take 17 g by mouth daily mixed in liquid as directed. (Patient taking differently: Take 17 g by mouth daily as needed for mild constipation.) 238 g 0   prochlorperazine (COMPAZINE) 10 MG tablet Take 1 tablet (10 mg total) by mouth every 6 (six) hours as needed for nausea or vomiting. 30 tablet 1   simethicone (MYLICON) 80 MG chewable tablet Chew 1 tablet (80 mg total) by mouth 4 (four)  times daily as needed for flatulence. 100 tablet 0   No current facility-administered medications on file prior to visit.    Allergies: No Known Allergies Past Medical History:  Past Medical History:  Diagnosis Date   Anemia    Hypertension    Pre-diabetes 02/2022   Past Surgical History:  Past Surgical History:  Procedure Laterality Date   BREAST LUMPECTOMY WITH RADIOACTIVE SEED LOCALIZATION Left 06/10/2022   Procedure: LEFT BREAST LUMPECTOMY WITH RADIOACTIVE SEED LOCALIZATION;  Surgeon: Griselda Miner, MD;  Location: [facility];  Service: General;  Laterality: Left;   CESAREAN SECTION     x4   IR IMAGING GUIDED PORT INSERTION  07/08/2023   LAPAROTOMY N/A 06/30/2023   Procedure: EXPLORATORY LAPAROTOMY, SMALL BOWEL RESECTION, AND EXCISIONAL BIOPSIES OF MULTIPLE OMENTAL MASSES;  Surgeon: Berna Bue, MD;  Location: [facility];  Service: General;  Laterality: N/A;   TUBAL LIGATION     Social History:  Social History   Socioeconomic History   Marital status: Single    Spouse name: Not on file   Number of children: 4   Years of education: Not on file   Highest education level: Some college, no degree  Occupational History    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  Tobacco Use   Smoking status: Never   Smokeless tobacco: Never  Vaping Use   Vaping status: Never Used  Substance and Sexual Activity   Alcohol use: No   Drug use: No   Sexual activity: Not Currently    Birth control/protection: Surgical  Other Topics Concern   Not on file  Social History Narrative   Not on file   Social Determinants of Health   Financial Resource Strain: Low Risk  (08/07/2023)   Overall Financial Resource Strain (CARDIA)    Difficulty of Paying Living Expenses: Not hard at all  Recent Concern: Financial Resource Strain - Medium Risk (07/30/2023)   Overall Financial Resource Strain (CARDIA)    Difficulty of Paying Living Expenses: Somewhat hard  Food Insecurity:  No Food Insecurity (08/07/2023)   Hunger Vital Sign    Worried About Running Out of Food in the Last Year: Never true    Ran Out of Food in the Last Year: Never true  Transportation Needs: No Transportation Needs (08/07/2023)   PRAPARE - Administrator, Civil Service (Medical): No    Lack of Transportation (Non-Medical): No  Physical Activity: Inactive (08/07/2023)   Exercise Vital Sign    Days of Exercise per Week: 0 days    Minutes of Exercise per Session: 0 min  Stress: No Stress Concern Present (08/07/2023)   [employer] of Occupational Health - Occupational Stress Questionnaire    Feeling of Stress : Only a little  Social Connections: Moderately Isolated (08/07/2023)   Social Connection and Isolation Panel [NHANES]    Frequency of Communication with Friends and Family: More than three times a week    Frequency of Social Gatherings with Friends and Family: More than three times a week  Attends Religious Services: More than 4 times per year    Active Member of Clubs or Organizations: No    Attends Banker Meetings: Not on file    Marital Status: Never married  Intimate Partner Violence: Not At Risk (06/20/2023)   Humiliation, Afraid, Rape, and Kick questionnaire    Fear of Current or Ex-Partner: No    Emotionally Abused: No    Physically Abused: No    Sexually Abused: No   Family History:  Family History  Problem Relation Age of Onset   Memory loss Mother    Hypertension Father    Hypertension Brother    Diabetes Maternal Grandmother    Hypertension Maternal Grandmother    Hypertension Maternal Grandfather    Colon cancer Neg Hx    Stomach cancer Neg Hx    Esophageal cancer Neg Hx     Review of Systems: Constitutional: Doesn't report fevers, chills or abnormal weight loss Eyes: Doesn't report blurriness of vision Ears, nose, mouth, throat, and face: Doesn't report sore throat Respiratory: Doesn't report cough, dyspnea or  wheezes Cardiovascular: Doesn't report palpitation, chest discomfort  Gastrointestinal:  Doesn't report nausea, constipation, diarrhea GU: Doesn't report incontinence Skin: Doesn't report skin rashes Neurological: Per HPI Musculoskeletal: Doesn't report joint pain Behavioral/Psych: Doesn't report anxiety  Physical Exam: Vitals:   08/12/23 1443  BP: (!) 144/78  Pulse: 91  Resp: 20  Temp: 98.1 F (36.7 C)  SpO2: 100%   KPS: 90. General: Alert, cooperative, pleasant, in no acute distress Head: Normal EENT: No conjunctival injection or scleral icterus.  Lungs: Resp effort normal Cardiac: Regular rate Abdomen: Non-distended abdomen Skin: No rashes cyanosis or petechiae. Extremities: No clubbing or edema  Neurologic Exam: Mental Status: Awake, alert, attentive to examiner. Oriented to self and environment. Language is fluent with intact comprehension.  Cranial Nerves: Visual acuity is grossly normal. Visual fields are full. Extra-ocular movements intact. No ptosis. Face is symmetric Motor: Tone and bulk are normal. Power is full in both arms and legs. Reflexes are symmetric, no pathologic reflexes present.  Sensory: Intact to light touch Gait: Normal.   Labs: I have reviewed the data as listed    Component Value Date/Time   NA 137 08/03/2023 0614   NA 141 05/16/2022 1044   K 3.7 08/03/2023 0614   CL 102 08/03/2023 0614   CO2 21 (L) 08/03/2023 0614   GLUCOSE 101 (H) 08/03/2023 0614   BUN 5 (L) 08/03/2023 0614   BUN 9 05/16/2022 1044   CREATININE 0.80 08/03/2023 0614   CREATININE 0.66 07/31/2023 0903   CALCIUM 8.9 08/03/2023 0614   PROT 7.2 08/03/2023 0620   PROT 7.8 05/16/2022 1044   ALBUMIN 3.5 (L) 08/03/2023 1515   AST 23 08/03/2023 0620   AST 13 (L) 07/31/2023 0903   ALT 15 08/03/2023 0620   ALT 12 07/31/2023 0903   ALKPHOS 81 08/03/2023 0620   BILITOT 0.7 08/03/2023 0620   BILITOT 0.6 07/31/2023 0903   GFRNONAA >60 08/03/2023 0614   GFRNONAA >60 07/31/2023  0903   Lab Results  Component Value Date   WBC 5.2 08/07/2023   NEUTROABS 3.5 08/07/2023   HGB 12.0 08/07/2023   HCT 39.6 08/07/2023   MCV 86 08/07/2023   PLT 294 08/07/2023    Imaging:  Overnight EEG with video  Result Date: 08/03/2023 Charlsie Quest, MD     08/04/2023  8:56 AM Patient Name: Linda [name] MRN: [id number] Epilepsy Attending: Charlsie Quest Referring Physician/Provider: Iver Nestle,  Karmen Bongo, MD Duration: 08/03/2023 1610 to 08/04/2023 9604 Patient history:  57 yo F with sudden onset of clamminess and sweatiness at 2 PM with right eye and right arm twitching. Arm twitching resolved but right perioral twitching never completely resolved. EEG to evaluate for seizure Level of alertness: Awake, asleep AEDs during EEG study: LEV, LCM Technical aspects: This EEG study was done with scalp electrodes positioned according to the 10-20 International system of electrode placement. Electrical activity was reviewed with band pass filter of 1-70Hz , sensitivity of 7 uV/mm, display speed of 5mm/sec with a 60Hz  notched filter applied as appropriate. EEG data were recorded continuously and digitally stored.  Video monitoring was available and reviewed as appropriate. Description: The posterior dominant rhythm consists of 8 Hz activity of moderate voltage (25-35 uV) seen predominantly in posterior head regions, symmetric and reactive to eye opening and eye closing. Sleep was characterized by vertex waves, sleep spindles (12 to 14 Hz), maximal frontocentral region. EEG showed intermittent generalized polymorphic sharply contoured high amplitude 3 to 6 Hz theta-delta slowing. Hyperventilation and photic stimulation were not performed.   ABNORMALITY - Intermittent slow, generalized IMPRESSION: This study is suggestive of mild diffuse encephalopathy. No seizures or epileptiform discharges were seen throughout the recording. Please note that lack of epileptiform activity during interictal EEG does not  exclude the diagnosis of epilepsy.  Charlsie Quest   MR Brain W and Wo Contrast  Result Date: 08/02/2023 CLINICAL DATA:  Initial evaluation for neuro deficit, stroke suspected. EXAM: MRI HEAD WITHOUT AND WITH CONTRAST TECHNIQUE: Multiplanar, multiecho pulse sequences of the brain and surrounding structures were obtained without and with intravenous contrast. CONTRAST:  8mL GADAVIST GADOBUTROL 1 MMOL/ML IV SOLN COMPARISON:  None available. FINDINGS: Brain: Cerebral volume within normal limits. Few punctate foci of T2/FLAIR hyperintensity noted within the subcortical white matter of the anterior right frontal lobe, nonspecific, but minimal in nature, less than is typically seen for patient age. 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 No significant associated T2/FLAIR signal or enhancement. No other evidence for acute or subacute ischemia. No other acute or chronic intracranial blood products. No mass lesion, midline shift or mass effect. No hydrocephalus or extra-axial fluid collection. Empty sella noted. No abnormal enhancement or evidence for intracranial metastatic disease. Vascular: Major intracranial vascular flow voids are well maintained. Skull and upper cervical spine: Craniocervical junction within normal limits. Decreased T1 signal intensity seen throughout the visualized bone marrow, nonspecific, but most commonly related to anemia, smoking or obesity. No visible focal marrow replacing lesion. No scalp soft tissue abnormality. Sinuses/Orbits: Globes and orbital soft tissues within normal limits. Paranasal sinuses are clear. No mastoid effusion. Other: None. IMPRESSION: 1. 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 Differential considerations for this finding are broad,  including changes related to seizure, toxic metabolic derangement, demyelination, or less likely ischemia. 2. No other acute intracranial abnormality. No evidence for intracranial metastatic disease. 3. Empty sella. Electronically Signed   By: Rise Mu M.D.   On: 08/02/2023 20:10   CT PELVIS W CONTRAST  Result Date: 07/16/2023 CLINICAL DATA:  58 year old woman with history of right lower quadrant postsurgical abscess underwent CT-guided drain placement on 07/11/2023. EXAM: CT PELVIS WITH CONTRAST TECHNIQUE: Multidetector CT imaging of the pelvis was performed using the standard  protocol following the bolus administration of intravenous contrast. RADIATION DOSE REDUCTION: This exam was performed according to the departmental dose-optimization program which includes automated exposure control, adjustment of the mA and/or kV according to patient size and/or use of iterative reconstruction technique. CONTRAST:  OMNIPAQUE IOHEXOL 300 MG/ML  SOLN COMPARISON:  07/09/2023 FINDINGS: Urinary Tract:  No abnormality visualized. Bowel: Right lower quadrant drain is present. No significant residual abscess cavity is seen. Mixed solid cystic midline pelvic mass is again seen consistent with metastatic disease. This mass is either invading or originating from the distal sigmoid colon. Vascular/Lymphatic: No significant abnormality of the vasculature. Reproductive: Small amount of endometrial fluid is noted. Hypoechoic region along the posterior lower uterine segment best seen on image 75 of series 6 measuring 1.9 x 1.2 cm is suspicious for direct invasion from pelvic mass. Other: Mild anterior abdominal subcutaneous emphysema consistent with recent postop status. Musculoskeletal: No acute osseous abnormality. 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 IMPRESSION: 1. Right lower quadrant drain is in appropriate position. No significant residual abscess cavity is seen. 2. Unchanged malignant midline pelvic  mass. Electronically Signed   By: Acquanetta Belling M.D.   On: 07/16/2023 17:03   DG Sinus/Fist Tube Chk-Non GI  Result Date: 07/16/2023 INDICATION: 57 year old woman developed postop public abscess and underwent CT-guided drain placement on 07/11/2023. She has had less than 10 mL of output and CT shows resolution of abscess. EXAM: Fluoroscopic abscessogram MEDICATIONS: The patient is currently admitted to the hospital and receiving intravenous antibiotics. The antibiotics were administered within an appropriate time frame prior to the initiation of the procedure. ANESTHESIA/SEDATION: None COMPLICATIONS: None immediate. PROCEDURE: Informed written consent was obtained from the patient after a thorough discussion of the procedural risks, benefits and alternatives. All questions were addressed. A timeout was performed prior to the initiation of the procedure. Scout image shows the right pelvic drain in appropriate position. Contrast administered through the drain showed minimal abscess cavity with no fistulous communication. The drain was removed without difficulty. The site was covered with sterile dressing. IMPRESSION: Pelvic abscessogram shows no residual abscess. No fistulous communication was seen. The drain was removed. Electronically Signed   By: Acquanetta Belling M.D.   On: 07/16/2023 15:18    [facility] Clinician Interpretation: I have personally reviewed the radiological images as listed.  My interpretation, in the context of the patient's clinical presentation, is  seizure artifact, no T2 correlate   Assessment/Plan Seizures (HCC)  Linda [name] presents with clinical syndrome c/w novel seizures.  Suspect these are focal based on semiology, starting with right facial twitching and speech arrest, but EEG and MRI were non-localizing.  Etiology of seizures is unclear, family history is noteworthy.  Regarding the chemotherapy, I do not suspect this was the cause of the seizure(s) but rather a provoking factor.   Ok with proceeding with next cycle of FOLFOX given potential for mortality benefit in an aggressive cancer.  If seizure recurs during infusion, protocol can be amended or retired.    She is otherwise doing very well on Keppra 1000mg  BID and Vimpat 100mg  BID.  We will recommend she continue these doses for now.  Counseled on epilepsy safety; she will not drive until seizure-free for 6 months.  We spent twenty additional minutes teaching regarding the natural history, biology, and historical experience in the treatment of neurologic complications of cancer.   We appreciate the opportunity to participate in the care of [employer].  We ask that [facility]  Kisel return to clinic in 3 months, or sooner as needed.   All questions were answered. The patient knows to call the clinic with any problems, questions or concerns. No barriers to learning were detected.  The total time spent in the encounter was 40 minutes and more than 50% was on counseling and review of test results   Henreitta Leber, MD Medical Director of Neuro-Oncology [facility] at [place] 08/12/23 2:38 PM

## 2023-08-13 NOTE — Assessment & Plan Note (Signed)
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  [name] in the liver MRI also showed a 1.8 cm oligo met in segment 7 of liver. -We discussed the incurable nature of her metastatic disease, due to her metastasis in peritoneum and liver.  We also discussed the small possibility of HIPEC and liver directed therapy if she has excellent response to chemotherapy. -I recommend first-line chemotherapy FOLFOX.  She started on July 31, 2023, oxaliplatin was held for first cycle due to open wound. -She developed seizure activity after first cycle of 5-FU infusion. She was evaluated by new oncologist Dr. Barbaraann Cao earlier this week, her seizure was felt to be unlikely related to 5-FU.

## 2023-08-14 ENCOUNTER — Inpatient Hospital Stay: Payer: BC Managed Care – PPO

## 2023-08-14 ENCOUNTER — Other Ambulatory Visit: Payer: Self-pay

## 2023-08-14 ENCOUNTER — Inpatient Hospital Stay: Payer: BC Managed Care – PPO | Admitting: Hematology

## 2023-08-14 ENCOUNTER — Encounter: Payer: Self-pay | Admitting: Hematology

## 2023-08-14 ENCOUNTER — Telehealth: Payer: Self-pay

## 2023-08-14 VITALS — BP 137/67 | HR 64 | Temp 98.2°F | Resp 16 | Ht 64.0 in | Wt 184.6 lb

## 2023-08-14 DIAGNOSIS — C786 Secondary malignant neoplasm of retroperitoneum and peritoneum: Secondary | ICD-10-CM | POA: Diagnosis not present

## 2023-08-14 DIAGNOSIS — Z95828 Presence of other vascular implants and grafts: Secondary | ICD-10-CM

## 2023-08-14 DIAGNOSIS — C172 Malignant neoplasm of ileum: Secondary | ICD-10-CM

## 2023-08-14 DIAGNOSIS — C787 Secondary malignant neoplasm of liver and intrahepatic bile duct: Secondary | ICD-10-CM | POA: Diagnosis not present

## 2023-08-14 DIAGNOSIS — R7303 Prediabetes: Secondary | ICD-10-CM | POA: Diagnosis not present

## 2023-08-14 DIAGNOSIS — R569 Unspecified convulsions: Secondary | ICD-10-CM | POA: Diagnosis not present

## 2023-08-14 DIAGNOSIS — Z79899 Other long term (current) drug therapy: Secondary | ICD-10-CM | POA: Diagnosis not present

## 2023-08-14 DIAGNOSIS — D509 Iron deficiency anemia, unspecified: Secondary | ICD-10-CM | POA: Diagnosis not present

## 2023-08-14 DIAGNOSIS — Z5111 Encounter for antineoplastic chemotherapy: Secondary | ICD-10-CM | POA: Diagnosis not present

## 2023-08-14 DIAGNOSIS — C179 Malignant neoplasm of small intestine, unspecified: Secondary | ICD-10-CM | POA: Diagnosis not present

## 2023-08-14 LAB — CMP (CANCER CENTER ONLY)
ALT: 13 U/L (ref 0–44)
AST: 15 U/L (ref 15–41)
Albumin: 3.5 g/dL (ref 3.5–5.0)
Alkaline Phosphatase: 92 U/L (ref 38–126)
Anion gap: 7 (ref 5–15)
BUN: 5 mg/dL — ABNORMAL LOW (ref 6–20)
CO2: 28 mmol/L (ref 22–32)
Calcium: 8.9 mg/dL (ref 8.9–10.3)
Chloride: 105 mmol/L (ref 98–111)
Creatinine: 0.68 mg/dL (ref 0.44–1.00)
GFR, Estimated: >60 mL/min (ref >60)
Glucose, Bld: 95 mg/dL (ref 70–99)
Potassium: 3.5 mmol/L (ref 3.5–5.1)
Sodium: 140 mmol/L (ref 135–145)
Total Bilirubin: 0.3 mg/dL (ref 0.3–1.2)
Total Protein: 7.7 g/dL (ref 6.5–8.1)

## 2023-08-14 LAB — CBC WITH DIFFERENTIAL (CANCER CENTER ONLY)
Abs Immature Granulocytes: 0.02 10*3/uL (ref 0.00–0.07)
Basophils Absolute: 0 10*3/uL (ref 0.0–0.1)
Basophils Relative: 0 %
Eosinophils Absolute: 0.2 10*3/uL (ref 0.0–0.5)
Eosinophils Relative: 3 %
HCT: 34.5 % — ABNORMAL LOW (ref 36.0–46.0)
Hemoglobin: 10.5 g/dL — ABNORMAL LOW (ref 12.0–15.0)
Immature Granulocytes: 0 %
Lymphocytes Relative: 27 %
Lymphs Abs: 1.6 10*3/uL (ref 0.7–4.0)
MCH: 25.9 pg — ABNORMAL LOW (ref 26.0–34.0)
MCHC: 30.4 g/dL (ref 30.0–36.0)
MCV: 85.2 fL (ref 80.0–100.0)
Monocytes Absolute: 0.4 10*3/uL (ref 0.1–1.0)
Monocytes Relative: 6 %
Neutro Abs: 3.8 10*3/uL (ref 1.7–7.7)
Neutrophils Relative %: 64 %
Platelet Count: 353 10*3/uL (ref 150–400)
RBC: 4.05 MIL/uL (ref 3.87–5.11)
RDW: 21.4 % — ABNORMAL HIGH (ref 11.5–15.5)
WBC Count: 6 10*3/uL (ref 4.0–10.5)
nRBC: 0 % (ref 0.0–0.2)

## 2023-08-14 MED ORDER — SODIUM CHLORIDE 0.9 % IV SOLN
2400.0000 mg/m2 | INTRAVENOUS | Status: DC
  Administered 2023-08-14: 5000 mg via INTRAVENOUS
  Filled 2023-08-14: qty 100

## 2023-08-14 MED ORDER — OXALIPLATIN CHEMO INJECTION 100 MG/20ML
70.0000 mg/m2 | Freq: Once | INTRAVENOUS | Status: AC
  Administered 2023-08-14: 150 mg via INTRAVENOUS
  Filled 2023-08-14: qty 14.81

## 2023-08-14 MED ORDER — DEXTROSE 5 % IV SOLN
400.0000 mg/m2 | Freq: Once | INTRAVENOUS | Status: AC
  Administered 2023-08-14: 772 mg via INTRAVENOUS
  Filled 2023-08-14: qty 25

## 2023-08-14 MED ORDER — DEXAMETHASONE SODIUM PHOSPHATE 10 MG/ML IJ SOLN
10.0000 mg | Freq: Once | INTRAMUSCULAR | Status: AC
  Administered 2023-08-14: 10 mg via INTRAVENOUS
  Filled 2023-08-14: qty 1

## 2023-08-14 MED ORDER — SODIUM CHLORIDE 0.9% FLUSH
10.0000 mL | Freq: Once | INTRAVENOUS | Status: AC
  Administered 2023-08-14: 10 mL

## 2023-08-14 MED ORDER — DEXTROSE 5 % IV SOLN: Freq: Once | INTRAVENOUS | Status: AC

## 2023-08-14 MED ORDER — PALONOSETRON HCL INJECTION 0.25 MG/5ML
0.2500 mg | Freq: Once | INTRAVENOUS | Status: AC
  Administered 2023-08-14: 0.25 mg via INTRAVENOUS
  Filled 2023-08-14: qty 5

## 2023-08-14 NOTE — Patient Outreach (Signed)
Care Management  Transitions of Care Program Transitions of Care Post-discharge week 2  08/14/2023 Name: Linda [name] MRN: [id number] DOB: 05-14-1966  Subjective: [name] is a 57 y.o. year old female who is a primary care patient of Arnette Felts, [name]. The Care Management team was unable to reach the patient by phone to assess and address transitions of care needs.  Noted patient has Chemo Infusion today   Plan: Additional outreach attempts will be made to reach the patient enrolled in the [facility] Program (Post Inpatient/ED Visit).  Susa Loffler , BSN, RN Care Management Coordinator [place]   [facility] [email or web address] Direct Dial: [phone]

## 2023-08-14 NOTE — Patient Instructions (Signed)
[facility]  Discharge Instructions: Thank you for choosing [facility] to provide your oncology and hematology care.   If you have a lab appointment with the Cancer Center, please go directly to the Cancer Center and check in at the registration area.   Wear comfortable clothing and clothing appropriate for easy access to any Portacath or PICC line.   We strive to give you quality time with your provider. You may need to reschedule your appointment if you arrive late (15 or more minutes).  Arriving late affects you and other patients whose appointments are after yours.  Also, if you miss three or more appointments without notifying the office, you may be dismissed from the clinic at the provider's discretion.      For prescription refill requests, have your pharmacy contact our office and allow 72 hours for refills to be completed.    Today you received the following chemotherapy and/or immunotherapy agents: Oxaliplatin and Fluorouracil        To help prevent nausea and vomiting after your treatment, we encourage you to take your nausea medication as directed.  BELOW ARE SYMPTOMS THAT SHOULD BE REPORTED IMMEDIATELY: *FEVER GREATER THAN 100.4 F (38 C) OR HIGHER *CHILLS OR SWEATING *NAUSEA AND VOMITING THAT IS NOT CONTROLLED WITH YOUR NAUSEA MEDICATION *UNUSUAL SHORTNESS OF BREATH *UNUSUAL BRUISING OR BLEEDING *URINARY PROBLEMS (pain or burning when urinating, or frequent urination) *BOWEL PROBLEMS (unusual diarrhea, constipation, pain near the anus) TENDERNESS IN MOUTH AND THROAT WITH OR WITHOUT PRESENCE OF ULCERS (sore throat, sores in mouth, or a toothache) UNUSUAL RASH, SWELLING OR PAIN  UNUSUAL VAGINAL DISCHARGE OR ITCHING   Items with * indicate a potential emergency and should be followed up as soon as possible or go to the Emergency Department if any problems should occur.  Please show the CHEMOTHERAPY ALERT CARD or  IMMUNOTHERAPY ALERT CARD at check-in to the Emergency Department and triage nurse.  Should you have questions after your visit or need to cancel or reschedule your appointment, please contact [facility]  Dept: [phone]  and follow the prompts.  Office hours are 8:00 a.m. to 4:30 p.m. Monday - Friday. Please note that voicemails left after 4:00 p.m. may not be returned until the following business day.  We are closed weekends and major holidays. You have access to a nurse at all times for urgent questions. Please call the main number to the clinic Dept: [phone] and follow the prompts.   For any non-urgent questions, you may also contact your provider using MyChart. We now offer e-Visits for anyone 18 and older to request care online for non-urgent symptoms. For details visit [email or web address].   Also download the MyChart app! Go to the app store, search "MyChart", open the app, select [facility], and log in with your MyChart username and password.   

## 2023-08-14 NOTE — Progress Notes (Signed)
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  [name] in the liver MRI also showed a 1.8 cm oligo met in segment 7 of liver. -We discussed the incurable nature of her metastatic disease, due to her metastasis in peritoneum and liver.  We also discussed the small possibility of HIPEC and liver directed therapy if she has excellent response to chemotherapy. -I recommend first-line chemotherapy FOLFOX.  She started on July 31, 2023, oxaliplatin was held for first cycle due to open wound. -She developed seizure activity after first cycle of 5-FU infusion. She was evaluated by new oncologist Dr. Barbaraann Cao earlier this week, her seizure was felt to be unlikely related to 5-FU.   Assessment and Plan    Metastatic small bowel cancer Patient is to restart chemotherapy with 5-fu and oxaliplatin. Discussed the side effects of cold sensitivity and potential for permanent nerve damage.  We also reviewed how to use antiemetics -Advise patient to avoid cold food and drinks, and to use gloves when touching cold items.   Seizure Recent episode, currently on antiseizure medication. No recent issues reported. -Continue current  antiseizure medication. -Advise patient not to drive due to recent seizure.  Wound Care Improvement noted in wound condition. Patient is changing dressing daily. -Continue daily wound dressing changes. -Continue current antibiotic regimen for wound care.  Anemia Mild anemia noted on recent labs, similar to previous levels. -Monitor anemia, no intervention required at this time.  Follow-up Next appointment scheduled for August 28, 2023. -Advise patient to call if any problems arise before next appointment.  -Will proceed chemotherapy FOLFOX with reduced oxaliplatin dose today        SUMMARY OF ONCOLOGIC HISTORY: Oncology History  Cancer of ileum with carcinomatosis  07/10/2023 Initial Diagnosis   Cancer of ileum with carcinomatosis   07/22/2023 Cancer Staging   Staging form: Small Intestine - Adenocarcinoma, AJCC 8th Edition - Pathologic: Stage IV (pT4, pNX, pM1) - Signed by Malachy Mood, MD on 07/22/2023 Histologic grade (G): G2 Histologic grading system: 4 grade system   07/31/2023 -  Chemotherapy   Patient is on Treatment Plan : COLORECTAL FOLFOX q14d        Discussed the use of AI scribe software for clinical note transcription with the patient, who gave verbal consent to proceed.  History of Present Illness   A 57 year old female with a history of metastatic colon cancer presents for a follow-up visit. She recently experienced a seizure and has been having skin issues. The skin issues were reviewed by another physician, Dr. Wadie Lessen, who did not believe she was serious or primarily caused by chemotherapy. The patient also has a wound, which she reports is improving. She has been changing the dressing once a day and is now able to shower. She denies any other problems, including  chest pain or shortness of breath. She is due to start a combination of Fibrofuel and oxaliplatin and has been advised about the potential side effects, including cold sensitivity and possible nerve  damage. She reports her appetite is good and she has been able to maintain her weight and energy levels. She denies any pain or nausea.         All other systems were reviewed with the patient and are negative.  MEDICAL HISTORY:  Past Medical History:  Diagnosis Date   Anemia    Hypertension    Pre-diabetes 02/2022    SURGICAL HISTORY: Past Surgical History:  Procedure Laterality Date   BREAST LUMPECTOMY WITH RADIOACTIVE SEED LOCALIZATION Left 06/10/2022   Procedure: LEFT BREAST LUMPECTOMY WITH RADIOACTIVE SEED LOCALIZATION;  Surgeon: Griselda Miner, MD;  Location: [facility];  Service: General;  Laterality: Left;   CESAREAN SECTION     x4   IR IMAGING GUIDED PORT INSERTION  07/08/2023   LAPAROTOMY N/A 06/30/2023   Procedure: EXPLORATORY LAPAROTOMY, SMALL BOWEL RESECTION, AND EXCISIONAL BIOPSIES OF MULTIPLE OMENTAL MASSES;  Surgeon: Berna Bue, MD;  Location: [facility];  Service: General;  Laterality: N/A;   TUBAL LIGATION      I have reviewed the social history and family history with the patient and they are unchanged from previous note.  ALLERGIES:  has No Known Allergies.  MEDICATIONS:  Current Outpatient Medications  Medication Sig Dispense Refill   acetaminophen (TYLENOL) 500 MG tablet Take 2 tablets (1,000 mg total) by mouth every 8 (eight) hours as needed for mild pain or fever.     amoxicillin-clavulanate (AUGMENTIN) 875-125 MG tablet Take 1 tablet by mouth every 12 (twelve) hours for 7 days 14 tablet 0   Lacosamide 100 MG TABS Take 1 tablet (100 mg total) by mouth 2 (two) times daily. 120 tablet 0   levETIRAcetam (KEPPRA) 1000 MG tablet Take 1 tablet (1,000 mg total) by mouth 2 (two) times daily. 120 tablet 0   lidocaine-prilocaine (EMLA) cream Apply to affected area once 30 g 3   methocarbamol (ROBAXIN) 500 MG tablet Take 2 tablets (1,000 mg total) by mouth every 8 (eight) hours as needed for muscle spasms. 30 tablet 0   ondansetron (ZOFRAN) 8 MG  tablet Take 1 tablet (8 mg total) by mouth every 8 (eight) hours as needed for nausea or vomiting. Start on the third day after chemotherapy. 30 tablet 1   oxyCODONE (OXY IR/ROXICODONE) 5 MG immediate release tablet Take 1-2 tablets (5-10 mg total) by mouth every 6 (six) hours as needed for moderate pain or severe pain (5mg  moderate, 10mg  severe). (Patient taking differently: Take 5 mg by mouth every 6 (six) hours as needed for moderate pain (pain score 4-6) or severe pain (pain score 7-10).) 60 tablet 0   pantoprazole (PROTONIX) 40 MG tablet Take 1 tablet (40 mg total) by mouth 2 (two) times daily. 60 tablet 1   polyethylene glycol powder (GLYCOLAX/MIRALAX) 17 GM/SCOOP powder Take 17 g by mouth daily mixed in liquid as directed. (Patient taking differently: Take 17 g by mouth daily as needed for mild constipation.) 238 g 0   prochlorperazine (COMPAZINE) 10 MG tablet Take 1 tablet (10 mg total) by mouth every 6 (six) hours as needed for nausea or vomiting. 30 tablet 1   simethicone (MYLICON) 80 MG chewable tablet Chew 1 tablet (80 mg total) by mouth 4 (four) times daily as needed for flatulence. 100 tablet 0   No current facility-administered  medications for this visit.    PHYSICAL EXAMINATION: ECOG PERFORMANCE STATUS: 1 - Symptomatic but completely ambulatory  Vitals:   08/14/23 0852  BP: 137/67  Pulse: 64  Resp: 16  Temp: 98.2 F (36.8 C)  SpO2: 100%   Wt Readings from Last 3 Encounters:  08/14/23 184 lb 9.6 oz (83.7 kg)  08/12/23 183 lb 9.6 oz (83.3 kg)  08/07/23 180 lb (81.6 kg)     GENERAL:alert, no distress and comfortable SKIN: skin color, texture, turgor are normal, no rashes or significant lesions EYES: normal, Conjunctiva are pink and non-injected, sclera clear NECK: supple, thyroid normal size, non-tender, without nodularity LYMPH:  no palpable lymphadenopathy in the cervical, axillary  LUNGS: clear to auscultation and percussion with normal breathing effort HEART:  regular rate & rhythm and no murmurs and no lower extremity edema ABDOMEN:abdomen soft, non-tender and normal bowel sounds, there is 2 small and shallow openings in the midline incision, without significant discharge. Musculoskeletal:no cyanosis of digits and no clubbing  NEURO: alert & oriented x 3 with fluent speech, no focal motor/sensory deficits    LABORATORY DATA:  I have reviewed the data as listed    Latest Ref Rng & Units 08/14/2023    8:29 AM 08/07/2023    9:27 AM 08/03/2023    6:14 AM  CBC  WBC 4.0 - 10.5 K/uL 6.0  5.2  4.6   Hemoglobin 12.0 - 15.0 g/dL 62.9  52.8  41.3   Hematocrit 36.0 - 46.0 % 34.5  39.6  36.4   Platelets 150 - 400 K/uL 353  294  220         Latest Ref Rng & Units 08/14/2023    8:29 AM 08/03/2023    6:20 AM 08/03/2023    6:14 AM  CMP  Glucose 70 - 99 mg/dL 95   244   BUN 6 - 20 mg/dL 5   5   Creatinine 0.10 - 1.00 mg/dL 2.72   5.36   Sodium 644 - 145 mmol/L 140   137   Potassium 3.5 - 5.1 mmol/L 3.5   3.7   Chloride 98 - 111 mmol/L 105   102   CO2 22 - 32 mmol/L 28   21   Calcium 8.9 - 10.3 mg/dL 8.9   8.9   Total Protein 6.5 - 8.1 g/dL 7.7  7.2    Total Bilirubin 0.3 - 1.2 mg/dL 0.3  0.7    Alkaline Phos 38 - 126 U/L 92  81    AST 15 - 41 U/L 15  23    ALT 0 - 44 U/L 13  15        RADIOGRAPHIC STUDIES: I have personally reviewed the radiological images as listed and agreed with the findings in the report. No results found.    Orders Placed This Encounter  Procedures   CBC with Differential (Cancer Center Only)    Standing Status:   Future    Standing Expiration Date:   09/10/2024   CMP (Cancer Center only)    Standing Status:   Future    Standing Expiration Date:   09/10/2024   CBC with Differential (Cancer Center Only)    Standing Status:   Future    Standing Expiration Date:   09/24/2024   CMP (Cancer Center only)    Standing Status:   Future    Standing Expiration Date:   09/24/2024   All questions were answered. The  patient knows to call the clinic with  any problems, questions or concerns. No barriers to learning was detected. The total time spent in the appointment was 30 minutes.     Malachy Mood, MD 08/14/2023

## 2023-08-16 ENCOUNTER — Inpatient Hospital Stay: Payer: BC Managed Care – PPO

## 2023-08-16 ENCOUNTER — Other Ambulatory Visit: Payer: Self-pay

## 2023-08-16 ENCOUNTER — Other Ambulatory Visit: Payer: Self-pay | Admitting: Hematology

## 2023-08-16 ENCOUNTER — Other Ambulatory Visit (HOSPITAL_COMMUNITY): Payer: Self-pay

## 2023-08-16 DIAGNOSIS — C787 Secondary malignant neoplasm of liver and intrahepatic bile duct: Secondary | ICD-10-CM | POA: Diagnosis not present

## 2023-08-16 DIAGNOSIS — C172 Malignant neoplasm of ileum: Secondary | ICD-10-CM

## 2023-08-16 DIAGNOSIS — C179 Malignant neoplasm of small intestine, unspecified: Secondary | ICD-10-CM | POA: Diagnosis not present

## 2023-08-16 DIAGNOSIS — Z79899 Other long term (current) drug therapy: Secondary | ICD-10-CM | POA: Diagnosis not present

## 2023-08-16 DIAGNOSIS — Z5111 Encounter for antineoplastic chemotherapy: Secondary | ICD-10-CM | POA: Diagnosis not present

## 2023-08-16 DIAGNOSIS — D509 Iron deficiency anemia, unspecified: Secondary | ICD-10-CM | POA: Diagnosis not present

## 2023-08-16 DIAGNOSIS — R569 Unspecified convulsions: Secondary | ICD-10-CM | POA: Diagnosis not present

## 2023-08-16 DIAGNOSIS — R7303 Prediabetes: Secondary | ICD-10-CM | POA: Diagnosis not present

## 2023-08-16 DIAGNOSIS — C786 Secondary malignant neoplasm of retroperitoneum and peritoneum: Secondary | ICD-10-CM | POA: Diagnosis not present

## 2023-08-16 MED ORDER — LORAZEPAM 1 MG PO TABS
1.0000 mg | ORAL_TABLET | Freq: Four times a day (QID) | ORAL | 0 refills | Status: DC | PRN
  Filled 2023-08-16: qty 15, 4d supply, fill #0

## 2023-08-16 MED ORDER — LORAZEPAM 1 MG PO TABS
2.0000 mg | ORAL_TABLET | Freq: Once | ORAL | Status: AC
Start: 2023-08-16 — End: 2023-08-16
  Administered 2023-08-16: 2 mg via ORAL
  Filled 2023-08-16: qty 2

## 2023-08-16 NOTE — Progress Notes (Signed)
Pt presented to [facility] for 5FU pump stop appointment. At approximately 1125, while checking VS, pt stated she felt like she was "having a seizure". Pt Tachycardic. See flowsheet for full VS. Pt also shaking. No aura present, no LOC. Pt remained in WC throughout. Symptoms starting to improve at 1130. Pt with hx seizures. Pt verbalized taking seizure medications (Keppra and Lacosamide) this morning but was "a little late on taking them". Mosetta Putt, MD notified.   Per Mosetta Putt, MD and Barbaraann Cao, MD. To give 2mg  PO Ativan while in [facility] and to pick up prescription for Ativan at [facility]. Pt and family verbalize understanding. VSS at time of d/c. Pt left via WC to lobby with family. Per pt, her daughter is staying at the house with her.  Verbalized understanding of calling with questions/concerns. Given ED precautions.

## 2023-08-18 ENCOUNTER — Telehealth: Payer: Self-pay

## 2023-08-18 NOTE — Patient Outreach (Signed)
Care Management  Transitions of Care Program Transitions of Care Post-discharge week 4   08/18/2023 Name: Linda [name] MRN: [id number] DOB: 10/27/1965  Subjective: Linda [name] is a 57 y.o. year old female who is a primary care patient of Arnette Felts, [name]. The Care Management team Engaged with patient Engaged with patient by telephone to assess and address transitions of care needs.   Consent to Services:  Patient was given information about care management services, agreed to services, and gave verbal consent to participate.   Assessment:   Patient voices no new complaints Patient has not developed/ reported any new Medical issues / Dx or acute changes.- since last follow-up call for most recent  Hospital stay      06/19/24- 07/21/23 , 10/12- 10/14/ / 2024  She is feeling much better, She did have a Chemo TX Sat 10/26 and had a subsequent seizure. Thus [place] the 2nd one r/t Chemotherapy treatment The Oncologist was called , she had Ativan administered. She is following up with Dr Blake Divine- Oncology 0 next [facility] 11/7 and Her Neurologist regarding seizure medications and if any adjustments need to be made. Otherwise in good spirits , no pain, getting back into usual routine . Final call anticipated 08/22/23 after Surgical f/u 10/31  Patient educated on red flags s/s to watch for and was encouraged to report any of these identified , any new symptoms , changes in baseline or  medication regimen,  change in health status  /  well-being, or safety concerns to PCP and / or the  [facility] Case Management team .          SDOH Interventions    Flowsheet Row Telephone from 07/24/2023 in [place] [facility] DEPARTMENT Telephone from 07/17/2023 in [place] POPULATION HEALTH DEPARTMENT  SDOH Interventions    Food Insecurity Interventions Intervention Not Indicated --  Housing Interventions -- Intervention Not Indicated  Transportation Interventions -- Intervention Not Indicated  Utilities  Interventions Intervention Not Indicated --  Physical Activity Interventions Other (Comments)  [limited activity, starting Chemo next week] --        Goals Addressed             This Visit's Progress    TOC Care Plan       Current Barriers:  Care Coordination needs related to multiple changes in medications and new abdominal wound- healing well no s/s infection    RNCM Clinical Goal(s):  Patient will  through collaboration with RN Care manager, provider, and care team( PCP, Oncology, Neurology).  demonstrate ongoing self health care management ability with medication management ,symptom control and no infections r/t abdominal wound   through collaboration with RN Care manager, provider, and care team.Has completed  Chemp prep class 07/30/23  Next Chemo Treatment 08/28/23 with a 2 day on  2 weeks off cycle # of treatment unknown-Had reaction Oncology is working on new TX regimen- plan is for 14 day oral cycle  She will f/u with Neurology due to 2nd seizure r/t Chemo TX     Interventions: Evaluation of current treatment plan related to  self management and patient's adherence to plan as established by provider     (Status:  Goal on track:  Yes.minor set back, is working with neurology and Oncology for a concurrent TX plan )  Short Term Goal Evaluation of current treatment plan related to  wound care   self-management and patient's adherence to plan as established by provider. Discussed plans with patient for  ongoing care management follow up and provided patient with direct contact information for care management team Provided education to patient re: supplies need and wound care r/t dressing changes s/s of infections and when to call CM and or Provider Reviewed scheduled/upcoming provider appointments including multiple specialist appts ( Oncology, Surgical f/u) Discussed plans with patient for ongoing care management follow up and provided patient with direct contact information for care  management team Advised patient to discuss increased nausea and poor sleep with provider  Pain Interventions:  (Status:  Goal on track:  Yes.) Short Term Goal Pain assessment performed Medications reviewed Reviewed provider established plan for pain management Discussed importance of adherence to all scheduled medical appointments Counseled on the importance of reporting any/all new or changed pain symptoms or management strategies to pain management provider Reviewed with patient prescribed pharmacological and nonpharmacological pain relief strategies  Patient Goals/Self-Care Activities: Participate in Transition of Care Program/Attend TOC scheduled calls Take all medications as prescribed Attend all scheduled provider appointments Call pharmacy for medication refills 3-7 days in advance of running out of medications Call provider office for new concerns or questions   Follow Up Plan:  Telephone follow up appointment with care management team member scheduled for:  08/22/23 @ 10:30am  The patient has been provided with contact information for the care management team and has been advised to call with any health related questions or concerns.          Plan: The patient has been provided with contact information for the care management team and has been advised to call with any health related questions or concerns.  Routine follow-up and on-going assessment evaluation and education of disease processes, recommended interventions for both chronic and acute medical conditions , will occur during each weekly visit along with ongoing review of symptoms ,medication reviews and reconciliation. Any updates , inconsistencies, discrepancies or acute care concerns will be addressed and routed to the correct Practitioner if indicated   Please refer to Care Plan for goals and interventions -Effectiveness of interventions, symptom management and outcomes will be evaluated  weekly during [facility] 30-day  Program Outreach calls  . Any necessary  changes and updates to Care Plan will be completed episodically    Reviewed goals for care  Patient provided with Contact information and verbalized understanding with current POC.   Susa Loffler , BSN, RN Care Management Coordinator [place]   [facility] [email or web address] Direct Dial: [phone]

## 2023-08-19 ENCOUNTER — Other Ambulatory Visit (HOSPITAL_COMMUNITY): Payer: Self-pay

## 2023-08-19 ENCOUNTER — Telehealth: Payer: Self-pay

## 2023-08-19 ENCOUNTER — Encounter: Payer: Self-pay | Admitting: Hematology

## 2023-08-19 ENCOUNTER — Telehealth: Payer: Self-pay | Admitting: Hematology

## 2023-08-19 NOTE — Telephone Encounter (Signed)
Left voicemail requesting call back regarding attending physician statement document request.

## 2023-08-20 ENCOUNTER — Inpatient Hospital Stay (HOSPITAL_BASED_OUTPATIENT_CLINIC_OR_DEPARTMENT_OTHER): Payer: BC Managed Care – PPO | Admitting: Hematology

## 2023-08-20 ENCOUNTER — Encounter: Payer: Self-pay | Admitting: Hematology

## 2023-08-20 DIAGNOSIS — C172 Malignant neoplasm of ileum: Secondary | ICD-10-CM

## 2023-08-20 MED ORDER — CAPECITABINE 500 MG PO TABS: 850.0000 mg/m2 | ORAL_TABLET | Freq: Two times a day (BID) | ORAL | 0 refills | Status: DC

## 2023-08-20 NOTE — Progress Notes (Signed)
[facility]   Telephone:([phone]) [phone] Fax:([phone]) [phone]   Clinic Follow up Note   Patient Care Team: Arnette Felts, [name] as PCP - General (General Practice) Malachy Mood, MD as Consulting Physician (Medical Oncology) Berna Bue, MD as Consulting Physician (General Surgery) Griselda Miner, MD as Consulting Physician (General Surgery) Johnnette Barrios, RN as Registered Nurse 08/20/2023  I connected with Linda [name] on 08/20/23 at  3:00 PM EDT by telephone and verified that I am speaking with the correct person using two identifiers.   I discussed the limitations, risks, security and privacy concerns of performing an evaluation and management service by telephone and the availability of in person appointments. I also discussed with the patient that there may be a patient responsible charge related to this service. The patient expressed understanding and agreed to proceed.   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  [name] in the liver MRI also showed a 1.8 cm oligo met in segment 7 of liver. -We discussed the incurable nature of her metastatic disease, due to her metastasis in peritoneum and liver.  We also discussed the small possibility of HIPEC and liver directed therapy if she has excellent response to chemotherapy. -I recommend first-line chemotherapy FOLFOX.  She started on July 31, 2023, oxaliplatin was held for first cycle due to open wound. -She developed seizure activity after first cycle of 5-FU infusion. She was evaluated by new oncologist Dr. Barbaraann Cao earlier this week, her seizure  was felt to be unlikely related to 5-FU. She felt that seizure was coming after cycle 2 chemo when she coming in for pump d/c and required ativan. I will change her cehmo to CAPOX   Assessment and Plan     Metastatic small bowel cancer -On first-line chemotherapy FOLFOX -Due to recurrent seizure after 5-FU pump infusion, I will change FOLFOX to CapeOx.  I reviewed potential side effect from capecitabine, and management.  She agrees to try     Seizure -She had a seizure activity after first cycle of 5-FU pump infusion, and started Keppra -She has Ativan as needed     Plan -I will change FOLFOX to CapeOx from next cycle, will call in capecitabine to [facility] pharmacy today, plan to give 7 days on and 7 days off for the first 2 cycles.    SUMMARY OF ONCOLOGIC HISTORY: Oncology History  Cancer of ileum with carcinomatosis  07/10/2023 Initial Diagnosis   Cancer of ileum with carcinomatosis   07/22/2023 Cancer Staging   Staging form: Small Intestine - Adenocarcinoma, AJCC 8th Edition - Pathologic: Stage IV (pT4, pNX, pM1) - Signed by Malachy Mood, MD on 07/22/2023 Histologic grade (G): G2 Histologic grading system: 4 grade system   07/31/2023 -  Chemotherapy   Patient is on Treatment Plan : COLORECTAL FOLFOX q14d       Discussed the use of AI scribe software for clinical note transcription with the patient, who gave verbal consent to proceed.  History of Present Illness    Patient received first cycle FOLFOX (second dose of 5-FU pump infusion) last week.  When she returned on day 3 for pump DC, she reported dizziness, and slurred  speech, which was similar to the first episode of seizure.  She felt she was going to have seizure.  Will give her Ativan, she did not have full-blown seizure.  She went home with Ativan as needed, which she took twice since then, due to the similar episodes.  She otherwise tolerated chemo well, review of system negative.  MEDICAL HISTORY:  Past Medical  History:  Diagnosis Date   Anemia    Hypertension    Pre-diabetes 02/2022    SURGICAL HISTORY: Past Surgical History:  Procedure Laterality Date   BREAST LUMPECTOMY WITH RADIOACTIVE SEED LOCALIZATION Left 06/10/2022   Procedure: LEFT BREAST LUMPECTOMY WITH RADIOACTIVE SEED LOCALIZATION;  Surgeon: Griselda Miner, MD;  Location: [facility];  Service: General;  Laterality: Left;   CESAREAN SECTION     x4   IR IMAGING GUIDED PORT INSERTION  07/08/2023   LAPAROTOMY N/A 06/30/2023   Procedure: EXPLORATORY LAPAROTOMY, SMALL BOWEL RESECTION, AND EXCISIONAL BIOPSIES OF MULTIPLE OMENTAL MASSES;  Surgeon: Berna Bue, MD;  Location: [facility];  Service: General;  Laterality: N/A;   TUBAL LIGATION      I have reviewed the social history and family history with the patient and they are unchanged from previous note.  ALLERGIES:  has No Known Allergies.  MEDICATIONS:  Current Outpatient Medications  Medication Sig Dispense Refill   acetaminophen (TYLENOL) 500 MG tablet Take 2 tablets (1,000 mg total) by mouth every 8 (eight) hours as needed for mild pain or fever.     amoxicillin-clavulanate (AUGMENTIN) 875-125 MG tablet Take 1 tablet by mouth every 12 (twelve) hours for 7 days (Patient not taking: Reported on 08/18/2023) 14 tablet 0   Lacosamide 100 MG TABS Take 1 tablet (100 mg total) by mouth 2 (two) times daily. 120 tablet 0   levETIRAcetam (KEPPRA) 1000 MG tablet Take 1 tablet (1,000 mg total) by mouth 2 (two) times daily. 120 tablet 0   lidocaine-prilocaine (EMLA) cream Apply to affected area once 30 g 3   LORazepam (ATIVAN) 1 MG tablet Take 1 tablet (1 mg total) by mouth every 6 (six) hours as needed for seizure. 15 tablet 0   methocarbamol (ROBAXIN) 500 MG tablet Take 2 tablets (1,000 mg total) by mouth every 8 (eight) hours as needed for muscle spasms. 30 tablet 0   ondansetron (ZOFRAN) 8 MG tablet Take 1 tablet (8 mg total) by mouth every 8 (eight) hours as needed for  nausea or vomiting. Start on the third day after chemotherapy. 30 tablet 1   oxyCODONE (OXY IR/ROXICODONE) 5 MG immediate release tablet Take 1-2 tablets (5-10 mg total) by mouth every 6 (six) hours as needed for moderate pain or severe pain (5mg  moderate, 10mg  severe). (Patient taking differently: Take 5 mg by mouth every 6 (six) hours as needed for moderate pain (pain score 4-6) or severe pain (pain score 7-10).) 60 tablet 0   pantoprazole (PROTONIX) 40 MG tablet Take 1 tablet (40 mg total) by mouth 2 (two) times daily. 60 tablet 1   polyethylene glycol powder (GLYCOLAX/MIRALAX) 17 GM/SCOOP powder Take 17 g by mouth daily mixed in liquid as directed. (Patient not taking: Reported on 08/18/2023) 238 g 0   prochlorperazine (COMPAZINE) 10 MG tablet Take 1 tablet (10 mg total) by mouth every 6 (six) hours as needed for nausea or vomiting. (Patient not taking: Reported on 08/18/2023) 30 tablet 1   simethicone (MYLICON) 80 MG chewable tablet Chew 1 tablet (80 mg total) by mouth 4 (four) times  daily as needed for flatulence. (Patient not taking: Reported on 08/18/2023) 100 tablet 0   No current facility-administered medications for this visit.    PHYSICAL EXAMINATION: Not performed   LABORATORY DATA:  I have reviewed the data as listed    Latest Ref Rng & Units 08/14/2023    8:29 AM 08/07/2023    9:27 AM 08/03/2023    6:14 AM  CBC  WBC 4.0 - 10.5 K/uL 6.0  5.2  4.6   Hemoglobin 12.0 - 15.0 g/dL 91.4  78.2  95.6   Hematocrit 36.0 - 46.0 % 34.5  39.6  36.4   Platelets 150 - 400 K/uL 353  294  220         Latest Ref Rng & Units 08/14/2023    8:29 AM 08/03/2023    6:20 AM 08/03/2023    6:14 AM  CMP  Glucose 70 - 99 mg/dL 95   213   BUN 6 - 20 mg/dL 5   5   Creatinine 0.86 - 1.00 mg/dL 5.78   4.69   Sodium 629 - 145 mmol/L 140   137   Potassium 3.5 - 5.1 mmol/L 3.5   3.7   Chloride 98 - 111 mmol/L 105   102   CO2 22 - 32 mmol/L 28   21   Calcium 8.9 - 10.3 mg/dL 8.9   8.9   Total  Protein 6.5 - 8.1 g/dL 7.7  7.2    Total Bilirubin 0.3 - 1.2 mg/dL 0.3  0.7    Alkaline Phos 38 - 126 U/L 92  81    AST 15 - 41 U/L 15  23    ALT 0 - 44 U/L 13  15        RADIOGRAPHIC STUDIES: I have personally reviewed the radiological images as listed and agreed with the findings in the report. No results found.     I discussed the assessment and treatment plan with the patient. The patient was provided an opportunity to ask questions and all were answered. The patient agreed with the plan and demonstrated an understanding of the instructions.   The patient was advised to call back or seek an in-person evaluation if the symptoms worsen or if the condition fails to improve as anticipated.  I provided 21 minutes of non face-to-face telephone visit time during this encounter, and > 50% was spent counseling as documented under my assessment & plan.     Malachy Mood, MD 08/20/23

## 2023-08-20 NOTE — Assessment & Plan Note (Signed)
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  [name] in the liver MRI also showed a 1.8 cm oligo met in segment 7 of liver. -We discussed the incurable nature of her metastatic disease, due to her metastasis in peritoneum and liver.  We also discussed the small possibility of HIPEC and liver directed therapy if she has excellent response to chemotherapy. -I recommend first-line chemotherapy FOLFOX.  She started on July 31, 2023, oxaliplatin was held for first cycle due to open wound. -She developed seizure activity after first cycle of 5-FU infusion. She was evaluated by new oncologist Dr. Barbaraann Cao earlier this week, her seizure was felt to be unlikely related to 5-FU. She felt that seizure was coming after cycle 2 chemo when she coming in for pump d/c and required ativan. I will change her cehmo to CAPOX

## 2023-08-21 ENCOUNTER — Telehealth: Payer: Self-pay | Admitting: Pharmacy Technician

## 2023-08-21 ENCOUNTER — Telehealth: Payer: Self-pay | Admitting: Pharmacist

## 2023-08-21 ENCOUNTER — Other Ambulatory Visit (HOSPITAL_COMMUNITY): Payer: Self-pay

## 2023-08-21 DIAGNOSIS — C172 Malignant neoplasm of ileum: Secondary | ICD-10-CM

## 2023-08-21 MED ORDER — CAPECITABINE 500 MG PO TABS: 850.0000 mg/m2 | ORAL_TABLET | Freq: Two times a day (BID) | ORAL | 0 refills | Status: DC

## 2023-08-21 NOTE — Telephone Encounter (Signed)
Oral Oncology Patient Advocate Encounter   Received notification that prior authorization for capecitabine is required.   PA submitted on 08/21/23 Key [id number] Status is pending     Jinger Neighbors, CPhT-Adv Oncology Pharmacy Patient Advocate [facility] Direct Number: [phone]  Fax: [phone]

## 2023-08-21 NOTE — Telephone Encounter (Signed)
Oral Oncology Pharmacist Encounter  Received new prescription for Xeloda (capecitabine) for the treatment of metastatic cancer of the ileum in conjunction with oxaliplatin, planned duration until disease progression or unacceptable drug toxicity (patient switching from FOLFOX - last cycle of FOLFOX   CBC w/ Diff and CMP from 08/14/23 assessed, no relevant lab abnormalities requiring baseline dose adjustment required at this time. Prescription dose and frequency assessed for appropriateness.  Current medication list in Epic reviewed, DDIs with Xeloda identified: Category C DDI between Xeloda and Pantoprazole - proton-pump inhibitors can decrease efficacy of Xeloda - will discuss with patient alternatives to pantoprazole, such as H2RA's like famotidine while on Xeloda.  Evaluated chart and no patient barriers to medication adherence noted.   Patient agreement for treatment documented in MD note on 08/20/23.  Patient is required to fill through [facility] - prescription has been redirected for dispensing.   Oral Oncology Clinic will continue to follow for insurance authorization, copayment issues, initial counseling and start date.  Lenord Carbo, PharmD, BCPS, [facility] Hematology/Oncology Clinical Pharmacist [name] Oral Chemotherapy Navigation Clinics [phone] 08/21/2023 8:37 AM

## 2023-08-21 NOTE — Telephone Encounter (Signed)
Oral Oncology Patient Advocate Encounter  Prior Authorization for capecitabine has been approved.    PA# [id number] Effective dates: 08/21/23 through 08/20/24  Patient must fill at [facility].    Jinger Neighbors, CPhT-Adv Oncology Pharmacy Patient Advocate [facility] Direct Number: [phone]  Fax: [phone]

## 2023-08-22 ENCOUNTER — Telehealth: Payer: Self-pay

## 2023-08-22 ENCOUNTER — Other Ambulatory Visit: Payer: Self-pay

## 2023-08-22 ENCOUNTER — Telehealth: Payer: Self-pay | Admitting: *Deleted

## 2023-08-22 ENCOUNTER — Telehealth: Payer: Self-pay | Admitting: Internal Medicine

## 2023-08-22 NOTE — Telephone Encounter (Signed)
08/19/2023: [employer], [employer] FMLA form completed to collaborative pick-up bin for provider review, signature and return. Received today.  Successfully faxed to [employer] attn [place] [phone]).  Copy to [facility] H.I.M. bin for items to be scanned.  Copy to [facility] file folder for MALONIE TATUM to pick up and sign authorization on next scheduled appointment 08/26/2023.  No further instructions received or actions performed by this nurse.  Connected with Catalina Lunger. [phone] (home) advising of above.  Provided 08/26/2023 appointment at 10:00 am with Neuro-Oncology.  Currently no further questions or needs.

## 2023-08-22 NOTE — Telephone Encounter (Signed)
Attempted to inform patient that APS documents from [facility] had been completed and faxed back to the company. Fax confirmation received.

## 2023-08-22 NOTE — Telephone Encounter (Signed)
 Left patient a vm regarding upcoming appointment

## 2023-08-22 NOTE — Patient Outreach (Signed)
Care Management  Transitions of Care Program Transitions of Care Post-discharge Completion of 30 day Program    08/22/2023 Name: Linda [name] MRN: [id number] DOB: 1965-12-25  Subjective: Linda [name] is a 57 y.o. year old female who is a primary care patient of Arnette Felts, [name]. The Care Management team Engaged with patient Engaged with patient by telephone to assess and address transitions of care needs.   Consent to Services:  Patient was given information about care management services, agreed to services, and gave verbal consent to participate.   Assessment:   Patient voices no new complaints Patient has not developed/ reported any new Medical issues / Dx or acute changes.- since last follow-up call . She has had no additional seizures. She was seem 10/31 for a surgical follow-up/ She will begins a new anti-convulsant medication, she could not recall the name but stated it would be for 7 days. She stated she feels pretty well, no adverse side effects to speak of, and feels like she I has more energy.  Patient educated on red flags s/s to watch for and was encouraged to report any of these identified , any new symptoms , changes in baseline or  medication regimen,  change in health status  /  well-being, or safety concerns to PCP and / or Oncology / Specialists .          SDOH Interventions    Flowsheet Row Telephone from 07/24/2023 in [place] POPULATION HEALTH DEPARTMENT Telephone from 07/17/2023 in [place] POPULATION HEALTH DEPARTMENT  SDOH Interventions    Food Insecurity Interventions Intervention Not Indicated --  Housing Interventions -- Intervention Not Indicated  Transportation Interventions -- Intervention Not Indicated  Utilities Interventions Intervention Not Indicated --  Physical Activity Interventions Other (Comments)  [limited activity, starting Chemo next week] --        Goals Addressed             This Visit's Progress    COMPLETED: TOC  Care Plan       Current Barriers:  Care Coordination needs related to multiple changes in medications and new abdominal wound- healing well no s/s infection    RNCM Clinical Goal(s):  Patient will  through collaboration with RN Care manager, provider, and care team( PCP, Oncology, Neurology).  demonstrate ongoing self health care management ability with medication management ,symptom control and no infections r/t abdominal wound   through collaboration with RN Care manager, provider, and care team.Has completed  last Chemo Treatment - she did not have a seizure  she is on a 2 day on  2 weeks off cycle # of treatment unknown-Had reaction Oncology is working on new TX regimen- plan is for 14 day oral cycle . She will bridge over to a oral anti-convulsant      Interventions: Evaluation of current treatment plan related to  self management and patient's adherence to plan as established by provider     (Status:  Goal on track:  Yes.minor set back, is working with neurology and Oncology for a concurrent TX plan )  Short Term Goal Evaluation of current treatment plan related to  wound care   self-management and patient's adherence to plan as established by provider. Discussed plans with patient for ongoing care management follow up and provided patient with direct contact information for care management team Provided education to patient re: supplies need and wound care r/t dressing changes s/s of infections and when to call CM and or Provider Reviewed  scheduled/upcoming provider appointments including multiple specialist appts ( Oncology, Surgical f/u) Discussed plans with patient for ongoing care management follow up and provided patient with direct contact information for care management team Advised patient to discuss increased nausea and poor sleep with provider  Pain Interventions:  (Status:  Goal on track:  Yes.) Short Term Goal Pain assessment performed Medications reviewed Reviewed provider  established plan for pain management Discussed importance of adherence to all scheduled medical appointments Counseled on the importance of reporting any/all new or changed pain symptoms or management strategies to pain management provider Reviewed with patient prescribed pharmacological and nonpharmacological pain relief strategies  Patient Goals/Self-Care Activities: Participate in Transition of Care Program/Attend TOC scheduled calls Take all medications as prescribed Attend all scheduled provider appointments Call pharmacy for medication refills 3-7 days in advance of running out of medications Call provider office for new concerns or questions   Follow Up Plan:  The patient has been provided with contact information for the care management team and has been advised to call with any health related questions or concerns.  No further follow up required: She has completed her 30 day Program          Plan: The patient has been provided with contact information for the care management team and has been advised to call with any health related questions or concerns.  She has completed the 30 Day TOC Program and does not feel she needs additional follow-up at this time.    Susa Loffler , BSN, RN Care Management Coordinator [place]   [facility] [email or web address] Direct Dial: [phone]

## 2023-08-25 ENCOUNTER — Encounter: Payer: Self-pay | Admitting: Hematology

## 2023-08-26 ENCOUNTER — Inpatient Hospital Stay: Payer: BC Managed Care – PPO | Attending: Hematology | Admitting: Internal Medicine

## 2023-08-26 ENCOUNTER — Encounter: Payer: Self-pay | Admitting: Nurse Practitioner

## 2023-08-26 ENCOUNTER — Other Ambulatory Visit: Payer: Self-pay | Admitting: Nurse Practitioner

## 2023-08-26 VITALS — BP 140/74 | HR 68 | Temp 97.7°F | Resp 20 | Wt 180.3 lb

## 2023-08-26 DIAGNOSIS — C787 Secondary malignant neoplasm of liver and intrahepatic bile duct: Secondary | ICD-10-CM | POA: Insufficient documentation

## 2023-08-26 DIAGNOSIS — C179 Malignant neoplasm of small intestine, unspecified: Secondary | ICD-10-CM | POA: Insufficient documentation

## 2023-08-26 DIAGNOSIS — T148XXA Other injury of unspecified body region, initial encounter: Secondary | ICD-10-CM | POA: Insufficient documentation

## 2023-08-26 DIAGNOSIS — R569 Unspecified convulsions: Secondary | ICD-10-CM | POA: Diagnosis not present

## 2023-08-26 DIAGNOSIS — C786 Secondary malignant neoplasm of retroperitoneum and peritoneum: Secondary | ICD-10-CM | POA: Diagnosis not present

## 2023-08-26 DIAGNOSIS — Z79899 Other long term (current) drug therapy: Secondary | ICD-10-CM | POA: Diagnosis not present

## 2023-08-26 DIAGNOSIS — Z5111 Encounter for antineoplastic chemotherapy: Secondary | ICD-10-CM | POA: Diagnosis not present

## 2023-08-26 DIAGNOSIS — Z2821 Immunization not carried out because of patient refusal: Secondary | ICD-10-CM | POA: Insufficient documentation

## 2023-08-26 NOTE — Assessment & Plan Note (Signed)
Midline surgical wound is mostly devitalized except for the proximal end has a small opening.

## 2023-08-26 NOTE — Progress Notes (Signed)
[facility] at [facility] [address]  [place], [place] [address] [phone]   Interval Evaluation  Date of Service: 08/26/23 Patient Name: Linda [name] Patient MRN: [id number] Patient DOB: 05-Feb-1966 Provider: Henreitta Leber, MD  Identifying Statement:  [name] is a 57 y.o. female with Seizures (HCC)   Primary Cancer:  Oncologic History: Oncology History  Cancer of ileum with carcinomatosis  07/10/2023 Initial Diagnosis   Cancer of ileum with carcinomatosis   07/22/2023 Cancer Staging   Staging form: Small Intestine - Adenocarcinoma, AJCC 8th Edition - Pathologic: Stage IV (pT4, pNX, pM1) - Signed by Malachy Mood, MD on 07/22/2023 Histologic grade (G): G2 Histologic grading system: 4 grade system   07/31/2023 - 08/16/2023 Chemotherapy   Patient is on Treatment Plan : COLORECTAL FOLFOX q14d     08/28/2023 -  Chemotherapy   Patient is on Treatment Plan : COLORECTAL CAPEOX (130/850) q21d x 8 cycles       Interval History: ANNALYSSE [name] presents today for follow up after recurrent seizure episode.  This episode occurred on Saturday 10/26, as pump for 5-Fu was being removed here at the cancer center.  It consisted of confusion, amnesia, speech impairment.  Ativan was administered which made her sleepy, she returned to normal by the next day.  No recurrence of events, 5-Fu has since been discontinued as part of her treatment plan.  Continues on Keppra and Vimpat without any reported side effects.  H+P (08/12/23) Patient presents to review recent neurologic episodes.  She and her daughter describe sudden onset right facial twitching, speech impairment, followed by twitching of all limbs, amnesia.  This was followed by confusion, lasting for almost a day.  Seizure episode occurred ~2 days after infusion of 5-Fu for peritoneal carcinomatosis.  The event recurred following admission to the hospital, similar semiology.  Patient has only faint  recollection of each event.  She underwent brain MRI, long term EEG, lumbar puncture, was started on both Keppra and Vimpat. Since admission, she reports feeling at her baseline with no breakthrough seizures.  Positive family history of seizures in both mother and brother.  No other notable epilepsy risk factors     Medications: Current Outpatient Medications on File Prior to Visit  Medication Sig Dispense Refill   acetaminophen (TYLENOL) 500 MG tablet Take 2 tablets (1,000 mg total) by mouth every 8 (eight) hours as needed for mild pain or fever.     capecitabine (XELODA) 500 MG tablet Take 3 tablets (1,500 mg total) by mouth 2 (two) times daily after a meal. Take within 30 minutes after meal. Take for 7 days on, then 7 days off, repeat every 14 days. 84 tablet 0   Lacosamide 100 MG TABS Take 1 tablet (100 mg total) by mouth 2 (two) times daily. 120 tablet 0   levETIRAcetam (KEPPRA) 1000 MG tablet Take 1 tablet (1,000 mg total) by mouth 2 (two) times daily. 120 tablet 0   LORazepam (ATIVAN) 1 MG tablet Take 1 tablet (1 mg total) by mouth every 6 (six) hours as needed for seizure. 15 tablet 0   methocarbamol (ROBAXIN) 500 MG tablet Take 2 tablets (1,000 mg total) by mouth every 8 (eight) hours as needed for muscle spasms. 30 tablet 0   oxyCODONE (OXY IR/ROXICODONE) 5 MG immediate release tablet Take 1-2 tablets (5-10 mg total) by mouth every 6 (six) hours as needed for moderate pain or severe pain (5mg  moderate, 10mg  severe). (Patient taking differently:  Take 5 mg by mouth every 6 (six) hours as needed for moderate pain (pain score 4-6) or severe pain (pain score 7-10).) 60 tablet 0   pantoprazole (PROTONIX) 40 MG tablet Take 1 tablet (40 mg total) by mouth 2 (two) times daily. 60 tablet 1   polyethylene glycol powder (GLYCOLAX/MIRALAX) 17 GM/SCOOP powder Take 17 g by mouth daily mixed in liquid as directed. 238 g 0   simethicone (MYLICON) 80 MG chewable tablet Chew 1 tablet (80 mg total) by mouth 4  (four) times daily as needed for flatulence. 100 tablet 0   amoxicillin-clavulanate (AUGMENTIN) 875-125 MG tablet Take 1 tablet by mouth every 12 (twelve) hours for 7 days 14 tablet 0   No current facility-administered medications on file prior to visit.    Allergies: No Known Allergies Past Medical History:  Past Medical History:  Diagnosis Date   Abnormal uterine bleeding 04/10/2022   Anemia    Hypertension    Pre-diabetes 02/2022   Past Surgical History:  Past Surgical History:  Procedure Laterality Date   BREAST LUMPECTOMY WITH RADIOACTIVE SEED LOCALIZATION Left 06/10/2022   Procedure: LEFT BREAST LUMPECTOMY WITH RADIOACTIVE SEED LOCALIZATION;  Surgeon: Griselda Miner, MD;  Location: [facility];  Service: General;  Laterality: Left;   CESAREAN SECTION     x4   IR IMAGING GUIDED PORT INSERTION  07/08/2023   LAPAROTOMY N/A 06/30/2023   Procedure: EXPLORATORY LAPAROTOMY, SMALL BOWEL RESECTION, AND EXCISIONAL BIOPSIES OF MULTIPLE OMENTAL MASSES;  Surgeon: Berna Bue, MD;  Location: [facility];  Service: General;  Laterality: N/A;   TUBAL LIGATION     Social History:  Social History   Socioeconomic History   Marital status: Single    Spouse name: Not on file   Number of children: 4   Years of education: Not on file   Highest education level: Some college, no degree  Occupational History    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  Tobacco Use   Smoking status: Never   Smokeless tobacco: Never  Vaping Use   Vaping status: Never Used  Substance and Sexual Activity   Alcohol use: No   Drug use: No   Sexual activity: Not Currently    Birth control/protection: Surgical  Other Topics Concern   Not on file  Social History Narrative   Not on file   Social Determinants of Health   Financial Resource Strain: Low Risk  (08/07/2023)   Overall Financial Resource Strain (CARDIA)    Difficulty of Paying Living Expenses: Not hard at all  Recent Concern:  Financial Resource Strain - Medium Risk (07/30/2023)   Overall Financial Resource Strain (CARDIA)    Difficulty of Paying Living Expenses: Somewhat hard  Food Insecurity: No Food Insecurity (08/07/2023)   Hunger Vital Sign    Worried About Running Out of Food in the Last Year: Never true    Ran Out of Food in the Last Year: Never true  Transportation Needs: No Transportation Needs (08/07/2023)   PRAPARE - Administrator, Civil Service (Medical): No    Lack of Transportation (Non-Medical): No  Physical Activity: Inactive (08/07/2023)   Exercise Vital Sign    Days of Exercise per Week: 0 days    Minutes of Exercise per Session: 0 min  Stress: No Stress Concern Present (08/07/2023)   [employer] of Occupational Health - Occupational Stress Questionnaire    Feeling of Stress : Only a little  Social Connections: Moderately Isolated (08/07/2023)   Social  Connection and Isolation Panel [NHANES]    Frequency of Communication with Friends and Family: More than three times a week    Frequency of Social Gatherings with Friends and Family: More than three times a week    Attends Religious Services: More than 4 times per year    Active Member of [employer] or Organizations: No    Attends Engineer, structural: Not on file    Marital Status: Never married  Intimate Partner Violence: Not At Risk (06/20/2023)   Humiliation, Afraid, Rape, and Kick questionnaire    Fear of Current or Ex-Partner: No    Emotionally Abused: No    Physically Abused: No    Sexually Abused: No   Family History:  Family History  Problem Relation Age of Onset   Memory loss Mother    Hypertension Father    Hypertension Brother    Diabetes Maternal Grandmother    Hypertension Maternal Grandmother    Hypertension Maternal Grandfather    Colon cancer Neg Hx    Stomach cancer Neg Hx    Esophageal cancer Neg Hx     Review of Systems: Constitutional: Doesn't report fevers, chills or abnormal weight  loss Eyes: Doesn't report blurriness of vision Ears, nose, mouth, throat, and face: Doesn't report sore throat Respiratory: Doesn't report cough, dyspnea or wheezes Cardiovascular: Doesn't report palpitation, chest discomfort  Gastrointestinal:  Doesn't report nausea, constipation, diarrhea GU: Doesn't report incontinence Skin: Doesn't report skin rashes Neurological: Per HPI Musculoskeletal: Doesn't report joint pain Behavioral/Psych: Doesn't report anxiety  Physical Exam: Vitals:   08/26/23 1034  BP: (!) 140/74  Pulse: 68  Resp: 20  Temp: 97.7 F (36.5 C)  SpO2: 100%   KPS: 90. General: Alert, cooperative, pleasant, in no acute distress Head: Normal EENT: No conjunctival injection or scleral icterus.  Lungs: Resp effort normal Cardiac: Regular rate Abdomen: Non-distended abdomen Skin: No rashes cyanosis or petechiae. Extremities: No clubbing or edema  Neurologic Exam: Mental Status: Awake, alert, attentive to examiner. Oriented to self and environment. Language is fluent with intact comprehension.  Cranial Nerves: Visual acuity is grossly normal. Visual fields are full. Extra-ocular movements intact. No ptosis. Face is symmetric Motor: Tone and bulk are normal. Power is full in both arms and legs. Reflexes are symmetric, no pathologic reflexes present.  Sensory: Intact to light touch Gait: Normal.   Labs: I have reviewed the data as listed    Component Value Date/Time   NA 140 08/14/2023 0829   NA 141 05/16/2022 1044   K 3.5 08/14/2023 0829   CL 105 08/14/2023 0829   CO2 28 08/14/2023 0829   GLUCOSE 95 08/14/2023 0829   BUN 5 (L) 08/14/2023 0829   BUN 9 05/16/2022 1044   CREATININE 0.68 08/14/2023 0829   CALCIUM 8.9 08/14/2023 0829   PROT 7.7 08/14/2023 0829   PROT 7.8 05/16/2022 1044   ALBUMIN 3.5 08/14/2023 0829   ALBUMIN 3.5 (L) 08/03/2023 1515   AST 15 08/14/2023 0829   ALT 13 08/14/2023 0829   ALKPHOS 92 08/14/2023 0829   BILITOT 0.3 08/14/2023 0829    GFRNONAA >60 08/14/2023 0829   Lab Results  Component Value Date   WBC 6.0 08/14/2023   NEUTROABS 3.8 08/14/2023   HGB 10.5 (L) 08/14/2023   HCT 34.5 (L) 08/14/2023   MCV 85.2 08/14/2023   PLT 353 08/14/2023    Imaging:  Overnight EEG with video  Result Date: 08/03/2023 Charlsie Quest, MD     08/04/2023  8:56  AM Patient Name: JILLIANA BURKES MRN: [id number] Epilepsy Attending: Charlsie Quest Referring Physician/Provider: Gordy Councilman, MD Duration: 08/03/2023 4098 to 08/04/2023 1191 Patient history:  57 yo F with sudden onset of clamminess and sweatiness at 2 PM with right eye and right arm twitching. Arm twitching resolved but right perioral twitching never completely resolved. EEG to evaluate for seizure Level of alertness: Awake, asleep AEDs during EEG study: LEV, LCM Technical aspects: This EEG study was done with scalp electrodes positioned according to the 10-20 International system of electrode placement. Electrical activity was reviewed with band pass filter of 1-70Hz , sensitivity of 7 uV/mm, display speed of 72mm/sec with a 60Hz  notched filter applied as appropriate. EEG data were recorded continuously and digitally stored.  Video monitoring was available and reviewed as appropriate. Description: The posterior dominant rhythm consists of 8 Hz activity of moderate voltage (25-35 uV) seen predominantly in posterior head regions, symmetric and reactive to eye opening and eye closing. Sleep was characterized by vertex waves, sleep spindles (12 to 14 Hz), maximal frontocentral region. EEG showed intermittent generalized polymorphic sharply contoured high amplitude 3 to 6 Hz theta-delta slowing. Hyperventilation and photic stimulation were not performed.   ABNORMALITY - Intermittent slow, generalized IMPRESSION: This study is suggestive of mild diffuse encephalopathy. No seizures or epileptiform discharges were seen throughout the recording. Please note that lack of epileptiform  activity during interictal EEG does not exclude the diagnosis of epilepsy.  Charlsie Quest   MR Brain W and Wo Contrast  Result Date: 08/02/2023 CLINICAL DATA:  Initial evaluation for neuro deficit, stroke suspected. EXAM: MRI HEAD WITHOUT AND WITH CONTRAST TECHNIQUE: Multiplanar, multiecho pulse sequences of the brain and surrounding structures were obtained without and with intravenous contrast. CONTRAST:  8mL GADAVIST GADOBUTROL 1 MMOL/ML IV SOLN COMPARISON:  None available. FINDINGS: Brain: Cerebral volume within normal limits. Few punctate foci of T2/FLAIR hyperintensity noted within the subcortical white matter of the anterior right frontal lobe, nonspecific, but minimal in nature, less than is typically seen for patient age. 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 No significant associated T2/FLAIR signal or enhancement. No other evidence for acute or subacute ischemia. No other acute or chronic intracranial blood products. No mass lesion, midline shift or mass effect. No hydrocephalus or extra-axial fluid collection. Empty sella noted. No abnormal enhancement or evidence for intracranial metastatic disease. Vascular: Major intracranial vascular flow voids are well maintained. Skull and upper cervical spine: Craniocervical junction within normal limits. Decreased T1 signal intensity seen throughout the visualized bone marrow, nonspecific, but most commonly related to anemia, smoking or obesity. No visible focal marrow replacing lesion. No scalp soft tissue abnormality. Sinuses/Orbits: Globes and orbital soft tissues within normal limits. Paranasal sinuses are clear. No mastoid effusion. Other: None. IMPRESSION: 1. 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 Differential  considerations for this finding are broad, including changes related to seizure, toxic metabolic derangement, demyelination, or less likely ischemia. 2. No other acute intracranial abnormality. No evidence for intracranial metastatic disease. 3. Empty sella. Electronically Signed   By: Rise Mu M.D.   On: 08/02/2023 20:10    [facility] Clinician Interpretation: I have personally reviewed the radiological images as listed.  My interpretation, in the context of the patient's clinical presentation, is  seizure artifact,  no T2 correlate   Assessment/Plan Seizures (HCC)  Catalina Lunger presents with clinical syndrome c/w novel seizures.  Suspect these are focal based on semiology, starting with right facial twitching and speech arrest, but EEG and MRI were non-localizing.  Etiology of seizures is unclear, family history is noteworthy.  Given recurrence of episode again following 5-Fu administration, we agree with discontinuing this component of the treatment plan.  She is now on xeloda and oxaliplatin.    She is otherwise doing very well on Keppra 1000mg  BID and Vimpat 100mg  BID.  We will recommend she continue these doses for now, we will discuss withdrawing one of the drugs if she maintains seizure freedom for 2-3 months in absence of provoking infusion.  We appreciate the opportunity to participate in the care of [employer].  We ask that Catalina Lunger return to clinic in 2 months, or sooner as needed.   All questions were answered. The patient knows to call the clinic with any problems, questions or concerns. No barriers to learning were detected.  The total time spent in the encounter was 30 minutes and more than 50% was on counseling and review of test results   Henreitta Leber, MD Medical Director of Neuro-Oncology [facility] 08/26/23 11:03 AM

## 2023-08-26 NOTE — Assessment & Plan Note (Signed)

## 2023-08-26 NOTE — Assessment & Plan Note (Addendum)
This is a new diagnosis, she has not had her first treatment but has a portacath. Overall she is in good spirits. During her hospitalization  TCM Performed. A member of the clinical team spoke with the patient upon dischare. Discharge summary was reviewed in full detail during the visit. Meds reconciled and compared to discharge meds. Medication list is updated and reviewed with the patient.  Greater than 50% face to face time was spent in counseling an coordination of care.  All questions were answered to the satisfaction of the patient.

## 2023-08-26 NOTE — Assessment & Plan Note (Signed)

## 2023-08-26 NOTE — Assessment & Plan Note (Signed)
At this time will hold on her Ozempic. Continue focusing on healthy diet and regular diet

## 2023-08-26 NOTE — Assessment & Plan Note (Signed)
Declines shingrix, educated on disease process and is aware if he changes his mind to notify office  

## 2023-08-27 ENCOUNTER — Other Ambulatory Visit: Payer: Self-pay

## 2023-08-27 ENCOUNTER — Other Ambulatory Visit: Payer: Self-pay | Admitting: Pharmacy Technician

## 2023-08-27 ENCOUNTER — Other Ambulatory Visit (HOSPITAL_COMMUNITY): Payer: Self-pay

## 2023-08-27 ENCOUNTER — Other Ambulatory Visit: Payer: Self-pay | Admitting: Hematology

## 2023-08-27 ENCOUNTER — Encounter: Payer: Self-pay | Admitting: Hematology

## 2023-08-27 DIAGNOSIS — C172 Malignant neoplasm of ileum: Secondary | ICD-10-CM

## 2023-08-27 MED ORDER — DICLOFENAC SODIUM 1 % EX GEL
CUTANEOUS | 0 refills | Status: AC
  Filled 2023-08-27 – 2023-09-11 (×3): qty 400, 84d supply, fill #0

## 2023-08-27 MED ORDER — CAPECITABINE 500 MG PO TABS
850.0000 mg/m2 | ORAL_TABLET | Freq: Two times a day (BID) | ORAL | 0 refills | Status: DC
  Filled 2023-08-27: qty 84, 28d supply, fill #0

## 2023-08-27 NOTE — Telephone Encounter (Signed)
Oral Chemotherapy Pharmacist Encounter  I spoke with patient for overview of: Xeloda (capecitabine) for the treatment of metastatic cancer of the ileum in conjunction with oxaliplatin, planned duration until disease progression or unacceptable drug toxicity.  Counseled patient on administration, dosing, side effects, monitoring, drug-food interactions, safe handling, storage, and disposal.  Patient will take Xeloda 500mg  tablets, 3 tablets (1500mg ) by mouth in AM and 3 tabs (1500mg ) by mouth in PM, within 30 minutes of finishing meals, on days 1-14 of each 21 day cycle.   Xeloda start date: 08/28/23  Adverse effects include but are not limited to: fatigue, decreased blood counts, GI upset, diarrhea, mouth sores, and hand-foot syndrome. Hand-foot syndrome: discussed use of cream such as [place] Smooth Extra Care 20 or equivalent advanced care cream that has 20% urea content for advanced skin hydration while on Xeloda Diarrhea: Patient will obtain Imodium (loperamide) to have on hand if they experience diarrhea. Patient knows to alert the office of 4 or more loose stools above baseline.  Patient offered referral to diclofenac prophylaxis hand-foot syndrome research project. Patient agreed to referral.   Reviewed with patient importance of keeping a medication schedule and plan for any missed doses. No barriers to medication adherence identified.  Medication reconciliation performed and medication/allergy list updated. Patient states she is no longer taking pantoprazole - I have updated her medication list. She knows if she has acid reflux while on Xeloda it's OK to take Tums or Pepcid PRN.  Patient has high copay for Xeloda under insurance - patient has elected to fill through cash price at [facility].  All questions answered.  Linda [name] voiced understanding and appreciation.   Medication education handout and medication calendar will be given to patient in clinic on  08/28/23. Patient knows to call the office with questions or concerns. Oral Chemotherapy Clinic phone number provided to patient.   Lenord Carbo, PharmD, BCPS, BCOP Hematology/Oncology Clinical Pharmacist [name] Oral Chemotherapy Navigation Clinics [phone] 08/27/2023 11:19 AM

## 2023-08-27 NOTE — Progress Notes (Signed)
Oral Chemotherapy Pharmacist Encounter  Patient was counseled under telephone encounter from 08/21/23.  Lenord Carbo, PharmD, BCPS, BCOP Hematology/Oncology Clinical Pharmacist [name] Oral Chemotherapy Navigation Clinics [phone] 08/27/2023 11:23 AM

## 2023-08-27 NOTE — Progress Notes (Signed)
Research Note - Consent Authorization   Study Name: Evaluation of Topical Diclofenac Use on the Incidence and Severity of Hand Foot Syndrome in Patients with Breast and Gastrointestinal Malignancies Taking Capecitabine   IRB# [id number]   A summary of the study was presented to the patient, including potential risks and benefits from study participation, alternatives treatment options available other than study participation, how their confidentiality will be maintained, and who to contact if they had questions regarding their rights as a study participant or if they experience an injury associated with their participation. They were told that participation is completely voluntary and choosing not to participate would not impact care they would otherwise have access to. They were also told that even if they choose to participate, they can withdraw their consent to participate at any time in the future. Any questions the patient asked were addressed by a member of the research team.   After having all of their questions answered, the patient agreed to participate in the study by confirming their willingness to consent to participation verbally over the phone on 08/27/2023 at 12:49 PM.    Linda [name], PharmD PGY2 Oncology Pharmacy Resident  Principal Investigator  [phone]

## 2023-08-27 NOTE — Progress Notes (Signed)
Specialty Pharmacy Initial Fill Coordination Note  Linda [name] is a 57 y.o. female contacted today regarding refills of specialty medication(s) Capecitabine .  Patient requested [name] at [facility] at [place]  on 08/28/23   Medication will be filled on 08/27/23.   Patient is aware of $60 copayment.

## 2023-08-28 ENCOUNTER — Other Ambulatory Visit: Payer: Self-pay

## 2023-08-28 ENCOUNTER — Inpatient Hospital Stay: Payer: BC Managed Care – PPO | Attending: Hematology | Admitting: Hematology

## 2023-08-28 ENCOUNTER — Inpatient Hospital Stay: Payer: BC Managed Care – PPO | Admitting: Physician Assistant

## 2023-08-28 ENCOUNTER — Inpatient Hospital Stay: Payer: BC Managed Care – PPO

## 2023-08-28 ENCOUNTER — Other Ambulatory Visit (HOSPITAL_COMMUNITY): Payer: Self-pay

## 2023-08-28 ENCOUNTER — Inpatient Hospital Stay: Payer: BC Managed Care – PPO | Admitting: Nutrition

## 2023-08-28 VITALS — BP 168/86 | HR 92 | Resp 16

## 2023-08-28 VITALS — BP 152/82 | HR 75 | Temp 98.2°F | Resp 16 | Wt 183.4 lb

## 2023-08-28 DIAGNOSIS — R569 Unspecified convulsions: Secondary | ICD-10-CM

## 2023-08-28 DIAGNOSIS — Z79899 Other long term (current) drug therapy: Secondary | ICD-10-CM | POA: Diagnosis not present

## 2023-08-28 DIAGNOSIS — C179 Malignant neoplasm of small intestine, unspecified: Secondary | ICD-10-CM | POA: Diagnosis not present

## 2023-08-28 DIAGNOSIS — C172 Malignant neoplasm of ileum: Secondary | ICD-10-CM

## 2023-08-28 DIAGNOSIS — Z5111 Encounter for antineoplastic chemotherapy: Secondary | ICD-10-CM | POA: Diagnosis not present

## 2023-08-28 DIAGNOSIS — C786 Secondary malignant neoplasm of retroperitoneum and peritoneum: Secondary | ICD-10-CM | POA: Diagnosis not present

## 2023-08-28 DIAGNOSIS — C787 Secondary malignant neoplasm of liver and intrahepatic bile duct: Secondary | ICD-10-CM | POA: Diagnosis not present

## 2023-08-28 DIAGNOSIS — Z95828 Presence of other vascular implants and grafts: Secondary | ICD-10-CM

## 2023-08-28 LAB — CBC WITH DIFFERENTIAL (CANCER CENTER ONLY)
Abs Immature Granulocytes: 0.01 10*3/uL (ref 0.00–0.07)
Basophils Absolute: 0 10*3/uL (ref 0.0–0.1)
Basophils Relative: 1 %
Eosinophils Absolute: 0.2 10*3/uL (ref 0.0–0.5)
Eosinophils Relative: 3 %
HCT: 35.4 % — ABNORMAL LOW (ref 36.0–46.0)
Hemoglobin: 11 g/dL — ABNORMAL LOW (ref 12.0–15.0)
Immature Granulocytes: 0 %
Lymphocytes Relative: 37 %
Lymphs Abs: 1.9 10*3/uL (ref 0.7–4.0)
MCH: 26.4 pg (ref 26.0–34.0)
MCHC: 31.1 g/dL (ref 30.0–36.0)
MCV: 84.9 fL (ref 80.0–100.0)
Monocytes Absolute: 0.3 10*3/uL (ref 0.1–1.0)
Monocytes Relative: 7 %
Neutro Abs: 2.7 10*3/uL (ref 1.7–7.7)
Neutrophils Relative %: 52 %
Platelet Count: 304 10*3/uL (ref 150–400)
RBC: 4.17 MIL/uL (ref 3.87–5.11)
RDW: 20.4 % — ABNORMAL HIGH (ref 11.5–15.5)
WBC Count: 5.1 10*3/uL (ref 4.0–10.5)
nRBC: 0 % (ref 0.0–0.2)

## 2023-08-28 LAB — BASIC METABOLIC PANEL - CANCER CENTER ONLY
Anion gap: 8 (ref 5–15)
BUN: 5 mg/dL — ABNORMAL LOW (ref 6–20)
CO2: 26 mmol/L (ref 22–32)
Calcium: 9.2 mg/dL (ref 8.9–10.3)
Chloride: 106 mmol/L (ref 98–111)
Creatinine: 0.71 mg/dL (ref 0.44–1.00)
GFR, Estimated: >60 mL/min (ref >60)
Glucose, Bld: 144 mg/dL — ABNORMAL HIGH (ref 70–99)
Potassium: 3.4 mmol/L — ABNORMAL LOW (ref 3.5–5.1)
Sodium: 140 mmol/L (ref 135–145)

## 2023-08-28 LAB — PREGNANCY, URINE: Preg Test, Ur: NEGATIVE

## 2023-08-28 MED ORDER — LORAZEPAM 1 MG PO TABS
1.0000 mg | ORAL_TABLET | Freq: Four times a day (QID) | ORAL | 0 refills | Status: DC | PRN
  Filled 2023-08-28: qty 15, 4d supply, fill #0

## 2023-08-28 MED ORDER — HEPARIN SOD (PORK) LOCK FLUSH 100 UNIT/ML IV SOLN
500.0000 [IU] | Freq: Once | INTRAVENOUS | Status: AC | PRN
Start: 2023-08-28 — End: 2023-08-28
  Administered 2023-08-28: 500 [IU]

## 2023-08-28 MED ORDER — SODIUM CHLORIDE 0.9% FLUSH
10.0000 mL | Freq: Once | INTRAVENOUS | Status: AC
  Administered 2023-08-28: 10 mL

## 2023-08-28 MED ORDER — LORAZEPAM 2 MG/ML IJ SOLN
INTRAMUSCULAR | Status: AC
  Administered 2023-08-28: 1 mg via INTRAVENOUS
  Filled 2023-08-28: qty 1

## 2023-08-28 MED ORDER — SODIUM CHLORIDE 0.9% FLUSH
10.0000 mL | INTRAVENOUS | Status: DC | PRN
  Administered 2023-08-28: 10 mL

## 2023-08-28 MED ORDER — DEXAMETHASONE SODIUM PHOSPHATE 10 MG/ML IJ SOLN
10.0000 mg | Freq: Once | INTRAMUSCULAR | Status: AC
  Administered 2023-08-28: 10 mg via INTRAVENOUS
  Filled 2023-08-28: qty 1

## 2023-08-28 MED ORDER — OXALIPLATIN CHEMO INJECTION 100 MG/20ML
130.0000 mg/m2 | Freq: Once | INTRAVENOUS | Status: AC
  Administered 2023-08-28: 250 mg via INTRAVENOUS
  Filled 2023-08-28: qty 10

## 2023-08-28 MED ORDER — PALONOSETRON HCL INJECTION 0.25 MG/5ML
0.2500 mg | Freq: Once | INTRAVENOUS | Status: AC
  Administered 2023-08-28: 0.25 mg via INTRAVENOUS
  Filled 2023-08-28: qty 5

## 2023-08-28 MED ORDER — DEXTROSE 5 % IV SOLN
INTRAVENOUS | Status: DC
Start: 2023-08-28 — End: 2023-08-28

## 2023-08-28 MED ORDER — LORAZEPAM 2 MG/ML IJ SOLN
1.0000 mg | Freq: Once | INTRAMUSCULAR | Status: AC
  Administered 2023-08-28: 1 mg via INTRAVENOUS

## 2023-08-28 NOTE — Progress Notes (Deleted)
A total of 2mg  IV Ativan was wasted by Maxine Glenn and witnessed by [employer].

## 2023-08-28 NOTE — Progress Notes (Addendum)
At approximately 1408 this pt alerted this RN that she felt dizzy upon completion of Oxaliplatin tx. This RN immediately stopped the infusion and began running saline at 434ml/hr and called Benedict Needy.  The pt reported a history of seizures upon completion of chemotherapy, and took Keppra that morning. Benedict Needy ordered a total of 2mg  Ativan to be given IV. The first was given at 1417 by this RN The second was given by Lowella Bandy RN at approximately 1440. The remaining Ativan was wasted by Maxine Glenn and witnessed by this RN. Pt vitals remained stable throughout, and the pt began reporting that she was coming back to baseline at approximately 1530. The pt was observed until stable by this RN and Jannet Mantis.

## 2023-08-28 NOTE — Progress Notes (Signed)
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  [name] in the liver MRI also showed a 1.8 cm oligo met in segment 7 of liver. -We discussed the incurable nature of her metastatic disease, due to her metastasis in peritoneum and liver.  We also discussed the small possibility of HIPEC and liver directed therapy if she has excellent response to chemotherapy. -I recommend first-line chemotherapy FOLFOX.  She started on July 31, 2023, oxaliplatin was held for first cycle due to open wound. -She developed seizure activity after first cycle of 5-FU infusion. She was evaluated by new oncologist Dr. Barbaraann Cao earlier this week, her seizure was felt to be unlikely related to 5-FU. She felt that seizure was coming after cycle 2 chemo when she coming in for pump d/c and required ativan. I will change her chemo to CAPOX      Assessment and Plan    Metastatic Small Bowel Cancer Transitioning from biweekly to triweekly chemotherapy regimen with oxaliplatin and oral medication capecitabine. No current pain reported. Incision from previous procedure is healing well. -Start new oral medication after dinner today. -Expect increased fatigue, taste changes, possible nausea, and decreased appetite  due to increased oxaliplatin dose. -Next infusion scheduled for 09/17/2023. -Phone visit next week to check on patient's tolerance to new medication.  Seizure Disorder Recent seizure episode lasting 10-15 minutes. Currently on Ativan with one dose remaining. -Continue current seizure medication. -Start new medication tonight after dinner. -If feeling of impending seizure occurs, stop the oral chemotherapy medication and take Ativan if needed. -If Ativan refill needed, contact the office.  General Health Maintenance -Use warm water with salt and baking soda for mouth rinsing if sores develop. -Continue flushing protocol with toilet use due to chemotherapy. -Follow-up phone visit next week to check on patient's response to new medication.     Plan -Lab reviewed, adequate for treatment, will proceed for cycle CapeOx today, she will start capecitabine tonight -Phone visit in a week to check toxicity  Addendum -At the end of oxaliplatin infusion, patient developed aura for seizure again, and he received IV Ativan.  She recovered well, no full-blown of seizure activity.  Will call in oral Ativan prescription for her to use as needed at home.  Okay to postpone capecitabine for a few days when she recovers completely.  -Will reduce oxaliplatin dose to 85 mg/m on next cycle, which she previously tolerated well.      SUMMARY OF ONCOLOGIC HISTORY: Oncology History  Cancer of ileum with carcinomatosis  07/10/2023 Initial Diagnosis   Cancer of ileum with carcinomatosis   07/22/2023 Cancer Staging   Staging form: Small Intestine - Adenocarcinoma, AJCC 8th Edition - Pathologic: Stage IV (pT4, pNX, pM1) - Signed by Malachy Mood, MD on 07/22/2023 Histologic grade (G): G2 Histologic grading system: 4 grade system   07/31/2023 - 08/16/2023 Chemotherapy  Patient is on Treatment Plan : COLORECTAL FOLFOX q14d     08/28/2023 -  Chemotherapy   Patient is on Treatment Plan : COLORECTAL CAPEOX (130/850)  q21d x 8 cycles        Discussed the use of AI scribe software for clinical note transcription with the patient, who gave verbal consent to proceed.  History of Present Illness   A 57 year old patient with a history of metastatic small bowel cancer presents for a follow-up visit. The patient recently experienced a seizure episode lasting approximately 10-15 minutes. This was a concern as the patient is on seizure medication. The patient reports no further seizure-like symptoms since the episode. The patient also reports that her incision from a previous procedure is healing well and is mostly dry, with a small bandage covering the lower part. The patient denies experiencing any pain. The patient is due to start a new seizure medication, which she has already picked up from the pharmacy. The patient is also due to start a new tri-weekly regimen for her cancer treatment, which will involve a higher dose of oxaliplatin and a pill to be taken at home. The patient has been informed of the potential side effects of this new regimen, including cold sensitivity, fatigue, taste changes, possible nausea, and low appetite. The patient has also been provided with a cream to manage potential skin side effects.         All other systems were reviewed with the patient and are negative.  MEDICAL HISTORY:  Past Medical History:  Diagnosis Date   Abnormal uterine bleeding 04/10/2022   Anemia    Hypertension    Pre-diabetes 02/2022    SURGICAL HISTORY: Past Surgical History:  Procedure Laterality Date   BREAST LUMPECTOMY WITH RADIOACTIVE SEED LOCALIZATION Left 06/10/2022   Procedure: LEFT BREAST LUMPECTOMY WITH RADIOACTIVE SEED LOCALIZATION;  Surgeon: Griselda Miner, MD;  Location: [facility];  Service: General;  Laterality: Left;   CESAREAN SECTION     x4   IR IMAGING GUIDED PORT INSERTION  07/08/2023   LAPAROTOMY N/A 06/30/2023   Procedure: EXPLORATORY LAPAROTOMY, SMALL BOWEL RESECTION,  AND EXCISIONAL BIOPSIES OF MULTIPLE OMENTAL MASSES;  Surgeon: Berna Bue, MD;  Location: [facility];  Service: General;  Laterality: N/A;   TUBAL LIGATION      I have reviewed the social history and family history with the patient and they are unchanged from previous note.  ALLERGIES:  has No Known Allergies.  MEDICATIONS:  Current Outpatient Medications  Medication Sig Dispense Refill   acetaminophen (TYLENOL) 500 MG tablet Take 2 tablets (1,000 mg total) by mouth every 8 (eight) hours as needed for mild pain or fever.     amoxicillin-clavulanate (AUGMENTIN) 875-125 MG tablet Take 1 tablet by mouth every 12 (twelve) hours for 7 days 14 tablet 0   capecitabine (XELODA) 500 MG tablet Take 3 tablets (1,500 mg total) by mouth 2 (two) times daily after a meal. Take within 30 minutes after meal. Take for 7 days on, then 7 days off, repeat every 14 days. 84 tablet 0   diclofenac Sodium (VOLTAREN) 1 % GEL Research Patient: Apply 0.5 grams (1 fingertip) to each hand and each foot twice daily for up to 12 weeks 400 g 0   Lacosamide 100 MG TABS Take 1 tablet (100 mg total) by mouth 2 (two) times daily. 120 tablet 0   levETIRAcetam (KEPPRA) 1000 MG tablet Take 1 tablet (1,000 mg total) by mouth 2 (two) times daily.  120 tablet 0   LORazepam (ATIVAN) 1 MG tablet Take 1 tablet (1 mg total) by mouth every 6 (six) hours as needed for anxiety. 15 tablet 0   methocarbamol (ROBAXIN) 500 MG tablet Take 2 tablets (1,000 mg total) by mouth every 8 (eight) hours as needed for muscle spasms. 30 tablet 0   oxyCODONE (OXY IR/ROXICODONE) 5 MG immediate release tablet Take 1-2 tablets (5-10 mg total) by mouth every 6 (six) hours as needed for moderate pain or severe pain (5mg  moderate, 10mg  severe). (Patient taking differently: Take 5 mg by mouth every 6 (six) hours as needed for moderate pain (pain score 4-6) or severe pain (pain score 7-10).) 60 tablet 0   polyethylene glycol powder (GLYCOLAX/MIRALAX) 17 GM/SCOOP  powder Take 17 g by mouth daily mixed in liquid as directed. 238 g 0   simethicone (MYLICON) 80 MG chewable tablet Chew 1 tablet (80 mg total) by mouth 4 (four) times daily as needed for flatulence. 100 tablet 0   No current facility-administered medications for this visit.   Facility-Administered Medications Ordered in Other Visits  Medication Dose Route Frequency Provider Last Rate Last Admin   dextrose 5 % solution   Intravenous Continuous Malachy Mood, MD   Stopped at 08/28/23 1434   sodium chloride flush (NS) 0.9 % injection 10 mL  10 mL Intracatheter PRN Malachy Mood, MD   10 mL at 08/28/23 1547    PHYSICAL EXAMINATION: ECOG PERFORMANCE STATUS: 1 - Symptomatic but completely ambulatory  Vitals:   08/28/23 1013  BP: (!) 152/82  Pulse: 75  Resp: 16  Temp: 98.2 F (36.8 C)  SpO2: 100%   Wt Readings from Last 3 Encounters:  08/28/23 183 lb 6.4 oz (83.2 kg)  08/26/23 180 lb 4.8 oz (81.8 kg)  08/14/23 184 lb 9.6 oz (83.7 kg)     GENERAL:alert, no distress and comfortable SKIN: skin color, texture, turgor are normal, no rashes or significant lesions EYES: normal, Conjunctiva are pink and non-injected, sclera clear NECK: supple, thyroid normal size, non-tender, without nodularity LYMPH:  no palpable lymphadenopathy in the cervical, axillary  LUNGS: clear to auscultation and percussion with normal breathing effort HEART: regular rate & rhythm and no murmurs and no lower extremity edema ABDOMEN:abdomen soft, non-tender and normal bowel sounds Musculoskeletal:no cyanosis of digits and no clubbing  NEURO: alert & oriented x 3 with fluent speech, no focal motor/sensory deficits   LABORATORY DATA:  I have reviewed the data as listed    Latest Ref Rng & Units 08/28/2023    9:54 AM 08/14/2023    8:29 AM 08/07/2023    9:27 AM  CBC  WBC 4.0 - 10.5 K/uL 5.1  6.0  5.2   Hemoglobin 12.0 - 15.0 g/dL 52.8  41.3  24.4   Hematocrit 36.0 - 46.0 % 35.4  34.5  39.6   Platelets 150 - 400 K/uL  304  353  294         Latest Ref Rng & Units 08/28/2023    9:54 AM 08/14/2023    8:29 AM 08/03/2023    6:20 AM  CMP  Glucose 70 - 99 mg/dL 010  95    BUN 6 - 20 mg/dL 5  5    Creatinine 2.72 - 1.00 mg/dL 5.36  6.44    Sodium 034 - 145 mmol/L 140  140    Potassium 3.5 - 5.1 mmol/L 3.4  3.5    Chloride 98 - 111 mmol/L 106  105  CO2 22 - 32 mmol/L 26  28    Calcium 8.9 - 10.3 mg/dL 9.2  8.9    Total Protein 6.5 - 8.1 g/dL  7.7  7.2   Total Bilirubin 0.3 - 1.2 mg/dL  0.3  0.7   Alkaline Phos 38 - 126 U/L  92  81   AST 15 - 41 U/L  15  23   ALT 0 - 44 U/L  13  15       RADIOGRAPHIC STUDIES: I have personally reviewed the radiological images as listed and agreed with the findings in the report. No results found.    Orders Placed This Encounter  Procedures   CBC with Differential (Cancer Center Only)    Standing Status:   Future    Standing Expiration Date:   10/29/2024   Basic Metabolic Panel - Cancer Center Only    Standing Status:   Future    Standing Expiration Date:   10/29/2024   Hepatic function panel    Standing Status:   Standing    Number of Occurrences:   30    Standing Expiration Date:   08/27/2024   All questions were answered. The patient knows to call the clinic with any problems, questions or concerns. No barriers to learning was detected. The total time spent in the appointment was 30 minutes.     Malachy Mood, MD 08/28/2023

## 2023-08-28 NOTE — Progress Notes (Signed)
    DATE:  08/28/23                                        X CHEMO/IMMUNOTHERAPY REACTION             MD: Mosetta Putt   AGENT/BLOOD PRODUCT RECEIVING TODAY:              Oxaliplatin   AGENT/BLOOD PRODUCT RECEIVING IMMEDIATELY PRIOR TO REACTION:          Oxaliplatin    Vitals:   08/28/23 1414 08/28/23 1424  BP: (!) 168/86 (!) 168/86  Pulse: 100 92  Resp: 16 16  SpO2: 100% 100%      REACTION(S):           dizziness   PREMEDS:     Aloxi 0.25 mg IV, decadron 10 mg IV INTERVENTION: Ativan 2 mg IV   Review of Systems  Review of Systems  Eyes:  Positive for visual disturbance.  Neurological:  Positive for dizziness and tremors.  All other systems reviewed and are negative.    Physical Exam  Physical Exam Vitals and nursing note reviewed.  Constitutional:      Appearance: She is not ill-appearing or toxic-appearing.     Comments: Lips are twitching  HENT:     Head: Normocephalic.  Eyes:     Conjunctiva/sclera: Conjunctivae normal.  Cardiovascular:     Rate and Rhythm: Normal rate and regular rhythm.     Pulses: Normal pulses.     Heart sounds: Normal heart sounds.  Pulmonary:     Effort: Pulmonary effort is normal.     Breath sounds: Normal breath sounds.  Abdominal:     General: There is no distension.  Musculoskeletal:     Cervical back: Normal range of motion.  Skin:    General: Skin is warm and dry.  Neurological:     Mental Status: She is alert and oriented to person, place, and time.     Comments: Equal strength in extremities  Psychiatric:        Mood and Affect: Affect is flat.     OUTCOME:       Upon finishing Oxaliplatin infusion patient reported feeling dizziness and blurry vision. Daughter at the bedside noted patient's lips are quivering. These are similar symptoms to when she had seizures when her pump was discontinued. Administered 1 mg IV ativan and symptoms initially improved however returned therefore another 1 mg of ativan administered. Patient  was observed and symptoms resolved. Dr. Mosetta Putt evaluated patient and is concerned for patient's sensitivity to chemo. She plans to lower the dose for future treatments. I sent prescription to the pharmacy for prn ativan at Dr. Latanya Maudlin request. Patient stable to discharge home with daughter after prolonged observation period.

## 2023-08-28 NOTE — Assessment & Plan Note (Signed)
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  [name] in the liver MRI also showed a 1.8 cm oligo met in segment 7 of liver. -We discussed the incurable nature of her metastatic disease, due to her metastasis in peritoneum and liver.  We also discussed the small possibility of HIPEC and liver directed therapy if she has excellent response to chemotherapy. -I recommend first-line chemotherapy FOLFOX.  She started on July 31, 2023, oxaliplatin was held for first cycle due to open wound. -She developed seizure activity after first cycle of 5-FU infusion. She was evaluated by new oncologist Dr. Barbaraann Cao earlier this week, her seizure was felt to be unlikely related to 5-FU. She felt that seizure was coming after cycle 2 chemo when she coming in for pump d/c and required ativan. I will change her cehmo to CAPOX

## 2023-08-28 NOTE — Progress Notes (Signed)
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  Patient's chemotherapy plan changed to Xeloda and oxaliplatin secondary to seizure.  Weight documented as 183 pounds 6.4 ounces November 7.  This is decreased slightly from 185 pounds October 10.  Labs noted: Potassium 3.4, glucose 144, BUN 5, creatinine 0.71.  Patient is doing well.  Reports her appetite has increased and she is able to eat well.  She denies nausea, vomiting, constipation, diarrhea.  Reports she used samples of Juven.  States her surgical wound is healing.  She has not had to drink Ensure Plus or equivalent but she has some available if needed.  She voices no questions or concerns today.  Nutrition diagnosis: Increased calorie and protein nutrient needs, ongoing.  Intervention: Educated patient to continue strategies for adequate calorie and protein intake. Consider adding oral nutrition supplement if appetite or oral intake decreases. Strive for weight stabilization.  Monitoring, evaluation, goals: Patient will tolerate adequate calories and protein to minimize weight loss throughout treatment.  Next visit: Thursday, December 19 during infusion.  Patient has RD contact information if she develops questions.  **Disclaimer: This note was dictated with voice recognition software. Similar sounding words can inadvertently be transcribed and this note may contain transcription errors which may not have been corrected upon publication of note.**

## 2023-08-28 NOTE — Patient Instructions (Signed)
[facility] - A DEPT OF [facility]  Discharge Instructions: Thank you for choosing [facility] to provide your oncology and hematology care.   If you have a lab appointment with the Cancer Center, please go directly to the Cancer Center and check in at the registration area.   Wear comfortable clothing and clothing appropriate for easy access to any Portacath or PICC line.   We strive to give you quality time with your provider. You may need to reschedule your appointment if you arrive late (15 or more minutes).  Arriving late affects you and other patients whose appointments are after yours.  Also, if you miss three or more appointments without notifying the office, you may be dismissed from the clinic at the provider's discretion.      For prescription refill requests, have your pharmacy contact our office and allow 72 hours for refills to be completed.    Today you received the following chemotherapy and/or immunotherapy agents Oxaliplatin.      To help prevent nausea and vomiting after your treatment, we encourage you to take your nausea medication as directed.  BELOW ARE SYMPTOMS THAT SHOULD BE REPORTED IMMEDIATELY: *FEVER GREATER THAN 100.4 F (38 C) OR HIGHER *CHILLS OR SWEATING *NAUSEA AND VOMITING THAT IS NOT CONTROLLED WITH YOUR NAUSEA MEDICATION *UNUSUAL SHORTNESS OF BREATH *UNUSUAL BRUISING OR BLEEDING *URINARY PROBLEMS (pain or burning when urinating, or frequent urination) *BOWEL PROBLEMS (unusual diarrhea, constipation, pain near the anus) TENDERNESS IN MOUTH AND THROAT WITH OR WITHOUT PRESENCE OF ULCERS (sore throat, sores in mouth, or a toothache) UNUSUAL RASH, SWELLING OR PAIN  UNUSUAL VAGINAL DISCHARGE OR ITCHING   Items with * indicate a potential emergency and should be followed up as soon as possible or go to the Emergency Department if any problems should occur.  Please show the CHEMOTHERAPY ALERT CARD or  IMMUNOTHERAPY ALERT CARD at check-in to the Emergency Department and triage nurse.  Should you have questions after your visit or need to cancel or reschedule your appointment, please contact [facility] - A DEPT OF [facility]  Dept: [phone]  and follow the prompts.  Office hours are 8:00 a.m. to 4:30 p.m. Monday - Friday. Please note that voicemails left after 4:00 p.m. may not be returned until the following business day.  We are closed weekends and major holidays. You have access to a nurse at all times for urgent questions. Please call the main number to the clinic Dept: [phone] and follow the prompts.   For any non-urgent questions, you may also contact your provider using MyChart. We now offer e-Visits for anyone 22 and older to request care online for non-urgent symptoms. For details visit [email or web address].   Also download the MyChart app! Go to the app store, search "MyChart", open the app, select , and log in with your MyChart username and password.

## 2023-08-29 ENCOUNTER — Other Ambulatory Visit (HOSPITAL_BASED_OUTPATIENT_CLINIC_OR_DEPARTMENT_OTHER): Payer: Self-pay

## 2023-08-30 ENCOUNTER — Inpatient Hospital Stay: Payer: BC Managed Care – PPO

## 2023-09-01 ENCOUNTER — Encounter: Payer: Self-pay | Admitting: Hematology

## 2023-09-01 ENCOUNTER — Other Ambulatory Visit: Payer: Self-pay

## 2023-09-01 ENCOUNTER — Telehealth: Payer: Self-pay | Admitting: Hematology

## 2023-09-01 ENCOUNTER — Other Ambulatory Visit (HOSPITAL_COMMUNITY): Payer: Self-pay

## 2023-09-02 ENCOUNTER — Other Ambulatory Visit: Payer: Self-pay

## 2023-09-02 ENCOUNTER — Inpatient Hospital Stay: Payer: BC Managed Care – PPO | Admitting: Licensed Clinical Social Worker

## 2023-09-02 DIAGNOSIS — C172 Malignant neoplasm of ileum: Secondary | ICD-10-CM

## 2023-09-02 NOTE — Addendum Note (Signed)
Addended by: Arnette Felts F on: 09/02/2023 12:45 PM   Modules accepted: Level of Service

## 2023-09-02 NOTE — Progress Notes (Signed)
[facility] CSW Progress Note  Visual merchandiser  spoke with pt over the phone regarding financial concerns.  Pt is presently staying at her daughter's while recovering from surgery and starting chemotherapy.  Pt is receiving short term disability benefits, but her income has been significantly impacted.  Pt advised to apply for food stamps.  If approved for governmental monetary assistance pt would then qualify for the [employer].  CSW emailed pt the link to [employer] to apply for a grant that may assist w/ out of pocket healthcare expenses.  Pt also emailed contact information for [facility] to register for their financial resources.  Letter drafted and emailed to pt to [employer] to advocate for pt's car loan to be deferred at this time.  CSW to remain available as appropriate throughout duration of treatment.        Rachel Moulds, LCSW Clinical Social Worker [facility]

## 2023-09-04 ENCOUNTER — Telehealth: Payer: Self-pay

## 2023-09-04 ENCOUNTER — Inpatient Hospital Stay: Payer: BC Managed Care – PPO | Admitting: Hematology

## 2023-09-04 ENCOUNTER — Inpatient Hospital Stay: Payer: BC Managed Care – PPO

## 2023-09-04 VITALS — BP 147/76 | HR 67 | Temp 97.9°F | Ht 64.0 in | Wt 177.9 lb

## 2023-09-04 DIAGNOSIS — C172 Malignant neoplasm of ileum: Secondary | ICD-10-CM

## 2023-09-04 DIAGNOSIS — Z95828 Presence of other vascular implants and grafts: Secondary | ICD-10-CM

## 2023-09-04 DIAGNOSIS — C179 Malignant neoplasm of small intestine, unspecified: Secondary | ICD-10-CM | POA: Diagnosis not present

## 2023-09-04 DIAGNOSIS — C787 Secondary malignant neoplasm of liver and intrahepatic bile duct: Secondary | ICD-10-CM | POA: Diagnosis not present

## 2023-09-04 DIAGNOSIS — Z5111 Encounter for antineoplastic chemotherapy: Secondary | ICD-10-CM | POA: Diagnosis not present

## 2023-09-04 DIAGNOSIS — C786 Secondary malignant neoplasm of retroperitoneum and peritoneum: Secondary | ICD-10-CM | POA: Diagnosis not present

## 2023-09-04 DIAGNOSIS — Z79899 Other long term (current) drug therapy: Secondary | ICD-10-CM | POA: Diagnosis not present

## 2023-09-04 LAB — CBC WITH DIFFERENTIAL/PLATELET
Abs Immature Granulocytes: 0.1 10*3/uL — ABNORMAL HIGH (ref 0.00–0.07)
Basophils Absolute: 0 10*3/uL (ref 0.0–0.1)
Basophils Relative: 1 %
Eosinophils Absolute: 0 10*3/uL (ref 0.0–0.5)
Eosinophils Relative: 0 %
HCT: 39.9 % (ref 36.0–46.0)
Hemoglobin: 12.7 g/dL (ref 12.0–15.0)
Immature Granulocytes: 1 %
Lymphocytes Relative: 40 %
Lymphs Abs: 3 10*3/uL (ref 0.7–4.0)
MCH: 26.8 pg (ref 26.0–34.0)
MCHC: 31.8 g/dL (ref 30.0–36.0)
MCV: 84.4 fL (ref 80.0–100.0)
Monocytes Absolute: 0.7 10*3/uL (ref 0.1–1.0)
Monocytes Relative: 9 %
Neutro Abs: 3.6 10*3/uL (ref 1.7–7.7)
Neutrophils Relative %: 49 %
Platelets: 323 10*3/uL (ref 150–400)
RBC: 4.73 MIL/uL (ref 3.87–5.11)
RDW: 19.6 % — ABNORMAL HIGH (ref 11.5–15.5)
WBC: 7.4 10*3/uL (ref 4.0–10.5)
nRBC: 0 % (ref 0.0–0.2)

## 2023-09-04 LAB — CEA (IN HOUSE-CHCC): CEA (CHCC-In House): 24.49 ng/mL — ABNORMAL HIGH (ref 0.00–5.00)

## 2023-09-04 LAB — HEPATIC FUNCTION PANEL
ALT: 14 U/L (ref 0–44)
AST: 19 U/L (ref 15–41)
Albumin: 3.9 g/dL (ref 3.5–5.0)
Alkaline Phosphatase: 79 U/L (ref 38–126)
Bilirubin, Direct: <0.1 mg/dL (ref 0.0–0.2)
Total Bilirubin: 0.5 mg/dL (ref <1.2)
Total Protein: 8.2 g/dL — ABNORMAL HIGH (ref 6.5–8.1)

## 2023-09-04 MED ORDER — SODIUM CHLORIDE 0.9% FLUSH
10.0000 mL | Freq: Once | INTRAVENOUS | Status: AC
  Administered 2023-09-04: 10 mL

## 2023-09-04 MED ORDER — HEPARIN SOD (PORK) LOCK FLUSH 100 UNIT/ML IV SOLN
500.0000 [IU] | Freq: Once | INTRAVENOUS | Status: AC
  Administered 2023-09-04: 500 [IU]

## 2023-09-04 NOTE — Progress Notes (Signed)
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  [name] in the liver MRI also showed a 1.8 cm oligo met in segment 7 of liver. -We discussed the incurable nature of her metastatic disease, due to her metastasis in peritoneum and liver.  We also discussed the small possibility of HIPEC and liver directed therapy if she has excellent response to chemotherapy. -I recommend first-line chemotherapy FOLFOX.  She started on July 31, 2023, oxaliplatin was held for first cycle due to open wound. -She developed seizure activity after first cycle of 5-FU infusion. She was evaluated by new oncologist Dr. Barbaraann Cao earlier this week, her seizure was felt to be unlikely related to 5-FU. She felt that seizure was coming after cycle 2 chemo when she coming in for pump d/c and required ativan. I will change her cehmo to CAPOX    Assessment and Plan    Metastatic small bowel cancer Significant side effects from chemotherapy regimen (capecitabine and oxaliplatin), including fatigue, nausea, vomiting, and constipation. Severe nausea and vomiting started on the second day of the current cycle. Fatigue impairs daily activities and weight loss noted. 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 Suspected slow metabolism of medication leading to increased side effects. Discussed risks of continuing current regimen versus switching to alternative treatments like irinotecan. Informed consent obtained for adjusting chemotherapy dosage and schedule. - Stop capecitabine today due to poor tolerance - Resume capecitabine on November 25th at a reduced dose (two in the morning and two in the evening) - Schedule capecitabine for one week on, one week off - Continue intravenous chemotherapy oxaliplatin at the reduced dose - Take nausea medication 20-30 minutes before meals - Increase Miralax dose to two scoops or twice a day - Encourage maintaining weight with nutritional supplements like Ensure or homemade protein shakes - Monitor for any further seizures or severe side effects  Chemotherapy-Induced Nausea and Vomiting Persistent nausea and one episode of vomiting since starting the current cycle of capecitabine. Nausea medication ineffective if taken after symptom onset. Discussed scheduling nausea medication before meals to improve effectiveness. - Take nausea medication 20-30 minutes before meals - Monitor for effectiveness and adjust as needed  Chemotherapy-Induced Fatigue Severe fatigue impeding daily activities, likely a side effect of chemotherapy. Discussed adjusting chemotherapy dosage to see if symptoms improve. - Monitor fatigue levels - Adjust chemotherapy dosage to see if symptoms improve - Encourage rest and nutritional support  Chemotherapy-Induced Constipation Reports constipation despite using Miralax. Current dose takes three days to become effective. Discussed increasing Miralax dose to improve effectiveness. 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 Three  seizures in the past. Discussed alternative chemotherapy options like irinotecan if seizures  persist. - Monitor for any further seizures -Continue Keppra, she also has Ativan as needed for seizure events  Follow-up - Follow up on November 27th for the next infusion of oxaliplatin, will reduce dose to 70 mg/m --She will stop capecitabine today, due to poor tolerance -Call to capecitabine at reduced dose 1000 mg twice daily, 7 days on and 7 days off, on November 27        SUMMARY OF ONCOLOGIC HISTORY: Oncology History  Cancer of ileum with carcinomatosis  07/10/2023 Initial Diagnosis   Cancer of ileum with carcinomatosis   07/22/2023 Cancer Staging   Staging form: Small Intestine - Adenocarcinoma, AJCC 8th Edition - Pathologic: Stage IV (pT4, pNX, pM1) - Signed by Malachy Mood, MD on 07/22/2023 Histologic grade (G): G2 Histologic grading system: 4 grade system   07/31/2023 - 08/16/2023 Chemotherapy   Patient is on Treatment Plan : COLORECTAL FOLFOX q14d     08/28/2023 -  Chemotherapy   Patient is on Treatment Plan : COLORECTAL CAPEOX (130/850) q21d x 8 cycles        Discussed the use of AI scribe software for clinical note transcription with the patient, who gave verbal consent to proceed.  History of Present Illness   A 57 year old patient with metastatic cancer presents for follow-up. The patient reports significant side effects from the current oral chemotherapy regimen, including persistent fatigue, nausea, and vomiting. The patient states that she feels "tired all the time" and has vomited multiple times since starting the medication. The nausea is reported to be constant, and the patient has difficulty taking anti-nausea medication in time to prevent vomiting. The patient also reports a lack of appetite, eating only two meals a day due to the need to take the chemotherapy medication after meals. The patient has also experienced constipation, which is only partially relieved by  Miralax. The patient reports no pain or bloating. The patient's weight has decreased since the last visit, which is concerning given the patient's already compromised health status.        All other systems were reviewed with the patient and are negative.  MEDICAL HISTORY:  Past Medical History:  Diagnosis Date   Abnormal uterine bleeding 04/10/2022   Anemia    Hypertension    Pre-diabetes 02/2022    SURGICAL HISTORY: Past Surgical History:  Procedure Laterality Date   BREAST LUMPECTOMY WITH RADIOACTIVE SEED LOCALIZATION Left 06/10/2022   Procedure: LEFT BREAST LUMPECTOMY WITH RADIOACTIVE SEED LOCALIZATION;  Surgeon: Griselda Miner, MD;  Location: [facility];  Service: General;  Laterality: Left;   CESAREAN SECTION     x4   IR IMAGING GUIDED PORT INSERTION  07/08/2023   LAPAROTOMY N/A 06/30/2023   Procedure: EXPLORATORY LAPAROTOMY, SMALL BOWEL RESECTION, AND EXCISIONAL BIOPSIES OF MULTIPLE OMENTAL MASSES;  Surgeon: Berna Bue, MD;  Location: [facility];  Service: General;  Laterality: N/A;   TUBAL LIGATION      I have reviewed the social history and family history with the patient and they are unchanged from previous note.  ALLERGIES:  has No Known Allergies.  MEDICATIONS:  Current Outpatient Medications  Medication Sig Dispense Refill   acetaminophen (TYLENOL) 500 MG tablet Take 2 tablets (1,000 mg total) by mouth every 8 (eight) hours as needed for mild pain or fever.     amoxicillin-clavulanate (AUGMENTIN) 875-125 MG tablet Take 1 tablet by mouth every 12 (twelve) hours for 7 days 14 tablet 0   capecitabine (XELODA) 500 MG tablet Take 3 tablets (1,500  mg total) by mouth 2 (two) times daily after a meal. Take within 30 minutes after meal. Take for 7 days on, then 7 days off, repeat every 14 days. 84 tablet 0   diclofenac Sodium (VOLTAREN) 1 % GEL Research Patient: Apply 0.5 grams (1 fingertip) to each hand and each foot twice daily for up to 12 weeks 400 g 0    Lacosamide 100 MG TABS Take 1 tablet (100 mg total) by mouth 2 (two) times daily. 120 tablet 0   levETIRAcetam (KEPPRA) 1000 MG tablet Take 1 tablet (1,000 mg total) by mouth 2 (two) times daily. 120 tablet 0   LORazepam (ATIVAN) 1 MG tablet Take 1 tablet (1 mg total) by mouth every 6 (six) hours as needed for anxiety. 15 tablet 0   methocarbamol (ROBAXIN) 500 MG tablet Take 2 tablets (1,000 mg total) by mouth every 8 (eight) hours as needed for muscle spasms. 30 tablet 0   oxyCODONE (OXY IR/ROXICODONE) 5 MG immediate release tablet Take 1-2 tablets (5-10 mg total) by mouth every 6 (six) hours as needed for moderate pain or severe pain (5mg  moderate, 10mg  severe). (Patient taking differently: Take 5 mg by mouth every 6 (six) hours as needed for moderate pain (pain score 4-6) or severe pain (pain score 7-10).) 60 tablet 0   polyethylene glycol powder (GLYCOLAX/MIRALAX) 17 GM/SCOOP powder Take 17 g by mouth daily mixed in liquid as directed. 238 g 0   simethicone (MYLICON) 80 MG chewable tablet Chew 1 tablet (80 mg total) by mouth 4 (four) times daily as needed for flatulence. 100 tablet 0   No current facility-administered medications for this visit.    PHYSICAL EXAMINATION: ECOG PERFORMANCE STATUS: 2 - Symptomatic, <50% confined to bed  Vitals:   09/04/23 1203  BP: (!) 147/76  Pulse: 67  Temp: 97.9 F (36.6 C)  SpO2: 99%   Wt Readings from Last 3 Encounters:  09/04/23 177 lb 14.4 oz (80.7 kg)  08/28/23 183 lb 6.4 oz (83.2 kg)  08/26/23 180 lb 4.8 oz (81.8 kg)     GENERAL:alert, no distress and comfortable SKIN: skin color, texture, turgor are normal, no rashes or significant lesions EYES: normal, Conjunctiva are pink and non-injected, sclera clear NECK: supple, thyroid normal size, non-tender, without nodularity LYMPH:  no palpable lymphadenopathy in the cervical, axillary  LUNGS: clear to auscultation and percussion with normal breathing effort HEART: regular rate & rhythm and no  murmurs and no lower extremity edema ABDOMEN:abdomen soft, non-tender and normal bowel sounds Musculoskeletal:no cyanosis of digits and no clubbing  NEURO: alert & oriented x 3 with fluent speech, no focal motor/sensory deficits  LABORATORY DATA:  I have reviewed the data as listed    Latest Ref Rng & Units 09/04/2023   11:34 AM 08/28/2023    9:54 AM 08/14/2023    8:29 AM  CBC  WBC 4.0 - 10.5 K/uL 7.4  5.1  6.0   Hemoglobin 12.0 - 15.0 g/dL 32.9  51.8  84.1   Hematocrit 36.0 - 46.0 % 39.9  35.4  34.5   Platelets 150 - 400 K/uL 323  304  353         Latest Ref Rng & Units 09/04/2023   11:34 AM 08/28/2023    9:54 AM 08/14/2023    8:29 AM  CMP  Glucose 70 - 99 mg/dL  660  95   BUN 6 - 20 mg/dL  5  5   Creatinine 6.30 - 1.00 mg/dL  1.60  0.68   Sodium 135 - 145 mmol/L  140  140   Potassium 3.5 - 5.1 mmol/L  3.4  3.5   Chloride 98 - 111 mmol/L  106  105   CO2 22 - 32 mmol/L  26  28   Calcium 8.9 - 10.3 mg/dL  9.2  8.9   Total Protein 6.5 - 8.1 g/dL 8.2   7.7   Total Bilirubin <1.2 mg/dL 0.5   0.3   Alkaline Phos 38 - 126 U/L 79   92   AST 15 - 41 U/L 19   15   ALT 0 - 44 U/L 14   13       RADIOGRAPHIC STUDIES: I have personally reviewed the radiological images as listed and agreed with the findings in the report. No results found.    No orders of the defined types were placed in this encounter.  All questions were answered. The patient knows to call the clinic with any problems, questions or concerns. No barriers to learning was detected. The total time spent in the appointment was 25 minutes.     Malachy Mood, MD 09/04/2023

## 2023-09-04 NOTE — Telephone Encounter (Signed)
Spoke with pt's daughter Alen Blew via telephone regarding the MyChart message sent on 09/01/2023.  Informed Alen Blew that Dr. Mosetta Putt is seeing the pt today for Capecitabine (Xeloda) toxicity.  Alen Blew verbalized understanding and stated they are on their way now to the appt w/Dr. Mosetta Putt

## 2023-09-04 NOTE — Assessment & Plan Note (Signed)
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  [name] in the liver MRI also showed a 1.8 cm oligo met in segment 7 of liver. -We discussed the incurable nature of her metastatic disease, due to her metastasis in peritoneum and liver.  We also discussed the small possibility of HIPEC and liver directed therapy if she has excellent response to chemotherapy. -I recommend first-line chemotherapy FOLFOX.  She started on July 31, 2023, oxaliplatin was held for first cycle due to open wound. -She developed seizure activity after first cycle of 5-FU infusion. She was evaluated by new oncologist Dr. Barbaraann Cao earlier this week, her seizure was felt to be unlikely related to 5-FU. She felt that seizure was coming after cycle 2 chemo when she coming in for pump d/c and required ativan. I will change her cehmo to CAPOX

## 2023-09-09 ENCOUNTER — Other Ambulatory Visit: Payer: Self-pay

## 2023-09-11 ENCOUNTER — Ambulatory Visit: Payer: BC Managed Care – PPO | Admitting: Hematology

## 2023-09-11 ENCOUNTER — Encounter: Payer: Self-pay | Admitting: Hematology

## 2023-09-11 ENCOUNTER — Other Ambulatory Visit: Payer: Self-pay

## 2023-09-11 ENCOUNTER — Other Ambulatory Visit: Payer: BC Managed Care – PPO

## 2023-09-11 ENCOUNTER — Ambulatory Visit: Payer: BC Managed Care – PPO

## 2023-09-15 ENCOUNTER — Other Ambulatory Visit (HOSPITAL_COMMUNITY): Payer: Self-pay

## 2023-09-16 NOTE — Assessment & Plan Note (Signed)
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  [name] in the liver MRI also showed a 1.8 cm oligo met in segment 7 of liver. -We discussed the incurable nature of her metastatic disease, due to her metastasis in peritoneum and liver.  We also discussed the small possibility of HIPEC and liver directed therapy if she has excellent response to chemotherapy. -I recommend first-line chemotherapy FOLFOX.  She started on July 31, 2023, oxaliplatin was held for first cycle due to open wound. -She developed seizure activity after first cycle of 5-FU infusion. She was evaluated by new oncologist Dr. Barbaraann Cao earlier this week, her seizure was felt to be unlikely related to 5-FU. She felt that seizure was coming after cycle 2 chemo when she coming in for pump d/c and required ativan. I will change her cehmo to CAPOX

## 2023-09-17 ENCOUNTER — Inpatient Hospital Stay: Payer: BC Managed Care – PPO | Admitting: Hematology

## 2023-09-17 ENCOUNTER — Encounter: Payer: Self-pay | Admitting: Hematology

## 2023-09-17 ENCOUNTER — Encounter: Payer: Self-pay | Admitting: Nurse Practitioner

## 2023-09-17 ENCOUNTER — Inpatient Hospital Stay: Payer: BC Managed Care – PPO

## 2023-09-17 ENCOUNTER — Other Ambulatory Visit: Payer: Self-pay

## 2023-09-17 ENCOUNTER — Other Ambulatory Visit (HOSPITAL_COMMUNITY): Payer: Self-pay

## 2023-09-17 VITALS — BP 158/85 | HR 86 | Temp 97.4°F | Resp 18 | Ht 64.0 in | Wt 180.4 lb

## 2023-09-17 VITALS — BP 151/83 | HR 88 | Resp 16

## 2023-09-17 DIAGNOSIS — T81321A Disruption or dehiscence of closure of internal operation (surgical) wound of abdominal wall muscle or fascia, initial encounter: Secondary | ICD-10-CM | POA: Insufficient documentation

## 2023-09-17 DIAGNOSIS — C787 Secondary malignant neoplasm of liver and intrahepatic bile duct: Secondary | ICD-10-CM | POA: Diagnosis not present

## 2023-09-17 DIAGNOSIS — Z5111 Encounter for antineoplastic chemotherapy: Secondary | ICD-10-CM | POA: Diagnosis not present

## 2023-09-17 DIAGNOSIS — C172 Malignant neoplasm of ileum: Secondary | ICD-10-CM

## 2023-09-17 DIAGNOSIS — Z09 Encounter for follow-up examination after completed treatment for conditions other than malignant neoplasm: Secondary | ICD-10-CM | POA: Insufficient documentation

## 2023-09-17 DIAGNOSIS — D500 Iron deficiency anemia secondary to blood loss (chronic): Secondary | ICD-10-CM

## 2023-09-17 DIAGNOSIS — C179 Malignant neoplasm of small intestine, unspecified: Secondary | ICD-10-CM | POA: Diagnosis not present

## 2023-09-17 DIAGNOSIS — Z79899 Other long term (current) drug therapy: Secondary | ICD-10-CM | POA: Diagnosis not present

## 2023-09-17 DIAGNOSIS — C786 Secondary malignant neoplasm of retroperitoneum and peritoneum: Secondary | ICD-10-CM | POA: Diagnosis not present

## 2023-09-17 DIAGNOSIS — Z95828 Presence of other vascular implants and grafts: Secondary | ICD-10-CM

## 2023-09-17 LAB — BASIC METABOLIC PANEL - CANCER CENTER ONLY
Anion gap: 8 (ref 5–15)
BUN: <5 mg/dL — ABNORMAL LOW (ref 6–20)
CO2: 26 mmol/L (ref 22–32)
Calcium: 9.6 mg/dL (ref 8.9–10.3)
Chloride: 104 mmol/L (ref 98–111)
Creatinine: 0.74 mg/dL (ref 0.44–1.00)
GFR, Estimated: >60 mL/min (ref >60)
Glucose, Bld: 130 mg/dL — ABNORMAL HIGH (ref 70–99)
Potassium: 3.3 mmol/L — ABNORMAL LOW (ref 3.5–5.1)
Sodium: 138 mmol/L (ref 135–145)

## 2023-09-17 LAB — CBC WITH DIFFERENTIAL (CANCER CENTER ONLY)
Abs Immature Granulocytes: 0.01 10*3/uL (ref 0.00–0.07)
Basophils Absolute: 0 10*3/uL (ref 0.0–0.1)
Basophils Relative: 1 %
Eosinophils Absolute: 0 10*3/uL (ref 0.0–0.5)
Eosinophils Relative: 1 %
HCT: 40.4 % (ref 36.0–46.0)
Hemoglobin: 13.2 g/dL (ref 12.0–15.0)
Immature Granulocytes: 0 %
Lymphocytes Relative: 39 %
Lymphs Abs: 2.3 10*3/uL (ref 0.7–4.0)
MCH: 28.4 pg (ref 26.0–34.0)
MCHC: 32.7 g/dL (ref 30.0–36.0)
MCV: 86.9 fL (ref 80.0–100.0)
Monocytes Absolute: 0.5 10*3/uL (ref 0.1–1.0)
Monocytes Relative: 9 %
Neutro Abs: 3 10*3/uL (ref 1.7–7.7)
Neutrophils Relative %: 50 %
Platelet Count: 255 10*3/uL (ref 150–400)
RBC: 4.65 MIL/uL (ref 3.87–5.11)
RDW: 19.4 % — ABNORMAL HIGH (ref 11.5–15.5)
WBC Count: 5.9 10*3/uL (ref 4.0–10.5)
nRBC: 0 % (ref 0.0–0.2)

## 2023-09-17 LAB — PREGNANCY, URINE: Preg Test, Ur: NEGATIVE

## 2023-09-17 LAB — FERRITIN: Ferritin: 258 ng/mL (ref 11–307)

## 2023-09-17 MED ORDER — DEXTROSE 5 % IV SOLN: INTRAVENOUS | Status: DC

## 2023-09-17 MED ORDER — SODIUM CHLORIDE 0.9% FLUSH
10.0000 mL | Freq: Once | INTRAVENOUS | Status: AC
  Administered 2023-09-17: 10 mL

## 2023-09-17 MED ORDER — HEPARIN SOD (PORK) LOCK FLUSH 100 UNIT/ML IV SOLN
500.0000 [IU] | Freq: Once | INTRAVENOUS | Status: AC | PRN
  Administered 2023-09-17: 500 [IU]

## 2023-09-17 MED ORDER — OXALIPLATIN CHEMO INJECTION 100 MG/20ML
65.0000 mg/m2 | Freq: Once | INTRAVENOUS | Status: AC
  Administered 2023-09-17: 125 mg via INTRAVENOUS
  Filled 2023-09-17: qty 22.93

## 2023-09-17 MED ORDER — PALONOSETRON HCL INJECTION 0.25 MG/5ML
0.2500 mg | Freq: Once | INTRAVENOUS | Status: AC
  Administered 2023-09-17: 0.25 mg via INTRAVENOUS
  Filled 2023-09-17: qty 5

## 2023-09-17 MED ORDER — SODIUM CHLORIDE 0.9% FLUSH
10.0000 mL | INTRAVENOUS | Status: DC | PRN
  Administered 2023-09-17: 10 mL

## 2023-09-17 MED ORDER — DEXAMETHASONE SODIUM PHOSPHATE 10 MG/ML IJ SOLN
10.0000 mg | Freq: Once | INTRAMUSCULAR | Status: AC
  Administered 2023-09-17: 10 mg via INTRAVENOUS
  Filled 2023-09-17: qty 1

## 2023-09-17 MED ORDER — NITROGLYCERIN 0.4 MG SL SUBL
0.4000 mg | SUBLINGUAL_TABLET | SUBLINGUAL | 0 refills | Status: AC | PRN
  Filled 2023-09-17: qty 25, 15d supply, fill #0

## 2023-09-17 NOTE — Patient Instructions (Signed)
[facility] - A DEPT OF [facility]  Discharge Instructions: Thank you for choosing [facility] to provide your oncology and hematology care.   If you have a lab appointment with the Cancer Center, please go directly to the Cancer Center and check in at the registration area.   Wear comfortable clothing and clothing appropriate for easy access to any Portacath or PICC line.   We strive to give you quality time with your provider. You may need to reschedule your appointment if you arrive late (15 or more minutes).  Arriving late affects you and other patients whose appointments are after yours.  Also, if you miss three or more appointments without notifying the office, you may be dismissed from the clinic at the provider's discretion.      For prescription refill requests, have your pharmacy contact our office and allow 72 hours for refills to be completed.    Today you received the following chemotherapy and/or immunotherapy agents: Oxaliplatin     To help prevent nausea and vomiting after your treatment, we encourage you to take your nausea medication as directed.  BELOW ARE SYMPTOMS THAT SHOULD BE REPORTED IMMEDIATELY: *FEVER GREATER THAN 100.4 F (38 C) OR HIGHER *CHILLS OR SWEATING *NAUSEA AND VOMITING THAT IS NOT CONTROLLED WITH YOUR NAUSEA MEDICATION *UNUSUAL SHORTNESS OF BREATH *UNUSUAL BRUISING OR BLEEDING *URINARY PROBLEMS (pain or burning when urinating, or frequent urination) *BOWEL PROBLEMS (unusual diarrhea, constipation, pain near the anus) TENDERNESS IN MOUTH AND THROAT WITH OR WITHOUT PRESENCE OF ULCERS (sore throat, sores in mouth, or a toothache) UNUSUAL RASH, SWELLING OR PAIN  UNUSUAL VAGINAL DISCHARGE OR ITCHING   Items with * indicate a potential emergency and should be followed up as soon as possible or go to the Emergency Department if any problems should occur.  Please show the CHEMOTHERAPY ALERT CARD or  IMMUNOTHERAPY ALERT CARD at check-in to the Emergency Department and triage nurse.  Should you have questions after your visit or need to cancel or reschedule your appointment, please contact [facility] - A DEPT OF [facility]  Dept: [phone]  and follow the prompts.  Office hours are 8:00 a.m. to 4:30 p.m. Monday - Friday. Please note that voicemails left after 4:00 p.m. may not be returned until the following business day.  We are closed weekends and major holidays. You have access to a nurse at all times for urgent questions. Please call the main number to the clinic Dept: [phone] and follow the prompts.   For any non-urgent questions, you may also contact your provider using MyChart. We now offer e-Visits for anyone 72 and older to request care online for non-urgent symptoms. For details visit [email or web address].   Also download the MyChart app! Go to the app store, search "MyChart", open the app, select [facility], and log in with your MyChart username and password.

## 2023-09-17 NOTE — Assessment & Plan Note (Signed)
Called to general surgery office and they were able to get her scheduled for an appt to evaluate further.

## 2023-09-17 NOTE — Assessment & Plan Note (Signed)
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

## 2023-09-17 NOTE — Assessment & Plan Note (Addendum)
This is a new diagnosis and she is following up with Neurology and currently tolerating medications well, was admitted due this reason

## 2023-09-17 NOTE — Assessment & Plan Note (Signed)
TCM Performed. A member of the clinical team spoke with the patient upon dischare. Discharge summary was reviewed in full detail during the visit. Meds reconciled and compared to discharge meds. Medication list is updated and reviewed with the patient.  Greater than 50% face to face time was spent in counseling an coordination of care.  All questions were answered to the satisfaction of the patient.

## 2023-09-17 NOTE — Assessment & Plan Note (Signed)
At this time will continue to hold on her Ozempic. Continue focusing on healthy diet and regular diet

## 2023-09-17 NOTE — Progress Notes (Signed)
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  [name] in the liver MRI also showed a 1.8 cm oligo met in segment 7 of liver. -We discussed the incurable nature of her metastatic disease, due to her metastasis in peritoneum and liver.  We also discussed the small possibility of HIPEC and liver directed therapy if she has excellent response to chemotherapy. -I recommend first-line chemotherapy FOLFOX.  She started on July 31, 2023, oxaliplatin was held for first cycle due to open wound. -She developed seizure activity after first cycle of 5-FU infusion. She was evaluated by new oncologist Dr. Barbaraann Cao earlier this week, her seizure was felt to be unlikely related to 5-FU. She felt that seizure was coming after cycle 2 chemo when she coming in for pump d/c and required ativan. 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 Plan to try agai with reduced dose. Reports no nausea or diarrhea. Tumor marker CEA decreased from 77 to 24, indicating potential response to chemotherapy. Discussed risks of continued  chemotherapy including potential for seizures and cold sensitivity due to oxaliplatin. Discussed alternative treatment options including irinotecan and surgery (HIPEC) if liver metastasis responds well to chemotherapy. Long-term treatment likely necessary due to metastatic spread. - Administer reduced dose of capecitabine (50% of initial dose), she will continue Xeloda for 7 days on and 7 days off. - Schedule CT scan for early January - Continue monitoring CEA levels - Discuss potential for maintenance therapy if tumor responds - Educate on cold sensitivity due to oxaliplatin and advise wearing warm clothing, mask, hat, and gloves when going outside - Ensure patient has Ativan for seizure management and advise carrying it when going out - Schedule next infusion on December 19 - Start next cycle of capecitabine on December 9  Seizure Activity Previous seizure activity associated with capecitabine. No recent seizures reported. Discussed potential for seizures with continued chemotherapy and alternative treatment options if seizures recur. - Ensure patient has Ativan and advise carrying it when going out - Monitor for any seizure activity and report immediately  Chest Discomfort Intermittent chest tightness and palpitations experienced last night and this morning, lasting about 10 minutes each time. No prior history of cardiac issues. Differential diagnosis includes cardiac etiology, heartburn, or acid reflux. - Perform EKG to rule out cardiac issues - Advise rest and symptom monitoring  Plan -Lab reviewed, adequate for treatment, will proceed oxaliplatin at 15% reduced dose, and continue Xeloda for 7 days on and 7 days off (she started this cycle on November 25) -EKG showed T wave change in V4-6, new from EKG in the past few months, but patient had a similar T wave change in the EKG  in 2023.  I spoke with on-call cardiologist Dr. Excell Seltzer and he feels the EKG change is likely nonspecific, will  monitor closely  -I also called in nitroglycerin as needed for chest pain.  Patient noticed to go to emergency room for recurrent persistent chest pain - Schedule follow-up appointment with nurse practitioner on December 19 with repeated EKG - Advise to call if any problems arise.         SUMMARY OF ONCOLOGIC HISTORY: Oncology History  Cancer of ileum with carcinomatosis  07/10/2023 Initial Diagnosis   Cancer of ileum with carcinomatosis   07/22/2023 Cancer Staging   Staging form: Small Intestine - Adenocarcinoma, AJCC 8th Edition - Pathologic: Stage IV (pT4, pNX, pM1) - Signed by Malachy Mood, MD on 07/22/2023 Histologic grade (G): G2 Histologic grading system: 4 grade system   07/31/2023 - 08/16/2023 Chemotherapy   Patient is on Treatment Plan : COLORECTAL FOLFOX q14d     08/28/2023 -  Chemotherapy   Patient is on Treatment Plan : COLORECTAL CAPEOX (130/850) q21d x 8 cycles        Discussed the use of AI scribe software for clinical note transcription with the patient, who gave verbal consent to proceed.  History of Present Illness   The patient, a 57 year old female with metastatic small bowel cancer, presents for follow-up. She reports recent episodes of chest tightness and heart racing, which occurred both at rest and during activity. The episodes lasted approximately 10 minutes and were associated with shaking. The patient denies any previous history of similar symptoms or known heart disease.  The patient also reports that she stopped taking the chemotherapy pill, capecitabine, after her last visit and started feeling better. She denies any recent seizures or similar activity. Despite this, she has lost weight and has been experiencing intermittent chest discomfort, which she describes as heart racing and some tightness.  The patient also has a history of seizures, which have been managed with Ativan. She has been willing to try a lower dose of her chemotherapy regimen to see if  this reduces the frequency of seizures.         All other systems were reviewed with the patient and are negative.  MEDICAL HISTORY:  Past Medical History:  Diagnosis Date   Abnormal uterine bleeding 04/10/2022   Anemia    Hypertension    Pre-diabetes 02/2022    SURGICAL HISTORY: Past Surgical History:  Procedure Laterality Date   BREAST LUMPECTOMY WITH RADIOACTIVE SEED LOCALIZATION Left 06/10/2022   Procedure: LEFT BREAST LUMPECTOMY WITH RADIOACTIVE SEED LOCALIZATION;  Surgeon: Griselda Miner, MD;  Location: [facility];  Service: General;  Laterality: Left;   CESAREAN SECTION     x4   IR IMAGING GUIDED PORT INSERTION  07/08/2023   LAPAROTOMY N/A 06/30/2023   Procedure: EXPLORATORY LAPAROTOMY, SMALL BOWEL RESECTION, AND EXCISIONAL BIOPSIES OF MULTIPLE OMENTAL MASSES;  Surgeon: Berna Bue, MD;  Location: [facility];  Service: General;  Laterality: N/A;   TUBAL LIGATION      I have reviewed the social history and family history with the patient and they are unchanged from previous note.  ALLERGIES:  has No Known Allergies.  MEDICATIONS:  Current Outpatient Medications  Medication Sig Dispense Refill   nitroGLYCERIN (NITROSTAT) 0.4 MG SL tablet Place 1 tablet (0.4 mg total) under the tongue every 5 (five) minutes as needed for chest pain. 10 tablet 0   acetaminophen (TYLENOL) 500 MG tablet Take 2 tablets (1,000 mg total) by mouth every  8 (eight) hours as needed for mild pain or fever.     amoxicillin-clavulanate (AUGMENTIN) 875-125 MG tablet Take 1 tablet by mouth every 12 (twelve) hours for 7 days 14 tablet 0   capecitabine (XELODA) 500 MG tablet Take 3 tablets (1,500 mg total) by mouth 2 (two) times daily after a meal. Take within 30 minutes after meal. Take for 7 days on, then 7 days off, repeat every 14 days. 84 tablet 0   diclofenac Sodium (VOLTAREN) 1 % GEL Research Patient: Apply 0.5 grams (1 fingertip) to each hand and each foot twice daily for up to 12  weeks 400 g 0   Lacosamide 100 MG TABS Take 1 tablet (100 mg total) by mouth 2 (two) times daily. 120 tablet 0   levETIRAcetam (KEPPRA) 1000 MG tablet Take 1 tablet (1,000 mg total) by mouth 2 (two) times daily. 120 tablet 0   LORazepam (ATIVAN) 1 MG tablet Take 1 tablet (1 mg total) by mouth every 6 (six) hours as needed for anxiety. 15 tablet 0   methocarbamol (ROBAXIN) 500 MG tablet Take 2 tablets (1,000 mg total) by mouth every 8 (eight) hours as needed for muscle spasms. 30 tablet 0   oxyCODONE (OXY IR/ROXICODONE) 5 MG immediate release tablet Take 1-2 tablets (5-10 mg total) by mouth every 6 (six) hours as needed for moderate pain or severe pain (5mg  moderate, 10mg  severe). (Patient taking differently: Take 5 mg by mouth every 6 (six) hours as needed for moderate pain (pain score 4-6) or severe pain (pain score 7-10).) 60 tablet 0   polyethylene glycol powder (GLYCOLAX/MIRALAX) 17 GM/SCOOP powder Take 17 g by mouth daily mixed in liquid as directed. 238 g 0   simethicone (MYLICON) 80 MG chewable tablet Chew 1 tablet (80 mg total) by mouth 4 (four) times daily as needed for flatulence. 100 tablet 0   No current facility-administered medications for this visit.   Facility-Administered Medications Ordered in Other Visits  Medication Dose Route Frequency Provider Last Rate Last Admin   dextrose 5 % solution   Intravenous Continuous Malachy Mood, MD   Stopped at 09/17/23 1519   sodium chloride flush (NS) 0.9 % injection 10 mL  10 mL Intracatheter PRN Malachy Mood, MD   10 mL at 09/17/23 1517    PHYSICAL EXAMINATION: ECOG PERFORMANCE STATUS: 1 - Symptomatic but completely ambulatory  Vitals:   09/17/23 1124  BP: (!) 158/85  Pulse: 86  Resp: 18  Temp: (!) 97.4 F (36.3 C)  SpO2: 100%   Wt Readings from Last 3 Encounters:  09/17/23 180 lb 6.4 oz (81.8 kg)  09/04/23 177 lb 14.4 oz (80.7 kg)  08/28/23 183 lb 6.4 oz (83.2 kg)     GENERAL:alert, no distress and comfortable SKIN: skin color,  texture, turgor are normal, no rashes or significant lesions EYES: normal, Conjunctiva are pink and non-injected, sclera clear NECK: supple, thyroid normal size, non-tender, without nodularity LYMPH:  no palpable lymphadenopathy in the cervical, axillary  LUNGS: clear to auscultation and percussion with normal breathing effort HEART: regular rate & rhythm and no murmurs and no lower extremity edema ABDOMEN:abdomen soft, non-tender and normal bowel sounds Musculoskeletal:no cyanosis of digits and no clubbing  NEURO: alert & oriented x 3 with fluent speech, no focal motor/sensory deficits   LABORATORY DATA:  I have reviewed the data as listed    Latest Ref Rng & Units 09/17/2023   11:08 AM 09/04/2023   11:34 AM 08/28/2023    9:54 AM  CBC  WBC 4.0 - 10.5 K/uL 5.9  7.4  5.1   Hemoglobin 12.0 - 15.0 g/dL 04.5  40.9  81.1   Hematocrit 36.0 - 46.0 % 40.4  39.9  35.4   Platelets 150 - 400 K/uL 255  323  304         Latest Ref Rng & Units 09/17/2023   11:08 AM 09/04/2023   11:34 AM 08/28/2023    9:54 AM  CMP  Glucose 70 - 99 mg/dL 914   782   BUN 6 - 20 mg/dL <5   5   Creatinine 9.56 - 1.00 mg/dL 2.13   0.86   Sodium 578 - 145 mmol/L 138   140   Potassium 3.5 - 5.1 mmol/L 3.3   3.4   Chloride 98 - 111 mmol/L 104   106   CO2 22 - 32 mmol/L 26   26   Calcium 8.9 - 10.3 mg/dL 9.6   9.2   Total Protein 6.5 - 8.1 g/dL  8.2    Total Bilirubin <1.2 mg/dL  0.5    Alkaline Phos 38 - 126 U/L  79    AST 15 - 41 U/L  19    ALT 0 - 44 U/L  14        RADIOGRAPHIC STUDIES: I have personally reviewed the radiological images as listed and agreed with the findings in the report. No results found.    Orders Placed This Encounter  Procedures   CBC with Differential (Cancer Center Only)    Standing Status:   Future    Standing Expiration Date:   11/18/2024   Basic Metabolic Panel - Cancer Center Only    Standing Status:   Future    Standing Expiration Date:   11/18/2024   All questions  were answered. The patient knows to call the clinic with any problems, questions or concerns. No barriers to learning was detected. The total time spent in the appointment was 40 minutes.     Malachy Mood, MD 09/17/2023

## 2023-09-21 ENCOUNTER — Other Ambulatory Visit: Payer: Self-pay

## 2023-09-22 ENCOUNTER — Inpatient Hospital Stay: Payer: BC Managed Care – PPO | Attending: Hematology | Admitting: Licensed Clinical Social Worker

## 2023-09-22 ENCOUNTER — Encounter: Payer: Self-pay | Admitting: Hematology

## 2023-09-22 ENCOUNTER — Other Ambulatory Visit (HOSPITAL_COMMUNITY): Payer: Self-pay

## 2023-09-22 DIAGNOSIS — Z79899 Other long term (current) drug therapy: Secondary | ICD-10-CM | POA: Insufficient documentation

## 2023-09-22 DIAGNOSIS — C787 Secondary malignant neoplasm of liver and intrahepatic bile duct: Secondary | ICD-10-CM | POA: Insufficient documentation

## 2023-09-22 DIAGNOSIS — C786 Secondary malignant neoplasm of retroperitoneum and peritoneum: Secondary | ICD-10-CM | POA: Insufficient documentation

## 2023-09-22 DIAGNOSIS — Z5111 Encounter for antineoplastic chemotherapy: Secondary | ICD-10-CM | POA: Insufficient documentation

## 2023-09-22 DIAGNOSIS — C172 Malignant neoplasm of ileum: Secondary | ICD-10-CM

## 2023-09-22 DIAGNOSIS — C179 Malignant neoplasm of small intestine, unspecified: Secondary | ICD-10-CM | POA: Insufficient documentation

## 2023-09-22 NOTE — Progress Notes (Signed)
Received call from patient regarding applying for [employer]. Advised patient what would be needed to apply at her next visit on 12/19 as well as she will be receiving a call from Lenise to advise as well. She verbalized understanding.   She has my card as well as Lenise's for any additional financial questions or concerns.

## 2023-09-22 NOTE — Progress Notes (Signed)
[facility] CSW Progress Note  Visual merchandiser  spoke with pt over the phone.  Pt informed CSW she has been approved for food stamps and would like to apply for the [employer].  Pt given contact information for the financial resource specialist to initiate the application process.  CSW to remain available as appropriate to provide support throughout duration of treatment.        Rachel Moulds, LCSW Clinical Social Worker [facility]

## 2023-09-23 ENCOUNTER — Encounter: Payer: Self-pay | Admitting: Hematology

## 2023-09-23 ENCOUNTER — Other Ambulatory Visit (HOSPITAL_COMMUNITY): Payer: Self-pay

## 2023-09-23 ENCOUNTER — Other Ambulatory Visit: Payer: Self-pay

## 2023-09-23 ENCOUNTER — Other Ambulatory Visit: Payer: Self-pay | Admitting: Hematology

## 2023-09-23 DIAGNOSIS — C172 Malignant neoplasm of ileum: Secondary | ICD-10-CM

## 2023-09-23 MED ORDER — CAPECITABINE 500 MG PO TABS
850.0000 mg/m2 | ORAL_TABLET | Freq: Two times a day (BID) | ORAL | 0 refills | Status: DC
  Filled 2023-09-23: qty 84, 28d supply, fill #0

## 2023-09-23 NOTE — Progress Notes (Signed)
Specialty Pharmacy Refill Coordination Note  Linda [name] is a 57 y.o. female contacted today regarding refills of specialty medication(s) Capecitabine   Patient requested Delivery   Delivery date: 10/03/23   Verified address: [address] [place], [place], [place] [address]   Medication will be filled on 10/02/23. *Pending refill request*

## 2023-09-23 NOTE — Progress Notes (Signed)
Specialty Pharmacy Ongoing Clinical Assessment Note  Linda [name] is a 57 y.o. female who is being followed by the specialty pharmacy service for RxSp Oncology   Patient's specialty medication(s) reviewed today: Capecitabine   Missed doses in the last 4 weeks: 0   Patient/Caregiver asked additional questions regarding dosing instructions, all questions were answered and it was verified that the patient has been dosing correctly.  Therapeutic benefit summary: Patient is achieving benefit   Adverse events/side effects summary: Experienced adverse events/side effects (Patient was experiencing constipation, stomach pain, no appetite, no energy on the higher dose, since her provider lowered her dose she only complains of low energy at this time.)   Patient's therapy is appropriate to: Continue    Goals Addressed             This Visit's Progress    Slow Disease Progression   No change    Patient is initiating therapy. Patient will maintain adherence.         Follow up:  3 months  Servando Snare Specialty Pharmacist

## 2023-09-25 ENCOUNTER — Other Ambulatory Visit: Payer: Self-pay

## 2023-09-25 ENCOUNTER — Ambulatory Visit: Payer: BC Managed Care – PPO

## 2023-09-25 ENCOUNTER — Other Ambulatory Visit: Payer: BC Managed Care – PPO

## 2023-09-25 ENCOUNTER — Ambulatory Visit: Payer: BC Managed Care – PPO | Admitting: Hematology

## 2023-09-26 ENCOUNTER — Other Ambulatory Visit (HOSPITAL_COMMUNITY): Payer: Self-pay

## 2023-09-26 ENCOUNTER — Other Ambulatory Visit (HOSPITAL_BASED_OUTPATIENT_CLINIC_OR_DEPARTMENT_OTHER): Payer: Self-pay

## 2023-09-28 ENCOUNTER — Other Ambulatory Visit: Payer: Self-pay

## 2023-09-29 ENCOUNTER — Encounter: Payer: Self-pay | Admitting: Hematology

## 2023-09-29 NOTE — Progress Notes (Signed)
Called pt to introduce myself as her Dance movement psychotherapist and to discuss the [employer].  Pt would like to apply so I emailed her an expense sheet and she will provide her proof of income and SNAP benefits on 10/09/23.  Once received I will approve her for the [employer].

## 2023-09-30 ENCOUNTER — Other Ambulatory Visit (HOSPITAL_COMMUNITY): Payer: Self-pay

## 2023-09-30 NOTE — Addendum Note (Signed)
Addended by: Arnette Felts F on: 09/30/2023 12:36 PM   Modules accepted: Level of Service

## 2023-10-01 ENCOUNTER — Other Ambulatory Visit (HOSPITAL_BASED_OUTPATIENT_CLINIC_OR_DEPARTMENT_OTHER): Payer: Self-pay

## 2023-10-01 ENCOUNTER — Telehealth: Payer: Self-pay

## 2023-10-01 ENCOUNTER — Other Ambulatory Visit (HOSPITAL_COMMUNITY): Payer: Self-pay

## 2023-10-01 ENCOUNTER — Other Ambulatory Visit: Payer: Self-pay | Admitting: Internal Medicine

## 2023-10-01 MED ORDER — LACOSAMIDE 100 MG PO TABS: 100.0000 mg | ORAL_TABLET | Freq: Two times a day (BID) | ORAL | 0 refills | Status: DC

## 2023-10-01 MED ORDER — LEVETIRACETAM 1000 MG PO TABS
1000.0000 mg | ORAL_TABLET | Freq: Two times a day (BID) | ORAL | 0 refills | Status: DC
  Filled 2023-10-01: qty 60, 30d supply, fill #0

## 2023-10-01 NOTE — Telephone Encounter (Signed)
TC from patient, requesting refills for Keppra and Vimpat--would like these called in to [facility] on [facility]. Pt reports no seizures so far in the month of December. Informed that meds will be submitted for a one month supply, and advised to call office w/ update about seizure activity before next refills are due. She verbalizes understanding.

## 2023-10-02 ENCOUNTER — Other Ambulatory Visit: Payer: Self-pay

## 2023-10-02 ENCOUNTER — Encounter: Payer: Self-pay | Admitting: Hematology

## 2023-10-03 ENCOUNTER — Other Ambulatory Visit: Payer: Self-pay | Admitting: Internal Medicine

## 2023-10-03 ENCOUNTER — Other Ambulatory Visit (HOSPITAL_COMMUNITY): Payer: Self-pay

## 2023-10-06 ENCOUNTER — Encounter (HOSPITAL_COMMUNITY): Payer: Self-pay

## 2023-10-06 ENCOUNTER — Other Ambulatory Visit (HOSPITAL_COMMUNITY): Payer: Self-pay

## 2023-10-06 ENCOUNTER — Encounter: Payer: Self-pay | Admitting: Hematology

## 2023-10-06 ENCOUNTER — Other Ambulatory Visit: Payer: Self-pay | Admitting: *Deleted

## 2023-10-06 ENCOUNTER — Other Ambulatory Visit: Payer: Self-pay | Admitting: Internal Medicine

## 2023-10-06 MED ORDER — LACOSAMIDE 100 MG PO TABS: 100.0000 mg | ORAL_TABLET | Freq: Two times a day (BID) | ORAL | 0 refills | Status: DC

## 2023-10-06 MED ORDER — LACOSAMIDE 100 MG PO TABS
100.0000 mg | ORAL_TABLET | Freq: Two times a day (BID) | ORAL | 3 refills | Status: DC
  Filled 2023-10-06: qty 60, 30d supply, fill #0
  Filled 2023-11-02: qty 60, 30d supply, fill #1
  Filled 2023-12-01: qty 60, 30d supply, fill #2
  Filled 2023-12-26 – 2023-12-30 (×2): qty 60, 30d supply, fill #3

## 2023-10-09 ENCOUNTER — Encounter: Payer: Self-pay | Admitting: Hematology

## 2023-10-09 ENCOUNTER — Inpatient Hospital Stay: Payer: BC Managed Care – PPO

## 2023-10-09 ENCOUNTER — Inpatient Hospital Stay: Payer: BC Managed Care – PPO | Admitting: Nutrition

## 2023-10-09 ENCOUNTER — Inpatient Hospital Stay: Payer: BC Managed Care – PPO | Admitting: Nurse Practitioner

## 2023-10-09 VITALS — BP 139/80 | HR 82 | Temp 97.9°F | Resp 17 | Wt 187.2 lb

## 2023-10-09 VITALS — BP 139/80 | HR 82 | Temp 97.9°F | Resp 17 | Wt 187.4 lb

## 2023-10-09 DIAGNOSIS — C172 Malignant neoplasm of ileum: Secondary | ICD-10-CM

## 2023-10-09 DIAGNOSIS — C179 Malignant neoplasm of small intestine, unspecified: Secondary | ICD-10-CM | POA: Diagnosis not present

## 2023-10-09 DIAGNOSIS — Z79899 Other long term (current) drug therapy: Secondary | ICD-10-CM | POA: Diagnosis not present

## 2023-10-09 DIAGNOSIS — C787 Secondary malignant neoplasm of liver and intrahepatic bile duct: Secondary | ICD-10-CM | POA: Diagnosis not present

## 2023-10-09 DIAGNOSIS — Z5111 Encounter for antineoplastic chemotherapy: Secondary | ICD-10-CM | POA: Diagnosis not present

## 2023-10-09 DIAGNOSIS — C786 Secondary malignant neoplasm of retroperitoneum and peritoneum: Secondary | ICD-10-CM | POA: Diagnosis not present

## 2023-10-09 DIAGNOSIS — Z95828 Presence of other vascular implants and grafts: Secondary | ICD-10-CM

## 2023-10-09 LAB — CBC WITH DIFFERENTIAL (CANCER CENTER ONLY)
Abs Immature Granulocytes: 0.01 10*3/uL (ref 0.00–0.07)
Basophils Absolute: 0 10*3/uL (ref 0.0–0.1)
Basophils Relative: 0 %
Eosinophils Absolute: 0.1 10*3/uL (ref 0.0–0.5)
Eosinophils Relative: 1 %
HCT: 39.6 % (ref 36.0–46.0)
Hemoglobin: 13 g/dL (ref 12.0–15.0)
Immature Granulocytes: 0 %
Lymphocytes Relative: 39 %
Lymphs Abs: 2.5 10*3/uL (ref 0.7–4.0)
MCH: 29 pg (ref 26.0–34.0)
MCHC: 32.8 g/dL (ref 30.0–36.0)
MCV: 88.2 fL (ref 80.0–100.0)
Monocytes Absolute: 0.6 10*3/uL (ref 0.1–1.0)
Monocytes Relative: 9 %
Neutro Abs: 3.3 10*3/uL (ref 1.7–7.7)
Neutrophils Relative %: 51 %
Platelet Count: 327 10*3/uL (ref 150–400)
RBC: 4.49 MIL/uL (ref 3.87–5.11)
RDW: 17.7 % — ABNORMAL HIGH (ref 11.5–15.5)
WBC Count: 6.5 10*3/uL (ref 4.0–10.5)
nRBC: 0 % (ref 0.0–0.2)

## 2023-10-09 LAB — HEPATIC FUNCTION PANEL
ALT: 15 U/L (ref 0–44)
AST: 21 U/L (ref 15–41)
Albumin: 3.8 g/dL (ref 3.5–5.0)
Alkaline Phosphatase: 83 U/L (ref 38–126)
Bilirubin, Direct: <0.1 mg/dL (ref 0.0–0.2)
Total Bilirubin: 0.3 mg/dL (ref <1.2)
Total Protein: 7.9 g/dL (ref 6.5–8.1)

## 2023-10-09 LAB — BASIC METABOLIC PANEL - CANCER CENTER ONLY
Anion gap: 7 (ref 5–15)
BUN: 6 mg/dL (ref 6–20)
CO2: 28 mmol/L (ref 22–32)
Calcium: 9.3 mg/dL (ref 8.9–10.3)
Chloride: 105 mmol/L (ref 98–111)
Creatinine: 0.83 mg/dL (ref 0.44–1.00)
GFR, Estimated: >60 mL/min (ref >60)
Glucose, Bld: 106 mg/dL — ABNORMAL HIGH (ref 70–99)
Potassium: 3.5 mmol/L (ref 3.5–5.1)
Sodium: 140 mmol/L (ref 135–145)

## 2023-10-09 LAB — PREGNANCY, URINE: Preg Test, Ur: NEGATIVE

## 2023-10-09 MED ORDER — DEXTROSE 5 % IV SOLN: INTRAVENOUS | Status: DC

## 2023-10-09 MED ORDER — SODIUM CHLORIDE 0.9% FLUSH
10.0000 mL | Freq: Once | INTRAVENOUS | Status: AC
  Administered 2023-10-09: 10 mL

## 2023-10-09 MED ORDER — DEXTROSE 5 % IV SOLN
65.0000 mg/m2 | Freq: Once | INTRAVENOUS | Status: AC
  Administered 2023-10-09: 125 mg via INTRAVENOUS
  Filled 2023-10-09: qty 25

## 2023-10-09 MED ORDER — DEXAMETHASONE SODIUM PHOSPHATE 10 MG/ML IJ SOLN
10.0000 mg | Freq: Once | INTRAMUSCULAR | Status: AC
  Administered 2023-10-09: 10 mg via INTRAVENOUS
  Filled 2023-10-09: qty 1

## 2023-10-09 MED ORDER — PALONOSETRON HCL INJECTION 0.25 MG/5ML
0.2500 mg | Freq: Once | INTRAVENOUS | Status: AC
  Administered 2023-10-09: 0.25 mg via INTRAVENOUS
  Filled 2023-10-09: qty 5

## 2023-10-09 MED ORDER — HEPARIN SOD (PORK) LOCK FLUSH 100 UNIT/ML IV SOLN
500.0000 [IU] | Freq: Once | INTRAVENOUS | Status: AC | PRN
Start: 2023-10-09 — End: 2023-10-09
  Administered 2023-10-09: 500 [IU]

## 2023-10-09 MED ORDER — SODIUM CHLORIDE 0.9% FLUSH
10.0000 mL | INTRAVENOUS | Status: DC | PRN
Start: 2023-10-09 — End: 2023-10-09
  Administered 2023-10-09: 10 mL

## 2023-10-09 NOTE — Progress Notes (Signed)
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  Weight improved and was documented as 187 pounds 4 ounces December 19.  This is increased from 180 pounds 6.4 ounces November 27.  Labs reviewed.  Patient declines scratch that patient denies nutrition impact symptoms.  She is eating well.  She has no questions or concerns.  Reports her wound has healed.  Nutrition diagnosis: Increased calorie and protein needs resolved.  I have encouraged patient to continue strategies for adequate calories and protein to minimize weight loss.  Encouraged her to reach out and contact RD if she has further questions or concerns.  She has contact information.

## 2023-10-09 NOTE — Progress Notes (Signed)
Pt is approved for the $1000 [employer] grant.  

## 2023-10-09 NOTE — Progress Notes (Signed)
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  [name] in the liver MRI also showed a 1.8 cm oligo met in segment 7 of liver. -We discussed the incurable nature of her metastatic disease, due to her metastasis in peritoneum and liver.  We also discussed the small possibility of HIPEC and liver directed therapy if she has excellent response to chemotherapy. -First-line chemotherapy FOLFOX was done.  She started on July 31, 2023, oxaliplatin was held for first cycle due to open wound. -She developed seizure activity after first cycle of 5-FU infusion. She was evaluated by neuro oncologist Dr. Barbaraann Cao earlier this week, her seizure was felt to be unrelated to 5-FU. She felt that seizure was coming after cycle 2 chemo when she coming in for pump d/c and required ativan.  He has thus changed to CAPEOX. -10/09/2023 -today is cycle 3 day 1.     Plan: Patient seen in infusion. Labs reviewed  -CBC showing WBC 6.5; Hgb 13.0; Hct 39.6; Plt 327; Anc 3.3 -CMP - K 3.5; glucose 106; BUN 6; Creatinine 0.83; eGFR > 60; Ca 9.3.   Clinically, patient tolerating chemotherapy well.  Proceed with cycle 3 day 1 CapeOx. Labs/flush, follow-up, and treatment in 3 weeks as scheduled. Will need repeat EKG.  The patient understands the plans discussed today and is in agreement with them.  She knows to contact our office if she  develops concerns prior to her next appointment.  I provided 25 minutes of face-to-face time during this encounter and > 50% was spent counseling as documented under my assessment and plan.    Carlean Jews, NP  [facility] - A DEPT OF [facility] [address] [place] [address] Dept: [phone] Dept Fax: [phone]   No orders of the defined types were placed in this encounter.     CHIEF COMPLAINT:  CC: metastatic small bowel cancer   Current Treatment:  first line chemotherapy - CapeOx  INTERVAL HISTORY:  Linda [name] is here today for repeat clinical assessment.  She was last seen by Dr. Mosetta Putt on 09/17/2023.  Today, she reports some cold sensitivity with cold food.  This has not changed.  She reports good appetite.  Her weight has been stable. She denies chest pain, chest pressure, or shortness of breath. She denies headaches or visual disturbances. She denies abdominal pain, nausea, vomiting, or changes in bowel or bladder habits.   She denies fevers or chills. She denies pain.  I have reviewed the past medical history, past surgical history, social history and family history with the patient and they are unchanged from previous note.  ALLERGIES:  has no known allergies.  MEDICATIONS:  Current Outpatient Medications  Medication Sig Dispense Refill   acetaminophen (TYLENOL) 500 MG tablet Take 2 tablets (1,000 mg total) by mouth every 8 (eight) hours as needed for mild pain or fever.     amoxicillin-clavulanate (AUGMENTIN) 875-125 MG  tablet Take 1 tablet by mouth every 12 (twelve) hours for 7 days 14 tablet 0   capecitabine (XELODA) 500 MG tablet Take 3 tablets (1,500 mg total) by mouth 2 (two) times daily after a meal. Take within 30 minutes after meal. Take for 7 days on, then 7 days off, repeat every 14 days. 84 tablet 0   diclofenac Sodium (VOLTAREN) 1 % GEL Research Patient: Apply 0.5 grams (1 fingertip)  to each hand and each foot twice daily for up to 12 weeks 400 g 0   Lacosamide 100 MG TABS Take 1 tablet (100 mg total) by mouth 2 (two) times daily. 60 tablet 3   levETIRAcetam (KEPPRA) 1000 MG tablet Take 1 tablet (1,000 mg total) by mouth 2 (two) times daily. 120 tablet 0   levETIRAcetam (KEPPRA) 1000 MG tablet Take 1 tablet (1,000 mg total) by mouth 2 (two) times daily. 60 tablet 0   LORazepam (ATIVAN) 1 MG tablet Take 1 tablet (1 mg total) by mouth every 6 (six) hours as needed for anxiety. 15 tablet 0   methocarbamol (ROBAXIN) 500 MG tablet Take 2 tablets (1,000 mg total) by mouth every 8 (eight) hours as needed for muscle spasms. 30 tablet 0   nitroGLYCERIN (NITROSTAT) 0.4 MG SL tablet Place 1 tablet (0.4 mg total) under the tongue every 5 (five) minutes as needed for chest pain. 25 tablet 0   oxyCODONE (OXY IR/ROXICODONE) 5 MG immediate release tablet Take 1-2 tablets (5-10 mg total) by mouth every 6 (six) hours as needed for moderate pain or severe pain (5mg  moderate, 10mg  severe). (Patient taking differently: Take 5 mg by mouth every 6 (six) hours as needed for moderate pain (pain score 4-6) or severe pain (pain score 7-10).) 60 tablet 0   polyethylene glycol powder (GLYCOLAX/MIRALAX) 17 GM/SCOOP powder Take 17 g by mouth daily mixed in liquid as directed. 238 g 0   simethicone (MYLICON) 80 MG chewable tablet Chew 1 tablet (80 mg total) by mouth 4 (four) times daily as needed for flatulence. 100 tablet 0   No current facility-administered medications for this visit.    HISTORY OF PRESENT ILLNESS:   Oncology History  Cancer of ileum with carcinomatosis  07/10/2023 Initial Diagnosis   Cancer of ileum with carcinomatosis   07/22/2023 Cancer Staging   Staging form: Small Intestine - Adenocarcinoma, AJCC 8th Edition - Pathologic: Stage IV (pT4, pNX, pM1) - Signed by Malachy Mood, MD on 07/22/2023 Histologic grade (G): G2 Histologic grading system: 4 grade system   07/31/2023 - 08/16/2023  Chemotherapy   Patient is on Treatment Plan : COLORECTAL FOLFOX q14d     08/28/2023 -  Chemotherapy   Patient is on Treatment Plan : COLORECTAL CAPEOX (130/850) q21d x 8 cycles         REVIEW OF SYSTEMS:   Constitutional: Denies fevers, chills or abnormal weight loss Eyes: Denies blurriness of vision Ears, nose, mouth, throat, and face: Denies mucositis or sore throat Respiratory: Denies cough, dyspnea or wheezes Cardiovascular: Denies palpitation, chest discomfort or lower extremity swelling Gastrointestinal:  Denies nausea, heartburn or change in bowel habits Skin: Denies abnormal skin rashes Lymphatics: Denies new lymphadenopathy or easy bruising Neurological:Denies numbness, tingling or new weaknesses Behavioral/Psych: Mood is stable, no new changes  All other systems were reviewed with the patient and are negative.   VITALS:   Today's Vitals   10/09/23 1315  BP: 139/80  Pulse: 82  Resp: 17  Temp: 97.9 F (36.6 C)  TempSrc: Oral  SpO2: 99%  Weight: 187 lb 6.4 oz (85 kg)   Body mass index is 32.17 kg/m.   Wt Readings from Last 3 Encounters:  10/09/23 187 lb 6.4 oz (85 kg)  10/09/23 187 lb 4 oz (84.9 kg)  09/17/23 180 lb 6.4 oz (81.8 kg)    Body mass index is 32.17 kg/m.  Performance status (ECOG): 1 - Symptomatic but completely ambulatory  PHYSICAL EXAM:   GENERAL:alert, no distress and comfortable SKIN: skin color, texture, turgor are normal, no rashes or significant lesions EYES: normal, Conjunctiva are pink and non-injected, sclera clear OROPHARYNX:no exudate, no erythema and lips, buccal mucosa, and tongue normal  NECK: supple, thyroid normal size, non-tender, without nodularity LYMPH:  no palpable lymphadenopathy in the cervical, axillary or inguinal LUNGS: clear to auscultation and percussion with normal breathing effort HEART: regular rate & rhythm and no murmurs and no lower extremity edema ABDOMEN:abdomen soft, non-tender and normal bowel  sounds Musculoskeletal:no cyanosis of digits and no clubbing  NEURO: alert & oriented x 3 with fluent speech, no focal motor/sensory deficits  LABORATORY DATA:  I have reviewed the data as listed    Component Value Date/Time   NA 140 10/09/2023 1214   NA 141 05/16/2022 1044   K 3.5 10/09/2023 1214   CL 105 10/09/2023 1214   CO2 28 10/09/2023 1214   GLUCOSE 106 (H) 10/09/2023 1214   BUN 6 10/09/2023 1214   BUN 9 05/16/2022 1044   CREATININE 0.83 10/09/2023 1214   CALCIUM 9.3 10/09/2023 1214   PROT 7.9 10/09/2023 1214   PROT 7.8 05/16/2022 1044   ALBUMIN 3.8 10/09/2023 1214   ALBUMIN 3.5 (L) 08/03/2023 1515   AST 21 10/09/2023 1214   AST 15 08/14/2023 0829   ALT 15 10/09/2023 1214   ALT 13 08/14/2023 0829   ALKPHOS 83 10/09/2023 1214   BILITOT 0.3 10/09/2023 1214   BILITOT 0.3 08/14/2023 0829   GFRNONAA >60 10/09/2023 1214     Lab Results  Component Value Date   WBC 6.5 10/09/2023   NEUTROABS 3.3 10/09/2023   HGB 13.0 10/09/2023   HCT 39.6 10/09/2023   MCV 88.2 10/09/2023   PLT 327 10/09/2023

## 2023-10-09 NOTE — Patient Instructions (Signed)
 [facility] - A DEPT OF [facility]  Discharge Instructions: Thank you for choosing [facility] to provide your oncology and hematology care.   If you have a lab appointment with the Cancer Center, please go directly to the Cancer Center and check in at the registration area.   Wear comfortable clothing and clothing appropriate for easy access to any Portacath or PICC line.   We strive to give you quality time with your provider. You may need to reschedule your appointment if you arrive late (15 or more minutes).  Arriving late affects you and other patients whose appointments are after yours.  Also, if you miss three or more appointments without notifying the office, you may be dismissed from the clinic at the provider's discretion.      For prescription refill requests, have your pharmacy contact our office and allow 72 hours for refills to be completed.    Today you received the following chemotherapy and/or immunotherapy agents: Oxaliplatin.       To help prevent nausea and vomiting after your treatment, we encourage you to take your nausea medication as directed.  BELOW ARE SYMPTOMS THAT SHOULD BE REPORTED IMMEDIATELY: *FEVER GREATER THAN 100.4 F (38 C) OR HIGHER *CHILLS OR SWEATING *NAUSEA AND VOMITING THAT IS NOT CONTROLLED WITH YOUR NAUSEA MEDICATION *UNUSUAL SHORTNESS OF BREATH *UNUSUAL BRUISING OR BLEEDING *URINARY PROBLEMS (pain or burning when urinating, or frequent urination) *BOWEL PROBLEMS (unusual diarrhea, constipation, pain near the anus) TENDERNESS IN MOUTH AND THROAT WITH OR WITHOUT PRESENCE OF ULCERS (sore throat, sores in mouth, or a toothache) UNUSUAL RASH, SWELLING OR PAIN  UNUSUAL VAGINAL DISCHARGE OR ITCHING   Items with * indicate a potential emergency and should be followed up as soon as possible or go to the Emergency Department if any problems should occur.  Please show the CHEMOTHERAPY ALERT CARD or  IMMUNOTHERAPY ALERT CARD at check-in to the Emergency Department and triage nurse.  Should you have questions after your visit or need to cancel or reschedule your appointment, please contact [facility] - A [facility]  Dept: [phone]  and follow the prompts.  Office hours are 8:00 a.m. to 4:30 p.m. Monday - Friday. Please note that voicemails left after 4:00 p.m. may not be returned until the following business day.  We are closed weekends and major holidays. You have access to a nurse at all times for urgent questions. Please call the main number to the clinic Dept: [phone] and follow the prompts.   For any non-urgent questions, you may also contact your provider using MyChart. We now offer e-Visits for anyone 68 and older to request care online for non-urgent symptoms. For details visit [email or web address].   Also download the MyChart app! Go to the app store, search "MyChart", open the app, select [place], and log in with your MyChart username and password.

## 2023-10-10 ENCOUNTER — Inpatient Hospital Stay: Payer: BC Managed Care – PPO | Admitting: Licensed Clinical Social Worker

## 2023-10-10 DIAGNOSIS — C172 Malignant neoplasm of ileum: Secondary | ICD-10-CM

## 2023-10-10 NOTE — Progress Notes (Signed)
[facility] CSW Progress Note  Visual merchandiser  received an email from pt requesting a letter to present to her landlord as she has received court papers for eviction.  CSW attempted to phone pt with no answer.  CSW responded to email providing pt w/ contact information for the [employer] as they may be able to stop the eviction process on behalf of pt w/ emergency funds.  CSW also inquired if pt has exhausted the [employer].  In addition CSW included contact information for the PAF grant available until the end of the year.  CSW informed pt a letter can be provided to confirm diagnosis; however, pt will need to provide contact details to address letter.  CSW to remain available as appropriate to assist pt.        Rachel Moulds, LCSW Clinical Social Worker [facility]

## 2023-10-19 ENCOUNTER — Encounter: Payer: Self-pay | Admitting: Hematology

## 2023-10-19 ENCOUNTER — Encounter: Payer: Self-pay | Admitting: Nurse Practitioner

## 2023-10-19 NOTE — Assessment & Plan Note (Signed)
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  [name] in the liver MRI also showed a 1.8 cm oligo met in segment 7 of liver. -We discussed the incurable nature of her metastatic disease, due to her metastasis in peritoneum and liver.  We also discussed the small possibility of HIPEC and liver directed therapy if she has excellent response to chemotherapy. -First-line chemotherapy FOLFOX was done.  She started on July 31, 2023, oxaliplatin was held for first cycle due to open wound. -She developed seizure activity after first cycle of 5-FU infusion. She was evaluated by neuro oncologist Dr. Barbaraann Cao earlier this week, her seizure was felt to be unrelated to 5-FU. She felt that seizure was coming after cycle 2 chemo when she coming in for pump d/c and required ativan.  He has thus changed to CAPEOX. -10/09/2023 -today is cycle 3 day 1.

## 2023-10-20 ENCOUNTER — Encounter: Payer: Self-pay | Admitting: Hematology

## 2023-10-20 ENCOUNTER — Inpatient Hospital Stay: Payer: BC Managed Care – PPO | Admitting: Licensed Clinical Social Worker

## 2023-10-20 DIAGNOSIS — C172 Malignant neoplasm of ileum: Secondary | ICD-10-CM

## 2023-10-20 NOTE — Progress Notes (Signed)
[facility] CSW Progress Note  Visual merchandiser  spoke w/ pt regarding her eviction notice.  CSW received a call from Sophie at the [employer] who verbalized agreement to contact pt regarding the above and will assist if possible.  Pt informed and is awaiting a call from Sophie.    CSW to remain available as appropriate.      Rachel Moulds, LCSW Clinical Social Worker [facility]    Patient is participating in a Managed Medicaid Plan:  Yes

## 2023-10-21 ENCOUNTER — Other Ambulatory Visit: Payer: Self-pay

## 2023-10-24 ENCOUNTER — Other Ambulatory Visit: Payer: Self-pay

## 2023-10-27 ENCOUNTER — Encounter: Payer: Self-pay | Admitting: Hematology

## 2023-10-27 ENCOUNTER — Other Ambulatory Visit: Payer: Self-pay

## 2023-10-27 ENCOUNTER — Other Ambulatory Visit: Payer: Self-pay | Admitting: Hematology

## 2023-10-27 ENCOUNTER — Other Ambulatory Visit (HOSPITAL_COMMUNITY): Payer: Self-pay

## 2023-10-27 DIAGNOSIS — C172 Malignant neoplasm of ileum: Secondary | ICD-10-CM

## 2023-10-27 MED ORDER — CAPECITABINE 500 MG PO TABS
850.0000 mg/m2 | ORAL_TABLET | Freq: Two times a day (BID) | ORAL | 0 refills | Status: DC
  Filled 2023-10-27: qty 84, 14d supply, fill #0
  Filled 2023-10-28: qty 84, 28d supply, fill #0

## 2023-10-27 NOTE — Progress Notes (Signed)
 Specialty Pharmacy Refill Coordination Note  Linda [name] is a 58 y.o. female contacted today regarding refills of specialty medication(s) Capecitabine  (XELODA )   Patient requested Delivery   Delivery date: 11/03/23   Verified address: [address] [place], [place], [place] [address]   Medication will be filled on 01.10.25.

## 2023-10-27 NOTE — Progress Notes (Signed)
 Pending refill completed.

## 2023-10-28 ENCOUNTER — Encounter: Payer: Self-pay | Admitting: Hematology

## 2023-10-28 ENCOUNTER — Other Ambulatory Visit (HOSPITAL_COMMUNITY): Payer: Self-pay

## 2023-10-29 ENCOUNTER — Encounter: Payer: Self-pay | Admitting: Hematology

## 2023-10-29 ENCOUNTER — Ambulatory Visit: Payer: BC Managed Care – PPO

## 2023-10-29 ENCOUNTER — Other Ambulatory Visit: Payer: BC Managed Care – PPO

## 2023-10-29 ENCOUNTER — Ambulatory Visit: Payer: BC Managed Care – PPO | Admitting: Hematology

## 2023-10-29 NOTE — Assessment & Plan Note (Signed)
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  [name] in the liver MRI also showed a 1.8 cm oligo met in segment 7 of liver. -We discussed the incurable nature of her metastatic disease, due to her metastasis in peritoneum and liver.  We also discussed the small possibility of HIPEC and liver directed therapy if she has excellent response to chemotherapy. -I recommend first-line chemotherapy FOLFOX.  She started on July 31, 2023, oxaliplatin was held for first cycle due to open wound. -She developed seizure activity after first cycle of 5-FU infusion. She was evaluated by new oncologist Dr. Barbaraann Cao earlier this week, her seizure was felt to be unlikely related to 5-FU. She felt that seizure was coming after cycle 2 chemo when she coming in for pump d/c and required ativan. I will change her cehmo to CAPOX

## 2023-10-30 ENCOUNTER — Encounter: Payer: Self-pay | Admitting: Nurse Practitioner

## 2023-10-30 ENCOUNTER — Ambulatory Visit: Payer: Medicaid Other | Admitting: Nurse Practitioner

## 2023-10-30 ENCOUNTER — Inpatient Hospital Stay (HOSPITAL_BASED_OUTPATIENT_CLINIC_OR_DEPARTMENT_OTHER): Payer: Managed Care, Other (non HMO) | Admitting: Hematology

## 2023-10-30 ENCOUNTER — Inpatient Hospital Stay: Payer: Managed Care, Other (non HMO) | Attending: Hematology

## 2023-10-30 ENCOUNTER — Encounter: Payer: Self-pay | Admitting: Hematology

## 2023-10-30 ENCOUNTER — Other Ambulatory Visit (HOSPITAL_COMMUNITY): Payer: Self-pay

## 2023-10-30 ENCOUNTER — Inpatient Hospital Stay: Payer: Managed Care, Other (non HMO)

## 2023-10-30 VITALS — BP 157/95 | HR 70 | Resp 16

## 2023-10-30 VITALS — BP 156/87 | HR 74 | Temp 97.9°F | Resp 15 | Wt 191.4 lb

## 2023-10-30 VITALS — BP 140/92 | HR 100 | Temp 98.4°F | Ht 64.0 in | Wt 192.0 lb

## 2023-10-30 DIAGNOSIS — C786 Secondary malignant neoplasm of retroperitoneum and peritoneum: Secondary | ICD-10-CM | POA: Diagnosis not present

## 2023-10-30 DIAGNOSIS — E669 Obesity, unspecified: Secondary | ICD-10-CM

## 2023-10-30 DIAGNOSIS — I10 Essential (primary) hypertension: Secondary | ICD-10-CM | POA: Diagnosis not present

## 2023-10-30 DIAGNOSIS — R569 Unspecified convulsions: Secondary | ICD-10-CM | POA: Diagnosis not present

## 2023-10-30 DIAGNOSIS — E1169 Type 2 diabetes mellitus with other specified complication: Secondary | ICD-10-CM | POA: Diagnosis not present

## 2023-10-30 DIAGNOSIS — C172 Malignant neoplasm of ileum: Secondary | ICD-10-CM

## 2023-10-30 DIAGNOSIS — C787 Secondary malignant neoplasm of liver and intrahepatic bile duct: Secondary | ICD-10-CM | POA: Insufficient documentation

## 2023-10-30 DIAGNOSIS — Z2821 Immunization not carried out because of patient refusal: Secondary | ICD-10-CM

## 2023-10-30 DIAGNOSIS — C179 Malignant neoplasm of small intestine, unspecified: Secondary | ICD-10-CM | POA: Insufficient documentation

## 2023-10-30 DIAGNOSIS — Z5111 Encounter for antineoplastic chemotherapy: Secondary | ICD-10-CM | POA: Insufficient documentation

## 2023-10-30 DIAGNOSIS — C189 Malignant neoplasm of colon, unspecified: Secondary | ICD-10-CM

## 2023-10-30 DIAGNOSIS — Z79899 Other long term (current) drug therapy: Secondary | ICD-10-CM | POA: Insufficient documentation

## 2023-10-30 DIAGNOSIS — D500 Iron deficiency anemia secondary to blood loss (chronic): Secondary | ICD-10-CM

## 2023-10-30 DIAGNOSIS — E7800 Pure hypercholesterolemia, unspecified: Secondary | ICD-10-CM

## 2023-10-30 DIAGNOSIS — Z95828 Presence of other vascular implants and grafts: Secondary | ICD-10-CM

## 2023-10-30 LAB — BASIC METABOLIC PANEL - CANCER CENTER ONLY
Anion gap: 8 (ref 5–15)
BUN: 8 mg/dL (ref 6–20)
CO2: 26 mmol/L (ref 22–32)
Calcium: 9.4 mg/dL (ref 8.9–10.3)
Chloride: 105 mmol/L (ref 98–111)
Creatinine: 0.73 mg/dL (ref 0.44–1.00)
GFR, Estimated: >60 mL/min (ref >60)
Glucose, Bld: 95 mg/dL (ref 70–99)
Potassium: 3.5 mmol/L (ref 3.5–5.1)
Sodium: 139 mmol/L (ref 135–145)

## 2023-10-30 LAB — CBC WITH DIFFERENTIAL/PLATELET
Abs Immature Granulocytes: 0.01 10*3/uL (ref 0.00–0.07)
Basophils Absolute: 0 10*3/uL (ref 0.0–0.1)
Basophils Relative: 0 %
Eosinophils Absolute: 0 10*3/uL (ref 0.0–0.5)
Eosinophils Relative: 1 %
HCT: 40.5 % (ref 36.0–46.0)
Hemoglobin: 13.3 g/dL (ref 12.0–15.0)
Immature Granulocytes: 0 %
Lymphocytes Relative: 38 %
Lymphs Abs: 2.5 10*3/uL (ref 0.7–4.0)
MCH: 29.5 pg (ref 26.0–34.0)
MCHC: 32.8 g/dL (ref 30.0–36.0)
MCV: 89.8 fL (ref 80.0–100.0)
Monocytes Absolute: 0.6 10*3/uL (ref 0.1–1.0)
Monocytes Relative: 10 %
Neutro Abs: 3.3 10*3/uL (ref 1.7–7.7)
Neutrophils Relative %: 51 %
Platelets: 295 10*3/uL (ref 150–400)
RBC: 4.51 MIL/uL (ref 3.87–5.11)
RDW: 17.2 % — ABNORMAL HIGH (ref 11.5–15.5)
WBC: 6.4 10*3/uL (ref 4.0–10.5)
nRBC: 0 % (ref 0.0–0.2)

## 2023-10-30 LAB — HEPATIC FUNCTION PANEL
ALT: 12 U/L (ref 0–44)
AST: 20 U/L (ref 15–41)
Albumin: 3.9 g/dL (ref 3.5–5.0)
Alkaline Phosphatase: 120 U/L (ref 38–126)
Bilirubin, Direct: <0.1 mg/dL (ref 0.0–0.2)
Total Bilirubin: 0.4 mg/dL (ref 0.0–1.2)
Total Protein: 7.9 g/dL (ref 6.5–8.1)

## 2023-10-30 LAB — PREGNANCY, URINE: Preg Test, Ur: NEGATIVE

## 2023-10-30 LAB — FERRITIN: Ferritin: 126 ng/mL (ref 11–307)

## 2023-10-30 MED ORDER — DEXTROSE 5 % IV SOLN
65.0000 mg/m2 | Freq: Once | INTRAVENOUS | Status: AC
  Administered 2023-10-30: 125 mg via INTRAVENOUS
  Filled 2023-10-30: qty 5

## 2023-10-30 MED ORDER — PALONOSETRON HCL INJECTION 0.25 MG/5ML
0.2500 mg | Freq: Once | INTRAVENOUS | Status: AC
  Administered 2023-10-30: 0.25 mg via INTRAVENOUS
  Filled 2023-10-30: qty 5

## 2023-10-30 MED ORDER — SODIUM CHLORIDE 0.9% FLUSH
10.0000 mL | Freq: Once | INTRAVENOUS | Status: AC
  Administered 2023-10-30: 10 mL

## 2023-10-30 MED ORDER — DEXTROSE 5 % IV SOLN
INTRAVENOUS | Status: DC
Start: 2023-10-30 — End: 2023-10-30

## 2023-10-30 MED ORDER — DEXAMETHASONE SODIUM PHOSPHATE 10 MG/ML IJ SOLN
10.0000 mg | Freq: Once | INTRAMUSCULAR | Status: AC
  Administered 2023-10-30: 10 mg via INTRAVENOUS
  Filled 2023-10-30: qty 1

## 2023-10-30 MED ORDER — SODIUM CHLORIDE 0.9% FLUSH
10.0000 mL | INTRAVENOUS | Status: DC | PRN
  Administered 2023-10-30: 10 mL

## 2023-10-30 MED ORDER — HEPARIN SOD (PORK) LOCK FLUSH 100 UNIT/ML IV SOLN
500.0000 [IU] | Freq: Once | INTRAVENOUS | Status: AC | PRN
Start: 2023-10-30 — End: 2023-10-30
  Administered 2023-10-30: 500 [IU]

## 2023-10-30 MED ORDER — VALSARTAN 40 MG PO TABS
40.0000 mg | ORAL_TABLET | Freq: Every day | ORAL | 2 refills | Status: AC
  Filled 2023-10-30: qty 90, 90d supply, fill #0
  Filled 2024-03-23: qty 90, 90d supply, fill #1

## 2023-10-30 NOTE — Assessment & Plan Note (Signed)
No recent seizure activity

## 2023-10-30 NOTE — Assessment & Plan Note (Signed)

## 2023-10-30 NOTE — Progress Notes (Signed)
 [facility]   [phone] [phone]   Clinic Follow up Note   Patient Care Team: Georgina Speaks, [name] as PCP - General (General Practice) Lanny Callander, MD as Consulting Physician (Medical Oncology) Signe Mitzie LABOR, MD as Consulting Physician (General Surgery) Curvin Deward MOULD, MD as Consulting Physician (General Surgery)  Date of Service:  10/30/2023  CHIEF COMPLAINT: f/u of metastatic small bowel cancer  CURRENT THERAPY:  First-line chemotherapy CapeOx with Xeloda  1000 mg twice daily on day 1-14, every 21 days  Oncology History   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  [name] in the liver MRI also showed a 1.8 cm oligo met in segment 7 of liver. -We discussed the incurable nature of her metastatic disease, due to her metastasis in peritoneum and liver.  We also discussed the small possibility of HIPEC and liver directed therapy if she has excellent response to chemotherapy. -I recommend first-line chemotherapy FOLFOX.  She started on July 31, 2023, oxaliplatin  was held for first cycle due to open wound. -She developed seizure activity after first cycle of 5-FU infusion. She was evaluated by new oncologist Dr. Buckley earlier this week, her seizure was felt to be unlikely related to 5-FU. She felt that seizure was coming after cycle 2 chemo when she coming in for pump d/c and required ativan . I will change her cehmo to CAPOX     Assessment and Plan    Metastatic Colon Cancer Follow-up for metastatic colon cancer. Tolerating chemotherapy well with no significant side effects. Noted chest pain likely due to heartburn, no shortness of breath, fever, or chills. Weight gain and decent energy levels. Normal blood counts and  negative urine test. Manageable cold sensitivity post-infusion. Discussed timing of antacids to avoid interference with chemotherapy absorption. - Continue chemotherapy every three weeks - Order CT scan around November 11, 2023 - Advise Pepcid  or Tums for heartburn, ensuring it is taken at least one hour before or two to three hours after chemotherapy pill - Schedule next infusion on November 20, 2023 - Follow up on November 20, 2023 for infusion and CT scan review  Heartburn Intermittent chest pain likely due to heartburn associated with oral chemotherapy. Managed with Ativan . Discussed use of Pepcid  or Tums and timing to avoid interference with chemotherapy absorption. - Advise Pepcid  or Tums for heartburn, ensuring it is taken at least one hour before or two to three hours after chemotherapy pill  Seizure  On Keppra  for seizure prophylaxis. No recent seizure activity. Confirmed refills available. - Continue Keppra  as prescribed - Ensure refills are available.     Plan -Lab reviewed, adequate for treatment, will proceed oxaliplatin  today at the same dose, he will continue Xeloda  at low-dose -Follow-up in 3 weeks before next cycle chemo, with restaging CT scan before visit    SUMMARY OF ONCOLOGIC HISTORY: Oncology History  Cancer of ileum with carcinomatosis  07/10/2023 Initial Diagnosis   Cancer of ileum with carcinomatosis   07/22/2023 Cancer Staging   Staging form: Small Intestine - Adenocarcinoma, AJCC 8th Edition - Pathologic: Stage IV (pT4, pNX, pM1) - Signed by Lanny Callander, MD on 07/22/2023 Histologic grade (G): G2 Histologic grading system: 4 grade system   07/31/2023 - 08/16/2023 Chemotherapy   Patient is on Treatment Plan : COLORECTAL FOLFOX q14d  08/28/2023 -  Chemotherapy   Patient is on Treatment Plan : COLORECTAL CAPEOX (130/850) q21d x 8 cycles        Discussed the use of AI scribe software for clinical note transcription with the patient, who gave verbal consent  to proceed.  History of Present Illness   The patient, a 58 year old female with metastatic colon cancer, presents for a follow-up visit. She reports tolerating her chemotherapy regimen well, with no seizure-like feelings or nausea. However, she has experienced chest pain twice, each episode lasting about twenty minutes, which she describes as a heartburn-like sensation. This discomfort occurs when she takes her oral chemotherapy medication. She manages this discomfort with Ativan , which effectively alleviates the pain. She denies any shortness of breath, fever, or chills. Her appetite is good, and she has gained some weight. Her energy levels are decent, although she notes that her chemotherapy regimen slows her down a bit. She is also taking Keppra , Ativan , and Robaxin  as needed.         All other systems were reviewed with the patient and are negative.  MEDICAL HISTORY:  Past Medical History:  Diagnosis Date   Abnormal uterine bleeding 04/10/2022   Anemia    Hypertension    Pre-diabetes 02/2022    SURGICAL HISTORY: Past Surgical History:  Procedure Laterality Date   BREAST LUMPECTOMY WITH RADIOACTIVE SEED LOCALIZATION Left 06/10/2022   Procedure: LEFT BREAST LUMPECTOMY WITH RADIOACTIVE SEED LOCALIZATION;  Surgeon: Curvin Deward MOULD, MD;  Location: [facility];  Service: General;  Laterality: Left;   CESAREAN SECTION     x4   IR IMAGING GUIDED PORT INSERTION  07/08/2023   LAPAROTOMY N/A 06/30/2023   Procedure: EXPLORATORY LAPAROTOMY, SMALL BOWEL RESECTION, AND EXCISIONAL BIOPSIES OF MULTIPLE OMENTAL MASSES;  Surgeon: Signe Mitzie LABOR, MD;  Location: [facility];  Service: General;  Laterality: N/A;   TUBAL LIGATION      I have reviewed the social history and family history with the patient and they are unchanged from previous note.  ALLERGIES:  has no known allergies.  MEDICATIONS:  Current Outpatient Medications  Medication Sig Dispense Refill   acetaminophen  (TYLENOL )  500 MG tablet Take 2 tablets (1,000 mg total) by mouth every 8 (eight) hours as needed for mild pain or fever.     capecitabine  (XELODA ) 500 MG tablet Take 3 tablets (1,500 mg total) by mouth 2 (two) times daily after a meal. Take within 30 minutes after meal. Take for 7 days on, then 7 days off, repeat every 14 days. 84 tablet 0   diclofenac  Sodium (VOLTAREN ) 1 % GEL Research Patient: Apply 0.5 grams (1 fingertip) to each hand and each foot twice daily for up to 12 weeks 400 g 0   Lacosamide  100 MG TABS Take 1 tablet (100 mg total) by mouth 2 (two) times daily. 60 tablet 3   levETIRAcetam  (KEPPRA ) 1000 MG tablet Take 1 tablet (1,000 mg total) by mouth 2 (two) times daily. 120 tablet 0   levETIRAcetam  (KEPPRA ) 1000 MG tablet Take 1 tablet (1,000 mg total) by mouth 2 (two) times daily. 60 tablet 0   LORazepam  (ATIVAN ) 1 MG tablet Take 1 tablet (1 mg total) by mouth every 6 (six) hours as needed for anxiety. 15 tablet 0   methocarbamol  (ROBAXIN ) 500 MG tablet Take 2 tablets (1,000 mg total) by mouth every 8 (eight) hours as needed for muscle spasms. 30 tablet 0   nitroGLYCERIN  (NITROSTAT ) 0.4 MG SL tablet Place 1 tablet (0.4 mg  total) under the tongue every 5 (five) minutes as needed for chest pain. 25 tablet 0   oxyCODONE  (OXY IR/ROXICODONE ) 5 MG immediate release tablet Take 1-2 tablets (5-10 mg total) by mouth every 6 (six) hours as needed for moderate pain or severe pain (5mg  moderate, 10mg  severe). (Patient taking differently: Take 5 mg by mouth every 6 (six) hours as needed for moderate pain (pain score 4-6) or severe pain (pain score 7-10).) 60 tablet 0   polyethylene glycol powder (GLYCOLAX /MIRALAX ) 17 GM/SCOOP powder Take 17 g by mouth daily mixed in liquid as directed. 238 g 0   simethicone  (MYLICON) 80 MG chewable tablet Chew 1 tablet (80 mg total) by mouth 4 (four) times daily as needed for flatulence. 100 tablet 0   No current facility-administered medications for this visit.    Facility-Administered Medications Ordered in Other Visits  Medication Dose Route Frequency Provider Last Rate Last Admin   dextrose  5 % solution   Intravenous Continuous Lanny Callander, MD 10 mL/hr at 10/30/23 0844 New Bag at 10/30/23 0844   heparin  lock flush 100 unit/mL  500 Units Intracatheter Once PRN Lanny Callander, MD       oxaliplatin  (ELOXATIN ) 125 mg in dextrose  5 % 500 mL chemo infusion  65 mg/m2 (Treatment Plan Recorded) Intravenous Once Lanny Callander, MD       sodium chloride  flush (NS) 0.9 % injection 10 mL  10 mL Intracatheter PRN Lanny Callander, MD        PHYSICAL EXAMINATION: ECOG PERFORMANCE STATUS: 1 - Symptomatic but completely ambulatory  Vitals:   10/30/23 0804 10/30/23 0807  BP: (!) 157/97 (!) 156/87  Pulse: 74   Resp: 15   Temp: 97.9 F (36.6 C)   SpO2: 100%    Wt Readings from Last 3 Encounters:  10/30/23 191 lb 6.4 oz (86.8 kg)  10/09/23 187 lb 6.4 oz (85 kg)  10/09/23 187 lb 4 oz (84.9 kg)     GENERAL:alert, no distress and comfortable SKIN: skin color, texture, turgor are normal, no rashes or significant lesions EYES: normal, Conjunctiva are pink and non-injected, sclera clear NECK: supple, thyroid  normal size, non-tender, without nodularity LYMPH:  no palpable lymphadenopathy in the cervical, axillary  LUNGS: clear to auscultation and percussion with normal breathing effort HEART: regular rate & rhythm and no murmurs and no lower extremity edema ABDOMEN:abdomen soft, non-tender and normal bowel sounds Musculoskeletal:no cyanosis of digits and no clubbing  NEURO: alert & oriented x 3 with fluent speech, no focal motor/sensory deficits   LABORATORY DATA:  I have reviewed the data as listed    Latest Ref Rng & Units 10/30/2023    7:43 AM 10/09/2023   12:14 PM 09/17/2023   11:08 AM  CBC  WBC 4.0 - 10.5 K/uL 6.4  6.5  5.9   Hemoglobin 12.0 - 15.0 g/dL 86.6  86.9  86.7   Hematocrit 36.0 - 46.0 % 40.5  39.6  40.4   Platelets 150 - 400 K/uL 295  327  255          Latest Ref Rng & Units 10/30/2023    7:43 AM 10/09/2023   12:14 PM 09/17/2023   11:08 AM  CMP  Glucose 70 - 99 mg/dL 95  893  869   BUN 6 - 20 mg/dL 8  6  <5   Creatinine 9.55 - 1.00 mg/dL 9.26  9.16  9.25   Sodium 135 - 145 mmol/L 139  140  138   Potassium 3.5 - 5.1  mmol/L 3.5  3.5  3.3   Chloride 98 - 111 mmol/L 105  105  104   CO2 22 - 32 mmol/L 26  28  26    Calcium  8.9 - 10.3 mg/dL 9.4  9.3  9.6   Total Protein 6.5 - 8.1 g/dL  7.9    Total Bilirubin <1.2 mg/dL  0.3    Alkaline Phos 38 - 126 U/L  83    AST 15 - 41 U/L  21    ALT 0 - 44 U/L  15        RADIOGRAPHIC STUDIES: I have personally reviewed the radiological images as listed and agreed with the findings in the report. No results found.    Orders Placed This Encounter  Procedures   CT CHEST ABDOMEN PELVIS W CONTRAST    Standing Status:   Future    Expected Date:   11/11/2023    Expiration Date:   10/29/2024    If indicated for the ordered procedure, I authorize the administration of contrast media per Radiology protocol:   Yes    Does the patient have a contrast media/X-ray dye allergy?:   Yes    Is patient pregnant?:   No    Preferred imaging location?:   [facility]    If indicated for the ordered procedure, I authorize the administration of oral contrast media per Radiology protocol:   Yes   CBC with Differential (Cancer Center Only)    Standing Status:   Future    Expected Date:   12/11/2023    Expiration Date:   12/10/2024   Basic Metabolic Panel - Cancer Center Only    Standing Status:   Future    Expected Date:   12/11/2023    Expiration Date:   12/10/2024   CBC with Differential (Cancer Center Only)    Standing Status:   Future    Expected Date:   01/01/2024    Expiration Date:   12/31/2024   Basic Metabolic Panel - Cancer Center Only    Standing Status:   Future    Expected Date:   01/01/2024    Expiration Date:   12/31/2024   CBC with Differential (Cancer Center Only)    Standing  Status:   Future    Expected Date:   01/22/2024    Expiration Date:   01/21/2025   Basic Metabolic Panel - Cancer Center Only    Standing Status:   Future    Expected Date:   01/22/2024    Expiration Date:   01/21/2025   All questions were answered. The patient knows to call the clinic with any problems, questions or concerns. No barriers to learning was detected. The total time spent in the appointment was 30 minutes.     Onita Mattock, MD 10/30/2023

## 2023-10-30 NOTE — Assessment & Plan Note (Signed)
 Hgba1c was normal at last visit. She will have her A1c checked when she gets labs again at the Oncologist, if increases will consider starting Ozempic, she will confirm if Oncology feels she can take this medication.

## 2023-10-30 NOTE — Patient Instructions (Signed)
 [facility] - A DEPT OF [facility]  Discharge Instructions: Thank you for choosing [facility] to provide your oncology and hematology care.   If you have a lab appointment with the Cancer Center, please go directly to the Cancer Center and check in at the registration area.   Wear comfortable clothing and clothing appropriate for easy access to any Portacath or PICC line.   We strive to give you quality time with your provider. You may need to reschedule your appointment if you arrive late (15 or more minutes).  Arriving late affects you and other patients whose appointments are after yours.  Also, if you miss three or more appointments without notifying the office, you may be dismissed from the clinic at the provider's discretion.      For prescription refill requests, have your pharmacy contact our office and allow 72 hours for refills to be completed.    Today you received the following chemotherapy and/or immunotherapy agents: Oxaliplatin      To help prevent nausea and vomiting after your treatment, we encourage you to take your nausea medication as directed.  BELOW ARE SYMPTOMS THAT SHOULD BE REPORTED IMMEDIATELY: *FEVER GREATER THAN 100.4 F (38 C) OR HIGHER *CHILLS OR SWEATING *NAUSEA AND VOMITING THAT IS NOT CONTROLLED WITH YOUR NAUSEA MEDICATION *UNUSUAL SHORTNESS OF BREATH *UNUSUAL BRUISING OR BLEEDING *URINARY PROBLEMS (pain or burning when urinating, or frequent urination) *BOWEL PROBLEMS (unusual diarrhea, constipation, pain near the anus) TENDERNESS IN MOUTH AND THROAT WITH OR WITHOUT PRESENCE OF ULCERS (sore throat, sores in mouth, or a toothache) UNUSUAL RASH, SWELLING OR PAIN  UNUSUAL VAGINAL DISCHARGE OR ITCHING   Items with * indicate a potential emergency and should be followed up as soon as possible or go to the Emergency Department if any problems should occur.  Please show the CHEMOTHERAPY ALERT CARD or  IMMUNOTHERAPY ALERT CARD at check-in to the Emergency Department and triage nurse.  Should you have questions after your visit or need to cancel or reschedule your appointment, please contact [facility] - [facility]  Dept: [phone]  and follow the prompts.  Office hours are 8:00 a.m. to 4:30 p.m. Monday - Friday. Please note that voicemails left after 4:00 p.m. may not be returned until the following business day.  We are closed weekends and major holidays. You have access to a nurse at all times for urgent questions. Please call the main number to the clinic Dept: [phone] and follow the prompts.   For any non-urgent questions, you may also contact your provider using MyChart. We now offer e-Visits for anyone 36 and older to request care online for non-urgent symptoms. For details visit [email or web address].   Also download the MyChart app! Go to the app store, search "MyChart", open the app, select [place], and log in with your MyChart username and password.

## 2023-10-30 NOTE — Progress Notes (Signed)
 Linda [name], CMA,acting as a neurosurgeon for Linda [name], [name].,have documented all relevant documentation on the behalf of Linda [name], [name],as directed by  Linda [name], [name] while in the presence of Linda [name], [name].  Subjective:  Patient ID: Linda [name] , female    DOB: 1966/04/10 , 58 y.o.   MRN: [id number]  Chief Complaint  Patient presents with   Hypertension    HPI  Patient presents today for a bp and dm follow up, Patient reports compliance with medication. Patient denies any chest pain, SOB, or headaches. Patient has no concerns today. She had chemo this morning which is about 4-5 hours. She is tolerating the chemo better. The infusion is every 3 weeks and the oral chemo is every other week for 7 days. She is due for a CT on 11/11/23.   She continues to be out of work. She works for [name] work. She is not allowed to drive right now per Dr. Buckley.      Past Medical History:  Diagnosis Date   Abnormal uterine bleeding 04/10/2022   Anemia    Hypertension    Pre-diabetes 02/2022     Family History  Problem Relation Age of Onset   Memory loss Mother    Hypertension Father    Hypertension Brother    Diabetes Maternal Grandmother    Hypertension Maternal Grandmother    Hypertension Maternal Grandfather    Colon cancer Neg Hx    Stomach cancer Neg Hx    Esophageal cancer Neg Hx      Current Outpatient Medications:    acetaminophen  (TYLENOL ) 500 MG tablet, Take 2 tablets (1,000 mg total) by mouth every 8 (eight) hours as needed for mild pain or fever., Disp: , Rfl:    capecitabine  (XELODA ) 500 MG tablet, Take 3 tablets (1,500 mg total) by mouth 2 (two) times daily after a meal. Take within 30 minutes after meal. Take for 7 days on, then 7 days off, repeat every 14 days., Disp: 84 tablet, Rfl: 0   diclofenac  Sodium (VOLTAREN ) 1 % GEL, Research Patient: Apply 0.5 grams (1 fingertip) to each hand and each foot twice daily for up to 12 weeks, Disp: 400 g, Rfl: 0    Lacosamide  100 MG TABS, Take 1 tablet (100 mg total) by mouth 2 (two) times daily., Disp: 60 tablet, Rfl: 3   levETIRAcetam  (KEPPRA ) 1000 MG tablet, Take 1 tablet (1,000 mg total) by mouth 2 (two) times daily., Disp: 120 tablet, Rfl: 0   LORazepam  (ATIVAN ) 1 MG tablet, Take 1 tablet (1 mg total) by mouth every 6 (six) hours as needed for anxiety., Disp: 15 tablet, Rfl: 0   methocarbamol  (ROBAXIN ) 500 MG tablet, Take 2 tablets (1,000 mg total) by mouth every 8 (eight) hours as needed for muscle spasms., Disp: 30 tablet, Rfl: 0   nitroGLYCERIN  (NITROSTAT ) 0.4 MG SL tablet, Place 1 tablet (0.4 mg total) under the tongue every 5 (five) minutes as needed for chest pain., Disp: 25 tablet, Rfl: 0   oxyCODONE  (OXY IR/ROXICODONE ) 5 MG immediate release tablet, Take 1-2 tablets (5-10 mg total) by mouth every 6 (six) hours as needed for moderate pain or severe pain (5mg  moderate, 10mg  severe). (Patient taking differently: Take 5 mg by mouth every 6 (six) hours as needed for moderate pain (pain score 4-6) or severe pain (pain score 7-10).), Disp: 60 tablet, Rfl: 0   polyethylene glycol powder (GLYCOLAX /MIRALAX ) 17 GM/SCOOP powder, Take 17 g by mouth daily mixed in liquid  as directed., Disp: 238 g, Rfl: 0   simethicone  (MYLICON) 80 MG chewable tablet, Chew 1 tablet (80 mg total) by mouth 4 (four) times daily as needed for flatulence., Disp: 100 tablet, Rfl: 0   levETIRAcetam  (KEPPRA ) 1000 MG tablet, Take 1 tablet (1,000 mg total) by mouth 2 (two) times daily., Disp: 60 tablet, Rfl: 0   valsartan  (DIOVAN ) 40 MG tablet, Take 1 tablet (40 mg total) by mouth daily., Disp: 90 tablet, Rfl: 2   No Known Allergies   Review of Systems  Constitutional: Negative.   Respiratory: Negative.    Cardiovascular: Negative.   Neurological: Negative.   Psychiatric/Behavioral: Negative.    All other systems reviewed and are negative.    Today's Vitals   10/30/23 1542 10/30/23 1640  BP: (!) 150/100 (!) 140/92  Pulse: 100    Temp: 98.4 F (36.9 C)   TempSrc: Oral   Weight: 192 lb (87.1 kg)   Height: 5' 4 (1.626 m)   PainSc: 0-No pain    Body mass index is 32.96 kg/m.  Wt Readings from Last 3 Encounters:  11/11/23 192 lb 9.6 oz (87.4 kg)  10/30/23 192 lb (87.1 kg)  10/30/23 191 lb 6.4 oz (86.8 kg)    The 10-year ASCVD risk score (Arnett DK, et al., 2019) is: 36.6%   Values used to calculate the score:     Age: 23 years     Sex: Female     Is Non-Hispanic African American: Yes     Diabetic: Yes     Tobacco smoker: No     Systolic Blood Pressure: 171 mmHg     Is BP treated: Yes     HDL Cholesterol: 48 mg/dL     Total Cholesterol: 234 mg/dL  Objective:  Physical Exam Vitals reviewed.  Constitutional:      General: She is not in acute distress.    Appearance: Normal appearance. She is obese.  Cardiovascular:     Rate and Rhythm: Normal rate and regular rhythm.     Pulses: Normal pulses.     Heart sounds: Normal heart sounds. No murmur heard. Pulmonary:     Effort: Pulmonary effort is normal. No respiratory distress.     Breath sounds: Normal breath sounds. No wheezing.  Neurological:     Mental Status: She is alert.     Assessment And Plan:  Type 2 diabetes mellitus with obesity (HCC) Assessment & Plan: Hgba1c was normal at last visit. She will have her A1c checked when she gets labs again at the Oncologist, if increases will consider starting Ozempic , she will confirm if Oncology feels she can take this medication.  Orders: -     Hemoglobin A1c; Future  Uncontrolled hypertension Assessment & Plan: Blood pressure has been increased, has not been on her blood pressure medications since discharge from the hospital. Will restart her Valsartan  at 40 mg daily may need to increase if BP not improved. Also discussed the importance of cutting back on her salt intake.   Orders: -     Valsartan ; Take 1 tablet (40 mg total) by mouth daily.  Dispense: 90 tablet; Refill: 2  Peritoneal  carcinomatosis Linda [name]) Assessment & Plan: Continue f/u with Oncology   Colon adenocarcinoma Linda [name]) Assessment & Plan: Continue f/u with Oncology   Seizures (HCC) Assessment & Plan: No recent seizure activity.    Elevated cholesterol Assessment & Plan: She will have her labs checked with her next blood draw at Oncology. Continue low fat diet.  Orders: -     Lipid panel; Future  COVID-19 vaccination declined Assessment & Plan: Declines covid 19 vaccine. Discussed risk of covid 59 and if she changes her mind about the vaccine to call the office. Education has been provided regarding the importance of this vaccine but patient still declined. Advised may receive this vaccine at local pharmacy or Health Dept.or vaccine clinic. Aware to provide a copy of the vaccination record if obtained from local pharmacy or Health Dept.  Encouraged to take multivitamin, vitamin d , vitamin c and zinc to increase immune [name]. Aware can call office if would like to have vaccine here at office. Verbalized acceptance and understanding.      Return for controlled DM check 4 months.  Patient was given opportunity to ask questions. Patient verbalized understanding of the plan and was able to repeat key elements of the plan. All questions were answered to their satisfaction.    Linda Linda [name], [name], have reviewed all documentation for this visit. The documentation on 10/30/23 for the exam, diagnosis, procedures, and orders are all accurate and complete.   IF YOU HAVE BEEN REFERRED TO A SPECIALIST, IT MAY TAKE 1-2 WEEKS TO SCHEDULE/PROCESS THE REFERRAL. IF YOU HAVE NOT HEARD FROM US /SPECIALIST IN TWO WEEKS, PLEASE GIVE US  A CALL AT [phone] X [phone].

## 2023-10-30 NOTE — Assessment & Plan Note (Signed)
 Continue f/u with Oncology

## 2023-10-30 NOTE — Assessment & Plan Note (Signed)
 Blood pressure has been increased, has not been on her blood pressure medications since discharge from the hospital. Will restart her Valsartan  at 40 mg daily may need to increase if BP not improved. Also discussed the importance of cutting back on her salt intake.

## 2023-10-30 NOTE — Patient Instructions (Signed)
 Check your blood pressure daily and if greater than 140/80 call to office if more than 3 days.

## 2023-10-31 ENCOUNTER — Other Ambulatory Visit: Payer: Self-pay

## 2023-11-02 ENCOUNTER — Other Ambulatory Visit: Payer: Self-pay | Admitting: Internal Medicine

## 2023-11-03 ENCOUNTER — Other Ambulatory Visit: Payer: Self-pay

## 2023-11-03 ENCOUNTER — Other Ambulatory Visit (HOSPITAL_COMMUNITY): Payer: Self-pay

## 2023-11-03 MED ORDER — LEVETIRACETAM 1000 MG PO TABS
1000.0000 mg | ORAL_TABLET | Freq: Two times a day (BID) | ORAL | 0 refills | Status: DC
  Filled 2023-11-03: qty 60, 30d supply, fill #0

## 2023-11-04 ENCOUNTER — Other Ambulatory Visit (HOSPITAL_COMMUNITY): Payer: Self-pay

## 2023-11-05 ENCOUNTER — Other Ambulatory Visit (HOSPITAL_COMMUNITY): Payer: Self-pay

## 2023-11-10 ENCOUNTER — Telehealth: Payer: Self-pay

## 2023-11-10 NOTE — Telephone Encounter (Signed)
Patient notified that her [employer] long term disability documents document had ben completed and faxed back to company on 11/05/23. Fax confirmation received. Request for records faxed to [facility]. Copy of documents emailed to patient per request

## 2023-11-11 ENCOUNTER — Inpatient Hospital Stay (HOSPITAL_BASED_OUTPATIENT_CLINIC_OR_DEPARTMENT_OTHER): Payer: Managed Care, Other (non HMO) | Admitting: Internal Medicine

## 2023-11-11 ENCOUNTER — Encounter: Payer: Self-pay | Admitting: Nurse Practitioner

## 2023-11-11 ENCOUNTER — Ambulatory Visit (HOSPITAL_COMMUNITY)
Admission: RE | Admit: 2023-11-11 | Discharge: 2023-11-11 | Disposition: A | Discharge status: Home / Self Care | Payer: Managed Care, Other (non HMO) | Source: Ambulatory Visit | Attending: Hematology | Admitting: Hematology

## 2023-11-11 VITALS — BP 171/93 | HR 73 | Temp 97.6°F | Resp 14 | Wt 192.6 lb

## 2023-11-11 DIAGNOSIS — Z5111 Encounter for antineoplastic chemotherapy: Secondary | ICD-10-CM | POA: Diagnosis not present

## 2023-11-11 DIAGNOSIS — C787 Secondary malignant neoplasm of liver and intrahepatic bile duct: Secondary | ICD-10-CM | POA: Diagnosis not present

## 2023-11-11 DIAGNOSIS — C179 Malignant neoplasm of small intestine, unspecified: Secondary | ICD-10-CM

## 2023-11-11 DIAGNOSIS — C172 Malignant neoplasm of ileum: Secondary | ICD-10-CM | POA: Insufficient documentation

## 2023-11-11 DIAGNOSIS — R569 Unspecified convulsions: Secondary | ICD-10-CM | POA: Diagnosis not present

## 2023-11-11 MED ORDER — IOHEXOL 300 MG/ML  SOLN
30.0000 mL | Freq: Once | INTRAMUSCULAR | Status: AC | PRN
  Administered 2023-11-11: 30 mL via ORAL

## 2023-11-11 MED ORDER — HEPARIN SOD (PORK) LOCK FLUSH 100 UNIT/ML IV SOLN
500.0000 [IU] | Freq: Once | INTRAVENOUS | Status: AC
  Administered 2023-11-11: 500 [IU] via INTRAVENOUS

## 2023-11-11 MED ORDER — HEPARIN SOD (PORK) LOCK FLUSH 100 UNIT/ML IV SOLN
INTRAVENOUS | Status: AC
  Filled 2023-11-11: qty 5

## 2023-11-11 MED ORDER — IOHEXOL 300 MG/ML  SOLN
100.0000 mL | Freq: Once | INTRAMUSCULAR | Status: AC | PRN
  Administered 2023-11-11: 100 mL via INTRAVENOUS

## 2023-11-11 NOTE — Progress Notes (Signed)
[facility] [address]  [place], [place] [address] [phone]   Interval Evaluation  Date of Service: 11/11/23 Patient Name: Linda [name] Patient MRN: [id number] Patient DOB: Jun 17, 1966 Provider: Henreitta Leber, MD  Identifying Statement:  Linda [name] is a 58 y.o. 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  She has been seizure free since the post-infusion event in October.  Currently on oxilaplatin and capecitabine without significant issue.  Continues on Keppra and Vimpat without any reported side effects.  H+P (08/12/23) Patient presents to review recent neurologic episodes.  She and her daughter describe sudden onset right facial twitching, speech impairment, followed by twitching of all limbs, amnesia.  This was followed by confusion, lasting for almost a day.  Seizure episode occurred ~2 days after infusion of 5-Fu for peritoneal carcinomatosis.  The event recurred following admission to the hospital, similar semiology.  Patient has only faint recollection of each event.  She underwent brain MRI, long term EEG, lumbar puncture, was started on both Keppra and Vimpat. Since admission, she reports feeling at her baseline with no breakthrough seizures.  Positive family history  of seizures in both mother and brother.  No other notable epilepsy risk factors     Medications: Current Outpatient Medications on File Prior to Visit  Medication Sig Dispense Refill   acetaminophen (TYLENOL) 500 MG tablet Take 2 tablets (1,000 mg total) by mouth every 8 (eight) hours as needed for mild pain or fever.     capecitabine (XELODA) 500 MG tablet Take 3 tablets (1,500 mg total) by mouth 2 (two) times daily after a meal. Take within 30 minutes after meal. Take for 7 days on, then 7 days off, repeat every 14 days. 84 tablet 0   diclofenac Sodium (VOLTAREN) 1 % GEL Research Patient: Apply 0.5 grams (1 fingertip) to each hand and each foot twice daily for up to 12 weeks 400 g 0   Lacosamide 100 MG TABS Take 1 tablet (100 mg total) by mouth 2 (two) times daily. 60 tablet 3   levETIRAcetam (KEPPRA) 1000 MG tablet Take 1 tablet (1,000 mg total) by mouth 2 (two) times daily. 120 tablet 0   levETIRAcetam (KEPPRA) 1000 MG tablet Take 1 tablet (1,000 mg total) by mouth 2 (two) times daily. 60 tablet 0   LORazepam (ATIVAN) 1 MG tablet Take 1 tablet (1 mg total) by mouth every 6 (six) hours as needed for anxiety. 15 tablet 0   methocarbamol (ROBAXIN) 500 MG tablet Take 2 tablets (1,000 mg total) by mouth every 8 (eight) hours as needed for muscle spasms. 30 tablet 0   nitroGLYCERIN (NITROSTAT) 0.4 MG SL tablet Place 1 tablet (0.4 mg total) under the tongue every 5 (five) minutes as needed for chest pain.  25 tablet 0   oxyCODONE (OXY IR/ROXICODONE) 5 MG immediate release tablet Take 1-2 tablets (5-10 mg total) by mouth every 6 (six) hours as needed for moderate pain or severe pain (5mg  moderate, 10mg  severe). (Patient taking differently: Take 5 mg by mouth every 6 (six) hours as needed for moderate pain (pain score 4-6) or severe pain (pain score 7-10).) 60 tablet 0   polyethylene glycol powder (GLYCOLAX/MIRALAX) 17 GM/SCOOP powder Take 17 g by mouth daily mixed in liquid as directed. 238 g 0    simethicone (MYLICON) 80 MG chewable tablet Chew 1 tablet (80 mg total) by mouth 4 (four) times daily as needed for flatulence. 100 tablet 0   valsartan (DIOVAN) 40 MG tablet Take 1 tablet (40 mg total) by mouth daily. 90 tablet 2   No current facility-administered medications on file prior to visit.    Allergies: No Known Allergies Past Medical History:  Past Medical History:  Diagnosis Date   Abnormal uterine bleeding 04/10/2022   Anemia    Hypertension    Pre-diabetes 02/2022   Past Surgical History:  Past Surgical History:  Procedure Laterality Date   BREAST LUMPECTOMY WITH RADIOACTIVE SEED LOCALIZATION Left 06/10/2022   Procedure: LEFT BREAST LUMPECTOMY WITH RADIOACTIVE SEED LOCALIZATION;  Surgeon: Griselda Miner, MD;  Location: [facility];  Service: General;  Laterality: Left;   CESAREAN SECTION     x4   IR IMAGING GUIDED PORT INSERTION  07/08/2023   LAPAROTOMY N/A 06/30/2023   Procedure: EXPLORATORY LAPAROTOMY, SMALL BOWEL RESECTION, AND EXCISIONAL BIOPSIES OF MULTIPLE OMENTAL MASSES;  Surgeon: Berna Bue, MD;  Location: [facility];  Service: General;  Laterality: N/A;   TUBAL LIGATION     Social History:  Social History   Socioeconomic History   Marital status: Single    Spouse name: Not on file   Number of children: 4   Years of education: Not on file   Highest education level: Some college, no degree  Occupational History    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  Tobacco Use   Smoking status: Never   Smokeless tobacco: Never  Vaping Use   Vaping status: Never Used  Substance and Sexual Activity   Alcohol use: No   Drug use: No   Sexual activity: Not Currently    Birth control/protection: Surgical  Other Topics Concern   Not on file  Social History Narrative   Not on file   Social Drivers of Health   Financial Resource Strain: Medium Risk (10/29/2023)   Overall Financial Resource Strain (CARDIA)    Difficulty of Paying Living  Expenses: Somewhat hard  Food Insecurity: Patient Declined (10/29/2023)   Hunger Vital Sign    Worried About Running Out of Food in the Last Year: Patient declined    Ran Out of Food in the Last Year: Patient declined  Transportation Needs: No Transportation Needs (10/29/2023)   PRAPARE - Administrator, Civil Service (Medical): No    Lack of Transportation (Non-Medical): No  Physical Activity: Inactive (10/29/2023)   Exercise Vital Sign    Days of Exercise per Week: 0 days    Minutes of Exercise per Session: 0 min  Stress: No Stress Concern Present (10/29/2023)   [employer] of Occupational Health - Occupational Stress Questionnaire    Feeling of Stress : Only a little  Social Connections: Moderately Isolated (10/29/2023)   Social Connection and Isolation Panel [NHANES]    Frequency of Communication with Friends and Family: More  than three times a week    Frequency of Social Gatherings with Friends and Family: More than three times a week    Attends Religious Services: 1 to 4 times per year    Active Member of [employer] or Organizations: No    Attends Engineer, structural: Not on file    Marital Status: Never married  Intimate Partner Violence: Not At Risk (06/20/2023)   Humiliation, Afraid, Rape, and Kick questionnaire    Fear of Current or Ex-Partner: No    Emotionally Abused: No    Physically Abused: No    Sexually Abused: No   Family History:  Family History  Problem Relation Age of Onset   Memory loss Mother    Hypertension Father    Hypertension Brother    Diabetes Maternal Grandmother    Hypertension Maternal Grandmother    Hypertension Maternal Grandfather    Colon cancer Neg Hx    Stomach cancer Neg Hx    Esophageal cancer Neg Hx     Review of Systems: Constitutional: Doesn't report fevers, chills or abnormal weight loss Eyes: Doesn't report blurriness of vision Ears, nose, mouth, throat, and face: Doesn't report sore throat Respiratory: Doesn't  report cough, dyspnea or wheezes Cardiovascular: Doesn't report palpitation, chest discomfort  Gastrointestinal:  Doesn't report nausea, constipation, diarrhea GU: Doesn't report incontinence Skin: Doesn't report skin rashes Neurological: Per HPI Musculoskeletal: Doesn't report joint pain Behavioral/Psych: Doesn't report anxiety  Physical Exam: Vitals:   11/11/23 1021  BP: (!) 171/93  Pulse: 73  Resp: 14  Temp: 97.6 F (36.4 C)  SpO2: 100%    KPS: 90. General: Alert, cooperative, pleasant, in no acute distress Head: Normal EENT: No conjunctival injection or scleral icterus.  Lungs: Resp effort normal Cardiac: Regular rate Abdomen: Non-distended abdomen Skin: No rashes cyanosis or petechiae. Extremities: No clubbing or edema  Neurologic Exam: Mental Status: Awake, alert, attentive to examiner. Oriented to self and environment. Language is fluent with intact comprehension.  Cranial Nerves: Visual acuity is grossly normal. Visual fields are full. Extra-ocular movements intact. No ptosis. Face is symmetric Motor: Tone and bulk are normal. Power is full in both arms and legs. Reflexes are symmetric, no pathologic reflexes present.  Sensory: Intact to light touch Gait: Normal.   Labs: I have reviewed the data as listed    Component Value Date/Time   NA 139 10/30/2023 0743   NA 141 05/16/2022 1044   K 3.5 10/30/2023 0743   CL 105 10/30/2023 0743   CO2 26 10/30/2023 0743   GLUCOSE 95 10/30/2023 0743   BUN 8 10/30/2023 0743   BUN 9 05/16/2022 1044   CREATININE 0.73 10/30/2023 0743   CALCIUM 9.4 10/30/2023 0743   PROT 7.9 10/30/2023 0743   PROT 7.8 05/16/2022 1044   ALBUMIN 3.9 10/30/2023 0743   ALBUMIN 3.5 (L) 08/03/2023 1515   AST 20 10/30/2023 0743   AST 15 08/14/2023 0829   ALT 12 10/30/2023 0743   ALT 13 08/14/2023 0829   ALKPHOS 120 10/30/2023 0743   BILITOT 0.4 10/30/2023 0743   BILITOT 0.3 08/14/2023 0829   GFRNONAA >60 10/30/2023 0743   Lab Results   Component Value Date   WBC 6.4 10/30/2023   NEUTROABS 3.3 10/30/2023   HGB 13.3 10/30/2023   HCT 40.5 10/30/2023   MCV 89.8 10/30/2023   PLT 295 10/30/2023    Assessment/Plan Seizures (HCC)  Linda [name] presents with clinical syndrome c/w focal seizures.  Etiology of seizures is unclear, family  history is noteworthy.  Given close association with 5-Fu infusion, now discontinued, it would be reasonable to taper some of the AED burden.  No concerns with current chemotherapy regimen.  Given some mood effects, will decrease Keppra to 500mg  BID x7 days, then stop.  Vimpat will continue at 100mg  BID as prior.    She may return to driving in April if remains seizure free.  We appreciate the opportunity to participate in the care of Linda [name].  We ask that Linda [name] return to clinic in 6 months, or sooner as needed.   All questions were answered. The patient knows to call the clinic with any problems, questions or concerns. No barriers to learning were detected.  The total time spent in the encounter was 30 minutes and more than 50% was on counseling and review of test results   Henreitta Leber, MD Medical Director of Neuro-Oncology [facility] 11/11/23 10:15 AM

## 2023-11-11 NOTE — Assessment & Plan Note (Signed)
Continue f/u with Oncology.

## 2023-11-11 NOTE — Assessment & Plan Note (Signed)
She will have her labs checked with her next blood draw at Oncology. Continue low fat diet.

## 2023-11-12 ENCOUNTER — Telehealth: Payer: Self-pay | Admitting: Internal Medicine

## 2023-11-12 NOTE — Telephone Encounter (Signed)
 [name]

## 2023-11-13 ENCOUNTER — Other Ambulatory Visit: Payer: Self-pay

## 2023-11-13 ENCOUNTER — Inpatient Hospital Stay: Payer: Managed Care, Other (non HMO) | Admitting: Licensed Clinical Social Worker

## 2023-11-13 DIAGNOSIS — C172 Malignant neoplasm of ileum: Secondary | ICD-10-CM

## 2023-11-13 NOTE — Progress Notes (Signed)
[facility] CSW Progress Note  Clinical Child psychotherapist  received a call from Onalee Hua [phone]) who informed CSW that he was able to have pt's court date extended to January 29th.  Onalee Hua inquiring if pt can request a Conservation officer, historic buildings from her landlord as well as compose a Manufacturing engineer.  CSW called pt to relay this information and provided pt w/ direct contact number for Onalee Hua.  CSW to remain available as appropriate to provide support throughout duration of treatment.        Rachel Moulds, LCSW Clinical Social Worker [facility]    Patient is participating in a Managed Medicaid Plan:  Yes

## 2023-11-14 ENCOUNTER — Inpatient Hospital Stay: Payer: Managed Care, Other (non HMO) | Admitting: Licensed Clinical Social Worker

## 2023-11-14 DIAGNOSIS — C172 Malignant neoplasm of ileum: Secondary | ICD-10-CM

## 2023-11-14 NOTE — Progress Notes (Signed)
[facility] CSW Progress Note  Visual merchandiser  received a call from [place] from [employer] [place] who informed CSW he is having trouble obtaining the necessary documents to be able to move forward w/ paying pt's rent.  Theodoro Grist inquired if there would be anything this CSW could do to obtain the payment ledger and W-9 from pt's landlord.  CSW suggested Theodoro Grist have pt and the landlord on a conference call to expedite getting the needed documents.  CSW to remain available as appropriate to provide support.        Rachel Moulds, LCSW Clinical Social Worker [facility]    Patient is participating in a Managed Medicaid Plan:  Yes

## 2023-11-17 ENCOUNTER — Telehealth: Payer: Self-pay

## 2023-11-17 ENCOUNTER — Encounter: Payer: Self-pay | Admitting: Nurse Practitioner

## 2023-11-17 NOTE — Telephone Encounter (Signed)
Spoke with pt via telephone regarding voicemail message lft on this nurse's phone stating pt's daughter has been dx w/FluA and the pt's PCP now want to prescribe the pt preventative medication since the pt's daughter lives in the house w/the pt.  Pt wanted to know if this is OK.  Informed pt that "Yes" it's OK for the pt to f/u w/PCP for preventative medication for FluA.  Stated if it's a vaccine, please make sure the vaccine is not of live virus.  Instructed the pt to start taking Mucinex D, Robitussin DM, and double masking when caring for her daughter.  Pt verbalized understanding and stated she will contact her PCP to schedule an appt.

## 2023-11-18 ENCOUNTER — Other Ambulatory Visit (HOSPITAL_COMMUNITY): Payer: Self-pay

## 2023-11-18 ENCOUNTER — Other Ambulatory Visit: Payer: Self-pay | Admitting: Nurse Practitioner

## 2023-11-18 ENCOUNTER — Encounter: Payer: Self-pay | Admitting: Hematology

## 2023-11-18 MED ORDER — OSELTAMIVIR PHOSPHATE 75 MG PO CAPS
75.0000 mg | ORAL_CAPSULE | Freq: Two times a day (BID) | ORAL | 0 refills | Status: AC
  Filled 2023-11-18: qty 10, 5d supply, fill #0

## 2023-11-19 ENCOUNTER — Telehealth: Payer: Self-pay

## 2023-11-19 NOTE — Telephone Encounter (Signed)
Patient called in stating her care giver has the flu. She has started having symptoms cough, diarrhea, stuffy head no fever at this time. She wen to her PCP and got a dose of Tamiflu, she started it yesterday. I spoke with Dr. Georga Bora nurse about this because the patient wanted to know if she should come in for her treatment tomorrow. Olegario Shearer stated that we will change her appointment to a phone app and reschedule her infusion. Made patient aware will send a message to scheduling to have it rescheduled.

## 2023-11-19 NOTE — Assessment & Plan Note (Addendum)
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  [name] in the liver MRI also showed a 1.8 cm oligo met in segment 7 of liver. -We discussed the incurable nature of her metastatic disease, due to her metastasis in peritoneum and liver.  We also discussed the small possibility of HIPEC and liver directed therapy if she has excellent response to chemotherapy. -I recommend first-line chemotherapy FOLFOX.  She started on July 31, 2023, oxaliplatin was held for first cycle due to open wound. -She developed seizure activity after first cycle of 5-FU infusion. She was evaluated by new oncologist Dr. Barbaraann Cao, her seizure was felt to be unlikely related to 5-FU. She felt that seizure was coming after cycle 2 chemo when she coming in for pump d/c and required ativan. I changed her cehmo to CAPOX on 08/28/2023. She required dose reduction  -restaging CT 11/11/2023 showed mixed response with improved liver met and slightly worse peritoneal mets

## 2023-11-20 ENCOUNTER — Encounter: Payer: Self-pay | Admitting: Hematology

## 2023-11-20 ENCOUNTER — Inpatient Hospital Stay (HOSPITAL_BASED_OUTPATIENT_CLINIC_OR_DEPARTMENT_OTHER): Payer: Managed Care, Other (non HMO) | Admitting: Hematology

## 2023-11-20 ENCOUNTER — Inpatient Hospital Stay: Payer: Managed Care, Other (non HMO)

## 2023-11-20 DIAGNOSIS — C172 Malignant neoplasm of ileum: Secondary | ICD-10-CM | POA: Diagnosis not present

## 2023-11-20 NOTE — Progress Notes (Signed)
[facility]   [phone]) [phone] Fax:([phone]   Clinic Follow up Note   Patient Care Team: Arnette Felts, [name] as PCP - General (General Practice) Malachy Mood, MD as Consulting Physician (Medical Oncology) Berna Bue, MD as Consulting Physician (General Surgery) Griselda Miner, MD as Consulting Physician (General Surgery) 11/20/2023  I connected with Linda [name] on 11/20/23 at 10:00 AM EST by telephone and verified that I am speaking with the correct person using two identifiers.   I discussed the limitations, risks, security and privacy concerns of performing an evaluation and management service by telephone and the availability of in person appointments. I also discussed with the patient that there may be a patient responsible charge related to this service. The patient expressed understanding and agreed to proceed.   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  [name] in the liver MRI also showed a 1.8 cm oligo met in segment 7 of liver. -We discussed the incurable nature of her metastatic disease, due to her metastasis in peritoneum and liver.  We also discussed the small possibility of HIPEC and liver directed therapy if she has excellent response to chemotherapy. -I recommend first-line chemotherapy FOLFOX.  She started on July 31, 2023, oxaliplatin was held for first cycle due to open wound. -She developed seizure activity after first cycle of 5-FU infusion. She was evaluated by new oncologist Dr. Barbaraann Cao, her seizure was felt to be unlikely related to 5-FU. She felt that seizure was coming after  cycle 2 chemo when she coming in for pump d/c and required ativan. I changed her cehmo to CAPOX on 08/28/2023. She required dose reduction  -restaging CT 11/11/2023 showed mixed response with improved liver met and slightly worse peritoneal mets       Assessment and Plan    Pelvic Lesion CT scan from November 11, 2023, shows a 10-15% increase in the size of the deep pelvic lesion, likely due to cancer progression or post-surgical changes. This mild progression warrants a slight increase in chemotherapy dosage. Patient consents to this adjustment. - Continue current treatment regimen - Increase oral Xeloda to 2 tablets in the morning and 3 tablets in the evening starting next week.  7 days on and 7 days of - Administer intravenous oxaliplatin every two weeks starting next Wednesday -Schedule chemo today was canceled due to her flu  Seizure Management No recent seizures reported. Currently on a single anti-seizure medication after discontinuing Keppra. Previous Xeloda dosage increase led to tremors resembling seizure activity. Patient agrees to a modified dosage schedule to mitigate these effects. - Monitor for seizure activity with increased Xeloda dosage - Adjust anti-seizure medication if necessary  General Health Maintenance Patient reports improving flu-like symptoms. Preventative medication prescribed by primary doctor has been effective. Treatment postponed to next week to allow full recovery from flu. - Monitor for new or worsening symptoms - Postpone treatment to next week for full recovery from flu  Plan -CT scan reviewed, mild progression in peritoneal metastasis -She will start next cycle of Xeloda on February 3, and slightly increased dose to 2 tablets in the morning and 3 tablets in the evening, 7 days  on and 7 days off -Follow-up on February 5 for office visit and chemotherapy, will change oxaliplatin to every 2 weeks at the same dose       SUMMARY OF ONCOLOGIC  HISTORY: Oncology History  Cancer of ileum with carcinomatosis  07/10/2023 Initial Diagnosis   Cancer of ileum with carcinomatosis   07/22/2023 Cancer Staging   Staging form: Small Intestine - Adenocarcinoma, AJCC 8th Edition - Pathologic: Stage IV (pT4, pNX, pM1) - Signed by Malachy Mood, MD on 07/22/2023 Histologic grade (G): G2 Histologic grading system: 4 grade system   07/31/2023 - 08/16/2023 Chemotherapy   Patient is on Treatment Plan : COLORECTAL FOLFOX q14d     08/28/2023 -  Chemotherapy   Patient is on Treatment Plan : COLORECTAL CAPEOX (130/850) q21d x 8 cycles       Discussed the use of AI scribe software for clinical note transcription with the patient, who gave verbal consent to proceed.  History of Present Illness   Linda [name], a 58 year old female with a history of cancer, presents for a phone visit to review a recent CT scan and discuss treatment. She reports experiencing flu-like symptoms, including a headache and cough, but does not believe she has the flu. Her primary doctor has prescribed preventative medication, which she has been taking for the past three days. She reports feeling better, with the headache resolved, but the cough persists.  Linda [name] is currently on a regimen of low-dose intravenous and oral chemotherapy for her cancer. She reports no issues with the infusion and has not experienced any seizures or tremor-like activity since starting the medication. She also reports having pain, but not in the stomach. Linda [name]'s treatment has been adjusted in the past due to side effects, including tremors, when on a higher dose of the oral chemotherapy.         REVIEW OF SYSTEMS:   Constitutional: Denies fevers, chills or abnormal weight loss Eyes: Denies blurriness of vision Ears, nose, mouth, throat, and face: Denies mucositis or sore throat Respiratory: Denies cough, dyspnea or wheezes Cardiovascular: Denies palpitation, chest discomfort or lower extremity  swelling Gastrointestinal:  Denies nausea, heartburn or change in bowel habits Skin: Denies abnormal skin rashes Lymphatics: Denies new lymphadenopathy or easy bruising Neurological:Denies numbness, tingling or new weaknesses Behavioral/Psych: Mood is stable, no new changes  All other systems were reviewed with the patient and are negative.  MEDICAL HISTORY:  Past Medical History:  Diagnosis Date   Abnormal uterine bleeding 04/10/2022   Anemia    Hypertension    Pre-diabetes 02/2022    SURGICAL HISTORY: Past Surgical History:  Procedure Laterality Date   BREAST LUMPECTOMY WITH RADIOACTIVE SEED LOCALIZATION Left 06/10/2022   Procedure: LEFT BREAST LUMPECTOMY WITH RADIOACTIVE SEED LOCALIZATION;  Surgeon: Griselda Miner, MD;  Location: [facility];  Service: General;  Laterality: Left;   CESAREAN SECTION     x4   IR IMAGING GUIDED PORT INSERTION  07/08/2023   LAPAROTOMY N/A 06/30/2023   Procedure: EXPLORATORY LAPAROTOMY, SMALL BOWEL RESECTION, AND EXCISIONAL BIOPSIES OF MULTIPLE OMENTAL MASSES;  Surgeon: Berna Bue, MD;  Location: [facility];  Service: General;  Laterality: N/A;   TUBAL LIGATION      I have reviewed the social history and family history with the patient and they are unchanged from previous note.  ALLERGIES:  has no known allergies.  MEDICATIONS:  Current Outpatient Medications  Medication Sig Dispense Refill   acetaminophen (TYLENOL) 500 MG tablet Take 2 tablets (1,000 mg total) by  mouth every 8 (eight) hours as needed for mild pain or fever.     capecitabine (XELODA) 500 MG tablet Take 3 tablets (1,500 mg total) by mouth 2 (two) times daily after a meal. Take within 30 minutes after meal. Take for 7 days on, then 7 days off, repeat every 14 days. 84 tablet 0   diclofenac Sodium (VOLTAREN) 1 % GEL Research Patient: Apply 0.5 grams (1 fingertip) to each hand and each foot twice daily for up to 12 weeks 400 g 0   Lacosamide 100 MG TABS Take 1 tablet  (100 mg total) by mouth 2 (two) times daily. 60 tablet 3   levETIRAcetam (KEPPRA) 1000 MG tablet Take 1 tablet (1,000 mg total) by mouth 2 (two) times daily. 120 tablet 0   levETIRAcetam (KEPPRA) 1000 MG tablet Take 1 tablet (1,000 mg total) by mouth 2 (two) times daily. 60 tablet 0   LORazepam (ATIVAN) 1 MG tablet Take 1 tablet (1 mg total) by mouth every 6 (six) hours as needed for anxiety. 15 tablet 0   methocarbamol (ROBAXIN) 500 MG tablet Take 2 tablets (1,000 mg total) by mouth every 8 (eight) hours as needed for muscle spasms. 30 tablet 0   nitroGLYCERIN (NITROSTAT) 0.4 MG SL tablet Place 1 tablet (0.4 mg total) under the tongue every 5 (five) minutes as needed for chest pain. 25 tablet 0   oseltamivir (TAMIFLU) 75 MG capsule Take 1 capsule (75 mg total) by mouth 2 (two) times daily for 5 days. 20 capsule 0   oxyCODONE (OXY IR/ROXICODONE) 5 MG immediate release tablet Take 1-2 tablets (5-10 mg total) by mouth every 6 (six) hours as needed for moderate pain or severe pain (5mg  moderate, 10mg  severe). (Patient taking differently: Take 5 mg by mouth every 6 (six) hours as needed for moderate pain (pain score 4-6) or severe pain (pain score 7-10).) 60 tablet 0   polyethylene glycol powder (GLYCOLAX/MIRALAX) 17 GM/SCOOP powder Take 17 g by mouth daily mixed in liquid as directed. 238 g 0   simethicone (MYLICON) 80 MG chewable tablet Chew 1 tablet (80 mg total) by mouth 4 (four) times daily as needed for flatulence. 100 tablet 0   valsartan (DIOVAN) 40 MG tablet Take 1 tablet (40 mg total) by mouth daily. 90 tablet 2   No current facility-administered medications for this visit.    PHYSICAL EXAMINATION: Not performed   LABORATORY DATA:  I have reviewed the data as listed    Latest Ref Rng & Units 10/30/2023    7:43 AM 10/09/2023   12:14 PM 09/17/2023   11:08 AM  CBC  WBC 4.0 - 10.5 K/uL 6.4  6.5  5.9   Hemoglobin 12.0 - 15.0 g/dL 16.1  09.6  04.5   Hematocrit 36.0 - 46.0 % 40.5  39.6  40.4    Platelets 150 - 400 K/uL 295  327  255         Latest Ref Rng & Units 10/30/2023    7:43 AM 10/09/2023   12:14 PM 09/17/2023   11:08 AM  CMP  Glucose 70 - 99 mg/dL 95  409  811   BUN 6 - 20 mg/dL 8  6  <5   Creatinine 9.14 - 1.00 mg/dL 7.82  9.56  2.13   Sodium 135 - 145 mmol/L 139  140  138   Potassium 3.5 - 5.1 mmol/L 3.5  3.5  3.3   Chloride 98 - 111 mmol/L 105  105  104  CO2 22 - 32 mmol/L 26  28  26    Calcium 8.9 - 10.3 mg/dL 9.4  9.3  9.6   Total Protein 6.5 - 8.1 g/dL 7.9  7.9    Total Bilirubin 0.0 - 1.2 mg/dL 0.4  0.3    Alkaline Phos 38 - 126 U/L 120  83    AST 15 - 41 U/L 20  21    ALT 0 - 44 U/L 12  15        RADIOGRAPHIC STUDIES: I have personally reviewed the radiological images as listed and agreed with the findings in the report. No results found.     I discussed the assessment and treatment plan with the patient. The patient was provided an opportunity to ask questions and all were answered. The patient agreed with the plan and demonstrated an understanding of the instructions.   The patient was advised to call back or seek an in-person evaluation if the symptoms worsen or if the condition fails to improve as anticipated.  I provided 22 minutes of non face-to-face telephone visit time during this encounter, and > 50% was spent counseling as documented under my assessment & plan.     Malachy Mood, MD 11/20/23

## 2023-11-24 ENCOUNTER — Other Ambulatory Visit: Payer: Self-pay

## 2023-11-25 ENCOUNTER — Other Ambulatory Visit (HOSPITAL_COMMUNITY): Payer: Self-pay

## 2023-11-27 ENCOUNTER — Inpatient Hospital Stay: Payer: Managed Care, Other (non HMO) | Attending: Hematology

## 2023-11-27 ENCOUNTER — Inpatient Hospital Stay: Payer: Managed Care, Other (non HMO)

## 2023-11-27 ENCOUNTER — Inpatient Hospital Stay: Payer: Managed Care, Other (non HMO) | Admitting: Nurse Practitioner

## 2023-12-01 ENCOUNTER — Other Ambulatory Visit: Payer: Self-pay

## 2023-12-02 ENCOUNTER — Inpatient Hospital Stay: Payer: Managed Care, Other (non HMO) | Attending: Hematology | Admitting: Licensed Clinical Social Worker

## 2023-12-02 DIAGNOSIS — C172 Malignant neoplasm of ileum: Secondary | ICD-10-CM | POA: Insufficient documentation

## 2023-12-02 DIAGNOSIS — C787 Secondary malignant neoplasm of liver and intrahepatic bile duct: Secondary | ICD-10-CM | POA: Insufficient documentation

## 2023-12-02 DIAGNOSIS — Z79899 Other long term (current) drug therapy: Secondary | ICD-10-CM | POA: Insufficient documentation

## 2023-12-02 DIAGNOSIS — C786 Secondary malignant neoplasm of retroperitoneum and peritoneum: Secondary | ICD-10-CM | POA: Insufficient documentation

## 2023-12-02 DIAGNOSIS — Z5111 Encounter for antineoplastic chemotherapy: Secondary | ICD-10-CM | POA: Insufficient documentation

## 2023-12-02 NOTE — Progress Notes (Signed)
[facility] CSW Progress Note  Clinical Social Worker  informed pt of referral to [employer] to apply for financial assistance.  CSW to remain available as appropriate throughout duration of treatment to provide support.        Rachel Moulds, LCSW Clinical Social Worker [facility]    Patient is participating in a Managed Medicaid Plan:  Yes

## 2023-12-03 ENCOUNTER — Inpatient Hospital Stay: Payer: Managed Care, Other (non HMO)

## 2023-12-03 ENCOUNTER — Encounter: Payer: Self-pay | Admitting: Hematology

## 2023-12-03 ENCOUNTER — Inpatient Hospital Stay: Payer: Managed Care, Other (non HMO) | Admitting: Hematology

## 2023-12-03 ENCOUNTER — Other Ambulatory Visit: Payer: Self-pay | Admitting: Nurse Practitioner

## 2023-12-03 ENCOUNTER — Other Ambulatory Visit (HOSPITAL_COMMUNITY): Payer: Self-pay

## 2023-12-03 VITALS — BP 167/79 | HR 85 | Resp 16

## 2023-12-03 VITALS — BP 159/84 | HR 82 | Temp 97.6°F | Resp 18 | Wt 196.9 lb

## 2023-12-03 DIAGNOSIS — C787 Secondary malignant neoplasm of liver and intrahepatic bile duct: Secondary | ICD-10-CM | POA: Diagnosis present

## 2023-12-03 DIAGNOSIS — Z5111 Encounter for antineoplastic chemotherapy: Secondary | ICD-10-CM | POA: Diagnosis not present

## 2023-12-03 DIAGNOSIS — C786 Secondary malignant neoplasm of retroperitoneum and peritoneum: Secondary | ICD-10-CM | POA: Diagnosis not present

## 2023-12-03 DIAGNOSIS — C172 Malignant neoplasm of ileum: Secondary | ICD-10-CM | POA: Diagnosis present

## 2023-12-03 DIAGNOSIS — Z95828 Presence of other vascular implants and grafts: Secondary | ICD-10-CM

## 2023-12-03 DIAGNOSIS — Z79899 Other long term (current) drug therapy: Secondary | ICD-10-CM | POA: Diagnosis not present

## 2023-12-03 LAB — CEA (ACCESS): CEA (CHCC): 33.73 ng/mL — ABNORMAL HIGH (ref 0.00–5.00)

## 2023-12-03 LAB — CBC WITH DIFFERENTIAL (CANCER CENTER ONLY)
Abs Immature Granulocytes: 0.04 10*3/uL (ref 0.00–0.07)
Basophils Absolute: 0 10*3/uL (ref 0.0–0.1)
Basophils Relative: 1 %
Eosinophils Absolute: 0.1 10*3/uL (ref 0.0–0.5)
Eosinophils Relative: 1 %
HCT: 41.4 % (ref 36.0–46.0)
Hemoglobin: 13.6 g/dL (ref 12.0–15.0)
Immature Granulocytes: 1 %
Lymphocytes Relative: 32 %
Lymphs Abs: 2.7 10*3/uL (ref 0.7–4.0)
MCH: 30.2 pg (ref 26.0–34.0)
MCHC: 32.9 g/dL (ref 30.0–36.0)
MCV: 92 fL (ref 80.0–100.0)
Monocytes Absolute: 0.8 10*3/uL (ref 0.1–1.0)
Monocytes Relative: 9 %
Neutro Abs: 4.9 10*3/uL (ref 1.7–7.7)
Neutrophils Relative %: 56 %
Platelet Count: 337 10*3/uL (ref 150–400)
RBC: 4.5 MIL/uL (ref 3.87–5.11)
RDW: 16 % — ABNORMAL HIGH (ref 11.5–15.5)
WBC Count: 8.6 10*3/uL (ref 4.0–10.5)
nRBC: 0 % (ref 0.0–0.2)

## 2023-12-03 LAB — BASIC METABOLIC PANEL - CANCER CENTER ONLY
Anion gap: 7 (ref 5–15)
BUN: 11 mg/dL (ref 6–20)
CO2: 27 mmol/L (ref 22–32)
Calcium: 9.2 mg/dL (ref 8.9–10.3)
Chloride: 103 mmol/L (ref 98–111)
Creatinine: 0.77 mg/dL (ref 0.44–1.00)
GFR, Estimated: >60 mL/min (ref >60)
Glucose, Bld: 96 mg/dL (ref 70–99)
Potassium: 3.7 mmol/L (ref 3.5–5.1)
Sodium: 137 mmol/L (ref 135–145)

## 2023-12-03 LAB — PREGNANCY, URINE: Preg Test, Ur: NEGATIVE

## 2023-12-03 MED ORDER — HEPARIN SOD (PORK) LOCK FLUSH 100 UNIT/ML IV SOLN
500.0000 [IU] | Freq: Once | INTRAVENOUS | Status: DC
Start: 2023-12-03 — End: 2023-12-03

## 2023-12-03 MED ORDER — DEXTROSE 5 % IV SOLN: INTRAVENOUS | Status: DC

## 2023-12-03 MED ORDER — OXALIPLATIN CHEMO INJECTION 100 MG/20ML
85.0000 mg/m2 | Freq: Once | INTRAVENOUS | Status: AC
  Administered 2023-12-03: 150 mg via INTRAVENOUS
  Filled 2023-12-03: qty 10

## 2023-12-03 MED ORDER — SODIUM CHLORIDE 0.9% FLUSH
10.0000 mL | INTRAVENOUS | Status: DC | PRN
Start: 2023-12-03 — End: 2023-12-03
  Administered 2023-12-03: 10 mL

## 2023-12-03 MED ORDER — PALONOSETRON HCL INJECTION 0.25 MG/5ML
0.2500 mg | Freq: Once | INTRAVENOUS | Status: AC
  Administered 2023-12-03: 0.25 mg via INTRAVENOUS
  Filled 2023-12-03: qty 5

## 2023-12-03 MED ORDER — SODIUM CHLORIDE 0.9% FLUSH
10.0000 mL | Freq: Once | INTRAVENOUS | Status: AC
  Administered 2023-12-03: 10 mL

## 2023-12-03 MED ORDER — HEPARIN SOD (PORK) LOCK FLUSH 100 UNIT/ML IV SOLN
500.0000 [IU] | Freq: Once | INTRAVENOUS | Status: AC | PRN
  Administered 2023-12-03: 500 [IU]

## 2023-12-03 MED ORDER — DEXAMETHASONE SODIUM PHOSPHATE 10 MG/ML IJ SOLN
10.0000 mg | Freq: Once | INTRAMUSCULAR | Status: AC
  Administered 2023-12-03: 10 mg via INTRAVENOUS
  Filled 2023-12-03: qty 1

## 2023-12-03 NOTE — Progress Notes (Signed)
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  [name] in the liver MRI also showed a 1.8 cm oligo met in segment 7 of liver. -We discussed the incurable nature of her metastatic disease, due to her metastasis in peritoneum and liver.  We also discussed the small possibility of HIPEC and liver directed therapy if she has excellent response to chemotherapy. -I recommend first-line chemotherapy FOLFOX.  She started on July 31, 2023, oxaliplatin was held for first cycle due to open wound. -She developed seizure activity after first cycle of 5-FU infusion. She was evaluated by new oncologist Dr. Barbaraann Cao, her seizure was felt to be unlikely related to 5-FU. She felt that seizure was coming after cycle 2 chemo when she coming in for pump d/c and required ativan. I changed her cehmo to CAPOX on 08/28/2023. 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 Currently on low-dose chemotherapy with oral Xeloda and IV chemotherapy. Reports no new symptoms, good appetite,  and energy levels, with some fatigue. Weight gain noted, indicating good nutritional intake. Blood counts are normal, no anemia. Pending kidney and liver function tests, and tumor markers. Urine test negative. Tolerating current Xeloda dose well, no seizure-like activity or significant cold sensitivity. Willing to try a slight dose increase. Discussed risks of increased fatigue and need to monitor for numbness and tingling. Advised to avoid manicures and pedicures to reduce infection risk; waxing allowed with caution to avoid cuts. - Continue current chemotherapy regimen with Xeloda (two in the morning and three at night) - Increase dose of chemotherapy slightly - Monitor for numbness and tingling - Schedule next CT scan in April - Ensure next infusion is scheduled for March 12 - Advise to avoid manicures and pedicures to reduce infection risk - Allow waxing with caution to avoid cuts  General Health Maintenance Discussed need for fasting lipid panel during next visit. Advised on general precautions to avoid infection due to chemotherapy. - Order fasting lipid panel at next visit  Plan -Lab reviewed, adequate for treatment, will proceed oxaliplatin today with dose increased to 85 mg/m and continue every 2 weeks -Continue Xeloda 1500 mg twice daily, 7 days on and 7 days off -Follow-up in 2 weeks before next cycle chemo        SUMMARY OF ONCOLOGIC HISTORY: Oncology History  Cancer of ileum with carcinomatosis  07/10/2023 Initial Diagnosis   Cancer of ileum with carcinomatosis   07/22/2023 Cancer Staging   Staging form: Small Intestine - Adenocarcinoma, AJCC 8th Edition - Pathologic: Stage IV (pT4, pNX, pM1) - Signed by Malachy Mood, MD on 07/22/2023 Histologic grade (G): G2 Histologic  grading system: 4 grade system   07/31/2023 - 08/16/2023 Chemotherapy   Patient is on Treatment Plan : COLORECTAL FOLFOX q14d     08/28/2023 -  Chemotherapy   Patient is on Treatment Plan : COLORECTAL  CAPEOX (130/850) q21d x 8 cycles        Discussed the use of AI scribe software for clinical note transcription with the patient, who gave verbal consent to proceed.  History of Present Illness   The patient, with a known history of metastatic small bowel cancer, presents for a routine follow-up visit. She reports no new symptoms or issues since her last visit. She has been on an increased dose of Xeloda, which she has tolerated well without any reported problems. She takes two tablets in the morning and three at night. She denies any seizure-like activity since being taken off Keppra. She also reports no issues with her IV treatment.  The patient denies any pain or other symptoms. Her appetite is good, and she has even gained a few pounds. Her energy levels are good, although she notes a decline when she takes her medication. Despite this, she is still able to function, albeit with more breaks than usual.  The patient also reports no issues with cold sensitivity and has not had to use her nausea medication. She denies any numbness or tingling. She also mentions a concern about waxing her face, which the doctor approves as long as she doesn't cut herself.         All other systems were reviewed with the patient and are negative.  MEDICAL HISTORY:  Past Medical History:  Diagnosis Date   Abnormal uterine bleeding 04/10/2022   Anemia    Hypertension    Pre-diabetes 02/2022    SURGICAL HISTORY: Past Surgical History:  Procedure Laterality Date   BREAST LUMPECTOMY WITH RADIOACTIVE SEED LOCALIZATION Left 06/10/2022   Procedure: LEFT BREAST LUMPECTOMY WITH RADIOACTIVE SEED LOCALIZATION;  Surgeon: Griselda Miner, MD;  Location: [facility];  Service: General;  Laterality: Left;   CESAREAN SECTION     x4   IR IMAGING GUIDED PORT INSERTION  07/08/2023   LAPAROTOMY N/A 06/30/2023   Procedure: EXPLORATORY LAPAROTOMY, SMALL BOWEL RESECTION, AND EXCISIONAL BIOPSIES OF MULTIPLE  OMENTAL MASSES;  Surgeon: Berna Bue, MD;  Location: [facility];  Service: General;  Laterality: N/A;   TUBAL LIGATION      I have reviewed the social history and family history with the patient and they are unchanged from previous note.  ALLERGIES:  has no known allergies.  MEDICATIONS:  Current Outpatient Medications  Medication Sig Dispense Refill   acetaminophen (TYLENOL) 500 MG tablet Take 2 tablets (1,000 mg total) by mouth every 8 (eight) hours as needed for mild pain or fever.     capecitabine (XELODA) 500 MG tablet Take 3 tablets (1,500 mg total) by mouth 2 (two) times daily after a meal. Take within 30 minutes after meal. Take for 7 days on, then 7 days off, repeat every 14 days. 84 tablet 0   diclofenac Sodium (VOLTAREN) 1 % GEL Research Patient: Apply 0.5 grams (1 fingertip) to each hand and each foot twice daily for up to 12 weeks 400 g 0   Lacosamide 100 MG TABS Take 1 tablet (100 mg total) by mouth 2 (two) times daily. 60 tablet 3   levETIRAcetam (KEPPRA) 1000 MG tablet Take 1 tablet (1,000 mg total) by mouth 2 (two) times daily. (Patient not taking: Reported on 12/03/2023) 120 tablet  0   levETIRAcetam (KEPPRA) 1000 MG tablet Take 1 tablet (1,000 mg total) by mouth 2 (two) times daily. (Patient not taking: Reported on 12/03/2023) 60 tablet 0   LORazepam (ATIVAN) 1 MG tablet Take 1 tablet (1 mg total) by mouth every 6 (six) hours as needed for anxiety. 15 tablet 0   methocarbamol (ROBAXIN) 500 MG tablet Take 2 tablets (1,000 mg total) by mouth every 8 (eight) hours as needed for muscle spasms. 30 tablet 0   nitroGLYCERIN (NITROSTAT) 0.4 MG SL tablet Place 1 tablet (0.4 mg total) under the tongue every 5 (five) minutes as needed for chest pain. 25 tablet 0   oxyCODONE (OXY IR/ROXICODONE) 5 MG immediate release tablet Take 1-2 tablets (5-10 mg total) by mouth every 6 (six) hours as needed for moderate pain or severe pain (5mg  moderate, 10mg  severe). (Patient taking differently: Take  5 mg by mouth every 6 (six) hours as needed for moderate pain (pain score 4-6) or severe pain (pain score 7-10).) 60 tablet 0   polyethylene glycol powder (GLYCOLAX/MIRALAX) 17 GM/SCOOP powder Take 17 g by mouth daily mixed in liquid as directed. 238 g 0   simethicone (MYLICON) 80 MG chewable tablet Chew 1 tablet (80 mg total) by mouth 4 (four) times daily as needed for flatulence. 100 tablet 0   valsartan (DIOVAN) 40 MG tablet Take 1 tablet (40 mg total) by mouth daily. 90 tablet 2   No current facility-administered medications for this visit.   Facility-Administered Medications Ordered in Other Visits  Medication Dose Route Frequency Provider Last Rate Last Admin   dextrose 5 % solution   Intravenous Continuous Malachy Mood, MD   Stopped at 12/03/23 1613   sodium chloride flush (NS) 0.9 % injection 10 mL  10 mL Intracatheter PRN Malachy Mood, MD   10 mL at 12/03/23 1613    PHYSICAL EXAMINATION: ECOG PERFORMANCE STATUS: 1 - Symptomatic but completely ambulatory  Vitals:   12/03/23 1220  BP: (!) 159/84  Pulse: 82  Resp: 18  Temp: 97.6 F (36.4 C)  SpO2: 100%   Wt Readings from Last 3 Encounters:  12/03/23 196 lb 14.4 oz (89.3 kg)  11/11/23 192 lb 9.6 oz (87.4 kg)  10/30/23 192 lb (87.1 kg)     GENERAL:alert, no distress and comfortable SKIN: skin color, texture, turgor are normal, no rashes or significant lesions EYES: normal, Conjunctiva are pink and non-injected, sclera clear NECK: supple, thyroid normal size, non-tender, without nodularity LYMPH:  no palpable lymphadenopathy in the cervical, axillary  LUNGS: clear to auscultation and percussion with normal breathing effort HEART: regular rate & rhythm and no murmurs and no lower extremity edema ABDOMEN:abdomen soft, non-tender and normal bowel sounds Musculoskeletal:no cyanosis of digits and no clubbing  NEURO: alert & oriented x 3 with fluent speech, no focal motor/sensory deficits   LABORATORY DATA:  I have reviewed the  data as listed    Latest Ref Rng & Units 12/03/2023   11:26 AM 10/30/2023    7:43 AM 10/09/2023   12:14 PM  CBC  WBC 4.0 - 10.5 K/uL 8.6  6.4  6.5   Hemoglobin 12.0 - 15.0 g/dL 16.1  09.6  04.5   Hematocrit 36.0 - 46.0 % 41.4  40.5  39.6   Platelets 150 - 400 K/uL 337  295  327         Latest Ref Rng & Units 12/03/2023   11:26 AM 10/30/2023    7:43 AM 10/09/2023   12:14 PM  CMP  Glucose 70 - 99 mg/dL 96  95  098   BUN 6 - 20 mg/dL 11  8  6    Creatinine 0.44 - 1.00 mg/dL 1.19  1.47  8.29   Sodium 135 - 145 mmol/L 137  139  140   Potassium 3.5 - 5.1 mmol/L 3.7  3.5  3.5   Chloride 98 - 111 mmol/L 103  105  105   CO2 22 - 32 mmol/L 27  26  28    Calcium 8.9 - 10.3 mg/dL 9.2  9.4  9.3   Total Protein 6.5 - 8.1 g/dL  7.9  7.9   Total Bilirubin 0.0 - 1.2 mg/dL  0.4  0.3   Alkaline Phos 38 - 126 U/L  120  83   AST 15 - 41 U/L  20  21   ALT 0 - 44 U/L  12  15       RADIOGRAPHIC STUDIES: I have personally reviewed the radiological images as listed and agreed with the findings in the report. No results found.    Orders Placed This Encounter  Procedures   [facility] (Access)-[facility] ONLY    Standing Status:   Standing    Number of Occurrences:   50    Expiration Date:   12/02/2024   All questions were answered. The patient knows to call the clinic with any problems, questions or concerns. No barriers to learning was detected.  The total time spent in the appointment was 25 mins .     Malachy Mood, MD 12/03/2023

## 2023-12-03 NOTE — Assessment & Plan Note (Signed)
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  [name] in the liver MRI also showed a 1.8 cm oligo met in segment 7 of liver. -We discussed the incurable nature of her metastatic disease, due to her metastasis in peritoneum and liver.  We also discussed the small possibility of HIPEC and liver directed therapy if she has excellent response to chemotherapy. -I recommend first-line chemotherapy FOLFOX.  She started on July 31, 2023, oxaliplatin was held for first cycle due to open wound. -She developed seizure activity after first cycle of 5-FU infusion. She was evaluated by new oncologist Dr. Barbaraann Cao, her seizure was felt to be unlikely related to 5-FU. She felt that seizure was coming after cycle 2 chemo when she coming in for pump d/c and required ativan. I changed her cehmo to CAPOX on 08/28/2023. She required dose reduction  -restaging CT 11/11/2023 showed mixed response with improved liver met and slightly worse peritoneal mets, I slightly increased her xeloda dose

## 2023-12-03 NOTE — Patient Instructions (Signed)

## 2023-12-04 ENCOUNTER — Encounter: Payer: Self-pay | Admitting: Nurse Practitioner

## 2023-12-04 ENCOUNTER — Encounter: Payer: Self-pay | Admitting: Hematology

## 2023-12-04 LAB — HEMOGLOBIN A1C
Est. average glucose Bld gHb Est-mCnc: 128 mg/dL
Hgb A1c MFr Bld: 6.1 % — ABNORMAL HIGH (ref 4.8–5.6)

## 2023-12-10 ENCOUNTER — Other Ambulatory Visit: Payer: Self-pay

## 2023-12-11 ENCOUNTER — Other Ambulatory Visit: Payer: BC Managed Care – PPO

## 2023-12-11 ENCOUNTER — Ambulatory Visit: Payer: BC Managed Care – PPO

## 2023-12-11 ENCOUNTER — Ambulatory Visit: Payer: BC Managed Care – PPO | Admitting: Nurse Practitioner

## 2023-12-16 ENCOUNTER — Other Ambulatory Visit (HOSPITAL_COMMUNITY): Payer: Self-pay

## 2023-12-16 ENCOUNTER — Other Ambulatory Visit: Payer: Self-pay | Admitting: Hematology

## 2023-12-16 DIAGNOSIS — C172 Malignant neoplasm of ileum: Secondary | ICD-10-CM

## 2023-12-16 NOTE — Progress Notes (Signed)
 Specialty Pharmacy Refill Coordination Note  Linda [name] is a 58 y.o. female contacted today regarding refills of specialty medication(s) Capecitabine (XELODA)   Patient requested Delivery   Delivery date: 12/19/23   Verified address: [address] [place], [place], [place]   Medication will be filled on 12/18/23.This fill date is pending response to refill request from provider. Patient is aware and if they have not received fill by intended date they must follow up with pharmacy.

## 2023-12-16 NOTE — Progress Notes (Signed)
 Specialty Pharmacy Ongoing Clinical Assessment Note  NICOLEMARIE WOOLEY is a 58 y.o. female who is being followed by the specialty pharmacy service for RxSp Oncology   Patient's specialty medication(s) reviewed today: Capecitabine (XELODA)   Missed doses in the last 4 weeks: 1   Patient/Caregiver did not have any additional questions or concerns.   Therapeutic benefit summary: Patient is achieving benefit   Adverse events/side effects summary: Experienced adverse events/side effects (headache and back pain, tolerable at this time)   Patient's therapy is appropriate to: Continue    Goals Addressed             This Visit's Progress    Slow Disease Progression   Worsening    Patient is initiating therapy. Patient will maintain adherence. Per Dr. Latanya Maudlin office notes from 11/20/23 there was mild progression, she adjusted the patient's dose at that time.          Follow up:  3 months  Servando Snare Specialty Pharmacist

## 2023-12-16 NOTE — Assessment & Plan Note (Signed)
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  [name] in the liver MRI also showed a 1.8 cm oligo met in segment 7 of liver. -We discussed the incurable nature of her metastatic disease, due to her metastasis in peritoneum and liver.  We also discussed the small possibility of HIPEC and liver directed therapy if she has excellent response to chemotherapy. -I recommend first-line chemotherapy FOLFOX.  She started on July 31, 2023, oxaliplatin was held for first cycle due to open wound. -She developed seizure activity after first cycle of 5-FU infusion. She was evaluated by new oncologist Dr. Barbaraann Cao, her seizure was felt to be unlikely related to 5-FU. She felt that seizure was coming after cycle 2 chemo when she coming in for pump d/c and required ativan. I changed her cehmo to CAPOX on 08/28/2023. She required dose reduction  -restaging CT 11/11/2023 showed mixed response with improved liver met and slightly worse peritoneal mets, I slightly increased her xeloda dose

## 2023-12-17 ENCOUNTER — Inpatient Hospital Stay (HOSPITAL_BASED_OUTPATIENT_CLINIC_OR_DEPARTMENT_OTHER): Payer: Managed Care, Other (non HMO) | Admitting: Hematology

## 2023-12-17 ENCOUNTER — Encounter: Payer: Self-pay | Admitting: Hematology

## 2023-12-17 ENCOUNTER — Inpatient Hospital Stay: Payer: Managed Care, Other (non HMO)

## 2023-12-17 ENCOUNTER — Other Ambulatory Visit: Payer: Self-pay | Admitting: Nurse Practitioner

## 2023-12-17 ENCOUNTER — Other Ambulatory Visit: Payer: Self-pay

## 2023-12-17 ENCOUNTER — Other Ambulatory Visit (HOSPITAL_COMMUNITY): Payer: Self-pay

## 2023-12-17 VITALS — BP 159/86 | HR 72 | Resp 16

## 2023-12-17 VITALS — BP 179/96 | HR 78 | Temp 98.2°F | Resp 17 | Wt 200.9 lb

## 2023-12-17 DIAGNOSIS — Z95828 Presence of other vascular implants and grafts: Secondary | ICD-10-CM

## 2023-12-17 DIAGNOSIS — C172 Malignant neoplasm of ileum: Secondary | ICD-10-CM

## 2023-12-17 DIAGNOSIS — Z5111 Encounter for antineoplastic chemotherapy: Secondary | ICD-10-CM | POA: Diagnosis not present

## 2023-12-17 DIAGNOSIS — E1169 Type 2 diabetes mellitus with other specified complication: Secondary | ICD-10-CM

## 2023-12-17 DIAGNOSIS — D500 Iron deficiency anemia secondary to blood loss (chronic): Secondary | ICD-10-CM

## 2023-12-17 DIAGNOSIS — E7800 Pure hypercholesterolemia, unspecified: Secondary | ICD-10-CM

## 2023-12-17 LAB — CBC WITH DIFFERENTIAL (CANCER CENTER ONLY)
Abs Immature Granulocytes: 0.02 10*3/uL (ref 0.00–0.07)
Basophils Absolute: 0 10*3/uL (ref 0.0–0.1)
Basophils Relative: 0 %
Eosinophils Absolute: 0.1 10*3/uL (ref 0.0–0.5)
Eosinophils Relative: 1 %
HCT: 39.9 % (ref 36.0–46.0)
Hemoglobin: 13.3 g/dL (ref 12.0–15.0)
Immature Granulocytes: 0 %
Lymphocytes Relative: 26 %
Lymphs Abs: 2.2 10*3/uL (ref 0.7–4.0)
MCH: 31.1 pg (ref 26.0–34.0)
MCHC: 33.3 g/dL (ref 30.0–36.0)
MCV: 93.4 fL (ref 80.0–100.0)
Monocytes Absolute: 0.6 10*3/uL (ref 0.1–1.0)
Monocytes Relative: 7 %
Neutro Abs: 5.5 10*3/uL (ref 1.7–7.7)
Neutrophils Relative %: 66 %
Platelet Count: 259 10*3/uL (ref 150–400)
RBC: 4.27 MIL/uL (ref 3.87–5.11)
RDW: 15.9 % — ABNORMAL HIGH (ref 11.5–15.5)
WBC Count: 8.4 10*3/uL (ref 4.0–10.5)
nRBC: 0 % (ref 0.0–0.2)

## 2023-12-17 LAB — BASIC METABOLIC PANEL - CANCER CENTER ONLY
Anion gap: 6 (ref 5–15)
BUN: 9 mg/dL (ref 6–20)
CO2: 27 mmol/L (ref 22–32)
Calcium: 9.2 mg/dL (ref 8.9–10.3)
Chloride: 105 mmol/L (ref 98–111)
Creatinine: 0.8 mg/dL (ref 0.44–1.00)
GFR, Estimated: >60 mL/min (ref >60)
Glucose, Bld: 94 mg/dL (ref 70–99)
Potassium: 3.6 mmol/L (ref 3.5–5.1)
Sodium: 138 mmol/L (ref 135–145)

## 2023-12-17 LAB — HEPATIC FUNCTION PANEL
ALT: 16 U/L (ref 0–44)
AST: 22 U/L (ref 15–41)
Albumin: 3.8 g/dL (ref 3.5–5.0)
Alkaline Phosphatase: 78 U/L (ref 38–126)
Bilirubin, Direct: <0.1 mg/dL (ref 0.0–0.2)
Total Bilirubin: 0.7 mg/dL (ref 0.0–1.2)
Total Protein: 7.8 g/dL (ref 6.5–8.1)

## 2023-12-17 LAB — FERRITIN: Ferritin: 130 ng/mL (ref 11–307)

## 2023-12-17 MED ORDER — LORAZEPAM 1 MG PO TABS
1.0000 mg | ORAL_TABLET | Freq: Four times a day (QID) | ORAL | 0 refills | Status: DC | PRN
  Filled 2023-12-17: qty 30, 8d supply, fill #0

## 2023-12-17 MED ORDER — SODIUM CHLORIDE 0.9% FLUSH
10.0000 mL | INTRAVENOUS | Status: DC | PRN
  Administered 2023-12-17: 10 mL

## 2023-12-17 MED ORDER — OXALIPLATIN CHEMO INJECTION 100 MG/20ML
85.0000 mg/m2 | Freq: Once | INTRAVENOUS | Status: AC
  Administered 2023-12-17: 150 mg via INTRAVENOUS
  Filled 2023-12-17: qty 10

## 2023-12-17 MED ORDER — SODIUM CHLORIDE 0.9% FLUSH
10.0000 mL | Freq: Once | INTRAVENOUS | Status: AC
  Administered 2023-12-17: 10 mL

## 2023-12-17 MED ORDER — DEXAMETHASONE SODIUM PHOSPHATE 10 MG/ML IJ SOLN
10.0000 mg | Freq: Once | INTRAMUSCULAR | Status: AC
  Administered 2023-12-17: 10 mg via INTRAVENOUS
  Filled 2023-12-17: qty 1

## 2023-12-17 MED ORDER — DEXTROSE 5 % IV SOLN: INTRAVENOUS | Status: DC

## 2023-12-17 MED ORDER — CAPECITABINE 500 MG PO TABS
ORAL_TABLET | ORAL | 1 refills | Status: DC
  Filled 2023-12-17: qty 70, 28d supply, fill #0
  Filled 2024-01-12: qty 70, 28d supply, fill #1

## 2023-12-17 MED ORDER — HEPARIN SOD (PORK) LOCK FLUSH 100 UNIT/ML IV SOLN
500.0000 [IU] | Freq: Once | INTRAVENOUS | Status: AC | PRN
Start: 2023-12-17 — End: 2023-12-17
  Administered 2023-12-17: 500 [IU]

## 2023-12-17 MED ORDER — METHOCARBAMOL 500 MG PO TABS
1000.0000 mg | ORAL_TABLET | Freq: Three times a day (TID) | ORAL | 0 refills | Status: DC | PRN
  Filled 2023-12-17: qty 30, 5d supply, fill #0

## 2023-12-17 MED ORDER — PALONOSETRON HCL INJECTION 0.25 MG/5ML
0.2500 mg | Freq: Once | INTRAVENOUS | Status: AC
Start: 2023-12-17 — End: 2023-12-17
  Administered 2023-12-17: 0.25 mg via INTRAVENOUS
  Filled 2023-12-17: qty 5

## 2023-12-17 NOTE — Patient Instructions (Signed)
 [facility] - A DEPT OF [facility]  Discharge Instructions: Thank you for choosing [facility] to provide your oncology and hematology care.   If you have a lab appointment with the Cancer Center, please go directly to the Cancer Center and check in at the registration area.   Wear comfortable clothing and clothing appropriate for easy access to any Portacath or PICC line.   We strive to give you quality time with your provider. You may need to reschedule your appointment if you arrive late (15 or more minutes).  Arriving late affects you and other patients whose appointments are after yours.  Also, if you miss three or more appointments without notifying the office, you may be dismissed from the clinic at the provider's discretion.      For prescription refill requests, have your pharmacy contact our office and allow 72 hours for refills to be completed.    Today you received the following chemotherapy and/or immunotherapy agents: Oxaliplatin      To help prevent nausea and vomiting after your treatment, we encourage you to take your nausea medication as directed.  BELOW ARE SYMPTOMS THAT SHOULD BE REPORTED IMMEDIATELY: *FEVER GREATER THAN 100.4 F (38 C) OR HIGHER *CHILLS OR SWEATING *NAUSEA AND VOMITING THAT IS NOT CONTROLLED WITH YOUR NAUSEA MEDICATION *UNUSUAL SHORTNESS OF BREATH *UNUSUAL BRUISING OR BLEEDING *URINARY PROBLEMS (pain or burning when urinating, or frequent urination) *BOWEL PROBLEMS (unusual diarrhea, constipation, pain near the anus) TENDERNESS IN MOUTH AND THROAT WITH OR WITHOUT PRESENCE OF ULCERS (sore throat, sores in mouth, or a toothache) UNUSUAL RASH, SWELLING OR PAIN  UNUSUAL VAGINAL DISCHARGE OR ITCHING   Items with * indicate a potential emergency and should be followed up as soon as possible or go to the Emergency Department if any problems should occur.  Please show the CHEMOTHERAPY ALERT CARD or  IMMUNOTHERAPY ALERT CARD at check-in to the Emergency Department and triage nurse.  Should you have questions after your visit or need to cancel or reschedule your appointment, please contact [facility] - [facility]  Dept: [phone]  and follow the prompts.  Office hours are 8:00 a.m. to 4:30 p.m. Monday - Friday. Please note that voicemails left after 4:00 p.m. may not be returned until the following business day.  We are closed weekends and major holidays. You have access to a nurse at all times for urgent questions. Please call the main number to the clinic Dept: [phone] and follow the prompts.   For any non-urgent questions, you may also contact your provider using MyChart. We now offer e-Visits for anyone 36 and older to request care online for non-urgent symptoms. For details visit [email or web address].   Also download the MyChart app! Go to the app store, search "MyChart", open the app, select [place], and log in with your MyChart username and password.

## 2023-12-17 NOTE — Progress Notes (Signed)
 Patient was complaining of pain with flushing at [facility] site and deeper- needle removed and replaced with an entire new set using sterile technique. Patient states that it now feels the same as every other time with the new needle. Spoke with Janett Billow- Dr. Latanya Maudlin nurse-about situation. Patient satisfied that [facility] is feeling appropriate for treatment.

## 2023-12-17 NOTE — Progress Notes (Signed)
 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  [name] in the liver MRI also showed a 1.8 cm oligo met in segment 7 of liver. -We discussed the incurable nature of her metastatic disease, due to her metastasis in peritoneum and liver.  We also discussed the small possibility of HIPEC and liver directed therapy if she has excellent response to chemotherapy. -I recommend first-line chemotherapy FOLFOX.  She started on July 31, 2023, oxaliplatin was held for first cycle due to open wound. -She developed seizure activity after first cycle of 5-FU infusion. She was evaluated by new oncologist Dr. Barbaraann Cao, her seizure was felt to be unlikely related to 5-FU. She felt that seizure was coming after cycle 2 chemo when she coming in for pump d/c and required ativan. I changed her cehmo to CAPOX on 08/28/2023. She required dose reduction  -restaging CT 11/11/2023 showed mixed response with improved liver met and slightly worse peritoneal mets, I slightly increased her xeloda dose     Assessment and Plan    Metastatic small cell bowel cancer Undergoing chemotherapy with oral and intravenous agents. Recent dose increase caused chest pain, headache, and back pain, managed with heartburn  medication and Ativan. Symptoms improved during chemotherapy week. Next scan planned for late March or April. Blood counts and liver function tests are normal. Discussed coordinating chemotherapy schedules to minimize side effects, though combined treatment may be more effective from a cancer perspective. - Continue current chemotherapy regimen with oral and intravenous agents. - Refill chemotherapy pills, Ativan, and methocarbamol prescriptions. - Advise against concurrent use of Ativan and Robaxin due to sedative effects. - Schedule next scan for late March or April. - Coordinate chemotherapy schedules to start earlier in the week if possible.  Chest pain Likely related to increased chemotherapy dose, described as heartburn. Managed with heartburn medication. - Continue using heartburn medication as needed.  Headache and back pain Likely muscle spasms related to increased chemotherapy dose. Managed with methocarbamol. - Continue methocarbamol for muscle spasms. - Avoid concurrent use of Ativan and Robaxin due to sedative effects.  Anxiety Managed with Ativan as needed. - Refill Ativan prescription. - Advise against concurrent use of Ativan and Robaxin.     Plan -Lab reviewed, adequate for treatment, will proceed oxaliplatin today at the same dose and continue every 2 weeks -She will start capecitabine in 2 to 3 days for 7 days, then off until next cycle in 14 days. -Follow-up in 2 weeks    SUMMARY OF ONCOLOGIC HISTORY: Oncology History  Cancer of ileum with carcinomatosis  07/10/2023 Initial Diagnosis   Cancer of ileum with carcinomatosis   07/22/2023 Cancer Staging   Staging form: Small Intestine - Adenocarcinoma, AJCC 8th Edition - Pathologic: Stage IV (pT4, pNX, pM1) - Signed by Malachy Mood, MD on 07/22/2023 Histologic grade (G): G2 Histologic grading  system: 4 grade system   07/31/2023 - 08/16/2023 Chemotherapy   Patient is on Treatment Plan : COLORECTAL FOLFOX q14d      08/28/2023 -  Chemotherapy   Patient is on Treatment Plan : COLORECTAL CAPEOX (130/850) q21d x 8 cycles        Discussed the use of AI scribe software for clinical note transcription with the patient, who gave verbal consent to proceed.  History of Present Illness   The patient, with a history of metastatic small cell bowel cancer, presents for a follow-up visit after the last cycle of chemotherapy. They report experiencing chest pain, similar to heartburn, headaches, and back pain, which they describe as muscle spasms. These symptoms last between 30 to 45 minutes and have been occurring on and off for the past five days. The patient has been managing the chest pain with heartburn medication, which has provided relief. They also report no issues with eating and no seizure-like activity. The patient is currently on Ativan and a chemotherapy pill, for which they need refills. They also take Robaxin for back pain. The patient reports that these symptoms are more likely related to the chemotherapy pill, as they do not experience them during the infusion.         All other systems were reviewed with the patient and are negative.  MEDICAL HISTORY:  Past Medical History:  Diagnosis Date   Abnormal uterine bleeding 04/10/2022   Anemia    Hypertension    Pre-diabetes 02/2022    SURGICAL HISTORY: Past Surgical History:  Procedure Laterality Date   BREAST LUMPECTOMY WITH RADIOACTIVE SEED LOCALIZATION Left 06/10/2022   Procedure: LEFT BREAST LUMPECTOMY WITH RADIOACTIVE SEED LOCALIZATION;  Surgeon: Griselda Miner, MD;  Location: [facility];  Service: General;  Laterality: Left;   CESAREAN SECTION     x4   IR IMAGING GUIDED PORT INSERTION  07/08/2023   LAPAROTOMY N/A 06/30/2023   Procedure: EXPLORATORY LAPAROTOMY, SMALL BOWEL RESECTION, AND EXCISIONAL BIOPSIES OF MULTIPLE OMENTAL MASSES;  Surgeon: Berna Bue, MD;  Location: [facility];  Service: General;  Laterality: N/A;   TUBAL  LIGATION      I have reviewed the social history and family history with the patient and they are unchanged from previous note.  ALLERGIES:  has no known allergies.  MEDICATIONS:  Current Outpatient Medications  Medication Sig Dispense Refill   acetaminophen (TYLENOL) 500 MG tablet Take 2 tablets (1,000 mg total) by mouth every 8 (eight) hours as needed for mild pain or fever.     capecitabine (XELODA) 500 MG tablet TAKE  2 tabs in morning and 3 tabs in evening, every 10-12 hours. Take within 30 minutes after meal. Take for 7 days on, then 7 days off, repeat every 14 days. 70 tablet 1   diclofenac Sodium (VOLTAREN) 1 % GEL Research Patient: Apply 0.5 grams (1 fingertip) to each hand and each foot twice daily for up to 12 weeks 400 g 0   Lacosamide 100 MG TABS Take 1 tablet (100 mg total) by mouth 2 (two) times daily. 60 tablet 3   levETIRAcetam (KEPPRA) 1000 MG tablet Take 1 tablet (1,000 mg total) by mouth 2 (two) times daily. (Patient not taking: Reported on 12/03/2023) 120 tablet 0   levETIRAcetam (KEPPRA) 1000 MG tablet Take 1 tablet (1,000 mg total) by mouth 2 (two) times daily. (Patient not taking: Reported on 12/03/2023) 60 tablet 0   LORazepam (ATIVAN) 1 MG tablet Take 1 tablet (1 mg total)  by mouth every 6 (six) hours as needed for anxiety. 30 tablet 0   methocarbamol (ROBAXIN) 500 MG tablet Take 2 tablets (1,000 mg total) by mouth every 8 (eight) hours as needed for muscle spasms. 30 tablet 0   nitroGLYCERIN (NITROSTAT) 0.4 MG SL tablet Place 1 tablet (0.4 mg total) under the tongue every 5 (five) minutes as needed for chest pain. 25 tablet 0   oxyCODONE (OXY IR/ROXICODONE) 5 MG immediate release tablet Take 1-2 tablets (5-10 mg total) by mouth every 6 (six) hours as needed for moderate pain or severe pain (5mg  moderate, 10mg  severe). (Patient taking differently: Take 5 mg by mouth every 6 (six) hours as needed for moderate pain (pain score 4-6) or severe pain (pain score 7-10).) 60 tablet  0   polyethylene glycol powder (GLYCOLAX/MIRALAX) 17 GM/SCOOP powder Take 17 g by mouth daily mixed in liquid as directed. 238 g 0   simethicone (MYLICON) 80 MG chewable tablet Chew 1 tablet (80 mg total) by mouth 4 (four) times daily as needed for flatulence. 100 tablet 0   valsartan (DIOVAN) 40 MG tablet Take 1 tablet (40 mg total) by mouth daily. 90 tablet 2   No current facility-administered medications for this visit.   Facility-Administered Medications Ordered in Other Visits  Medication Dose Route Frequency Provider Last Rate Last Admin   dextrose 5 % solution   Intravenous Continuous Malachy Mood, MD   Stopped at 12/17/23 1332   sodium chloride flush (NS) 0.9 % injection 10 mL  10 mL Intracatheter PRN Malachy Mood, MD   10 mL at 12/17/23 1337    PHYSICAL EXAMINATION: ECOG PERFORMANCE STATUS: 1 - Symptomatic but completely ambulatory  Vitals:   12/17/23 0928  BP: (!) 179/96  Pulse: 78  Resp: 17  Temp: 98.2 F (36.8 C)  SpO2: 100%   Wt Readings from Last 3 Encounters:  12/17/23 200 lb 14.4 oz (91.1 kg)  12/03/23 196 lb 14.4 oz (89.3 kg)  11/11/23 192 lb 9.6 oz (87.4 kg)     GENERAL:alert, no distress and comfortable SKIN: skin color, texture, turgor are normal, no rashes or significant lesions EYES: normal, Conjunctiva are pink and non-injected, sclera clear NECK: supple, thyroid normal size, non-tender, without nodularity LYMPH:  no palpable lymphadenopathy in the cervical, axillary  LUNGS: clear to auscultation and percussion with normal breathing effort HEART: regular rate & rhythm and no murmurs and no lower extremity edema ABDOMEN:abdomen soft, non-tender and normal bowel sounds Musculoskeletal:no cyanosis of digits and no clubbing  NEURO: alert & oriented x 3 with fluent speech, no focal motor/sensory deficits       LABORATORY DATA:  I have reviewed the data as listed    Latest Ref Rng & Units 12/17/2023    8:57 AM 12/03/2023   11:26 AM 10/30/2023    7:43 AM  CBC   WBC 4.0 - 10.5 K/uL 8.4  8.6  6.4   Hemoglobin 12.0 - 15.0 g/dL 16.1  09.6  04.5   Hematocrit 36.0 - 46.0 % 39.9  41.4  40.5   Platelets 150 - 400 K/uL 259  337  295         Latest Ref Rng & Units 12/17/2023    8:57 AM 12/03/2023   11:26 AM 10/30/2023    7:43 AM  CMP  Glucose 70 - 99 mg/dL 94  96  95   BUN 6 - 20 mg/dL 9  11  8    Creatinine 0.44 - 1.00 mg/dL 4.09  8.11  0.73   Sodium 135 - 145 mmol/L 138  137  139   Potassium 3.5 - 5.1 mmol/L 3.6  3.7  3.5   Chloride 98 - 111 mmol/L 105  103  105   CO2 22 - 32 mmol/L 27  27  26    Calcium 8.9 - 10.3 mg/dL 9.2  9.2  9.4   Total Protein 6.5 - 8.1 g/dL 7.8   7.9   Total Bilirubin 0.0 - 1.2 mg/dL 0.7   0.4   Alkaline Phos 38 - 126 U/L 78   120   AST 15 - 41 U/L 22   20   ALT 0 - 44 U/L 16   12       RADIOGRAPHIC STUDIES: I have personally reviewed the radiological images as listed and agreed with the findings in the report. No results found.    No orders of the defined types were placed in this encounter.  All questions were answered. The patient knows to call the clinic with any problems, questions or concerns. No barriers to learning was detected. The total time spent in the appointment was 30 minutes.     Malachy Mood, MD 12/17/2023

## 2023-12-18 ENCOUNTER — Other Ambulatory Visit (HOSPITAL_COMMUNITY): Payer: Self-pay

## 2023-12-18 ENCOUNTER — Other Ambulatory Visit: Payer: Self-pay

## 2023-12-18 LAB — LIPID PANEL
Chol/HDL Ratio: 5 {ratio} — ABNORMAL HIGH (ref 0.0–4.4)
Cholesterol, Total: 248 mg/dL — ABNORMAL HIGH (ref 100–199)
HDL: 50 mg/dL (ref >39)
LDL Chol Calc (NIH): 165 mg/dL — ABNORMAL HIGH (ref 0–99)
Triglycerides: 178 mg/dL — ABNORMAL HIGH (ref 0–149)
VLDL Cholesterol Cal: 33 mg/dL (ref 5–40)

## 2023-12-19 ENCOUNTER — Other Ambulatory Visit: Payer: Self-pay

## 2023-12-22 ENCOUNTER — Other Ambulatory Visit: Payer: Self-pay

## 2023-12-23 ENCOUNTER — Other Ambulatory Visit (HOSPITAL_COMMUNITY): Payer: Self-pay

## 2023-12-23 ENCOUNTER — Other Ambulatory Visit: Payer: Self-pay

## 2023-12-25 ENCOUNTER — Other Ambulatory Visit: Payer: BC Managed Care – PPO

## 2023-12-25 ENCOUNTER — Ambulatory Visit: Payer: BC Managed Care – PPO | Admitting: Hematology

## 2023-12-25 ENCOUNTER — Ambulatory Visit: Payer: BC Managed Care – PPO

## 2023-12-26 ENCOUNTER — Other Ambulatory Visit: Payer: Self-pay

## 2023-12-30 NOTE — Assessment & Plan Note (Signed)
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  [name] in the liver MRI also showed a 1.8 cm oligo met in segment 7 of liver. -We discussed the incurable nature of her metastatic disease, due to her metastasis in peritoneum and liver.  We also discussed the small possibility of HIPEC and liver directed therapy if she has excellent response to chemotherapy. -I recommend first-line chemotherapy FOLFOX.  She started on July 31, 2023, oxaliplatin was held for first cycle due to open wound. -She developed seizure activity after first cycle of 5-FU infusion. She was evaluated by new oncologist Dr. Barbaraann Cao, her seizure was felt to be unlikely related to 5-FU. She felt that seizure was coming after cycle 2 chemo when she coming in for pump d/c and required ativan. I changed her cehmo to CAPOX on 08/28/2023. She required dose reduction  -restaging CT 11/11/2023 showed mixed response with improved liver met and slightly worse peritoneal mets, I slightly increased her xeloda dose

## 2023-12-31 ENCOUNTER — Inpatient Hospital Stay: Payer: BC Managed Care – PPO

## 2023-12-31 ENCOUNTER — Inpatient Hospital Stay (HOSPITAL_BASED_OUTPATIENT_CLINIC_OR_DEPARTMENT_OTHER): Payer: BC Managed Care – PPO | Admitting: Hematology

## 2023-12-31 ENCOUNTER — Encounter: Payer: Self-pay | Admitting: Hematology

## 2023-12-31 ENCOUNTER — Other Ambulatory Visit (HOSPITAL_BASED_OUTPATIENT_CLINIC_OR_DEPARTMENT_OTHER): Payer: Self-pay

## 2023-12-31 ENCOUNTER — Other Ambulatory Visit (HOSPITAL_COMMUNITY): Payer: Self-pay

## 2023-12-31 ENCOUNTER — Other Ambulatory Visit: Payer: Self-pay

## 2023-12-31 ENCOUNTER — Inpatient Hospital Stay: Payer: BC Managed Care – PPO | Attending: Hematology

## 2023-12-31 VITALS — BP 150/92 | HR 101 | Temp 98.0°F | Resp 20 | Ht 64.0 in | Wt 200.5 lb

## 2023-12-31 VITALS — HR 76 | Resp 17

## 2023-12-31 DIAGNOSIS — C786 Secondary malignant neoplasm of retroperitoneum and peritoneum: Secondary | ICD-10-CM | POA: Insufficient documentation

## 2023-12-31 DIAGNOSIS — C172 Malignant neoplasm of ileum: Secondary | ICD-10-CM | POA: Insufficient documentation

## 2023-12-31 DIAGNOSIS — Z95828 Presence of other vascular implants and grafts: Secondary | ICD-10-CM

## 2023-12-31 DIAGNOSIS — Z79899 Other long term (current) drug therapy: Secondary | ICD-10-CM | POA: Diagnosis not present

## 2023-12-31 DIAGNOSIS — Z5111 Encounter for antineoplastic chemotherapy: Secondary | ICD-10-CM | POA: Insufficient documentation

## 2023-12-31 DIAGNOSIS — C787 Secondary malignant neoplasm of liver and intrahepatic bile duct: Secondary | ICD-10-CM | POA: Insufficient documentation

## 2023-12-31 LAB — BASIC METABOLIC PANEL - CANCER CENTER ONLY
Anion gap: 5 (ref 5–15)
BUN: 7 mg/dL (ref 6–20)
CO2: 29 mmol/L (ref 22–32)
Calcium: 8.9 mg/dL (ref 8.9–10.3)
Chloride: 104 mmol/L (ref 98–111)
Creatinine: 0.8 mg/dL (ref 0.44–1.00)
GFR, Estimated: >60 mL/min (ref >60)
Glucose, Bld: 118 mg/dL — ABNORMAL HIGH (ref 70–99)
Potassium: 3.3 mmol/L — ABNORMAL LOW (ref 3.5–5.1)
Sodium: 138 mmol/L (ref 135–145)

## 2023-12-31 LAB — CBC WITH DIFFERENTIAL (CANCER CENTER ONLY)
Abs Immature Granulocytes: 0.02 10*3/uL (ref 0.00–0.07)
Basophils Absolute: 0 10*3/uL (ref 0.0–0.1)
Basophils Relative: 0 %
Eosinophils Absolute: 0.1 10*3/uL (ref 0.0–0.5)
Eosinophils Relative: 1 %
HCT: 40.7 % (ref 36.0–46.0)
Hemoglobin: 13.7 g/dL (ref 12.0–15.0)
Immature Granulocytes: 0 %
Lymphocytes Relative: 37 %
Lymphs Abs: 2.5 10*3/uL (ref 0.7–4.0)
MCH: 31.7 pg (ref 26.0–34.0)
MCHC: 33.7 g/dL (ref 30.0–36.0)
MCV: 94.2 fL (ref 80.0–100.0)
Monocytes Absolute: 0.8 10*3/uL (ref 0.1–1.0)
Monocytes Relative: 12 %
Neutro Abs: 3.4 10*3/uL (ref 1.7–7.7)
Neutrophils Relative %: 50 %
Platelet Count: 259 10*3/uL (ref 150–400)
RBC: 4.32 MIL/uL (ref 3.87–5.11)
RDW: 15.9 % — ABNORMAL HIGH (ref 11.5–15.5)
WBC Count: 6.9 10*3/uL (ref 4.0–10.5)
nRBC: 0 % (ref 0.0–0.2)

## 2023-12-31 LAB — HEPATIC FUNCTION PANEL
ALT: 21 U/L (ref 0–44)
AST: 32 U/L (ref 15–41)
Albumin: 3.6 g/dL (ref 3.5–5.0)
Alkaline Phosphatase: 88 U/L (ref 38–126)
Bilirubin, Direct: 0.1 mg/dL (ref 0.0–0.2)
Indirect Bilirubin: 0.6 mg/dL (ref 0.3–0.9)
Total Bilirubin: 0.7 mg/dL (ref 0.0–1.2)
Total Protein: 7.9 g/dL (ref 6.5–8.1)

## 2023-12-31 LAB — CEA (ACCESS): CEA (CHCC): 41.76 ng/mL — ABNORMAL HIGH (ref 0.00–5.00)

## 2023-12-31 LAB — PREGNANCY, URINE: Preg Test, Ur: NEGATIVE

## 2023-12-31 MED ORDER — DEXAMETHASONE SODIUM PHOSPHATE 10 MG/ML IJ SOLN
10.0000 mg | Freq: Once | INTRAMUSCULAR | Status: AC
  Administered 2023-12-31: 10 mg via INTRAVENOUS
  Filled 2023-12-31: qty 1

## 2023-12-31 MED ORDER — PALONOSETRON HCL INJECTION 0.25 MG/5ML
0.2500 mg | Freq: Once | INTRAVENOUS | Status: AC
Start: 2023-12-31 — End: 2023-12-31
  Administered 2023-12-31: 0.25 mg via INTRAVENOUS
  Filled 2023-12-31: qty 5

## 2023-12-31 MED ORDER — DEXTROSE 5 % IV SOLN: INTRAVENOUS | Status: DC

## 2023-12-31 MED ORDER — SODIUM CHLORIDE 0.9% FLUSH
10.0000 mL | Freq: Once | INTRAVENOUS | Status: AC
  Administered 2023-12-31: 10 mL

## 2023-12-31 MED ORDER — DEXTROSE 5 % IV SOLN
85.0000 mg/m2 | Freq: Once | INTRAVENOUS | Status: AC
  Administered 2023-12-31: 150 mg via INTRAVENOUS
  Filled 2023-12-31: qty 25.59

## 2023-12-31 MED ORDER — ONDANSETRON HCL 8 MG PO TABS
8.0000 mg | ORAL_TABLET | Freq: Three times a day (TID) | ORAL | 2 refills | Status: AC | PRN
Start: 2023-12-31 — End: ?
  Filled 2023-12-31: qty 9, 30d supply, fill #0

## 2023-12-31 NOTE — Progress Notes (Signed)
 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  [name] in the liver MRI also showed a 1.8 cm oligo met in segment 7 of liver. -We discussed the incurable nature of her metastatic disease, due to her metastasis in peritoneum and liver.  We also discussed the small possibility of HIPEC and liver directed therapy if she has excellent response to chemotherapy. -I recommend first-line chemotherapy FOLFOX.  She started on July 31, 2023, oxaliplatin was held for first cycle due to open wound. -She developed seizure activity after first cycle of 5-FU infusion. She was evaluated by new oncologist Dr. Barbaraann Cao, her seizure was felt to be unlikely related to 5-FU. She felt that seizure was coming after cycle 2 chemo when she coming in for pump d/c and required ativan. I changed her cehmo to CAPOX on 08/28/2023. She required dose reduction  -restaging CT 11/11/2023 showed mixed response with improved liver met and slightly worse peritoneal mets, I slightly increased her xeloda dose     Assessment and Plan    Metastatic colon cancer Undergoing long-term chemotherapy for metastatic colon cancer. Reports fatigue but maintains good appetite and recovers energy by the second week  post-treatment. Blood counts are normal, with no fever or chills. Last CT scan in January; next scheduled for April. Inquired about returning to work, feasible depending on energy levels and next scan results. Long-term treatment necessary to control disease; stopping may lead to progression. Maintenance therapy may be considered if disease is controlled, potentially transitioning to oral medication instead of infusion. - Continue current chemotherapy regimen. - Schedule CT scan in April. - Discuss potential for maintenance therapy if disease is controlled. - Proceed with today's infusion at 1 PM. - Schedule follow-up appointment in two weeks.  Chemotherapy-induced nausea Experiences nausea related to chemotherapy. Previously prescribed Zofran and Compazine for management. Confusion about Zofran use, permissible after infusion and for two days following. - Prescribe Zofran for nausea management, to be taken after the infusion and for two days following. - Consider prescribing Compazine if Zofran is insufficient.  Chemotherapy-induced neuropathy Reports occasional numbness and tingling, symptoms of chemotherapy-induced neuropathy. Symptoms are mild and not persistent.  Dental care during chemotherapy Inquired about dental care during chemotherapy. Routine cleaning and exams permissible, but invasive procedures may require antibiotics due to increased bleeding risk. - Advise her to inform dentist about chemotherapy if invasive procedures are needed.      Plan -She is clinically stable, tolerating treatment well overall. -Lab reviewed, adequate for treatment, will proceed chemo today at the same dose -Follow-up in 2 weeks.   SUMMARY OF ONCOLOGIC HISTORY: Oncology History  Cancer of ileum with carcinomatosis  07/10/2023 Initial Diagnosis   Cancer of ileum with carcinomatosis   07/22/2023 Cancer Staging   Staging form: Small Intestine - Adenocarcinoma, AJCC 8th Edition - Pathologic: Stage  IV (pT4,  pNX, pM1) - Signed by Malachy Mood, MD on 07/22/2023 Histologic grade (G): G2 Histologic grading system: 4 grade system   07/31/2023 - 08/16/2023 Chemotherapy   Patient is on Treatment Plan : COLORECTAL FOLFOX q14d     08/28/2023 -  Chemotherapy   Patient is on Treatment Plan : COLORECTAL CAPEOX (130/850) q21d x 8 cycles        Discussed the use of AI scribe software for clinical note transcription with the patient, who gave verbal consent to proceed.  History of Present Illness   The patient, with a history of metastatic colon cancer, presents for a follow-up visit after her last cycle of chemotherapy. She reports experiencing fatigue after treatment but notes that her appetite remains good and she recovers by the second week. She denies any other issues related to the treatment. She inquires about the possibility of undergoing dental procedures during her chemotherapy treatment. 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         All other systems were reviewed with the patient and are negative.  MEDICAL HISTORY:  Past Medical History:  Diagnosis Date   Abnormal uterine bleeding 04/10/2022   Anemia    Hypertension    Pre-diabetes 02/2022    SURGICAL HISTORY: Past Surgical History:  Procedure Laterality Date   BREAST LUMPECTOMY WITH RADIOACTIVE SEED LOCALIZATION Left 06/10/2022   Procedure: LEFT BREAST LUMPECTOMY WITH RADIOACTIVE SEED LOCALIZATION;  Surgeon: Griselda Miner, MD;  Location: [facility];  Service: General;  Laterality: Left;   CESAREAN SECTION     x4   IR IMAGING GUIDED PORT INSERTION  07/08/2023   LAPAROTOMY N/A 06/30/2023   Procedure: EXPLORATORY LAPAROTOMY, SMALL BOWEL RESECTION, AND EXCISIONAL BIOPSIES OF MULTIPLE OMENTAL MASSES;  Surgeon: Berna Bue, MD;   Location: [facility];  Service: General;  Laterality: N/A;   TUBAL LIGATION      I have reviewed the social history and family history with the patient and they are unchanged from previous note.  ALLERGIES:  has no known allergies.  MEDICATIONS:  Current Outpatient Medications  Medication Sig Dispense Refill   acetaminophen (TYLENOL) 500 MG tablet Take 2 tablets (1,000 mg total) by mouth every 8 (eight) hours as needed for mild pain or fever.     capecitabine (XELODA) 500 MG tablet TAKE  2 tabs in morning and 3 tabs in evening, every 10-12 hours. Take within 30 minutes after meal. Take for 7 days on, then 7 days off, repeat every 14 days. 70 tablet 1   diclofenac Sodium (VOLTAREN) 1 % GEL Research Patient: Apply 0.5 grams (1 fingertip) to each hand and each foot twice daily for up to 12 weeks 400 g 0   Lacosamide 100 MG TABS Take 1 tablet (100 mg total) by mouth 2 (two) times daily. 60 tablet 3   levETIRAcetam (KEPPRA) 1000 MG tablet Take 1 tablet (1,000 mg total) by mouth 2 (two) times daily. 120 tablet 0   levETIRAcetam (KEPPRA) 1000 MG tablet Take 1 tablet (1,000 mg total) by mouth 2 (two) times daily. 60 tablet 0   LORazepam (ATIVAN) 1 MG tablet Take 1 tablet (1 mg total) by mouth every 6 (six) hours as needed for anxiety. 30 tablet 0   methocarbamol (ROBAXIN) 500 MG tablet Take 2 tablets (1,000 mg total) by  mouth every 8 (eight) hours as needed for muscle spasms. 30 tablet 0   nitroGLYCERIN (NITROSTAT) 0.4 MG SL tablet Place 1 tablet (0.4 mg total) under the tongue every 5 (five) minutes as needed for chest pain. 25 tablet 0   ondansetron (ZOFRAN) 8 MG tablet Take 1 tablet (8 mg total) by mouth every 8 (eight) hours as needed for nausea or vomiting. 30 tablet 2   oxyCODONE (OXY IR/ROXICODONE) 5 MG immediate release tablet Take 1-2 tablets (5-10 mg total) by mouth every 6 (six) hours as needed for moderate pain or severe pain (5mg  moderate, 10mg  severe). (Patient taking differently: Take 5 mg by  mouth every 6 (six) hours as needed for moderate pain (pain score 4-6) or severe pain (pain score 7-10).) 60 tablet 0   polyethylene glycol powder (GLYCOLAX/MIRALAX) 17 GM/SCOOP powder Take 17 g by mouth daily mixed in liquid as directed. 238 g 0   simethicone (MYLICON) 80 MG chewable tablet Chew 1 tablet (80 mg total) by mouth 4 (four) times daily as needed for flatulence. 100 tablet 0   valsartan (DIOVAN) 40 MG tablet Take 1 tablet (40 mg total) by mouth daily. 90 tablet 2   No current facility-administered medications for this visit.   Facility-Administered Medications Ordered in Other Visits  Medication Dose Route Frequency Provider Last Rate Last Admin   dextrose 5 % solution   Intravenous Continuous Malachy Mood, MD 10 mL/hr at 12/31/23 1252 New Bag at 12/31/23 1252   oxaliplatin (ELOXATIN) 150 mg in dextrose 5 % 500 mL chemo infusion  85 mg/m2 (Treatment Plan Recorded) Intravenous Once Malachy Mood, MD 265 mL/hr at 12/31/23 1359 150 mg at 12/31/23 1359    PHYSICAL EXAMINATION: ECOG PERFORMANCE STATUS: 1 - Symptomatic but completely ambulatory  Vitals:   12/31/23 1227  BP: (!) 150/92  Pulse: (!) 101  Resp: 20  Temp: 98 F (36.7 C)  SpO2: 99%   Wt Readings from Last 3 Encounters:  12/31/23 200 lb 8 oz (90.9 kg)  12/17/23 200 lb 14.4 oz (91.1 kg)  12/03/23 196 lb 14.4 oz (89.3 kg)     GENERAL:alert, no distress and comfortable SKIN: skin color, texture, turgor are normal, no rashes or significant lesions EYES: normal, Conjunctiva are pink and non-injected, sclera clear NECK: supple, thyroid normal size, non-tender, without nodularity LYMPH:  no palpable lymphadenopathy in the cervical, axillary  LUNGS: clear to auscultation and percussion with normal breathing effort HEART: regular rate & rhythm and no murmurs and no lower extremity edema ABDOMEN:abdomen soft, non-tender and normal bowel sounds Musculoskeletal:no cyanosis of digits and no clubbing  NEURO: alert & oriented x 3  with fluent speech, no focal motor/sensory deficits    LABORATORY DATA:  I have reviewed the data as listed    Latest Ref Rng & Units 12/31/2023   12:04 PM 12/17/2023    8:57 AM 12/03/2023   11:26 AM  CBC  WBC 4.0 - 10.5 K/uL 6.9  8.4  8.6   Hemoglobin 12.0 - 15.0 g/dL 16.1  09.6  04.5   Hematocrit 36.0 - 46.0 % 40.7  39.9  41.4   Platelets 150 - 400 K/uL 259  259  337         Latest Ref Rng & Units 12/31/2023   12:04 PM 12/17/2023    8:57 AM 12/03/2023   11:26 AM  CMP  Glucose 70 - 99 mg/dL 409  94  96   BUN 6 - 20 mg/dL 7  9  11  Creatinine 0.44 - 1.00 mg/dL 1.61  0.96  0.45   Sodium 135 - 145 mmol/L 138  138  137   Potassium 3.5 - 5.1 mmol/L 3.3  3.6  3.7   Chloride 98 - 111 mmol/L 104  105  103   CO2 22 - 32 mmol/L 29  27  27    Calcium 8.9 - 10.3 mg/dL 8.9  9.2  9.2   Total Protein 6.5 - 8.1 g/dL  7.8    Total Bilirubin 0.0 - 1.2 mg/dL  0.7    Alkaline Phos 38 - 126 U/L  78    AST 15 - 41 U/L  22    ALT 0 - 44 U/L  16        RADIOGRAPHIC STUDIES: I have personally reviewed the radiological images as listed and agreed with the findings in the report. No results found.    No orders of the defined types were placed in this encounter.  All questions were answered. The patient knows to call the clinic with any problems, questions or concerns. No barriers to learning was detected. The total time spent in the appointment was 25 minutes.     Malachy Mood, MD 12/31/2023

## 2024-01-01 ENCOUNTER — Ambulatory Visit: Payer: BC Managed Care – PPO

## 2024-01-01 ENCOUNTER — Other Ambulatory Visit: Payer: BC Managed Care – PPO

## 2024-01-01 ENCOUNTER — Ambulatory Visit: Payer: BC Managed Care – PPO | Admitting: Nurse Practitioner

## 2024-01-07 ENCOUNTER — Ambulatory Visit: Payer: BC Managed Care – PPO | Admitting: Hematology

## 2024-01-07 ENCOUNTER — Other Ambulatory Visit: Payer: BC Managed Care – PPO

## 2024-01-07 ENCOUNTER — Ambulatory Visit: Payer: BC Managed Care – PPO

## 2024-01-12 ENCOUNTER — Other Ambulatory Visit: Payer: Self-pay

## 2024-01-12 NOTE — Progress Notes (Signed)
 Specialty Pharmacy Refill Coordination Note  Linda [name] is a 58 y.o. female contacted today regarding refills of specialty medication(s) Capecitabine (XELODA)   Patient requested Delivery   Delivery date: 01/23/24   Verified address: [address] [place], [place], [place]   Medication will be filled on 01/22/24.   Next cycle begins 4/9.

## 2024-01-13 NOTE — Assessment & Plan Note (Signed)
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  [name] in the liver MRI also showed a 1.8 cm oligo met in segment 7 of liver. -We discussed the incurable nature of her metastatic disease, due to her metastasis in peritoneum and liver.  We also discussed the small possibility of HIPEC and liver directed therapy if she has excellent response to chemotherapy. -I recommend first-line chemotherapy FOLFOX.  She started on July 31, 2023, oxaliplatin was held for first cycle due to open wound. -She developed seizure activity after first cycle of 5-FU infusion. She was evaluated by new oncologist Dr. Barbaraann Cao, her seizure was felt to be unlikely related to 5-FU. She felt that seizure was coming after cycle 2 chemo when she coming in for pump d/c and required ativan. I changed her cehmo to CAPOX on 08/28/2023. She required dose reduction  -restaging CT 11/11/2023 showed mixed response with improved liver met and slightly worse peritoneal mets, I slightly increased her xeloda dose

## 2024-01-14 ENCOUNTER — Encounter: Payer: Self-pay | Admitting: Hematology

## 2024-01-14 ENCOUNTER — Inpatient Hospital Stay: Payer: BC Managed Care – PPO

## 2024-01-14 ENCOUNTER — Inpatient Hospital Stay (HOSPITAL_BASED_OUTPATIENT_CLINIC_OR_DEPARTMENT_OTHER): Payer: BC Managed Care – PPO | Admitting: Hematology

## 2024-01-14 VITALS — BP 148/70 | HR 68 | Temp 97.8°F | Resp 18 | Wt 202.7 lb

## 2024-01-14 DIAGNOSIS — C172 Malignant neoplasm of ileum: Secondary | ICD-10-CM

## 2024-01-14 DIAGNOSIS — Z5111 Encounter for antineoplastic chemotherapy: Secondary | ICD-10-CM | POA: Diagnosis not present

## 2024-01-14 DIAGNOSIS — Z95828 Presence of other vascular implants and grafts: Secondary | ICD-10-CM

## 2024-01-14 LAB — CBC WITH DIFFERENTIAL (CANCER CENTER ONLY)
Abs Immature Granulocytes: 0.04 10*3/uL (ref 0.00–0.07)
Basophils Absolute: 0 10*3/uL (ref 0.0–0.1)
Basophils Relative: 0 %
Eosinophils Absolute: 0.2 10*3/uL (ref 0.0–0.5)
Eosinophils Relative: 2 %
HCT: 40.7 % (ref 36.0–46.0)
Hemoglobin: 13.7 g/dL (ref 12.0–15.0)
Immature Granulocytes: 0 %
Lymphocytes Relative: 28 %
Lymphs Abs: 2.6 10*3/uL (ref 0.7–4.0)
MCH: 31.9 pg (ref 26.0–34.0)
MCHC: 33.7 g/dL (ref 30.0–36.0)
MCV: 94.9 fL (ref 80.0–100.0)
Monocytes Absolute: 0.9 10*3/uL (ref 0.1–1.0)
Monocytes Relative: 10 %
Neutro Abs: 5.4 10*3/uL (ref 1.7–7.7)
Neutrophils Relative %: 60 %
Platelet Count: 237 10*3/uL (ref 150–400)
RBC: 4.29 MIL/uL (ref 3.87–5.11)
RDW: 15.6 % — ABNORMAL HIGH (ref 11.5–15.5)
WBC Count: 9.1 10*3/uL (ref 4.0–10.5)
nRBC: 0 % (ref 0.0–0.2)

## 2024-01-14 LAB — BASIC METABOLIC PANEL - CANCER CENTER ONLY
Anion gap: 6 (ref 5–15)
BUN: 8 mg/dL (ref 6–20)
CO2: 27 mmol/L (ref 22–32)
Calcium: 9.3 mg/dL (ref 8.9–10.3)
Chloride: 105 mmol/L (ref 98–111)
Creatinine: 0.74 mg/dL (ref 0.44–1.00)
GFR, Estimated: >60 mL/min (ref >60)
Glucose, Bld: 98 mg/dL (ref 70–99)
Potassium: 3.5 mmol/L (ref 3.5–5.1)
Sodium: 138 mmol/L (ref 135–145)

## 2024-01-14 MED ORDER — DEXAMETHASONE SODIUM PHOSPHATE 10 MG/ML IJ SOLN
10.0000 mg | Freq: Once | INTRAMUSCULAR | Status: AC
  Administered 2024-01-14: 10 mg via INTRAVENOUS

## 2024-01-14 MED ORDER — PALONOSETRON HCL INJECTION 0.25 MG/5ML
0.2500 mg | Freq: Once | INTRAVENOUS | Status: AC
  Administered 2024-01-14: 0.25 mg via INTRAVENOUS

## 2024-01-14 MED ORDER — OXALIPLATIN CHEMO INJECTION 100 MG/20ML
85.0000 mg/m2 | Freq: Once | INTRAVENOUS | Status: AC
  Administered 2024-01-14: 150 mg via INTRAVENOUS
  Filled 2024-01-14: qty 29.15

## 2024-01-14 MED ORDER — SODIUM CHLORIDE 0.9% FLUSH
10.0000 mL | Freq: Once | INTRAVENOUS | Status: AC
  Administered 2024-01-14: 10 mL

## 2024-01-14 MED ORDER — DEXTROSE 5 % IV SOLN: INTRAVENOUS | Status: DC

## 2024-01-14 NOTE — Progress Notes (Signed)
 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  [name] in the liver MRI also showed a 1.8 cm oligo met in segment 7 of liver. -We discussed the incurable nature of her metastatic disease, due to her metastasis in peritoneum and liver.  We also discussed the small possibility of HIPEC and liver directed therapy if she has excellent response to chemotherapy. -I recommend first-line chemotherapy FOLFOX.  She started on July 31, 2023, oxaliplatin was held for first cycle due to open wound. -She developed seizure activity after first cycle of 5-FU infusion. She was evaluated by new oncologist Dr. Barbaraann Cao, her seizure was felt to be unlikely related to 5-FU. She felt that seizure was coming after cycle 2 chemo when she coming in for pump d/c and required ativan. I changed her cehmo to CAPOX on 08/28/2023. She required dose reduction  -restaging CT 11/11/2023 showed mixed response with improved liver met and slightly worse peritoneal mets, I slightly increased her xeloda dose     Assessment and Plan    Metastatic small bowel [name] Undergoing treatment with chemotherapy. Experiences fatigue and occasional vomiting during infusion and oral medication phase,  improving in the second week. Maintains weight with no significant pain or nausea during the off week. Blood counts, kidney, and liver functions are normal. Potassium levels are low normal without supplementation. Cold sensitivity present without numbness, tingling, or functional impairment. Skin is well-managed with no swelling. - Administer same dose of chemotherapy today. - Schedule CT scan between April 14 and April 18 to assess treatment efficacy. - Plan additional chemotherapy cycles on April 9 and April 23. - Encourage dietary intake of potassium-rich foods such as bananas, oranges, and nuts. - Ensure scheduling provides a printout of the treatment calendar. - Provide radiology scheduling contact information for CT scan coordination.  Cold sensitivity Reports cold sensitivity when touching cold objects without associated numbness, tingling, or functional impairment.     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  History of Present Illness   The patient, a 58 year old with metastatic small bowel Linda [name],  presents for a follow-up visit. She reports fatigue and occasional vomiting during her infusion and oral medication regimen. Despite these side effects, she has been able to maintain her weight and her symptoms improve during the second week of treatment. She denies any recent seizures. She experiences cold sensitivity but denies any numbness or tingling, except when touching something cold. She has no difficulty with hand function or balance. During the off week of the chemotherapy pill, she reports no pain or nausea and has good energy levels. She has not been taking potassium supplements, but her potassium levels have returned to the low side of normal.         All other systems were reviewed with the patient and are negative.  MEDICAL HISTORY:  Past Medical History:  Diagnosis Date   Abnormal uterine bleeding 04/10/2022   Anemia    Hypertension    Pre-diabetes 02/2022    SURGICAL HISTORY: Past Surgical History:  Procedure Laterality Date   BREAST LUMPECTOMY WITH RADIOACTIVE SEED LOCALIZATION Left 06/10/2022   Procedure: LEFT BREAST LUMPECTOMY WITH RADIOACTIVE SEED LOCALIZATION;  Surgeon: Griselda Miner, MD;  Location: [facility];  Service: General;  Laterality: Left;   CESAREAN SECTION     x4   IR IMAGING GUIDED PORT INSERTION  07/08/2023   LAPAROTOMY N/A 06/30/2023   Procedure: EXPLORATORY LAPAROTOMY, SMALL BOWEL RESECTION, AND EXCISIONAL BIOPSIES OF MULTIPLE OMENTAL MASSES;  Surgeon: Berna Bue, MD;  Location: [facility];  Service: General;  Laterality: N/A;   TUBAL LIGATION      I have reviewed the social history and family history with the patient and they are unchanged from previous note.  ALLERGIES:  has no known allergies.  MEDICATIONS:  Current Outpatient Medications  Medication Sig Dispense Refill   acetaminophen (TYLENOL) 500 MG tablet Take 2 tablets (1,000 mg total) by mouth every 8 (eight) hours as needed for mild pain or fever.     capecitabine  (XELODA) 500 MG tablet TAKE  2 tabs in morning and 3 tabs in evening, every 10-12 hours. Take within 30 minutes after meal. Take for 7 days on, then 7 days off, repeat every 14 days. Linda tablet 1   diclofenac Sodium (VOLTAREN) 1 % GEL Research Patient: Apply 0.5 grams (1 fingertip) to each hand and each foot twice daily for up to 12 weeks 400 g 0   Lacosamide 100 MG TABS Take 1 tablet (100 mg total) by mouth 2 (two) times daily. 60 tablet 3   levETIRAcetam (KEPPRA) 1000 MG tablet Take 1 tablet (1,000 mg total) by mouth 2 (two) times daily. 120 tablet 0   levETIRAcetam (KEPPRA) 1000 MG tablet Take 1 tablet (1,000 mg total) by mouth 2 (two) times daily. 60 tablet 0   LORazepam (ATIVAN) 1 MG tablet Take 1 tablet (1 mg total) by mouth every 6 (six) hours as needed for anxiety. 30 tablet 0   methocarbamol (ROBAXIN) 500 MG tablet Take 2 tablets (1,000 mg total) by mouth every 8 (eight) hours as needed for muscle spasms. 30 tablet 0   nitroGLYCERIN (NITROSTAT) 0.4 MG SL tablet Place 1 tablet (0.4 mg total) under the tongue every 5 (five) minutes as needed for chest pain. 25 tablet 0   ondansetron (ZOFRAN) 8 MG tablet Take 1 tablet (8 mg total) by mouth  every 8 (eight) hours as needed for nausea or vomiting. 30 tablet 2   oxyCODONE (OXY IR/ROXICODONE) 5 MG immediate release tablet Take 1-2 tablets (5-10 mg total) by mouth every 6 (six) hours as needed for moderate pain or severe pain (5mg  moderate, 10mg  severe). (Patient taking differently: Take 5 mg by mouth every 6 (six) hours as needed for moderate pain (pain score 4-6) or severe pain (pain score 7-10).) 60 tablet 0   polyethylene glycol powder (GLYCOLAX/MIRALAX) 17 GM/SCOOP powder Take 17 g by mouth daily mixed in liquid as directed. 238 g 0   simethicone (MYLICON) 80 MG chewable tablet Chew 1 tablet (80 mg total) by mouth 4 (four) times daily as needed for flatulence. 100 tablet 0   valsartan (DIOVAN) 40 MG tablet Take 1 tablet (40 mg total) by mouth daily.  90 tablet 2   No current facility-administered medications for this visit.   Facility-Administered Medications Ordered in Other Visits  Medication Dose Route Frequency Provider Last Rate Last Admin   dextrose 5 % solution   Intravenous Continuous Malachy Mood, MD   Stopped at 01/14/24 1334    PHYSICAL EXAMINATION: ECOG PERFORMANCE STATUS: 1 - Symptomatic but completely ambulatory  Vitals:   01/14/24 0934 01/14/24 0935  BP: (!) 168/82 (!) 148/Linda  Pulse: 68   Resp: 18   Temp: 97.8 F (36.6 C)   SpO2: 100%    Wt Readings from Last 3 Encounters:  01/14/24 202 lb 11.2 oz (91.9 kg)  12/31/23 200 lb 8 oz (90.9 kg)  12/17/23 200 lb 14.4 oz (91.1 kg)     GENERAL:alert, no distress and comfortable SKIN: skin color, texture, turgor are normal, no rashes or significant lesions EYES: normal, Conjunctiva are pink and non-injected, sclera clear NECK: supple, thyroid normal size, non-tender, without nodularity LYMPH:  no palpable lymphadenopathy in the cervical, axillary  LUNGS: clear to auscultation and percussion with normal breathing effort HEART: regular rate & rhythm and no murmurs and no lower extremity edema ABDOMEN:abdomen soft, non-tender and normal bowel sounds Musculoskeletal:no cyanosis of digits and no clubbing  NEURO: alert & oriented x 3 with fluent speech, no focal motor/sensory deficits      LABORATORY DATA:  I have reviewed the data as listed    Latest Ref Rng & Units 01/14/2024    9:06 AM 12/31/2023   12:04 PM 12/17/2023    8:57 AM  CBC  WBC 4.0 - 10.5 K/uL 9.1  6.9  8.4   Hemoglobin 12.0 - 15.0 g/dL 16.1  09.6  04.5   Hematocrit 36.0 - 46.0 % 40.7  40.7  39.9   Platelets 150 - 400 K/uL 237  259  259         Latest Ref Rng & Units 01/14/2024    9:06 AM 12/31/2023   12:04 PM 12/17/2023    8:57 AM  CMP  Glucose Linda - 99 mg/dL 98  409  94   BUN 6 - 20 mg/dL 8  7  9    Creatinine 0.44 - 1.00 mg/dL 8.11  9.14  7.82   Sodium 135 - 145 mmol/L 138  138  138    Potassium 3.5 - 5.1 mmol/L 3.5  3.3  3.6   Chloride 98 - 111 mmol/L 105  104  105   CO2 22 - 32 mmol/L 27  29  27    Calcium 8.9 - 10.3 mg/dL 9.3  8.9  9.2   Total Protein 6.5 - 8.1 g/dL  7.9  7.8  Total Bilirubin 0.0 - 1.2 mg/dL  0.7  0.7   Alkaline Phos 38 - 126 U/L  88  78   AST 15 - 41 U/L  32  22   ALT 0 - 44 U/L  21  16       RADIOGRAPHIC STUDIES: I have personally reviewed the radiological images as listed and agreed with the findings in the report. No results found.    Orders Placed This Encounter  Procedures   CT CHEST ABDOMEN PELVIS W CONTRAST    Standing Status:   Future    Expected Date:   02/04/2024    Expiration Date:   01/13/2025    If indicated for the ordered procedure, I authorize the administration of contrast media per Radiology protocol:   Yes    Does the patient have a contrast media/X-ray dye allergy?:   No    Is patient pregnant?:   No    Preferred imaging location?:   [facility]    If indicated for the ordered procedure, I authorize the administration of oral contrast media per Radiology protocol:   Yes   CBC with Differential ([name] Only)    Standing Status:   Future    Expected Date:   01/28/2024    Expiration Date:   01/27/2025   Basic Metabolic Panel - [name] Only    Standing Status:   Future    Expected Date:   01/28/2024    Expiration Date:   01/27/2025   CBC with Differential ([name])    Standing Status:   Future    Expected Date:   02/11/2024    Expiration Date:   02/10/2025   Basic Metabolic Panel - [name] Only    Standing Status:   Future    Expected Date:   02/11/2024    Expiration Date:   02/10/2025   CBC with Differential ([name])    Standing Status:   Future    Expected Date:   02/25/2024    Expiration Date:   02/24/2025   Basic Metabolic Panel - [name] Only    Standing Status:   Future    Expected Date:   02/25/2024    Expiration Date:   02/24/2025   All questions were answered.  The patient knows to call the clinic with any problems, questions or concerns. No barriers to learning was detected. The total time spent in the appointment was 25 minutes.     Malachy Mood, MD 01/14/2024

## 2024-01-19 ENCOUNTER — Inpatient Hospital Stay: Admitting: Licensed Clinical Social Worker

## 2024-01-19 ENCOUNTER — Other Ambulatory Visit: Payer: Self-pay

## 2024-01-19 DIAGNOSIS — C172 Malignant neoplasm of ileum: Secondary | ICD-10-CM

## 2024-01-19 NOTE — Progress Notes (Signed)
 [facility] CSW Progress Note  Visual merchandiser  received a call from pt regarding a letter confirming she is not able to work at this time due to active treatment.  Pt reports her short term disability benefits at work expired on March 14 and she has become aware she does not have long term disability benefits.  Pt states DSS is requesting the letter to expand her benefits now that her disability has ended through work.  Letter drafted and emailed to pt.  CSW also scanned a referral form for the [facility] to pt to obtain necessary signatures.  When the referral form is returned CSW will submit on behalf of pt.  CSW to remain available as appropriate throughout duration of treatment.        Linda Moulds, LCSW Clinical Social Worker [facility]    Patient is participating in a Managed Medicaid Plan:  Yes

## 2024-01-22 ENCOUNTER — Other Ambulatory Visit: Payer: Self-pay

## 2024-01-27 NOTE — Assessment & Plan Note (Signed)
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  [name] in the liver MRI also showed a 1.8 cm oligo met in segment 7 of liver. -We discussed the incurable nature of her metastatic disease, due to her metastasis in peritoneum and liver.  We also discussed the small possibility of HIPEC and liver directed therapy if she has excellent response to chemotherapy. -I recommend first-line chemotherapy FOLFOX.  She started on July 31, 2023, oxaliplatin was held for first cycle due to open wound. -She developed seizure activity after first cycle of 5-FU infusion. She was evaluated by new oncologist Dr. Barbaraann Cao, her seizure was felt to be unlikely related to 5-FU. She felt that seizure was coming after cycle 2 chemo when she coming in for pump d/c and required ativan. I changed her cehmo to CAPOX on 08/28/2023. She required dose reduction  -restaging CT 11/11/2023 showed mixed response with improved liver met and slightly worse peritoneal mets, I slightly increased her xeloda dose

## 2024-01-28 ENCOUNTER — Inpatient Hospital Stay: Attending: Hematology | Admitting: Hematology

## 2024-01-28 ENCOUNTER — Encounter: Payer: Self-pay | Admitting: Hematology

## 2024-01-28 ENCOUNTER — Inpatient Hospital Stay

## 2024-01-28 ENCOUNTER — Ambulatory Visit

## 2024-01-28 VITALS — BP 152/98 | HR 98 | Temp 98.1°F | Resp 20 | Ht 64.0 in | Wt 201.8 lb

## 2024-01-28 DIAGNOSIS — C787 Secondary malignant neoplasm of liver and intrahepatic bile duct: Secondary | ICD-10-CM | POA: Diagnosis present

## 2024-01-28 DIAGNOSIS — C172 Malignant neoplasm of ileum: Secondary | ICD-10-CM

## 2024-01-28 DIAGNOSIS — Z79899 Other long term (current) drug therapy: Secondary | ICD-10-CM | POA: Insufficient documentation

## 2024-01-28 DIAGNOSIS — Z95828 Presence of other vascular implants and grafts: Secondary | ICD-10-CM

## 2024-01-28 DIAGNOSIS — Z5111 Encounter for antineoplastic chemotherapy: Secondary | ICD-10-CM | POA: Insufficient documentation

## 2024-01-28 DIAGNOSIS — C786 Secondary malignant neoplasm of retroperitoneum and peritoneum: Secondary | ICD-10-CM | POA: Insufficient documentation

## 2024-01-28 LAB — CBC WITH DIFFERENTIAL (CANCER CENTER ONLY)
Abs Immature Granulocytes: 0.04 10*3/uL (ref 0.00–0.07)
Basophils Absolute: 0 10*3/uL (ref 0.0–0.1)
Basophils Relative: 0 %
Eosinophils Absolute: 0.1 10*3/uL (ref 0.0–0.5)
Eosinophils Relative: 1 %
HCT: 40.4 % (ref 36.0–46.0)
Hemoglobin: 14.1 g/dL (ref 12.0–15.0)
Immature Granulocytes: 0 %
Lymphocytes Relative: 22 %
Lymphs Abs: 2.1 10*3/uL (ref 0.7–4.0)
MCH: 32.3 pg (ref 26.0–34.0)
MCHC: 34.9 g/dL (ref 30.0–36.0)
MCV: 92.4 fL (ref 80.0–100.0)
Monocytes Absolute: 0.7 10*3/uL (ref 0.1–1.0)
Monocytes Relative: 8 %
Neutro Abs: 6.3 10*3/uL (ref 1.7–7.7)
Neutrophils Relative %: 69 %
Platelet Count: 236 10*3/uL (ref 150–400)
RBC: 4.37 MIL/uL (ref 3.87–5.11)
RDW: 15.2 % (ref 11.5–15.5)
WBC Count: 9.2 10*3/uL (ref 4.0–10.5)
nRBC: 0 % (ref 0.0–0.2)

## 2024-01-28 LAB — HEPATIC FUNCTION PANEL
ALT: 19 U/L (ref 0–44)
AST: 26 U/L (ref 15–41)
Albumin: 3.4 g/dL — ABNORMAL LOW (ref 3.5–5.0)
Alkaline Phosphatase: 90 U/L (ref 38–126)
Bilirubin, Direct: <0.1 mg/dL (ref 0.0–0.2)
Total Bilirubin: 0.5 mg/dL (ref 0.0–1.2)
Total Protein: 7.9 g/dL (ref 6.5–8.1)

## 2024-01-28 LAB — CEA (ACCESS): CEA (CHCC): 48.68 ng/mL — ABNORMAL HIGH (ref 0.00–5.00)

## 2024-01-28 LAB — PREGNANCY, URINE: Preg Test, Ur: NEGATIVE

## 2024-01-28 LAB — BASIC METABOLIC PANEL - CANCER CENTER ONLY
Anion gap: 9 (ref 5–15)
BUN: 8 mg/dL (ref 6–20)
CO2: 27 mmol/L (ref 22–32)
Calcium: 9.1 mg/dL (ref 8.9–10.3)
Chloride: 103 mmol/L (ref 98–111)
Creatinine: 0.74 mg/dL (ref 0.44–1.00)
GFR, Estimated: >60 mL/min (ref >60)
Glucose, Bld: 172 mg/dL — ABNORMAL HIGH (ref 70–99)
Potassium: 3.1 mmol/L — ABNORMAL LOW (ref 3.5–5.1)
Sodium: 139 mmol/L (ref 135–145)

## 2024-01-28 MED ORDER — DEXTROSE 5 % IV SOLN: INTRAVENOUS | Status: DC

## 2024-01-28 MED ORDER — PALONOSETRON HCL INJECTION 0.25 MG/5ML
0.2500 mg | Freq: Once | INTRAVENOUS | Status: AC
  Administered 2024-01-28: 0.25 mg via INTRAVENOUS
  Filled 2024-01-28: qty 5

## 2024-01-28 MED ORDER — HEPARIN SOD (PORK) LOCK FLUSH 100 UNIT/ML IV SOLN
500.0000 [IU] | Freq: Once | INTRAVENOUS | Status: AC | PRN
  Administered 2024-01-28: 500 [IU]

## 2024-01-28 MED ORDER — DEXAMETHASONE SODIUM PHOSPHATE 10 MG/ML IJ SOLN
10.0000 mg | Freq: Once | INTRAMUSCULAR | Status: AC
  Administered 2024-01-28: 10 mg via INTRAVENOUS
  Filled 2024-01-28: qty 1

## 2024-01-28 MED ORDER — SODIUM CHLORIDE 0.9% FLUSH
10.0000 mL | Freq: Once | INTRAVENOUS | Status: AC
  Administered 2024-01-28: 10 mL

## 2024-01-28 MED ORDER — SODIUM CHLORIDE 0.9% FLUSH
10.0000 mL | INTRAVENOUS | Status: DC | PRN
  Administered 2024-01-28: 10 mL

## 2024-01-28 MED ORDER — DEXTROSE 5 % IV SOLN
85.0000 mg/m2 | Freq: Once | INTRAVENOUS | Status: AC
  Administered 2024-01-28: 150 mg via INTRAVENOUS
  Filled 2024-01-28: qty 10

## 2024-01-28 NOTE — Patient Instructions (Signed)
 [facility] - A DEPT OF [facility]  Discharge Instructions: Thank you for choosing [facility] to provide your oncology and hematology care.   If you have a lab appointment with the Cancer Center, please go directly to the Cancer Center and check in at the registration area.   Wear comfortable clothing and clothing appropriate for easy access to any Portacath or PICC line.   We strive to give you quality time with your provider. You may need to reschedule your appointment if you arrive late (15 or more minutes).  Arriving late affects you and other patients whose appointments are after yours.  Also, if you miss three or more appointments without notifying the office, you may be dismissed from the clinic at the provider's discretion.      For prescription refill requests, have your pharmacy contact our office and allow 72 hours for refills to be completed.    Today you received the following chemotherapy and/or immunotherapy agents: Oxaliplatin      To help prevent nausea and vomiting after your treatment, we encourage you to take your nausea medication as directed.  BELOW ARE SYMPTOMS THAT SHOULD BE REPORTED IMMEDIATELY: *FEVER GREATER THAN 100.4 F (38 C) OR HIGHER *CHILLS OR SWEATING *NAUSEA AND VOMITING THAT IS NOT CONTROLLED WITH YOUR NAUSEA MEDICATION *UNUSUAL SHORTNESS OF BREATH *UNUSUAL BRUISING OR BLEEDING *URINARY PROBLEMS (pain or burning when urinating, or frequent urination) *BOWEL PROBLEMS (unusual diarrhea, constipation, pain near the anus) TENDERNESS IN MOUTH AND THROAT WITH OR WITHOUT PRESENCE OF ULCERS (sore throat, sores in mouth, or a toothache) UNUSUAL RASH, SWELLING OR PAIN  UNUSUAL VAGINAL DISCHARGE OR ITCHING   Items with * indicate a potential emergency and should be followed up as soon as possible or go to the Emergency Department if any problems should occur.  Please show the CHEMOTHERAPY ALERT CARD or  IMMUNOTHERAPY ALERT CARD at check-in to the Emergency Department and triage nurse.  Should you have questions after your visit or need to cancel or reschedule your appointment, please contact [facility] - [facility]  Dept: [phone]  and follow the prompts.  Office hours are 8:00 a.m. to 4:30 p.m. Monday - Friday. Please note that voicemails left after 4:00 p.m. may not be returned until the following business day.  We are closed weekends and major holidays. You have access to a nurse at all times for urgent questions. Please call the main number to the clinic Dept: [phone] and follow the prompts.   For any non-urgent questions, you may also contact your provider using MyChart. We now offer e-Visits for anyone 36 and older to request care online for non-urgent symptoms. For details visit [email or web address].   Also download the MyChart app! Go to the app store, search "MyChart", open the app, select [place], and log in with your MyChart username and password.

## 2024-01-28 NOTE — Progress Notes (Signed)
 Continue Oxaliplatin 150mg  per Dr. Mosetta Putt.  Anola Gurney [place], [place], BCPS, BCOP 01/28/2024 10:31 AM

## 2024-01-28 NOTE — Progress Notes (Signed)
 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  [name] in the liver MRI also showed a 1.8 cm oligo met in segment 7 of liver. -We discussed the incurable nature of her metastatic disease, due to her metastasis in peritoneum and liver.  We also discussed the small possibility of HIPEC and liver directed therapy if she has excellent response to chemotherapy. -I recommend first-line chemotherapy FOLFOX.  She started on July 31, 2023, oxaliplatin was held for first cycle due to open wound. -She developed seizure activity after first cycle of 5-FU infusion. She was evaluated by new oncologist Dr. Barbaraann Cao, her seizure was felt to be unlikely related to 5-FU. She felt that seizure was coming after cycle 2 chemo when she coming in for pump d/c and required ativan. I changed her cehmo to CAPOX on 08/28/2023. She required dose reduction  -restaging CT 11/11/2023 showed mixed response with improved liver met and slightly worse peritoneal mets, I slightly increased her xeloda dose   Assessment & Plan Metastatic small bowel cancer Undergoing treatment with chemotherapy, currently on cycle nine. Reports typical side effects such as fatigue lasting about three days, but no severe adverse effects.  Blood counts are normal, and urine test is negative. Last CEA tumor marker was stable at 41. Experiences some bloating and numbness in toes, but no seizures, fever, or chills. Treatment will continue as long as kidney and liver functions remain stable. CT scan scheduled next week to assess disease status. If changes are needed based on scan results, alternative treatment options will be discussed. - Continue current chemotherapy regimen with two pills in the morning and three in the evening for seven days on, seven days off. - Monitor kidney and liver function tests. - Review CT scan results next week to assess disease status. - Schedule follow-up appointment in two weeks to review scan results and discuss potential changes in treatment if necessary.  Seizure disorder Reports no recent seizure activity. Continues on seizure medication, although one medication has been discontinued. - Continue current seizure medication regimen.  Hypertension Requires a refill for her antihypertensive medication. - Refill antihypertensive medication as requested.  Plan -He is clinically doing well, tolerating treatment well. -Lab reviewed, adequate for treatment, will proceed chemo today and continue every 2 weeks -She is scheduled for restaging CT scan next week   SUMMARY OF ONCOLOGIC HISTORY: Oncology History  Cancer of ileum with carcinomatosis  07/10/2023 Initial Diagnosis   Cancer of ileum with carcinomatosis   07/22/2023 Cancer Staging   Staging form: Small Intestine - Adenocarcinoma, AJCC 8th Edition - Pathologic: Stage IV (pT4, pNX, pM1) - Signed by Malachy Mood, MD on 07/22/2023 Histologic grade (G): G2 Histologic grading system: 4 grade system   07/31/2023 - 08/16/2023 Chemotherapy   Patient is on Treatment Plan : COLORECTAL FOLFOX q14d  08/28/2023 -  Chemotherapy   Patient is on Treatment Plan : COLORECTAL CAPEOX (130/850) q21d x 8 cycles        Discussed the use of AI scribe software  for clinical note transcription with the patient, who gave verbal consent to proceed.  History of Present Illness The patient, with a history of metastatic small bowel cancer, presents for a routine follow-up after undergoing chemotherapy. She reports experiencing fatigue as a side effect of the treatment, which lasts for about three days after each session. She denies any other problems related to the chemotherapy. She also reports no recent seizures, fever, or chills. The patient mentions experiencing bloating occasionally, but it is not severe. She also reports numbness in her toes. The patient is currently on medication for blood pressure and seizures, and she has enough supply of her chemotherapy pills for the current cycle. She also mentions that she has been taken off one of her seizure medications.     All other systems were reviewed with the patient and are negative.  MEDICAL HISTORY:  Past Medical History:  Diagnosis Date   Abnormal uterine bleeding 04/10/2022   Anemia    Hypertension    Pre-diabetes 02/2022    SURGICAL HISTORY: Past Surgical History:  Procedure Laterality Date   BREAST LUMPECTOMY WITH RADIOACTIVE SEED LOCALIZATION Left 06/10/2022   Procedure: LEFT BREAST LUMPECTOMY WITH RADIOACTIVE SEED LOCALIZATION;  Surgeon: Griselda Miner, MD;  Location: [facility];  Service: General;  Laterality: Left;   CESAREAN SECTION     x4   IR IMAGING GUIDED PORT INSERTION  07/08/2023   LAPAROTOMY N/A 06/30/2023   Procedure: EXPLORATORY LAPAROTOMY, SMALL BOWEL RESECTION, AND EXCISIONAL BIOPSIES OF MULTIPLE OMENTAL MASSES;  Surgeon: Berna Bue, MD;  Location: [facility];  Service: General;  Laterality: N/A;   TUBAL LIGATION      I have reviewed the social history and family history with the patient and they are unchanged from previous note.  ALLERGIES:  has no known allergies.  MEDICATIONS:  Current Outpatient Medications  Medication Sig Dispense Refill    acetaminophen (TYLENOL) 500 MG tablet Take 2 tablets (1,000 mg total) by mouth every 8 (eight) hours as needed for mild pain or fever.     capecitabine (XELODA) 500 MG tablet TAKE  2 tabs in morning and 3 tabs in evening, every 10-12 hours. Take within 30 minutes after meal. Take for 7 days on, then 7 days off, repeat every 14 days. 70 tablet 1   diclofenac Sodium (VOLTAREN) 1 % GEL Research Patient: Apply 0.5 grams (1 fingertip) to each hand and each foot twice daily for up to 12 weeks 400 g 0   Lacosamide 100 MG TABS Take 1 tablet (100 mg total) by mouth 2 (two) times daily. 60 tablet 3   levETIRAcetam (KEPPRA) 1000 MG tablet Take 1 tablet (1,000 mg total) by mouth 2 (two) times daily. 120 tablet 0   levETIRAcetam (KEPPRA) 1000 MG tablet Take 1 tablet (1,000 mg total) by mouth 2 (two) times daily. 60 tablet 0   LORazepam (ATIVAN) 1 MG tablet Take 1 tablet (1 mg total) by mouth every 6 (six) hours as needed for anxiety. 30 tablet 0   methocarbamol (ROBAXIN) 500 MG tablet Take 2 tablets (1,000 mg total) by mouth every 8 (eight) hours as needed for muscle spasms. 30 tablet 0   nitroGLYCERIN (NITROSTAT) 0.4 MG SL tablet Place 1 tablet (0.4 mg total) under the tongue every 5 (five) minutes as  needed for chest pain. 25 tablet 0   ondansetron (ZOFRAN) 8 MG tablet Take 1 tablet (8 mg total) by mouth every 8 (eight) hours as needed for nausea or vomiting. 30 tablet 2   oxyCODONE (OXY IR/ROXICODONE) 5 MG immediate release tablet Take 1-2 tablets (5-10 mg total) by mouth every 6 (six) hours as needed for moderate pain or severe pain (5mg  moderate, 10mg  severe). (Patient taking differently: Take 5 mg by mouth every 6 (six) hours as needed for moderate pain (pain score 4-6) or severe pain (pain score 7-10).) 60 tablet 0   polyethylene glycol powder (GLYCOLAX/MIRALAX) 17 GM/SCOOP powder Take 17 g by mouth daily mixed in liquid as directed. 238 g 0   simethicone (MYLICON) 80 MG chewable tablet Chew 1 tablet (80 mg  total) by mouth 4 (four) times daily as needed for flatulence. 100 tablet 0   valsartan (DIOVAN) 40 MG tablet Take 1 tablet (40 mg total) by mouth daily. 90 tablet 2   No current facility-administered medications for this visit.   Facility-Administered Medications Ordered in Other Visits  Medication Dose Route Frequency Provider Last Rate Last Admin   dextrose 5 % solution   Intravenous Continuous Malachy Mood, MD 10 mL/hr at 01/28/24 1019 New Bag at 01/28/24 1019   heparin lock flush 100 unit/mL  500 Units Intracatheter Once PRN Malachy Mood, MD       oxaliplatin (ELOXATIN) 150 mg in dextrose 5 % 500 mL chemo infusion  85 mg/m2 (Treatment Plan Recorded) Intravenous Once Malachy Mood, MD       sodium chloride flush (NS) 0.9 % injection 10 mL  10 mL Intracatheter PRN Malachy Mood, MD        PHYSICAL EXAMINATION: ECOG PERFORMANCE STATUS: 1 - Symptomatic but completely ambulatory  Vitals:   01/28/24 0953 01/28/24 0954  BP: (!) 156/100 (!) 152/98  Pulse: 98   Resp: 20   Temp: 98.1 F (36.7 C)   SpO2: 98%    Wt Readings from Last 3 Encounters:  01/28/24 201 lb 12.8 oz (91.5 kg)  01/14/24 202 lb 11.2 oz (91.9 kg)  12/31/23 200 lb 8 oz (90.9 kg)    GENERAL:alert, no distress and comfortable SKIN: skin color, texture, turgor are normal, no rashes or significant lesions EYES: normal, Conjunctiva are pink and non-injected, sclera clear NECK: supple, thyroid normal size, non-tender, without nodularity LYMPH:  no palpable lymphadenopathy in the cervical, axillary  LUNGS: clear to auscultation and percussion with normal breathing effort HEART: regular rate & rhythm and no murmurs and no lower extremity edema ABDOMEN:abdomen soft, non-tender and normal bowel sounds Musculoskeletal:no cyanosis of digits and no clubbing  NEURO: alert & oriented x 3 with fluent speech, no focal motor/sensory deficits  Physical Exam    LABORATORY DATA:  I have reviewed the data as listed    Latest Ref Rng & Units  01/28/2024    9:25 AM 01/14/2024    9:06 AM 12/31/2023   12:04 PM  CBC  WBC 4.0 - 10.5 K/uL 9.2  9.1  6.9   Hemoglobin 12.0 - 15.0 g/dL 16.1  09.6  04.5   Hematocrit 36.0 - 46.0 % 40.4  40.7  40.7   Platelets 150 - 400 K/uL 236  237  259         Latest Ref Rng & Units 01/28/2024    9:25 AM 01/14/2024    9:06 AM 12/31/2023   12:04 PM  CMP  Glucose 70 - 99 mg/dL 409  98  118   BUN 6 - 20 mg/dL 8  8  7    Creatinine 0.44 - 1.00 mg/dL 9.56  2.13  0.86   Sodium 135 - 145 mmol/L 139  138  138   Potassium 3.5 - 5.1 mmol/L 3.1  3.5  3.3   Chloride 98 - 111 mmol/L 103  105  104   CO2 22 - 32 mmol/L 27  27  29    Calcium 8.9 - 10.3 mg/dL 9.1  9.3  8.9   Total Protein 6.5 - 8.1 g/dL   7.9   Total Bilirubin 0.0 - 1.2 mg/dL   0.7   Alkaline Phos 38 - 126 U/L   88   AST 15 - 41 U/L   32   ALT 0 - 44 U/L   21       RADIOGRAPHIC STUDIES: I have personally reviewed the radiological images as listed and agreed with the findings in the report. No results found.    No orders of the defined types were placed in this encounter.  All questions were answered. The patient knows to call the clinic with any problems, questions or concerns. No barriers to learning was detected. The total time spent in the appointment was 25 minutes.     Malachy Mood, MD 01/28/2024

## 2024-02-03 ENCOUNTER — Other Ambulatory Visit (HOSPITAL_COMMUNITY): Payer: Self-pay

## 2024-02-03 ENCOUNTER — Other Ambulatory Visit: Payer: Self-pay

## 2024-02-03 ENCOUNTER — Telehealth: Payer: Self-pay

## 2024-02-03 MED ORDER — POTASSIUM CHLORIDE CRYS ER 20 MEQ PO TBCR
20.0000 meq | EXTENDED_RELEASE_TABLET | Freq: Every day | ORAL | 0 refills | Status: DC
  Filled 2024-02-03: qty 30, 30d supply, fill #0

## 2024-02-03 NOTE — Telephone Encounter (Signed)
 Spoke with pt via telephone regarding recent lab results.  Informed pt that Dr. Maryalice Smaller reviewed her K+ lab and wants the pt to take of K+ daily by mouth for 30 days.  Instructed pt to eat foods high in K+.  Provided pt with a list of foods high in K+.  Stated the prescription has been sent to the pt's [facility] pharmacy and instructed pt to start taking the K+ immediately.  Instructed pt to not crush or break the K+ tab.  Recommended pt to take tab in applesauce if the pt has difficulty swallowing the K+ tab.  Pt verbalized understanding and agreed with plan.

## 2024-02-03 NOTE — Progress Notes (Signed)
  Home prescription order placed for Klor-Con 20meq PO daily for 30 days.  Prescription sent to [facility] per pt's request.     Sonja [place], MD  P [facility] Mo Pod 1 Cc: Arlyss Laming, CMA Please call pt and let her know K is low, if she is not on oral KCL, please call in KCL 20meq daily #30 for her, thx  Sonja

## 2024-02-04 ENCOUNTER — Encounter (HOSPITAL_COMMUNITY): Payer: Self-pay

## 2024-02-04 ENCOUNTER — Ambulatory Visit (HOSPITAL_COMMUNITY)
Admission: RE | Admit: 2024-02-04 | Discharge: 2024-02-04 | Disposition: A | Discharge status: Home / Self Care | Source: Ambulatory Visit | Attending: Hematology | Admitting: Hematology

## 2024-02-04 DIAGNOSIS — C172 Malignant neoplasm of ileum: Secondary | ICD-10-CM | POA: Insufficient documentation

## 2024-02-04 MED ORDER — HEPARIN SOD (PORK) LOCK FLUSH 100 UNIT/ML IV SOLN
500.0000 [IU] | Freq: Once | INTRAVENOUS | Status: AC
  Administered 2024-02-04: 500 [IU] via INTRAVENOUS

## 2024-02-04 MED ORDER — HEPARIN SOD (PORK) LOCK FLUSH 100 UNIT/ML IV SOLN
INTRAVENOUS | Status: AC
Start: 2024-02-04 — End: ?
  Filled 2024-02-04: qty 5

## 2024-02-04 MED ORDER — IOHEXOL 9 MG/ML PO SOLN
500.0000 mL | ORAL | Status: AC
  Administered 2024-02-04: 1000 mL via ORAL

## 2024-02-04 MED ORDER — IOHEXOL 300 MG/ML  SOLN
100.0000 mL | Freq: Once | INTRAMUSCULAR | Status: AC | PRN
  Administered 2024-02-04: 100 mL via INTRAVENOUS

## 2024-02-04 MED ORDER — SODIUM CHLORIDE (PF) 0.9 % IJ SOLN
INTRAMUSCULAR | Status: AC
  Filled 2024-02-04: qty 50

## 2024-02-06 ENCOUNTER — Other Ambulatory Visit: Payer: Self-pay | Admitting: Internal Medicine

## 2024-02-06 ENCOUNTER — Other Ambulatory Visit (HOSPITAL_COMMUNITY): Payer: Self-pay

## 2024-02-06 MED ORDER — LACOSAMIDE 100 MG PO TABS
100.0000 mg | ORAL_TABLET | Freq: Two times a day (BID) | ORAL | 3 refills | Status: DC
  Filled 2024-02-06: qty 60, 30d supply, fill #0
  Filled 2024-03-23: qty 60, 30d supply, fill #1
  Filled 2024-04-29: qty 60, 30d supply, fill #2

## 2024-02-06 MED ORDER — LACOSAMIDE 100 MG PO TABS: 100.0000 mg | ORAL_TABLET | Freq: Two times a day (BID) | ORAL | 3 refills | Status: DC

## 2024-02-09 ENCOUNTER — Other Ambulatory Visit: Payer: Self-pay

## 2024-02-10 NOTE — Assessment & Plan Note (Signed)
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  [name] in the liver MRI also showed a 1.8 cm oligo met in segment 7 of liver. -We discussed the incurable nature of her metastatic disease, due to her metastasis in peritoneum and liver.  We also discussed the small possibility of HIPEC and liver directed therapy if she has excellent response to chemotherapy. -I recommend first-line chemotherapy FOLFOX.  She started on July 31, 2023, oxaliplatin  was held for first cycle due to open wound. -She developed seizure activity after first cycle of 5-FU infusion. She was evaluated by new oncologist Dr. Mark Sil, her seizure was felt to be unlikely related to 5-FU. She felt that seizure was coming after cycle 2 chemo when she coming in for pump d/c and required ativan . I changed her cehmo to CAPOX on 08/28/2023. She required dose reduction  -restaging CT 11/11/2023 showed mixed response with improved liver met and slightly worse peritoneal mets, I slightly increased her xeloda  dose  -restaging CT 02/04/2024 showed stable disease

## 2024-02-11 ENCOUNTER — Inpatient Hospital Stay

## 2024-02-11 ENCOUNTER — Other Ambulatory Visit

## 2024-02-11 ENCOUNTER — Inpatient Hospital Stay (HOSPITAL_BASED_OUTPATIENT_CLINIC_OR_DEPARTMENT_OTHER): Admitting: Hematology

## 2024-02-11 ENCOUNTER — Other Ambulatory Visit: Payer: Self-pay

## 2024-02-11 VITALS — BP 152/88 | HR 78 | Temp 98.2°F | Resp 22 | Ht 64.0 in | Wt 203.7 lb

## 2024-02-11 VITALS — Resp 19

## 2024-02-11 DIAGNOSIS — C172 Malignant neoplasm of ileum: Secondary | ICD-10-CM

## 2024-02-11 DIAGNOSIS — D500 Iron deficiency anemia secondary to blood loss (chronic): Secondary | ICD-10-CM

## 2024-02-11 DIAGNOSIS — Z5111 Encounter for antineoplastic chemotherapy: Secondary | ICD-10-CM | POA: Diagnosis not present

## 2024-02-11 LAB — CBC WITH DIFFERENTIAL (CANCER CENTER ONLY)
Abs Immature Granulocytes: 0.02 10*3/uL (ref 0.00–0.07)
Basophils Absolute: 0 10*3/uL (ref 0.0–0.1)
Basophils Relative: 1 %
Eosinophils Absolute: 0.1 10*3/uL (ref 0.0–0.5)
Eosinophils Relative: 2 %
HCT: 39.6 % (ref 36.0–46.0)
Hemoglobin: 13.6 g/dL (ref 12.0–15.0)
Immature Granulocytes: 0 %
Lymphocytes Relative: 26 %
Lymphs Abs: 2.2 10*3/uL (ref 0.7–4.0)
MCH: 32 pg (ref 26.0–34.0)
MCHC: 34.3 g/dL (ref 30.0–36.0)
MCV: 93.2 fL (ref 80.0–100.0)
Monocytes Absolute: 0.8 10*3/uL (ref 0.1–1.0)
Monocytes Relative: 10 %
Neutro Abs: 5.2 10*3/uL (ref 1.7–7.7)
Neutrophils Relative %: 61 %
Platelet Count: 228 10*3/uL (ref 150–400)
RBC: 4.25 MIL/uL (ref 3.87–5.11)
RDW: 15.4 % (ref 11.5–15.5)
WBC Count: 8.5 10*3/uL (ref 4.0–10.5)
nRBC: 0 % (ref 0.0–0.2)

## 2024-02-11 LAB — BASIC METABOLIC PANEL - CANCER CENTER ONLY
Anion gap: 6 (ref 5–15)
BUN: 7 mg/dL (ref 6–20)
CO2: 27 mmol/L (ref 22–32)
Calcium: 9.4 mg/dL (ref 8.9–10.3)
Chloride: 104 mmol/L (ref 98–111)
Creatinine: 0.73 mg/dL (ref 0.44–1.00)
GFR, Estimated: >60 mL/min (ref >60)
Glucose, Bld: 117 mg/dL — ABNORMAL HIGH (ref 70–99)
Potassium: 3.5 mmol/L (ref 3.5–5.1)
Sodium: 137 mmol/L (ref 135–145)

## 2024-02-11 LAB — FERRITIN: Ferritin: 209 ng/mL (ref 11–307)

## 2024-02-11 MED ORDER — DEXAMETHASONE SODIUM PHOSPHATE 10 MG/ML IJ SOLN
10.0000 mg | Freq: Once | INTRAMUSCULAR | Status: AC
  Administered 2024-02-11: 10 mg via INTRAVENOUS

## 2024-02-11 MED ORDER — DEXTROSE 5 % IV SOLN
INTRAVENOUS | Status: DC
Start: 2024-02-11 — End: 2024-02-11

## 2024-02-11 MED ORDER — PALONOSETRON HCL INJECTION 0.25 MG/5ML
0.2500 mg | Freq: Once | INTRAVENOUS | Status: AC
  Administered 2024-02-11: 0.25 mg via INTRAVENOUS

## 2024-02-11 MED ORDER — OXALIPLATIN CHEMO INJECTION 100 MG/20ML
85.0000 mg/m2 | Freq: Once | INTRAVENOUS | Status: AC
  Administered 2024-02-11: 150 mg via INTRAVENOUS
  Filled 2024-02-11: qty 10

## 2024-02-11 MED ORDER — SODIUM CHLORIDE 0.9% FLUSH
10.0000 mL | INTRAVENOUS | Status: DC | PRN
  Administered 2024-02-11: 10 mL

## 2024-02-11 MED ORDER — CAPECITABINE 500 MG PO TABS
ORAL_TABLET | ORAL | 1 refills | Status: DC
  Filled 2024-02-11: qty 70, fill #0
  Filled 2024-02-16: qty 70, 28d supply, fill #0
  Filled 2024-03-16: qty 70, 28d supply, fill #1

## 2024-02-11 MED ORDER — HEPARIN SOD (PORK) LOCK FLUSH 100 UNIT/ML IV SOLN
500.0000 [IU] | Freq: Once | INTRAVENOUS | Status: AC | PRN
  Administered 2024-02-11: 500 [IU]

## 2024-02-11 NOTE — Patient Instructions (Signed)
 [facility] - A DEPT OF [facility]  Discharge Instructions: Thank you for choosing [facility] to provide your oncology and hematology care.   If you have a lab appointment with the Cancer Center, please go directly to the Cancer Center and check in at the registration area.   Wear comfortable clothing and clothing appropriate for easy access to any Portacath or PICC line.   We strive to give you quality time with your provider. You may need to reschedule your appointment if you arrive late (15 or more minutes).  Arriving late affects you and other patients whose appointments are after yours.  Also, if you miss three or more appointments without notifying the office, you may be dismissed from the clinic at the provider's discretion.      For prescription refill requests, have your pharmacy contact our office and allow 72 hours for refills to be completed.    Today you received the following chemotherapy and/or immunotherapy agents: Oxaliplatin      To help prevent nausea and vomiting after your treatment, we encourage you to take your nausea medication as directed.  BELOW ARE SYMPTOMS THAT SHOULD BE REPORTED IMMEDIATELY: *FEVER GREATER THAN 100.4 F (38 C) OR HIGHER *CHILLS OR SWEATING *NAUSEA AND VOMITING THAT IS NOT CONTROLLED WITH YOUR NAUSEA MEDICATION *UNUSUAL SHORTNESS OF BREATH *UNUSUAL BRUISING OR BLEEDING *URINARY PROBLEMS (pain or burning when urinating, or frequent urination) *BOWEL PROBLEMS (unusual diarrhea, constipation, pain near the anus) TENDERNESS IN MOUTH AND THROAT WITH OR WITHOUT PRESENCE OF ULCERS (sore throat, sores in mouth, or a toothache) UNUSUAL RASH, SWELLING OR PAIN  UNUSUAL VAGINAL DISCHARGE OR ITCHING   Items with * indicate a potential emergency and should be followed up as soon as possible or go to the Emergency Department if any problems should occur.  Please show the CHEMOTHERAPY ALERT CARD or  IMMUNOTHERAPY ALERT CARD at check-in to the Emergency Department and triage nurse.  Should you have questions after your visit or need to cancel or reschedule your appointment, please contact [facility] - [facility]  Dept: [phone]  and follow the prompts.  Office hours are 8:00 a.m. to 4:30 p.m. Monday - Friday. Please note that voicemails left after 4:00 p.m. may not be returned until the following business day.  We are closed weekends and major holidays. You have access to a nurse at all times for urgent questions. Please call the main number to the clinic Dept: [phone] and follow the prompts.   For any non-urgent questions, you may also contact your provider using MyChart. We now offer e-Visits for anyone 36 and older to request care online for non-urgent symptoms. For details visit [email or web address].   Also download the MyChart app! Go to the app store, search "MyChart", open the app, select [place], and log in with your MyChart username and password.

## 2024-02-11 NOTE — Progress Notes (Signed)
 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  [name] in the liver MRI also showed a 1.8 cm oligo met in segment 7 of liver. -We discussed the incurable nature of her metastatic disease, due to her metastasis in peritoneum and liver.  We also discussed the small possibility of HIPEC and liver directed therapy if she has excellent response to chemotherapy. -I recommend first-line chemotherapy FOLFOX.  She started on July 31, 2023, oxaliplatin  was held for first cycle due to open wound. -She developed seizure activity after first cycle of 5-FU infusion. She was evaluated by new oncologist Dr. Mark Sil, her seizure was felt to be unlikely related to 5-FU. She felt that seizure was coming after cycle 2 chemo when she coming in for pump d/c and required ativan . I changed her cehmo to CAPOX on 08/28/2023. She required dose reduction  -restaging CT 11/11/2023 showed mixed response with improved liver met and slightly worse peritoneal mets, I slightly increased her xeloda  dose  -restaging CT 02/04/2024 showed stable disease  But Assessment & Plan Metastatic small bowel cancer Metastatic small bowel cancer with well-managed disease on current chemotherapy regimen. Recent CT scan  shows stable peritoneal nodules and a less visible small liver lesion. Blood counts are normal, and liver and kidney functions are good. CEA level is slightly elevated but unchanged from two months ago. She tolerates the current chemotherapy regimen well, with mild neuropathy as the only significant side effect. - Continue current chemotherapy regimen with Xeloda  (capecitabine ) two pills in the morning and three in the evening, seven days on and seven days off. - Schedule chemotherapy infusion every two weeks. - Order CT scan in three months unless new symptoms or significant increase in tumor markers occur. - Monitor CEA levels regularly. - Refill Xeloda  prescription through [facility] specialty pharmacy.  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 No balance issues or significant impairment reported. The neuropathy is a known side effect of the current chemotherapy regimen. - Advise her to report any increase in neuropathy symptoms.  Fatty liver Fatty liver noted on recent CT scan. No significant changes or concerns regarding liver function. - Continue monitoring liver function tests.  Plan - I reviewed her restaging CT scan from February 04, 2024, stable disease  - Will continue current dose CapeOx every 2 weeks - Labs reviewed, adequate for treatment, will proceed to treatment today - Follow-up in 2 weeks    SUMMARY OF ONCOLOGIC HISTORY: Oncology History  Cancer of ileum with carcinomatosis  07/10/2023 Initial Diagnosis   Cancer of ileum with carcinomatosis   07/22/2023 Cancer Staging   Staging form: Small Intestine - Adenocarcinoma, AJCC 8th Edition - Pathologic: Stage IV (pT4, pNX, pM1) - Signed by Sonja [place], MD on 07/22/2023 Histologic grade (G): G2 Histologic grading system: 4  grade system   07/31/2023 - 08/16/2023 Chemotherapy   Patient is on Treatment Plan : COLORECTAL FOLFOX q14d     08/28/2023 -   Chemotherapy   Patient is on Treatment Plan : COLORECTAL CAPEOX (130/850) q21d x 8 cycles        Discussed the use of AI scribe software for clinical note transcription with the patient, who gave verbal consent to proceed.  History of Present Illness The patient, a 58 year old with metastatic small bowel cancer, presents for a follow-up visit. She reports no problems from the last cycle of chemotherapy. However, she does experience cold sensitivity and numbness in her toes, which she describes as "not bad." She denies any issues with balance. She has no other symptoms, including nausea or pain, and reports a good appetite.  The patient recently underwent a CT scan, which showed overall stability in her condition. The peritoneal nodules remain the same size, and a previously seen small liver lesion was not visible on this scan. The patient also has some fatty liver.  The patient is currently on a chemotherapy regimen, which includes an infusion every two weeks and a seven-day-on, seven-day-off schedule with Xeloda . She reports no skin problems from the Xeloda . She also takes potassium.  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 She has applied for disability and Medicaid due to a lack of income. She also has a follow-up appointment with a neurologist due to a previous seizure-like activity.     All other systems were reviewed with the patient and are negative.  MEDICAL HISTORY:  Past Medical History:  Diagnosis Date   Abnormal uterine bleeding 04/10/2022   Anemia    Hypertension    Pre-diabetes 02/2022    SURGICAL HISTORY: Past Surgical History:  Procedure Laterality Date   BREAST LUMPECTOMY WITH RADIOACTIVE SEED LOCALIZATION Left 06/10/2022   Procedure: LEFT BREAST LUMPECTOMY WITH RADIOACTIVE SEED LOCALIZATION;  Surgeon: Caralyn Chandler, MD;  Location: [facility];  Service: General;   Laterality: Left;   CESAREAN SECTION     x4   IR IMAGING GUIDED PORT INSERTION  07/08/2023   LAPAROTOMY N/A 06/30/2023   Procedure: EXPLORATORY LAPAROTOMY, SMALL BOWEL RESECTION, AND EXCISIONAL BIOPSIES OF MULTIPLE OMENTAL MASSES;  Surgeon: Adalberto Acton, MD;  Location: [facility];  Service: General;  Laterality: N/A;   TUBAL LIGATION      I have reviewed the social history and family history with the patient and they are unchanged from previous note.  ALLERGIES:  has no known allergies.  MEDICATIONS:  Current Outpatient Medications  Medication Sig Dispense Refill   acetaminophen  (TYLENOL ) 500 MG tablet Take 2 tablets (1,000 mg total) by mouth every 8 (eight) hours as needed for mild pain or fever.     diclofenac  Sodium (VOLTAREN ) 1 % GEL Research Patient: Apply 0.5 grams (1 fingertip) to each hand and each foot twice daily for up to 12 weeks 400 g 0   Lacosamide  100 MG TABS Take 1 tablet (100 mg total) by mouth 2 (two) times daily. 60 tablet 3   levETIRAcetam  (KEPPRA ) 1000 MG tablet Take 1 tablet (1,000 mg total) by mouth 2 (two) times daily. 120 tablet 0   levETIRAcetam  (KEPPRA ) 1000 MG tablet Take 1 tablet (1,000 mg total) by mouth 2 (two) times daily. 60 tablet 0   LORazepam  (ATIVAN ) 1 MG tablet Take 1 tablet (1 mg total) by mouth every 6 (six) hours as  needed for anxiety. 30 tablet 0   methocarbamol  (ROBAXIN ) 500 MG tablet Take 2 tablets (1,000 mg total) by mouth every 8 (eight) hours as needed for muscle spasms. 30 tablet 0   nitroGLYCERIN  (NITROSTAT ) 0.4 MG SL tablet Place 1 tablet (0.4 mg total) under the tongue every 5 (five) minutes as needed for chest pain. 25 tablet 0   ondansetron  (ZOFRAN ) 8 MG tablet Take 1 tablet (8 mg total) by mouth every 8 (eight) hours as needed for nausea or vomiting. 30 tablet 2   oxyCODONE  (OXY IR/ROXICODONE ) 5 MG immediate release tablet Take 1-2 tablets (5-10 mg total) by mouth every 6 (six) hours as needed for moderate pain or severe pain (5mg  moderate,  10mg  severe). (Patient taking differently: Take 5 mg by mouth every 6 (six) hours as needed for moderate pain (pain score 4-6) or severe pain (pain score 7-10).) 60 tablet 0   polyethylene glycol powder (GLYCOLAX /MIRALAX ) 17 GM/SCOOP powder Take 17 g by mouth daily mixed in liquid as directed. 238 g 0   potassium chloride  SA (KLOR-CON  M) 20 MEQ tablet Take 1 tablet (20 mEq total) by mouth daily. Do not crush or break-up 30 tablet 0   simethicone  (MYLICON) 80 MG chewable tablet Chew 1 tablet (80 mg total) by mouth 4 (four) times daily as needed for flatulence. 100 tablet 0   valsartan  (DIOVAN ) 40 MG tablet Take 1 tablet (40 mg total) by mouth daily. 90 tablet 2   capecitabine  (XELODA ) 500 MG tablet TAKE  2 tabs in morning and 3 tabs in evening, every 10-12 hours. Take within 30 minutes after meal. Take for 7 days on, then 7 days off, repeat every 14 days. 70 tablet 1   No current facility-administered medications for this visit.   Facility-Administered Medications Ordered in Other Visits  Medication Dose Route Frequency Provider Last Rate Last Admin   dextrose  5 % solution   Intravenous Continuous Sonja [place], MD   Stopped at 02/11/24 1328   sodium chloride  flush (NS) 0.9 % injection 10 mL  10 mL Intracatheter PRN Sonja [place], MD   10 mL at 02/11/24 1325    PHYSICAL EXAMINATION: ECOG PERFORMANCE STATUS: 1 - Symptomatic but completely ambulatory  Vitals:   02/11/24 0956  BP: (!) 152/88  Pulse: 78  Resp: (!) 22  Temp: 98.2 F (36.8 C)  SpO2: 98%   Wt Readings from Last 3 Encounters:  02/11/24 203 lb 11.2 oz (92.4 kg)  01/28/24 201 lb 12.8 oz (91.5 kg)  01/14/24 202 lb 11.2 oz (91.9 kg)    GENERAL:alert, no distress and comfortable SKIN: skin color, texture, turgor are normal, no rashes or significant lesions EYES: normal, Conjunctiva are pink and non-injected, sclera clear NECK: supple, thyroid  normal size, non-tender, without nodularity LYMPH:  no palpable lymphadenopathy in the  cervical, axillary  LUNGS: clear to auscultation and percussion with normal breathing effort HEART: regular rate & rhythm and no murmurs and no lower extremity edema ABDOMEN:abdomen soft, non-tender and normal bowel sounds Musculoskeletal:no cyanosis of digits and no clubbing  NEURO: alert & oriented x 3 with fluent speech, no focal motor/sensory deficits  Physical Exam    LABORATORY DATA:  I have reviewed the data as listed    Latest Ref Rng & Units 02/11/2024    9:17 AM 01/28/2024    9:25 AM 01/14/2024    9:06 AM  CBC  WBC 4.0 - 10.5 K/uL 8.5  9.2  9.1   Hemoglobin 12.0 - 15.0 g/dL 13.6  14.1  13.7   Hematocrit 36.0 - 46.0 % 39.6  40.4  40.7   Platelets 150 - 400 K/uL 228  236  237         Latest Ref Rng & Units 02/11/2024    9:17 AM 01/28/2024    9:25 AM 01/14/2024    9:06 AM  CMP  Glucose 70 - 99 mg/dL 811  914  98   BUN 6 - 20 mg/dL 7  8  8    Creatinine 0.44 - 1.00 mg/dL 7.82  9.56  2.13   Sodium 135 - 145 mmol/L 137  139  138   Potassium 3.5 - 5.1 mmol/L 3.5  3.1  3.5   Chloride 98 - 111 mmol/L 104  103  105   CO2 22 - 32 mmol/L 27  27  27    Calcium  8.9 - 10.3 mg/dL 9.4  9.1  9.3   Total Protein 6.5 - 8.1 g/dL  7.9    Total Bilirubin 0.0 - 1.2 mg/dL  0.5    Alkaline Phos 38 - 126 U/L  90    AST 15 - 41 U/L  26    ALT 0 - 44 U/L  19        RADIOGRAPHIC STUDIES: I have personally reviewed the radiological images as listed and agreed with the findings in the report. No results found.    Orders Placed This Encounter  Procedures   CBC with Differential (Cancer Center Only)    Standing Status:   Future    Expected Date:   03/10/2024    Expiration Date:   03/10/2025   Basic Metabolic Panel - Cancer Center Only    Standing Status:   Future    Expected Date:   03/10/2024    Expiration Date:   03/10/2025   CBC with Differential (Cancer Center Only)    Standing Status:   Future    Expected Date:   03/24/2024    Expiration Date:   03/24/2025   Basic Metabolic Panel -  Cancer Center Only    Standing Status:   Future    Expected Date:   03/24/2024    Expiration Date:   03/24/2025   CBC with Differential (Cancer Center Only)    Standing Status:   Future    Expected Date:   04/07/2024    Expiration Date:   04/07/2025   Basic Metabolic Panel - Cancer Center Only    Standing Status:   Future    Expected Date:   04/07/2024    Expiration Date:   04/07/2025   CBC with Differential (Cancer Center Only)    Standing Status:   Future    Expected Date:   04/28/2024    Expiration Date:   04/28/2025   Basic Metabolic Panel - Cancer Center Only    Standing Status:   Future    Expected Date:   04/28/2024    Expiration Date:   04/28/2025   All questions were answered. The patient knows to call the clinic with any problems, questions or concerns. No barriers to learning was detected. The total time spent in the appointment was 30 minutes.     Sonja [place], MD 02/11/2024

## 2024-02-16 ENCOUNTER — Other Ambulatory Visit: Payer: Self-pay | Admitting: Pharmacy Technician

## 2024-02-16 ENCOUNTER — Other Ambulatory Visit: Payer: Self-pay

## 2024-02-16 NOTE — Progress Notes (Signed)
 Specialty Pharmacy Refill Coordination Note  Linda [name] is a 58 y.o. female contacted today regarding refills of specialty medication(s) Capecitabine  (XELODA )   Patient requested (Patient-Rptd) Delivery   Delivery date: (Patient-Rptd) 02/27/24   Verified address: (Patient-Rptd) [address] [place], [place] [address]   Medication will be filled on 02/26/24.

## 2024-02-25 ENCOUNTER — Inpatient Hospital Stay (HOSPITAL_BASED_OUTPATIENT_CLINIC_OR_DEPARTMENT_OTHER): Admitting: Hematology

## 2024-02-25 ENCOUNTER — Inpatient Hospital Stay: Attending: Hematology

## 2024-02-25 ENCOUNTER — Encounter: Payer: Self-pay | Admitting: Hematology

## 2024-02-25 ENCOUNTER — Inpatient Hospital Stay

## 2024-02-25 VITALS — BP 164/92 | HR 86 | Temp 97.4°F | Resp 20 | Ht 64.0 in | Wt 202.5 lb

## 2024-02-25 DIAGNOSIS — C787 Secondary malignant neoplasm of liver and intrahepatic bile duct: Secondary | ICD-10-CM | POA: Insufficient documentation

## 2024-02-25 DIAGNOSIS — Z5111 Encounter for antineoplastic chemotherapy: Secondary | ICD-10-CM | POA: Diagnosis present

## 2024-02-25 DIAGNOSIS — C786 Secondary malignant neoplasm of retroperitoneum and peritoneum: Secondary | ICD-10-CM | POA: Diagnosis not present

## 2024-02-25 DIAGNOSIS — C172 Malignant neoplasm of ileum: Secondary | ICD-10-CM | POA: Insufficient documentation

## 2024-02-25 DIAGNOSIS — Z95828 Presence of other vascular implants and grafts: Secondary | ICD-10-CM

## 2024-02-25 DIAGNOSIS — Z79899 Other long term (current) drug therapy: Secondary | ICD-10-CM | POA: Insufficient documentation

## 2024-02-25 LAB — BASIC METABOLIC PANEL - CANCER CENTER ONLY
Anion gap: 10 (ref 5–15)
BUN: 8 mg/dL (ref 6–20)
CO2: 26 mmol/L (ref 22–32)
Calcium: 9.2 mg/dL (ref 8.9–10.3)
Chloride: 100 mmol/L (ref 98–111)
Creatinine: 0.79 mg/dL (ref 0.44–1.00)
GFR, Estimated: >60 mL/min (ref >60)
Glucose, Bld: 157 mg/dL — ABNORMAL HIGH (ref 70–99)
Potassium: 3.3 mmol/L — ABNORMAL LOW (ref 3.5–5.1)
Sodium: 136 mmol/L (ref 135–145)

## 2024-02-25 LAB — HEPATIC FUNCTION PANEL
ALT: 19 U/L (ref 0–44)
AST: 32 U/L (ref 15–41)
Albumin: 3.7 g/dL (ref 3.5–5.0)
Alkaline Phosphatase: 94 U/L (ref 38–126)
Bilirubin, Direct: <0.1 mg/dL (ref 0.0–0.2)
Total Bilirubin: 0.8 mg/dL (ref 0.0–1.2)
Total Protein: 8.3 g/dL — ABNORMAL HIGH (ref 6.5–8.1)

## 2024-02-25 LAB — PREGNANCY, URINE: Preg Test, Ur: NEGATIVE

## 2024-02-25 LAB — CBC WITH DIFFERENTIAL (CANCER CENTER ONLY)
Abs Immature Granulocytes: 0.04 10*3/uL (ref 0.00–0.07)
Basophils Absolute: 0 10*3/uL (ref 0.0–0.1)
Basophils Relative: 0 %
Eosinophils Absolute: 0.1 10*3/uL (ref 0.0–0.5)
Eosinophils Relative: 2 %
HCT: 41.1 % (ref 36.0–46.0)
Hemoglobin: 14.2 g/dL (ref 12.0–15.0)
Immature Granulocytes: 1 %
Lymphocytes Relative: 27 %
Lymphs Abs: 2.2 10*3/uL (ref 0.7–4.0)
MCH: 32 pg (ref 26.0–34.0)
MCHC: 34.5 g/dL (ref 30.0–36.0)
MCV: 92.6 fL (ref 80.0–100.0)
Monocytes Absolute: 0.8 10*3/uL (ref 0.1–1.0)
Monocytes Relative: 10 %
Neutro Abs: 5 10*3/uL (ref 1.7–7.7)
Neutrophils Relative %: 60 %
Platelet Count: 207 10*3/uL (ref 150–400)
RBC: 4.44 MIL/uL (ref 3.87–5.11)
RDW: 14.9 % (ref 11.5–15.5)
WBC Count: 8.2 10*3/uL (ref 4.0–10.5)
nRBC: 0 % (ref 0.0–0.2)

## 2024-02-25 LAB — CEA (ACCESS): CEA (CHCC): 63.15 ng/mL — ABNORMAL HIGH (ref 0.00–5.00)

## 2024-02-25 MED ORDER — SODIUM CHLORIDE 0.9% FLUSH
10.0000 mL | Freq: Once | INTRAVENOUS | Status: AC
  Administered 2024-02-25: 10 mL

## 2024-02-25 MED ORDER — DEXTROSE 5 % IV SOLN: INTRAVENOUS | Status: DC

## 2024-02-25 MED ORDER — PALONOSETRON HCL INJECTION 0.25 MG/5ML
0.2500 mg | Freq: Once | INTRAVENOUS | Status: AC
  Administered 2024-02-25: 0.25 mg via INTRAVENOUS
  Filled 2024-02-25: qty 5

## 2024-02-25 MED ORDER — HEPARIN SOD (PORK) LOCK FLUSH 100 UNIT/ML IV SOLN
500.0000 [IU] | Freq: Once | INTRAVENOUS | Status: AC | PRN
  Administered 2024-02-25: 500 [IU]

## 2024-02-25 MED ORDER — OXALIPLATIN CHEMO INJECTION 100 MG/20ML
50.0000 mg/m2 | Freq: Once | INTRAVENOUS | Status: AC
  Administered 2024-02-25: 100 mg via INTRAVENOUS
  Filled 2024-02-25: qty 3.86

## 2024-02-25 MED ORDER — DEXAMETHASONE SODIUM PHOSPHATE 10 MG/ML IJ SOLN
10.0000 mg | Freq: Once | INTRAMUSCULAR | Status: AC
  Administered 2024-02-25: 10 mg via INTRAVENOUS
  Filled 2024-02-25: qty 1

## 2024-02-25 MED ORDER — SODIUM CHLORIDE 0.9% FLUSH
10.0000 mL | INTRAVENOUS | Status: DC | PRN
Start: 2024-02-25 — End: 2024-02-25
  Administered 2024-02-25: 10 mL

## 2024-02-25 NOTE — Assessment & Plan Note (Signed)
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  [name] in the liver MRI also showed a 1.8 cm oligo met in segment 7 of liver. -We discussed the incurable nature of her metastatic disease, due to her metastasis in peritoneum and liver.  We also discussed the small possibility of HIPEC and liver directed therapy if she has excellent response to chemotherapy. -I recommend first-line chemotherapy FOLFOX.  She started on July 31, 2023, oxaliplatin  was held for first cycle due to open wound. -She developed seizure activity after first cycle of 5-FU infusion. She was evaluated by new oncologist Dr. Mark Sil, her seizure was felt to be unlikely related to 5-FU. She felt that seizure was coming after cycle 2 chemo when she coming in for pump d/c and required ativan . I changed her cehmo to CAPOX on 08/28/2023. She required dose reduction  -restaging CT 11/11/2023 showed mixed response with improved liver met and slightly worse peritoneal mets, I slightly increased her xeloda  dose  -restaging CT 02/04/2024 showed stable disease

## 2024-02-25 NOTE — Patient Instructions (Signed)
 [facility] - A DEPT OF [facility]  Discharge Instructions: Thank you for choosing [facility] to provide your oncology and hematology care.   If you have a lab appointment with the Cancer Center, please go directly to the Cancer Center and check in at the registration area.   Wear comfortable clothing and clothing appropriate for easy access to any Portacath or PICC line.   We strive to give you quality time with your provider. You may need to reschedule your appointment if you arrive late (15 or more minutes).  Arriving late affects you and other patients whose appointments are after yours.  Also, if you miss three or more appointments without notifying the office, you may be dismissed from the clinic at the provider's discretion.      For prescription refill requests, have your pharmacy contact our office and allow 72 hours for refills to be completed.    Today you received the following chemotherapy and/or immunotherapy agents: Oxaliplatin      To help prevent nausea and vomiting after your treatment, we encourage you to take your nausea medication as directed.  BELOW ARE SYMPTOMS THAT SHOULD BE REPORTED IMMEDIATELY: *FEVER GREATER THAN 100.4 F (38 C) OR HIGHER *CHILLS OR SWEATING *NAUSEA AND VOMITING THAT IS NOT CONTROLLED WITH YOUR NAUSEA MEDICATION *UNUSUAL SHORTNESS OF BREATH *UNUSUAL BRUISING OR BLEEDING *URINARY PROBLEMS (pain or burning when urinating, or frequent urination) *BOWEL PROBLEMS (unusual diarrhea, constipation, pain near the anus) TENDERNESS IN MOUTH AND THROAT WITH OR WITHOUT PRESENCE OF ULCERS (sore throat, sores in mouth, or a toothache) UNUSUAL RASH, SWELLING OR PAIN  UNUSUAL VAGINAL DISCHARGE OR ITCHING   Items with * indicate a potential emergency and should be followed up as soon as possible or go to the Emergency Department if any problems should occur.  Please show the CHEMOTHERAPY ALERT CARD or  IMMUNOTHERAPY ALERT CARD at check-in to the Emergency Department and triage nurse.  Should you have questions after your visit or need to cancel or reschedule your appointment, please contact [facility] - [facility]  Dept: [phone]  and follow the prompts.  Office hours are 8:00 a.m. to 4:30 p.m. Monday - Friday. Please note that voicemails left after 4:00 p.m. may not be returned until the following business day.  We are closed weekends and major holidays. You have access to a nurse at all times for urgent questions. Please call the main number to the clinic Dept: [phone] and follow the prompts.   For any non-urgent questions, you may also contact your provider using MyChart. We now offer e-Visits for anyone 36 and older to request care online for non-urgent symptoms. For details visit [email or web address].   Also download the MyChart app! Go to the app store, search "MyChart", open the app, select [place], and log in with your MyChart username and password.

## 2024-02-25 NOTE — Progress Notes (Signed)
 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  [name] in the liver MRI also showed a 1.8 cm oligo met in segment 7 of liver. -We discussed the incurable nature of her metastatic disease, due to her metastasis in peritoneum and liver.  We also discussed the small possibility of HIPEC and liver directed therapy if she has excellent response to chemotherapy. -I recommend first-line chemotherapy FOLFOX.  She started on July 31, 2023, oxaliplatin  was held for first cycle due to open wound. -She developed seizure activity after first cycle of 5-FU infusion. She was evaluated by new oncologist Dr. Mark Sil, her seizure was felt to be unlikely related to 5-FU. She felt that seizure was coming after cycle 2 chemo when she coming in for pump d/c and required ativan . I changed her cehmo to CAPOX on 08/28/2023. She required dose reduction  -restaging CT 11/11/2023 showed mixed response with improved liver met and slightly worse peritoneal mets, I slightly increased her xeloda  dose  -restaging CT 02/04/2024 showed stable disease   Assessment & Plan Small bowel cancer Small bowel cancer under chemotherapy with Xeloda  and oxaliplatin . She experiences fatigue for 3-4 days post-treatment, maintains  weight, and reports no nausea or diarrhea. Cold sensitivity persists post-infusion. Neuropathy, a dose-limiting side effect, is common after 6 months of treatment. The decision was made to reduce the oxaliplatin  dose to manage neuropathy. - Reduce oxaliplatin  dose by 30-35% - Change oxaliplatin  infusion schedule to every 4 weeks - Continue Xeloda  as maintenance therapy - Cancel infusion scheduled for May 21 - Schedule next infusion for June 3 - Perform CT scan in July - Consider stopping oxaliplatin  if neuropathy persists and low dose is effective - Repeat scans every 3 months if maintenance therapy is effective - Advise use of ice packs during oxaliplatin  infusion to reduce neuropathy risk - Encourage exercise to improve circulation  Chemotherapy-induced peripheral neuropathy Peripheral neuropathy with tingling in toes, worsening over the past 1.5-2 months. No balance issues reported. Neuropathy is a dose-limiting side effect of oxaliplatin . Safety precautions were discussed to prevent falls and injuries. - Advise taking B12 or B complex supplements for nerve nutrition - Provide safety advice to prevent falls and injuries due to neuropathy  Plan - Lab reviewed, adequate for treatment.  Due to her moderate peripheral neuropathy from oxaliplatin , I will reduce dose to 50 mg/m2, and change to every 4 weeks if neuropathy not getting worse. - Continue Xeloda  1000 mg in the morning, 1500 mg in the evening, 1 week on and 1 week off - Follow-up in 4 weeks, will order restaging scan on next visit   SUMMARY OF ONCOLOGIC HISTORY: Oncology History  Cancer of ileum with carcinomatosis  07/10/2023 Initial Diagnosis   Cancer of ileum with carcinomatosis   07/22/2023 Cancer Staging   Staging form: Small Intestine - Adenocarcinoma, AJCC 8th Edition -  Pathologic: Stage IV (pT4, pNX, pM1) - Signed by Sonja [place], MD on 07/22/2023 Histologic grade (G): G2 Histologic grading system: 4 grade system    07/31/2023 - 08/16/2023 Chemotherapy   Patient is on Treatment Plan : COLORECTAL FOLFOX q14d     08/28/2023 -  Chemotherapy   Patient is on Treatment Plan : COLORECTAL CAPEOX (130/850) q21d x 8 cycles        Discussed the use of AI scribe software for clinical note transcription with the patient, who gave verbal consent to proceed.  History of Present Illness Linda [name] is a 58 year old female with small bowel cancer who presents for follow-up.  She experiences fatigue following chemotherapy treatments, lasting three to four days, with increased need for sleep. Her energy levels are good when not on chemotherapy. Weight remains stable at 202 pounds with adequate nutrition and hydration. No nausea, diarrhea, or skin problems are present.  She has tingling in her toes for the past one and a half to two months, worsening over time. The sensation is needle-like and confined to her toes, with no similar symptoms in her fingers. No balance issues or walking difficulties are reported.  Cold sensitivity occurs after treatments and does not fully resolve. She engages in minimal physical activity, primarily walking around the house.  Her current medications include chemotherapy pills taken regularly, with no recent need for nausea medication. She occasionally takes Tylenol  and is not on pain, blood pressure, or seizure medications.     All other systems were reviewed with the patient and are negative.  MEDICAL HISTORY:  Past Medical History:  Diagnosis Date   Abnormal uterine bleeding 04/10/2022   Anemia    Hypertension    Pre-diabetes 02/2022    SURGICAL HISTORY: Past Surgical History:  Procedure Laterality Date   BREAST LUMPECTOMY WITH RADIOACTIVE SEED LOCALIZATION Left 06/10/2022   Procedure: LEFT BREAST LUMPECTOMY WITH RADIOACTIVE SEED LOCALIZATION;  Surgeon: Caralyn Chandler, MD;  Location: [facility];  Service: General;  Laterality: Left;   CESAREAN SECTION      x4   IR IMAGING GUIDED PORT INSERTION  07/08/2023   LAPAROTOMY N/A 06/30/2023   Procedure: EXPLORATORY LAPAROTOMY, SMALL BOWEL RESECTION, AND EXCISIONAL BIOPSIES OF MULTIPLE OMENTAL MASSES;  Surgeon: Adalberto Acton, MD;  Location: [facility];  Service: General;  Laterality: N/A;   TUBAL LIGATION      I have reviewed the social history and family history with the patient and they are unchanged from previous note.  ALLERGIES:  has no known allergies.  MEDICATIONS:  Current Outpatient Medications  Medication Sig Dispense Refill   acetaminophen  (TYLENOL ) 500 MG tablet Take 2 tablets (1,000 mg total) by mouth every 8 (eight) hours as needed for mild pain or fever.     capecitabine  (XELODA ) 500 MG tablet TAKE  2 tabs in morning and 3 tabs in evening, every 10-12 hours. Take within 30 minutes after meal. Take for 7 days on, then 7 days off, repeat every 14 days. 70 tablet 1   diclofenac  Sodium (VOLTAREN ) 1 % GEL Research Patient: Apply 0.5 grams (1 fingertip) to each hand and each foot twice daily for up to 12 weeks 400 g 0   Lacosamide  100 MG TABS Take 1 tablet (100 mg total) by mouth 2 (two) times daily. 60 tablet 3   levETIRAcetam  (KEPPRA ) 1000 MG tablet Take 1 tablet (1,000 mg total) by mouth 2 (two) times daily. 120 tablet 0   levETIRAcetam  (KEPPRA ) 1000 MG  tablet Take 1 tablet (1,000 mg total) by mouth 2 (two) times daily. 60 tablet 0   LORazepam  (ATIVAN ) 1 MG tablet Take 1 tablet (1 mg total) by mouth every 6 (six) hours as needed for anxiety. 30 tablet 0   methocarbamol  (ROBAXIN ) 500 MG tablet Take 2 tablets (1,000 mg total) by mouth every 8 (eight) hours as needed for muscle spasms. 30 tablet 0   nitroGLYCERIN  (NITROSTAT ) 0.4 MG SL tablet Place 1 tablet (0.4 mg total) under the tongue every 5 (five) minutes as needed for chest pain. 25 tablet 0   ondansetron  (ZOFRAN ) 8 MG tablet Take 1 tablet (8 mg total) by mouth every 8 (eight) hours as needed for nausea or vomiting. 30 tablet 2    polyethylene glycol powder (GLYCOLAX /MIRALAX ) 17 GM/SCOOP powder Take 17 g by mouth daily mixed in liquid as directed. 238 g 0   potassium chloride  SA (KLOR-CON  M) 20 MEQ tablet Take 1 tablet (20 mEq total) by mouth daily. Do not crush or break-up 30 tablet 0   simethicone  (MYLICON) 80 MG chewable tablet Chew 1 tablet (80 mg total) by mouth 4 (four) times daily as needed for flatulence. 100 tablet 0   valsartan  (DIOVAN ) 40 MG tablet Take 1 tablet (40 mg total) by mouth daily. 90 tablet 2   No current facility-administered medications for this visit.    PHYSICAL EXAMINATION: ECOG PERFORMANCE STATUS: 1 - Symptomatic but completely ambulatory  Vitals:   02/25/24 1030  BP: (!) 164/92  Pulse: 86  Resp: 20  Temp: (!) 97.4 F (36.3 C)  SpO2: 97%   Wt Readings from Last 3 Encounters:  02/25/24 202 lb 8 oz (91.9 kg)  02/11/24 203 lb 11.2 oz (92.4 kg)  01/28/24 201 lb 12.8 oz (91.5 kg)     GENERAL:alert, no distress and comfortable SKIN: skin color, texture, turgor are normal, no rashes or significant lesions EYES: normal, Conjunctiva are pink and non-injected, sclera clear NECK: supple, thyroid  normal size, non-tender, without nodularity LYMPH:  no palpable lymphadenopathy in the cervical, axillary  LUNGS: clear to auscultation and percussion with normal breathing effort HEART: regular rate & rhythm and no murmurs and no lower extremity edema ABDOMEN:abdomen soft, non-tender and normal bowel sounds Musculoskeletal:no cyanosis of digits and no clubbing  NEURO: alert & oriented x 3 with fluent speech, no focal motor/sensory deficits, except decreased sensation in hands  Physical Exam   LABORATORY DATA:  I have reviewed the data as listed    Latest Ref Rng & Units 02/25/2024   10:16 AM 02/11/2024    9:17 AM 01/28/2024    9:25 AM  CBC  WBC 4.0 - 10.5 K/uL 8.2  8.5  9.2   Hemoglobin 12.0 - 15.0 g/dL 95.6  21.3  08.6   Hematocrit 36.0 - 46.0 % 41.1  39.6  40.4   Platelets 150 - 400  K/uL 207  228  236         Latest Ref Rng & Units 02/25/2024   10:16 AM 02/11/2024    9:17 AM 01/28/2024    9:25 AM  CMP  Glucose 70 - 99 mg/dL 578  469  629   BUN 6 - 20 mg/dL 8  7  8    Creatinine 0.44 - 1.00 mg/dL 5.28  4.13  2.44   Sodium 135 - 145 mmol/L 136  137  139   Potassium 3.5 - 5.1 mmol/L 3.3  3.5  3.1   Chloride 98 - 111 mmol/L 100  104  103   CO2 22 - 32 mmol/L 26  27  27    Calcium  8.9 - 10.3 mg/dL 9.2  9.4  9.1   Total Protein 6.5 - 8.1 g/dL   7.9   Total Bilirubin 0.0 - 1.2 mg/dL   0.5   Alkaline Phos 38 - 126 U/L   90   AST 15 - 41 U/L   26   ALT 0 - 44 U/L   19       RADIOGRAPHIC STUDIES: I have personally reviewed the radiological images as listed and agreed with the findings in the report. No results found.    No orders of the defined types were placed in this encounter.  All questions were answered. The patient knows to call the clinic with any problems, questions or concerns. No barriers to learning was detected. The total time spent in the appointment was 30 minutes.     Sonja [place], MD 02/25/2024

## 2024-02-26 ENCOUNTER — Other Ambulatory Visit: Payer: Self-pay

## 2024-03-10 ENCOUNTER — Ambulatory Visit

## 2024-03-10 ENCOUNTER — Ambulatory Visit: Admitting: Hematology

## 2024-03-10 ENCOUNTER — Other Ambulatory Visit

## 2024-03-16 ENCOUNTER — Other Ambulatory Visit (HOSPITAL_COMMUNITY): Payer: Self-pay

## 2024-03-16 NOTE — Progress Notes (Signed)
 Specialty Pharmacy Ongoing Clinical Assessment Note  Linda [name] is a 58 y.o. female who is being followed by the specialty pharmacy service for RxSp Oncology   Patient's specialty medication(s) reviewed today: Capecitabine  (XELODA )   Missed doses in the last 4 weeks: 0   Patient/Caregiver did not have any additional questions or concerns.   Therapeutic benefit summary: Patient is achieving benefit   Adverse events/side effects summary: Experienced adverse events/side effects (tingling in hands and feet, has improved, pt is using Voltaren  gel which has helped)   Patient's therapy is appropriate to: Continue    Goals Addressed             This Visit's Progress    Slow Disease Progression   On track    Patient is on track. Patient will maintain adherence. Per Dr. Candise Chambers office notes from 02/25/24 report that the most recent CT from 02/04/24 showed stable disease.          Follow up: 6 months  Malachi Screws Specialty Pharmacist

## 2024-03-16 NOTE — Progress Notes (Signed)
 Specialty Pharmacy Refill Coordination Note  Linda [name] is a 58 y.o. female contacted today regarding refills of specialty medication(s) Capecitabine  (XELODA )   Patient requested Delivery   Delivery date: 03/25/24   Verified address: [address] [place], [place] [address]   Medication will be filled on 03/24/24.

## 2024-03-23 ENCOUNTER — Other Ambulatory Visit: Payer: Self-pay | Admitting: Nurse Practitioner

## 2024-03-23 ENCOUNTER — Other Ambulatory Visit: Payer: Self-pay

## 2024-03-23 DIAGNOSIS — C172 Malignant neoplasm of ileum: Secondary | ICD-10-CM

## 2024-03-23 NOTE — Progress Notes (Unsigned)
 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  [name] in the liver MRI also showed a 1.8 cm oligo met in segment 7 of liver. -We discussed the incurable nature of her metastatic disease, due to her metastasis in peritoneum and liver.  We also discussed the small possibility of HIPEC and liver directed therapy if she has excellent response to chemotherapy. -First-line chemotherapy FOLFOX was recommended.  She started on July 31, 2023, oxaliplatin  was held for first cycle due to open wound. -She developed seizure activity after first cycle of 5-FU infusion. She was evaluated by new oncologist Dr. Mark Sil, her seizure was felt to be unlikely related to 5-FU. She felt that seizure was coming after cycle 2 chemo when she coming in for pump d/c and required ativan . Thus, her chemo was changed to CAPOX on 08/28/2023. She required dose reduction  -restaging CT 11/11/2023 showed mixed response with improved liver met and slightly worse peritoneal mets, I slightly increased her xeloda  dose  -restaging CT 02/04/2024 showed stable disease  - 02/25/2024 -oxaliplatin  dose reduced to 50 mg/m.  It was also changed to every 4 weeks due to worsening peripheral neuropathy.  New restaging scan due in July 2025. This was ordered during today's visit .   Constipation Patient has intermittent constipation.  Accompanied by nausea and  vomiting.  Can be so significant it decreases her activity levels.  Also causes abdominal discomfort and decreased appetite.  She takes MiraLAX  periodically.  Only takes it when she feels herself already being constipated.  Discussed need to take MiraLAX  6 every day.  Recommended dose of 17 g daily mixed in full glass of water.  If diarrhea develops, she can cut dosing in half, but continued to take to prevent constipation.  Peripheral neuropathy Dose oxaliplatin  changed on 02/25/2019 25 to 50 mg/m every 4 weeks.  Patient is tolerating this dosing regimen better.  Using Voltaren  gel on her hands to help with neuropathy. Reports some improvement with the changes.   Plan: Labs reviewed.  -CBC normal, BMP and hepatic function essentially normal.  Encouraged daily use of MiraLAX  at 17 g mixed in 8 ounces of water or orange juice to prevent constipation. Labs and patient presentation are satisfactory for oxaliplatin , cycle 12 day 1.  Continue with reduced dose oxaliplatin  at 50 mg/m. CT CAP ordered today.  Plan to complete prior to next visit with Dr. Maryalice Smaller. Labs/flush, follow up, and treatment on 04/28/2024. Will cancel labs/flush, follow up, and treatment on 04/07/2024 and continue with q4 weeks oxaliplatin .   The patient understands the plans discussed today and is in agreement with them.  She knows to contact our office if she develops concerns prior to her next appointment.  I provided 25 minutes of face-to-face time during this encounter and > 50% was spent counseling as documented under my assessment and plan.    Sharyon Deis, NP  [facility] - A DEPT OF  [facility] Katheryn Pandy [address] [place] [address] Dept: [phone] Dept Fax: [phone]   Orders Placed This Encounter  Procedures   CT CHEST ABDOMEN PELVIS W CONTRAST    Standing Status:   Future    Expected Date:   04/21/2024    Expiration Date:   03/24/2025    If  indicated for the ordered procedure, I authorize the administration of contrast media per Radiology protocol:   Yes    Does the patient have a contrast media/X-ray dye allergy?:   No    Preferred imaging location?:   [facility]    If indicated for the ordered procedure, I authorize the administration of oral contrast media per Radiology protocol:   Yes      CHIEF COMPLAINT:  CC: Metastatic small bowel cancer  Current Treatment: CapeOx  INTERVAL HISTORY:  Linda [name] is here today for repeat clinical assessment.  She was last seen by Dr. Maryalice Smaller on 02/25/2024.  Oxaliplatin  was dose reduced to 50 mg/m and changed to every 4 weeks.  Changes made due to worsening neuropathy.  She states that dose reduction and addition on voltaren  gel have been helpful in treating her neuropathy. It is still present, but has not become worse. Consider stopping oxaliplatin  completely if neuropathy is persistent.  Most recent restaging CT CAP done 02/04/2024 showed stable disease.  Order placed for new CT CAP to be done prior to next appointment for labs/flush, follow up, and treatment.  She does report intermittent constipation which causes her to have abdominal tenderness, decreased appetite, nausea with vomiting, and reduced activity. She denies chest pain, chest pressure, or shortness of breath. She denies headaches or visual disturbances. She denies fevers or chills. She denies pain. Her appetite is good. Her weight has been stable.  I have reviewed the past medical history, past surgical history, social history and family history with the patient and they are unchanged from previous note.  ALLERGIES:  has no known allergies.  MEDICATIONS:  Current Outpatient Medications  Medication Sig Dispense Refill   acetaminophen  (TYLENOL ) 500 MG tablet Take 2 tablets (1,000 mg total) by mouth every 8 (eight) hours as needed for mild pain or fever.     capecitabine  (XELODA ) 500 MG tablet TAKE  2 tabs in morning and 3 tabs  in evening, every 10-12 hours. Take within 30 minutes after meal. Take for 7 days on, then 7 days off, repeat every 14 days. 70 tablet 1   diclofenac  Sodium (VOLTAREN ) 1 % GEL Research Patient: Apply 0.5 grams (1 fingertip) to each hand and each foot twice daily for up to 12 weeks 400 g 0   Lacosamide  100 MG TABS Take 1 tablet (100 mg total) by mouth 2 (two) times daily. 60 tablet 3   levETIRAcetam  (KEPPRA ) 1000 MG tablet Take 1 tablet (1,000 mg total) by mouth 2 (two) times daily. 120 tablet 0   levETIRAcetam  (KEPPRA ) 1000 MG tablet Take 1 tablet (1,000 mg total) by mouth 2 (two) times daily. 60 tablet 0   LORazepam  (ATIVAN ) 1 MG tablet Take 1 tablet (1 mg total) by mouth every 6 (six) hours as needed for anxiety. 30 tablet 0   methocarbamol  (ROBAXIN ) 500 MG tablet Take 2 tablets (1,000 mg total) by mouth every 8 (eight) hours as needed for muscle spasms. 30 tablet 0   nitroGLYCERIN  (NITROSTAT ) 0.4 MG SL tablet Place 1 tablet (0.4 mg total) under the tongue every 5 (five) minutes as needed for chest pain.  25 tablet 0   ondansetron  (ZOFRAN ) 8 MG tablet Take 1 tablet (8 mg total) by mouth every 8 (eight) hours as needed for nausea or vomiting. 30 tablet 2   polyethylene glycol powder (GLYCOLAX /MIRALAX ) 17 GM/SCOOP powder Take 17 g by mouth daily mixed in liquid as directed. 238 g 0   potassium chloride  SA (KLOR-CON  M) 20 MEQ tablet Take 1 tablet (20 mEq total) by mouth daily. Do not crush or break-up 30 tablet 0   simethicone  (MYLICON) 80 MG chewable tablet Chew 1 tablet (80 mg total) by mouth 4 (four) times daily as needed for flatulence. 100 tablet 0   valsartan  (DIOVAN ) 40 MG tablet Take 1 tablet (40 mg total) by mouth daily. 90 tablet 2   No current facility-administered medications for this visit.   Facility-Administered Medications Ordered in Other Visits  Medication Dose Route Frequency Provider Last Rate Last Admin   dextrose  5 % solution   Intravenous Continuous Sonja Custer, MD   Stopped at  03/24/24 1234    HISTORY OF PRESENT ILLNESS:   Oncology History  Cancer of ileum with carcinomatosis  07/10/2023 Initial Diagnosis   Cancer of ileum with carcinomatosis   07/22/2023 Cancer Staging   Staging form: Small Intestine - Adenocarcinoma, AJCC 8th Edition - Pathologic: Stage IV (pT4, pNX, pM1) - Signed by Sonja Lehigh, MD on 07/22/2023 Histologic grade (G): G2 Histologic grading system: 4 grade system   07/31/2023 - 08/16/2023 Chemotherapy   Patient is on Treatment Plan : COLORECTAL FOLFOX q14d     08/28/2023 -  Chemotherapy   Patient is on Treatment Plan : COLORECTAL CAPEOX (130/850) q21d x 8 cycles         REVIEW OF SYSTEMS:   Constitutional: Denies fevers, chills or abnormal weight loss. Intermittent fatigue.  Eyes: Denies blurriness of vision Ears, nose, mouth, throat, and face: Denies mucositis or sore throat Respiratory: Denies cough, dyspnea or wheezes Cardiovascular: Denies palpitation, chest discomfort or lower extremity swelling Gastrointestinal:  intermittent constipation with nausea and vomiting and reduced appetite.  Skin: Denies abnormal skin rashes Lymphatics: Denies new lymphadenopathy or easy bruising Neurological:Denies numbness, tingling or new weaknesses Behavioral/Psych: Mood is stable, no new changes  All other systems were reviewed with the patient and are negative.   VITALS:   Today's Vitals   03/24/24 0827 03/24/24 0948  BP: (!) 148/100   Pulse: 65   Resp: 17   Temp: 97.9 F (36.6 C)   SpO2: 96%   Weight: 201 lb (91.2 kg)   PainSc:  0-No pain   Body mass index is 34.5 kg/m.   Wt Readings from Last 3 Encounters:  03/24/24 201 lb (91.2 kg)  02/25/24 202 lb 8 oz (91.9 kg)  02/11/24 203 lb 11.2 oz (92.4 kg)    Body mass index is 34.5 kg/m.  Performance status (ECOG): 1 - Symptomatic but completely ambulatory  PHYSICAL EXAM:   GENERAL:alert, no distress and comfortable SKIN: skin color, texture, turgor are normal, no rashes or  significant lesions EYES: normal, Conjunctiva are pink and non-injected, sclera clear OROPHARYNX:no exudate, no erythema and lips, buccal mucosa, and tongue normal  NECK: supple, thyroid  normal size, non-tender, without nodularity LYMPH:  no palpable lymphadenopathy in the cervical, axillary or inguinal LUNGS: clear to auscultation and percussion with normal breathing effort HEART: regular rate & rhythm and no murmurs and no lower extremity edema ABDOMEN:abdomen soft, non-tender and normal bowel sounds Musculoskeletal:no cyanosis of digits and no clubbing  NEURO: alert & oriented x 3  with fluent speech, no focal motor/sensory deficits  LABORATORY DATA:  I have reviewed the data as listed    Component Value Date/Time   NA 138 03/24/2024 0807   NA 141 05/16/2022 1044   K 3.6 03/24/2024 0807   CL 103 03/24/2024 0807   CO2 28 03/24/2024 0807   GLUCOSE 104 (H) 03/24/2024 0807   BUN 7 03/24/2024 0807   BUN 9 05/16/2022 1044   CREATININE 0.76 03/24/2024 0807   CALCIUM  9.4 03/24/2024 0807   PROT 8.3 (H) 03/24/2024 0807   PROT 7.8 05/16/2022 1044   ALBUMIN  3.6 03/24/2024 0807   ALBUMIN  3.5 (L) 08/03/2023 1515   AST 23 03/24/2024 0807   AST 15 08/14/2023 0829   ALT 15 03/24/2024 0807   ALT 13 08/14/2023 0829   ALKPHOS 102 03/24/2024 0807   BILITOT 0.9 03/24/2024 0807   BILITOT 0.3 08/14/2023 0829   GFRNONAA >60 03/24/2024 0807     Lab Results  Component Value Date   WBC 8.3 03/24/2024   NEUTROABS 5.2 03/24/2024   HGB 14.1 03/24/2024   HCT 41.7 03/24/2024   MCV 93.3 03/24/2024   PLT 269 03/24/2024

## 2024-03-23 NOTE — Assessment & Plan Note (Addendum)
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  [name] in the liver MRI also showed a 1.8 cm oligo met in segment 7 of liver. -We discussed the incurable nature of her metastatic disease, due to her metastasis in peritoneum and liver.  We also discussed the small possibility of HIPEC and liver directed therapy if she has excellent response to chemotherapy. -First-line chemotherapy FOLFOX was recommended.  She started on July 31, 2023, oxaliplatin  was held for first cycle due to open wound. -She developed seizure activity after first cycle of 5-FU infusion. She was evaluated by new oncologist Dr. Mark Sil, her seizure was felt to be unlikely related to 5-FU. She felt that seizure was coming after cycle 2 chemo when she coming in for pump d/c and required ativan . Thus, her chemo was changed to CAPOX on 08/28/2023. She required dose reduction  -restaging CT 11/11/2023 showed mixed response with improved liver met and slightly worse peritoneal mets, I slightly increased her xeloda  dose  -restaging CT 02/04/2024 showed stable disease  - 02/25/2024 -oxaliplatin  dose reduced to 50 mg/m.  It was also changed to every 4 weeks due to worsening peripheral neuropathy.  New restaging scan due in July 2025.

## 2024-03-24 ENCOUNTER — Inpatient Hospital Stay: Attending: Hematology | Admitting: Nurse Practitioner

## 2024-03-24 ENCOUNTER — Other Ambulatory Visit: Payer: Self-pay | Admitting: Hematology

## 2024-03-24 ENCOUNTER — Inpatient Hospital Stay

## 2024-03-24 ENCOUNTER — Other Ambulatory Visit: Payer: Self-pay

## 2024-03-24 ENCOUNTER — Other Ambulatory Visit

## 2024-03-24 ENCOUNTER — Encounter: Payer: Self-pay | Admitting: Nurse Practitioner

## 2024-03-24 ENCOUNTER — Inpatient Hospital Stay: Attending: Hematology

## 2024-03-24 VITALS — BP 147/91 | HR 60 | Temp 97.8°F | Resp 17

## 2024-03-24 VITALS — BP 148/100 | HR 65 | Temp 97.9°F | Resp 17 | Wt 201.0 lb

## 2024-03-24 DIAGNOSIS — C172 Malignant neoplasm of ileum: Secondary | ICD-10-CM

## 2024-03-24 DIAGNOSIS — C786 Secondary malignant neoplasm of retroperitoneum and peritoneum: Secondary | ICD-10-CM | POA: Insufficient documentation

## 2024-03-24 DIAGNOSIS — Z79899 Other long term (current) drug therapy: Secondary | ICD-10-CM | POA: Diagnosis not present

## 2024-03-24 DIAGNOSIS — C787 Secondary malignant neoplasm of liver and intrahepatic bile duct: Secondary | ICD-10-CM | POA: Insufficient documentation

## 2024-03-24 DIAGNOSIS — Z95828 Presence of other vascular implants and grafts: Secondary | ICD-10-CM

## 2024-03-24 DIAGNOSIS — Z5111 Encounter for antineoplastic chemotherapy: Secondary | ICD-10-CM | POA: Diagnosis present

## 2024-03-24 LAB — CBC WITH DIFFERENTIAL (CANCER CENTER ONLY)
Abs Immature Granulocytes: 0.02 10*3/uL (ref 0.00–0.07)
Basophils Absolute: 0 10*3/uL (ref 0.0–0.1)
Basophils Relative: 0 %
Eosinophils Absolute: 0.1 10*3/uL (ref 0.0–0.5)
Eosinophils Relative: 2 %
HCT: 41.7 % (ref 36.0–46.0)
Hemoglobin: 14.1 g/dL (ref 12.0–15.0)
Immature Granulocytes: 0 %
Lymphocytes Relative: 27 %
Lymphs Abs: 2.2 10*3/uL (ref 0.7–4.0)
MCH: 31.5 pg (ref 26.0–34.0)
MCHC: 33.8 g/dL (ref 30.0–36.0)
MCV: 93.3 fL (ref 80.0–100.0)
Monocytes Absolute: 0.7 10*3/uL (ref 0.1–1.0)
Monocytes Relative: 8 %
Neutro Abs: 5.2 10*3/uL (ref 1.7–7.7)
Neutrophils Relative %: 63 %
Platelet Count: 269 10*3/uL (ref 150–400)
RBC: 4.47 MIL/uL (ref 3.87–5.11)
RDW: 13.7 % (ref 11.5–15.5)
WBC Count: 8.3 10*3/uL (ref 4.0–10.5)
nRBC: 0 % (ref 0.0–0.2)

## 2024-03-24 LAB — BASIC METABOLIC PANEL - CANCER CENTER ONLY
Anion gap: 7 (ref 5–15)
BUN: 7 mg/dL (ref 6–20)
CO2: 28 mmol/L (ref 22–32)
Calcium: 9.4 mg/dL (ref 8.9–10.3)
Chloride: 103 mmol/L (ref 98–111)
Creatinine: 0.76 mg/dL (ref 0.44–1.00)
GFR, Estimated: >60 mL/min (ref >60)
Glucose, Bld: 104 mg/dL — ABNORMAL HIGH (ref 70–99)
Potassium: 3.6 mmol/L (ref 3.5–5.1)
Sodium: 138 mmol/L (ref 135–145)

## 2024-03-24 LAB — HEPATIC FUNCTION PANEL
ALT: 15 U/L (ref 0–44)
AST: 23 U/L (ref 15–41)
Albumin: 3.6 g/dL (ref 3.5–5.0)
Alkaline Phosphatase: 102 U/L (ref 38–126)
Bilirubin, Direct: <0.1 mg/dL (ref 0.0–0.2)
Total Bilirubin: 0.9 mg/dL (ref 0.0–1.2)
Total Protein: 8.3 g/dL — ABNORMAL HIGH (ref 6.5–8.1)

## 2024-03-24 LAB — PREGNANCY, URINE: Preg Test, Ur: NEGATIVE

## 2024-03-24 MED ORDER — DEXTROSE 5 % IV SOLN: INTRAVENOUS | Status: DC

## 2024-03-24 MED ORDER — PALONOSETRON HCL INJECTION 0.25 MG/5ML
0.2500 mg | Freq: Once | INTRAVENOUS | Status: AC
  Administered 2024-03-24: 0.25 mg via INTRAVENOUS
  Filled 2024-03-24: qty 5

## 2024-03-24 MED ORDER — SODIUM CHLORIDE 0.9% FLUSH
10.0000 mL | Freq: Once | INTRAVENOUS | Status: AC
  Administered 2024-03-24: 10 mL

## 2024-03-24 MED ORDER — OXALIPLATIN CHEMO INJECTION 100 MG/20ML
50.0000 mg/m2 | Freq: Once | INTRAVENOUS | Status: AC
  Administered 2024-03-24: 100 mg via INTRAVENOUS
  Filled 2024-03-24: qty 20

## 2024-03-24 MED ORDER — DEXAMETHASONE SODIUM PHOSPHATE 10 MG/ML IJ SOLN
10.0000 mg | Freq: Once | INTRAMUSCULAR | Status: AC
  Administered 2024-03-24: 10 mg via INTRAVENOUS
  Filled 2024-03-24: qty 1

## 2024-03-31 ENCOUNTER — Other Ambulatory Visit (HOSPITAL_COMMUNITY): Payer: Self-pay

## 2024-04-06 ENCOUNTER — Other Ambulatory Visit: Payer: Self-pay

## 2024-04-06 ENCOUNTER — Telehealth: Payer: Self-pay

## 2024-04-06 NOTE — Telephone Encounter (Signed)
 Spoke with patient via telephone call. Patient stated she woke up this morning with sharpe pains in the R-lower back and stomach area on the right side. Patient describes the stomach pain as tender to the touch and she feels bloated.  Patient has taken muscle relaxers and Tylenol  to help with the pain but she is still having the pains with no relief.  Patient expressed she also feels constipated.  Taking Miralax  to help make BM. Had small BM yesterday, stool was formed but not hard.  Urinating well and drinking fluids well.  Appetite is decent but slightly decreased with the stomach pain.  Let patient know I would forward her message to the provider to advise further.

## 2024-04-07 ENCOUNTER — Other Ambulatory Visit

## 2024-04-07 ENCOUNTER — Ambulatory Visit

## 2024-04-07 ENCOUNTER — Telehealth: Payer: Self-pay

## 2024-04-07 ENCOUNTER — Ambulatory Visit: Admitting: Nurse Practitioner

## 2024-04-07 ENCOUNTER — Other Ambulatory Visit: Payer: Self-pay

## 2024-04-07 NOTE — Telephone Encounter (Signed)
 Attempted to contact the patient via telephone call, per [facility].  Unable to reach the patient. Left detailed voicemail stating [facility] can see patient tomorrow at 10am, and requested a call back.

## 2024-04-07 NOTE — Telephone Encounter (Signed)
 See note from Roselle Conner, RN.

## 2024-04-07 NOTE — Telephone Encounter (Signed)
 This RN called and spoke with Ms.Cowher regarding [facility] appt on 04/08/2024. Patient informed this RN that her pain resolved with motrin . D/t symptoms being managed at home and no further pain complaints from the Pt, [facility] appt on 04/08/2024 was canceled. This RN educated Patient to call if symptoms reoccur and are not controlled with home medications. Patient verbalized understanding and was agreeable with cancellation of appointment and plan moving forward.

## 2024-04-08 ENCOUNTER — Inpatient Hospital Stay: Admitting: Physician Assistant

## 2024-04-15 ENCOUNTER — Other Ambulatory Visit: Payer: Self-pay | Admitting: Hematology

## 2024-04-15 ENCOUNTER — Other Ambulatory Visit: Payer: Self-pay

## 2024-04-15 DIAGNOSIS — C172 Malignant neoplasm of ileum: Secondary | ICD-10-CM

## 2024-04-15 MED ORDER — CAPECITABINE 500 MG PO TABS
ORAL_TABLET | ORAL | 1 refills | Status: DC
Start: 2024-04-15 — End: 2024-06-02
  Filled 2024-04-15: qty 70, fill #0
  Filled 2024-04-19: qty 70, 28d supply, fill #0
  Filled 2024-05-26: qty 70, 28d supply, fill #1

## 2024-04-19 ENCOUNTER — Other Ambulatory Visit: Payer: Self-pay

## 2024-04-20 ENCOUNTER — Other Ambulatory Visit: Payer: Self-pay

## 2024-04-20 NOTE — Progress Notes (Signed)
 Specialty Pharmacy Refill Coordination Note  Linda [name] is a 58 y.o. female contacted today regarding refills of specialty medication(s) Capecitabine  (XELODA )   Patient requested Delivery   Delivery date: 04/27/24   Verified address: [address] [place], [place] [address]   Medication will be filled on 07.07.25.

## 2024-04-21 ENCOUNTER — Ambulatory Visit (HOSPITAL_COMMUNITY)
Admission: RE | Admit: 2024-04-21 | Discharge: 2024-04-21 | Disposition: A | Discharge status: Home / Self Care | Source: Ambulatory Visit | Attending: Nurse Practitioner | Admitting: Nurse Practitioner

## 2024-04-21 DIAGNOSIS — C172 Malignant neoplasm of ileum: Secondary | ICD-10-CM | POA: Diagnosis present

## 2024-04-21 MED ORDER — IOHEXOL 9 MG/ML PO SOLN
ORAL | Status: AC
  Filled 2024-04-21: qty 1000

## 2024-04-21 MED ORDER — HEPARIN SOD (PORK) LOCK FLUSH 100 UNIT/ML IV SOLN
500.0000 [IU] | Freq: Once | INTRAVENOUS | Status: AC
  Administered 2024-04-21: 500 [IU] via INTRAVENOUS

## 2024-04-21 MED ORDER — IOHEXOL 9 MG/ML PO SOLN
1000.0000 mL | ORAL | Status: AC
  Administered 2024-04-21: 1000 mL via ORAL

## 2024-04-21 MED ORDER — IOHEXOL 300 MG/ML  SOLN
100.0000 mL | Freq: Once | INTRAMUSCULAR | Status: AC | PRN
Start: 2024-04-21 — End: 2024-04-21
  Administered 2024-04-21: 100 mL via INTRAVENOUS

## 2024-04-26 ENCOUNTER — Other Ambulatory Visit: Payer: Self-pay

## 2024-04-26 ENCOUNTER — Telehealth: Payer: Self-pay

## 2024-04-26 NOTE — Telephone Encounter (Signed)
 Patient c/o of the same back pain that she called about 1 month ago. Patient stated pain returned Thursday w/ stomach pain-grade 2 but heightens to grade 7 interfering with sleep. Patient describes the pain as dull-radiating from the lower right side of her back around to the front right lower side of her stomach. Patient states the ibuprofen  is helping but not resolving the pain.  Patient also reports some vaginal spotting when she goes to urinate x Friday.  Patient denies any dysuria.  Patient expressed that she does have transportation issues and that if she can be seen in [facility] today then transportation would have to be arranged prior.  Let patient know that I would forward her message to [facility] and Dr. Demetra team and someone will f/u with her.

## 2024-04-26 NOTE — Telephone Encounter (Signed)
 Patient left a voicemail at 10:28am c/o of stomach/back pain, & requesting a c/b @T [phone].  Attempted to contact the patient at 10:37am @T [phone]. Unable to reach the patient. Left voicemail stating to contact the facility @T [phone].

## 2024-04-26 NOTE — Telephone Encounter (Signed)
 Called patient regarding message left that she had been having increased back/stomach pain since last Thursday. Patient noted that she has been dealing with constipation and unable to note when she last had a good BM. Patient notes that she is still passing gas, but notes bloating sensation that has been causing int. discomfort.  Patient does not endorse any n/v at this time or any s/s at this time.  Patient does note some spotting when she wipes. Patient states that LMP was over a year ago prior to diagnosis, but she has episodes of spotting intermittently. Blood is noted as dark red or brown. Patient denies any blood in urine or stool at this time.  Patient states that she has tried taking miralax  once a day with little relief. Per Mallie Combes, PA-C - patient can increased miralax  use to BID and to take dulcolax BID until patient obtains relief from constipation. Recommendations relayed to patient in addition to increasing fluid intake. Patient verbalized an understanding of the information and is agreeable to trying these interventions.  Patient aware that she has scheduled appointments on 7/9, however if she feels she needs to be seen sooner she can call back tomorrow and [facility] will evaluate her.  Patient agreeable to the plan.

## 2024-04-28 ENCOUNTER — Telehealth: Payer: Self-pay | Admitting: Hematology

## 2024-04-28 ENCOUNTER — Inpatient Hospital Stay

## 2024-04-28 ENCOUNTER — Inpatient Hospital Stay: Attending: Hematology | Admitting: Hematology

## 2024-04-28 VITALS — BP 144/92 | HR 73 | Temp 98.2°F | Resp 14 | Ht 64.0 in | Wt 197.7 lb

## 2024-04-28 DIAGNOSIS — C787 Secondary malignant neoplasm of liver and intrahepatic bile duct: Secondary | ICD-10-CM | POA: Diagnosis present

## 2024-04-28 DIAGNOSIS — Z5111 Encounter for antineoplastic chemotherapy: Secondary | ICD-10-CM | POA: Diagnosis present

## 2024-04-28 DIAGNOSIS — C786 Secondary malignant neoplasm of retroperitoneum and peritoneum: Secondary | ICD-10-CM | POA: Insufficient documentation

## 2024-04-28 DIAGNOSIS — C172 Malignant neoplasm of ileum: Secondary | ICD-10-CM

## 2024-04-28 DIAGNOSIS — Z95828 Presence of other vascular implants and grafts: Secondary | ICD-10-CM

## 2024-04-28 DIAGNOSIS — Z79899 Other long term (current) drug therapy: Secondary | ICD-10-CM | POA: Diagnosis not present

## 2024-04-28 DIAGNOSIS — D500 Iron deficiency anemia secondary to blood loss (chronic): Secondary | ICD-10-CM

## 2024-04-28 LAB — CBC WITH DIFFERENTIAL (CANCER CENTER ONLY)
Abs Immature Granulocytes: 0.04 K/uL (ref 0.00–0.07)
Basophils Absolute: 0 K/uL (ref 0.0–0.1)
Basophils Relative: 0 %
Eosinophils Absolute: 0.1 K/uL (ref 0.0–0.5)
Eosinophils Relative: 1 %
HCT: 40.6 % (ref 36.0–46.0)
Hemoglobin: 13.8 g/dL (ref 12.0–15.0)
Immature Granulocytes: 0 %
Lymphocytes Relative: 23 %
Lymphs Abs: 2.3 K/uL (ref 0.7–4.0)
MCH: 31.4 pg (ref 26.0–34.0)
MCHC: 34 g/dL (ref 30.0–36.0)
MCV: 92.3 fL (ref 80.0–100.0)
Monocytes Absolute: 0.9 K/uL (ref 0.1–1.0)
Monocytes Relative: 9 %
Neutro Abs: 6.6 K/uL (ref 1.7–7.7)
Neutrophils Relative %: 67 %
Platelet Count: 307 K/uL (ref 150–400)
RBC: 4.4 MIL/uL (ref 3.87–5.11)
RDW: 13.7 % (ref 11.5–15.5)
WBC Count: 9.9 K/uL (ref 4.0–10.5)
nRBC: 0 % (ref 0.0–0.2)

## 2024-04-28 LAB — BASIC METABOLIC PANEL - CANCER CENTER ONLY
Anion gap: 8 (ref 5–15)
BUN: 7 mg/dL (ref 6–20)
CO2: 26 mmol/L (ref 22–32)
Calcium: 9.4 mg/dL (ref 8.9–10.3)
Chloride: 103 mmol/L (ref 98–111)
Creatinine: 0.79 mg/dL (ref 0.44–1.00)
GFR, Estimated: >60 mL/min (ref >60)
Glucose, Bld: 105 mg/dL — ABNORMAL HIGH (ref 70–99)
Potassium: 3.5 mmol/L (ref 3.5–5.1)
Sodium: 137 mmol/L (ref 135–145)

## 2024-04-28 LAB — HEPATIC FUNCTION PANEL
ALT: 15 U/L (ref 0–44)
AST: 24 U/L (ref 15–41)
Albumin: 3.7 g/dL (ref 3.5–5.0)
Alkaline Phosphatase: 91 U/L (ref 38–126)
Bilirubin, Direct: 0.2 mg/dL (ref 0.0–0.2)
Indirect Bilirubin: 0.8 mg/dL (ref 0.3–0.9)
Total Bilirubin: 1 mg/dL (ref 0.0–1.2)
Total Protein: 8 g/dL (ref 6.5–8.1)

## 2024-04-28 LAB — FERRITIN: Ferritin: 215 ng/mL (ref 11–307)

## 2024-04-28 LAB — CEA (ACCESS): CEA (CHCC): 87.38 ng/mL — ABNORMAL HIGH (ref 0.00–5.00)

## 2024-04-28 LAB — PREGNANCY, URINE: Preg Test, Ur: NEGATIVE

## 2024-04-28 MED ORDER — SODIUM CHLORIDE 0.9% FLUSH
10.0000 mL | Freq: Once | INTRAVENOUS | Status: AC
  Administered 2024-04-28: 10 mL

## 2024-04-28 NOTE — Progress Notes (Signed)
 DISCONTINUE ON PATHWAY REGIMEN - Colorectal     A cycle is every 14 days:     Oxaliplatin       Leucovorin       Fluorouracil       Fluorouracil    **Always confirm dose/schedule in your pharmacy ordering system**  PRIOR TREATMENT: [id number]: mFOLFOX6 q14 Days  START ON PATHWAY REGIMEN - Colorectal     A cycle is every 21 days:     Capecitabine       Irinotecan   **Always confirm dose/schedule in your pharmacy ordering system**  Patient Characteristics: Distant Metastases, Nonsurgical Candidate, Non-KRAS G12C, RAS Mutation Positive/Unknown (BRAF V600 Wild-Type/Unknown), Standard Cytotoxic Therapy, Second Line Standard Cytotoxic Therapy, Bevacizumab Ineligible Tumor Location: Colon Therapeutic Status: Distant Metastases Microsatellite/Mismatch Repair Status: MSS/pMMR BRAF Mutation Status: Wild-Type (no mutation) KRAS/NRAS Mutation Status: Non-KRAS G12C, RAS Mutation Positive Preferred Therapy Approach: Standard Cytotoxic Therapy Standard Cytotoxic Line of Therapy: Second Line Standard Cytotoxic Therapy Bevacizumab Eligibility: Ineligible Intent of Therapy: Non-Curative / Palliative Intent, Discussed with Patient

## 2024-04-28 NOTE — Progress Notes (Signed)
 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  [name] in the liver MRI also showed a 1.8 cm oligo met in segment 7 of liver. -We discussed the incurable nature of her metastatic disease, due to her metastasis in peritoneum and liver.  We also discussed the small possibility of HIPEC and liver directed therapy if she has excellent response to chemotherapy. -I recommend first-line chemotherapy FOLFOX.  She started on July 31, 2023, oxaliplatin  was held for first cycle due to open wound. -She developed seizure activity after first cycle of 5-FU infusion. She was evaluated by new oncologist Dr. Buckley, her seizure was felt to be unlikely related to 5-FU. She felt that seizure was coming after cycle 2 chemo when she coming in for pump d/c and required ativan . I changed her cehmo to CAPOX on 08/28/2023. She required dose reduction  -restaging CT 11/11/2023 showed mixed response with improved liver met and slightly worse peritoneal mets, I slightly increased her xeloda  dose  -restaging CT 02/04/2024 showed stable disease  -unfortunately scan 7/2/205 showed worsening peritoneal metastasis -Plan to change chemotherapy to irinotecan and Xeloda   Assessment & Plan Metastatic small  bowel cancer Progression of metastatic small bowel cancer with worsening peritoneal metastasis. CT scan shows new cystic collection in the right hemipelvis and increased size of some nodules. Current chemotherapy regimen is no longer effective. - Initiate irinotecan as the new chemotherapy regimen due to its different side effect profile compared to oxaliplatin , including less neuropathy and no cold sensitivity, but with potential for diarrhea and hair loss. - Continue capecitabine  at a dose of two tablets in the morning and three in the evening. - Coordinate with insurance for approval of irinotecan. - Schedule infusion for irinotecan next week. - Consider clinical trials for KRAS mutation in the future if standard treatments are exhausted.  Pain due to cancer Pain in the right pelvic area likely related to the progression of peritoneal metastasis. Pain has improved with increased MiraLAX  and ibuprofen  but persists at a mild level (1-2/10). - Continue ibuprofen  as needed, but consider alternating with acetaminophen  to reduce ibuprofen  intake. - Prescribe tramadol for pain management, especially at night, with caution for constipation and drowsiness. - Monitor bowel movements and increase laxatives like MiraLAX  if needed. - Consider referral to gynecologic oncology if cystic lesion in the pelvis causes significant symptoms.  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 Current regimen adjusted to oral capecitabine  to mitigate this risk. - Continue with oral capecitabine  regimen to avoid seizures associated with pump infusion.  Fatty liver Well-managed fatty liver with no significant changes noted on CT scan.  Plan - I reviewed her restaging CT scan, which showed worsening peritoneal metastasis - Will switch her chemotherapy to irinotecan and capecitabine  every 2 weeks, she previously did not tolerate a 5-FU pump infusion  due to seizures, plan to  start next week, I will see her with second cycle chemotherapy - She was slightly increased capecitabine  dose to 2 tablets in the morning and 3 tablets in the evening, 7 days on and 7 days off. - Will slightly reduce irinotecan dose for first cycle   SUMMARY OF ONCOLOGIC HISTORY: Oncology History  Cancer of ileum with carcinomatosis  07/10/2023 Initial Diagnosis   Cancer of ileum with carcinomatosis   07/22/2023 Cancer Staging   Staging form: Small Intestine - Adenocarcinoma, AJCC 8th Edition - Pathologic: Stage IV (pT4, pNX, pM1) - Signed by Lanny Callander, MD on 07/22/2023 Histologic grade (G): G2 Histologic grading system: 4 grade system   07/31/2023 - 08/16/2023 Chemotherapy   Patient is on Treatment Plan : COLORECTAL FOLFOX q14d     08/28/2023 - 03/24/2024 Chemotherapy   Patient is on Treatment Plan : COLORECTAL CAPEOX (130/850) q21d x 8 cycles     05/05/2024 -  Chemotherapy   Patient is on Treatment Plan : COLORECTAL  Irinotecan (200) D1 + Capecitabine  D1-7 (XELIRI) q14 Days        Discussed the use of AI scribe software for clinical note transcription with the patient, who gave verbal consent to proceed.  History of Present Illness Linda [name] is a 58 year old female with metastatic small bowel cancer who presents for follow-up.  She experiences pelvic pain, particularly on the right side, which began last Thursday. Initially severe, the pain has decreased with increased MiraLAX  and fluid intake, currently rated at 1-2 out of 10.  She is on a low dose of capecitabine , taking two pills in the morning and two in the evening, due to previous issues with higher doses causing numbness. She manages pain with 800 mg of ibuprofen  two to three times a day and is open to alternating with Tylenol .  She has a history of seizures associated with pump infusions, occurring every time she used the pump. Her neuropathy remains stable and does not interfere with daily activities.     All  other systems were reviewed with the patient and are negative.  MEDICAL HISTORY:  Past Medical History:  Diagnosis Date   Abnormal uterine bleeding 04/10/2022   Anemia    Hypertension    Pre-diabetes 02/2022    SURGICAL HISTORY: Past Surgical History:  Procedure Laterality Date   BREAST LUMPECTOMY WITH RADIOACTIVE SEED LOCALIZATION Left 06/10/2022   Procedure: LEFT BREAST LUMPECTOMY WITH RADIOACTIVE SEED LOCALIZATION;  Surgeon: Curvin Deward MOULD, MD;  Location: [facility];  Service: General;  Laterality: Left;   CESAREAN SECTION     x4   IR IMAGING GUIDED PORT INSERTION  07/08/2023   LAPAROTOMY N/A 06/30/2023   Procedure: EXPLORATORY LAPAROTOMY, SMALL BOWEL RESECTION, AND EXCISIONAL BIOPSIES OF MULTIPLE OMENTAL MASSES;  Surgeon: Signe Mitzie DELENA, MD;  Location: [facility];  Service: General;  Laterality: N/A;   TUBAL LIGATION      I have reviewed the social history and family history with the patient and they are unchanged from previous note.  ALLERGIES:  has no known allergies.  MEDICATIONS:  Current Outpatient Medications  Medication Sig Dispense Refill   acetaminophen  (TYLENOL ) 500 MG tablet Take 2 tablets (1,000 mg total) by mouth every 8 (eight) hours as needed for mild pain or fever.     capecitabine  (XELODA ) 500 MG tablet TAKE  2 tabs in morning and 3 tabs in evening, every 10-12 hours. Take within 30 minutes after meal. Take for 7 days  on, then 7 days off, repeat every 14 days. 70 tablet 1   diclofenac  Sodium (VOLTAREN ) 1 % GEL Research Patient: Apply 0.5 grams (1 fingertip) to each hand and each foot twice daily for up to 12 weeks 400 g 0   Lacosamide  100 MG TABS Take 1 tablet (100 mg total) by mouth 2 (two) times daily. 60 tablet 3   levETIRAcetam  (KEPPRA ) 1000 MG tablet Take 1 tablet (1,000 mg total) by mouth 2 (two) times daily. 120 tablet 0   levETIRAcetam  (KEPPRA ) 1000 MG tablet Take 1 tablet (1,000 mg total) by mouth 2 (two) times daily. 60 tablet 0    LORazepam  (ATIVAN ) 1 MG tablet Take 1 tablet (1 mg total) by mouth every 6 (six) hours as needed for anxiety. 30 tablet 0   methocarbamol  (ROBAXIN ) 500 MG tablet Take 2 tablets (1,000 mg total) by mouth every 8 (eight) hours as needed for muscle spasms. 30 tablet 0   nitroGLYCERIN  (NITROSTAT ) 0.4 MG SL tablet Place 1 tablet (0.4 mg total) under the tongue every 5 (five) minutes as needed for chest pain. 25 tablet 0   ondansetron  (ZOFRAN ) 8 MG tablet Take 1 tablet (8 mg total) by mouth every 8 (eight) hours as needed for nausea or vomiting. 30 tablet 2   polyethylene glycol powder (GLYCOLAX /MIRALAX ) 17 GM/SCOOP powder Take 17 g by mouth daily mixed in liquid as directed. 238 g 0   potassium chloride  SA (KLOR-CON  M) 20 MEQ tablet Take 1 tablet (20 mEq total) by mouth daily. Do not crush or break-up 30 tablet 0   simethicone  (MYLICON) 80 MG chewable tablet Chew 1 tablet (80 mg total) by mouth 4 (four) times daily as needed for flatulence. 100 tablet 0   valsartan  (DIOVAN ) 40 MG tablet Take 1 tablet (40 mg total) by mouth daily. 90 tablet 2   No current facility-administered medications for this visit.    PHYSICAL EXAMINATION: ECOG PERFORMANCE STATUS: 1 - Symptomatic but completely ambulatory  Vitals:   04/28/24 1049  BP: (!) 144/92  Pulse: 73  Resp: 14  Temp: 98.2 F (36.8 C)  SpO2: 98%   Wt Readings from Last 3 Encounters:  04/28/24 89.7 kg (197 lb 11.2 oz)  03/24/24 91.2 kg (201 lb)  02/25/24 91.9 kg (202 lb 8 oz)     GENERAL:alert, no distress and comfortable SKIN: skin color, texture, turgor are normal, no rashes or significant lesions EYES: normal, Conjunctiva are pink and non-injected, sclera clear NECK: supple, thyroid  normal size, non-tender, without nodularity LYMPH:  no palpable lymphadenopathy in the cervical, axillary  LUNGS: clear to auscultation and percussion with normal breathing effort HEART: regular rate & rhythm and no murmurs and no lower extremity  edema ABDOMEN:abdomen soft, non-tender and normal bowel sounds Musculoskeletal:no cyanosis of digits and no clubbing  NEURO: alert & oriented x 3 with fluent speech, no focal motor/sensory deficits  Physical Exam MEASUREMENTS: Weight- 197.  LABORATORY DATA:  I have reviewed the data as listed    Latest Ref Rng & Units 04/28/2024   10:31 AM 03/24/2024    8:07 AM 02/25/2024   10:16 AM  CBC  WBC 4.0 - 10.5 K/uL 9.9  8.3  8.2   Hemoglobin 12.0 - 15.0 g/dL 86.1  85.8  85.7   Hematocrit 36.0 - 46.0 % 40.6  41.7  41.1   Platelets 150 - 400 K/uL 307  269  207         Latest Ref Rng & Units 04/28/2024   10:31  AM 03/24/2024    8:07 AM 02/25/2024   10:16 AM  CMP  Glucose 70 - 99 mg/dL 894  895  842   BUN 6 - 20 mg/dL 7  7  8    Creatinine 0.44 - 1.00 mg/dL 9.20  9.23  9.20   Sodium 135 - 145 mmol/L 137  138  136   Potassium 3.5 - 5.1 mmol/L 3.5  3.6  3.3   Chloride 98 - 111 mmol/L 103  103  100   CO2 22 - 32 mmol/L 26  28  26    Calcium  8.9 - 10.3 mg/dL 9.4  9.4  9.2   Total Protein 6.5 - 8.1 g/dL 8.0  8.3  8.3   Total Bilirubin 0.0 - 1.2 mg/dL 1.0  0.9  0.8   Alkaline Phos 38 - 126 U/L 91  102  94   AST 15 - 41 U/L 24  23  32   ALT 0 - 44 U/L 15  15  19        RADIOGRAPHIC STUDIES: I have personally reviewed the radiological images as listed and agreed with the findings in the report. No results found.    Orders Placed This Encounter  Procedures   Consent Attestation for Oncology Treatment    The patient is informed of risks, benefits, side-effects of the prescribed oncology treatment. Potential short term and long term side effects and response rates discussed. After a long discussion, the patient made informed decision to proceed.:   Yes   CBC with Differential (Cancer Center Only)    Standing Status:   Future    Expected Date:   05/05/2024    Expiration Date:   05/05/2025   CMP (Cancer Center only)    Standing Status:   Future    Expected Date:   05/05/2024    Expiration Date:    05/05/2025   CBC with Differential (Cancer Center Only)    Standing Status:   Future    Expected Date:   05/19/2024    Expiration Date:   05/19/2025   CMP (Cancer Center only)    Standing Status:   Future    Expected Date:   05/19/2024    Expiration Date:   05/19/2025   CBC with Differential (Cancer Center Only)    Standing Status:   Future    Expected Date:   06/02/2024    Expiration Date:   06/02/2025   CMP (Cancer Center only)    Standing Status:   Future    Expected Date:   06/02/2024    Expiration Date:   06/02/2025   CBC with Differential (Cancer Center Only)    Standing Status:   Future    Expected Date:   06/16/2024    Expiration Date:   06/16/2025   CMP (Cancer Center only)    Standing Status:   Future    Expected Date:   06/16/2024    Expiration Date:   06/16/2025   All questions were answered. The patient knows to call the clinic with any problems, questions or concerns. No barriers to learning was detected. The total time spent in the appointment was 40 minutes, including review of chart and various tests results, discussions about plan of care and coordination of care plan     Onita Mattock, MD 04/28/2024

## 2024-04-28 NOTE — Assessment & Plan Note (Addendum)
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  [name] in the liver MRI also showed a 1.8 cm oligo met in segment 7 of liver. -We discussed the incurable nature of her metastatic disease, due to her metastasis in peritoneum and liver.  We also discussed the small possibility of HIPEC and liver directed therapy if she has excellent response to chemotherapy. -I recommend first-line chemotherapy FOLFOX.  She started on July 31, 2023, oxaliplatin  was held for first cycle due to open wound. -She developed seizure activity after first cycle of 5-FU infusion. She was evaluated by new oncologist Dr. Buckley, her seizure was felt to be unlikely related to 5-FU. She felt that seizure was coming after cycle 2 chemo when she coming in for pump d/c and required ativan . I changed her cehmo to CAPOX on 08/28/2023. She required dose reduction  -restaging CT 11/11/2023 showed mixed response with improved liver met and slightly worse peritoneal mets, I slightly increased her xeloda  dose  -restaging CT 02/04/2024 showed stable disease  -unfortunately scan 7/2/205 showed worsening peritoneal metastasis -Plan to change chemotherapy to irinotecan and Xeloda

## 2024-05-05 ENCOUNTER — Other Ambulatory Visit: Payer: Self-pay

## 2024-05-05 ENCOUNTER — Encounter: Payer: Self-pay | Admitting: Pharmacist

## 2024-05-05 ENCOUNTER — Other Ambulatory Visit (HOSPITAL_COMMUNITY): Payer: Self-pay

## 2024-05-05 ENCOUNTER — Inpatient Hospital Stay

## 2024-05-05 VITALS — BP 158/80 | HR 77 | Temp 98.4°F | Resp 16 | Wt 196.0 lb

## 2024-05-05 DIAGNOSIS — Z95828 Presence of other vascular implants and grafts: Secondary | ICD-10-CM

## 2024-05-05 DIAGNOSIS — C172 Malignant neoplasm of ileum: Secondary | ICD-10-CM

## 2024-05-05 DIAGNOSIS — Z5111 Encounter for antineoplastic chemotherapy: Secondary | ICD-10-CM | POA: Diagnosis not present

## 2024-05-05 LAB — CBC WITH DIFFERENTIAL (CANCER CENTER ONLY)
Abs Immature Granulocytes: 0.03 K/uL (ref 0.00–0.07)
Basophils Absolute: 0 K/uL (ref 0.0–0.1)
Basophils Relative: 0 %
Eosinophils Absolute: 0 K/uL (ref 0.0–0.5)
Eosinophils Relative: 0 %
HCT: 40 % (ref 36.0–46.0)
Hemoglobin: 13.5 g/dL (ref 12.0–15.0)
Immature Granulocytes: 0 %
Lymphocytes Relative: 23 %
Lymphs Abs: 2.1 K/uL (ref 0.7–4.0)
MCH: 30.8 pg (ref 26.0–34.0)
MCHC: 33.8 g/dL (ref 30.0–36.0)
MCV: 91.1 fL (ref 80.0–100.0)
Monocytes Absolute: 0.7 K/uL (ref 0.1–1.0)
Monocytes Relative: 7 %
Neutro Abs: 6.1 K/uL (ref 1.7–7.7)
Neutrophils Relative %: 70 %
Platelet Count: 338 K/uL (ref 150–400)
RBC: 4.39 MIL/uL (ref 3.87–5.11)
RDW: 13.5 % (ref 11.5–15.5)
WBC Count: 9 K/uL (ref 4.0–10.5)
nRBC: 0 % (ref 0.0–0.2)

## 2024-05-05 LAB — CMP (CANCER CENTER ONLY)
ALT: 10 U/L (ref 0–44)
AST: 16 U/L (ref 15–41)
Albumin: 4 g/dL (ref 3.5–5.0)
Alkaline Phosphatase: 99 U/L (ref 38–126)
Anion gap: 8 (ref 5–15)
BUN: 9 mg/dL (ref 6–20)
CO2: 28 mmol/L (ref 22–32)
Calcium: 9.5 mg/dL (ref 8.9–10.3)
Chloride: 102 mmol/L (ref 98–111)
Creatinine: 0.76 mg/dL (ref 0.44–1.00)
GFR, Estimated: >60 mL/min (ref >60)
Glucose, Bld: 130 mg/dL — ABNORMAL HIGH (ref 70–99)
Potassium: 3.4 mmol/L — ABNORMAL LOW (ref 3.5–5.1)
Sodium: 138 mmol/L (ref 135–145)
Total Bilirubin: 0.5 mg/dL (ref 0.0–1.2)
Total Protein: 8.7 g/dL — ABNORMAL HIGH (ref 6.5–8.1)

## 2024-05-05 MED ORDER — HEPARIN SOD (PORK) LOCK FLUSH 100 UNIT/ML IV SOLN: 500.0000 [IU] | Freq: Once | INTRAVENOUS | Status: DC | PRN

## 2024-05-05 MED ORDER — SODIUM CHLORIDE 0.9 % IV SOLN
160.0000 mg/m2 | Freq: Once | INTRAVENOUS | Status: AC
  Administered 2024-05-05: 320 mg via INTRAVENOUS
  Filled 2024-05-05: qty 15

## 2024-05-05 MED ORDER — PALONOSETRON HCL INJECTION 0.25 MG/5ML
0.2500 mg | Freq: Once | INTRAVENOUS | Status: AC
  Administered 2024-05-05: 0.25 mg via INTRAVENOUS
  Filled 2024-05-05: qty 5

## 2024-05-05 MED ORDER — DEXAMETHASONE SODIUM PHOSPHATE 10 MG/ML IJ SOLN
10.0000 mg | Freq: Once | INTRAMUSCULAR | Status: AC
  Administered 2024-05-05: 10 mg via INTRAVENOUS

## 2024-05-05 MED ORDER — ATROPINE SULFATE 1 MG/ML IV SOLN
0.5000 mg | Freq: Once | INTRAVENOUS | Status: DC | PRN
Start: 2024-05-05 — End: 2024-05-05
  Filled 2024-05-05: qty 1

## 2024-05-05 MED ORDER — PROCHLORPERAZINE MALEATE 10 MG PO TABS
10.0000 mg | ORAL_TABLET | Freq: Four times a day (QID) | ORAL | 2 refills | Status: AC | PRN
  Filled 2024-05-05: qty 30, 8d supply, fill #0

## 2024-05-05 MED ORDER — SODIUM CHLORIDE 0.9% FLUSH
10.0000 mL | INTRAVENOUS | Status: DC | PRN
Start: 2024-05-05 — End: 2024-05-05

## 2024-05-05 MED ORDER — SODIUM CHLORIDE 0.9 % IV SOLN: INTRAVENOUS | Status: DC

## 2024-05-05 MED ORDER — SODIUM CHLORIDE 0.9% FLUSH
10.0000 mL | Freq: Once | INTRAVENOUS | Status: AC
Start: 2024-05-05 — End: 2024-05-05
  Administered 2024-05-05: 10 mL

## 2024-05-05 NOTE — Patient Instructions (Signed)
 [facility] - A DEPT OF [facility]  Discharge Instructions: Thank you for choosing [facility] to provide your oncology and hematology care.   If you have a lab appointment with the Cancer Center, please go directly to the Cancer Center and check in at the registration area.   Wear comfortable clothing and clothing appropriate for easy access to any Portacath or PICC line.   We strive to give you quality time with your provider. You may need to reschedule your appointment if you arrive late (15 or more minutes).  Arriving late affects you and other patients whose appointments are after yours.  Also, if you miss three or more appointments without notifying the office, you may be dismissed from the clinic at the provider's discretion.      For prescription refill requests, have your pharmacy contact our office and allow 72 hours for refills to be completed.    Today you received the following chemotherapy and/or immunotherapy agents irinotecan        To help prevent nausea and vomiting after your treatment, we encourage you to take your nausea medication as directed.  BELOW ARE SYMPTOMS THAT SHOULD BE REPORTED IMMEDIATELY: *FEVER GREATER THAN 100.4 F (38 C) OR HIGHER *CHILLS OR SWEATING *NAUSEA AND VOMITING THAT IS NOT CONTROLLED WITH YOUR NAUSEA MEDICATION *UNUSUAL SHORTNESS OF BREATH *UNUSUAL BRUISING OR BLEEDING *URINARY PROBLEMS (pain or burning when urinating, or frequent urination) *BOWEL PROBLEMS (unusual diarrhea, constipation, pain near the anus) TENDERNESS IN MOUTH AND THROAT WITH OR WITHOUT PRESENCE OF ULCERS (sore throat, sores in mouth, or a toothache) UNUSUAL RASH, SWELLING OR PAIN  UNUSUAL VAGINAL DISCHARGE OR ITCHING   Items with * indicate a potential emergency and should be followed up as soon as possible or go to the Emergency Department if any problems should occur.  Please show the CHEMOTHERAPY ALERT CARD or IMMUNOTHERAPY  ALERT CARD at check-in to the Emergency Department and triage nurse.  Should you have questions after your visit or need to cancel or reschedule your appointment, please contact [facility] - A DEPT OF [facility]  Dept: [phone]  and follow the prompts.  Office hours are 8:00 a.m. to 4:30 p.m. Monday - Friday. Please note that voicemails left after 4:00 p.m. may not be returned until the following business day.  We are closed weekends and major holidays. You have access to a nurse at all times for urgent questions. Please call the main number to the clinic Dept: [phone] and follow the prompts.   For any non-urgent questions, you may also contact your provider using MyChart. We now offer e-Visits for anyone 21 and older to request care online for non-urgent symptoms. For details visit [email or web address].   Also download the MyChart app! Go to the app store, search MyChart, open the app, select [facility], and log in with your MyChart username and password.

## 2024-05-05 NOTE — Progress Notes (Signed)
 Pharmacist Chemotherapy Monitoring - Initial Assessment    Anticipated start date: 05/05/24   The following has been reviewed per standard work regarding the patient's treatment regimen: The patient's diagnosis, treatment plan and drug doses, and organ/hematologic function Lab orders and baseline tests specific to treatment regimen  The treatment plan start date, drug sequencing, and pre-medications Prior authorization status  Patient's documented medication list, including drug-drug interaction screen and prescriptions for anti-emetics and supportive care specific to the treatment regimen The drug concentrations, fluid compatibility, administration routes, and timing of the medications to be used The patient's access for treatment and lifetime cumulative dose history, if applicable  The patient's medication allergies and previous infusion related reactions, if applicable   Changes made to treatment plan:  N/A  Follow up needed:  N/A   Niels FORBES Molt, [facility], 05/05/2024  2:33 PM

## 2024-05-05 NOTE — Progress Notes (Signed)
   05/05/2024  Patient ID: Linda [name], female   DOB: 02-16-1966, 58 y.o.   MRN: [id number]  Patient is on the practice statin report. Per chart review, she was on metformin  last year but it was discontinued. Her HgA1c is 6.1%.   Clinical ASCVD: No  The 10-year ASCVD risk score (Arnett DK, et al., 2019) is: 22.8%   Values used to calculate the score:     Age: 86 years     Clincally relevant sex: Female     Is Non-Hispanic African American: Yes     Diabetic: Yes     Tobacco smoker: No     Systolic Blood Pressure: 144 mmHg     Is BP treated: Yes     HDL Cholesterol: 50 mg/dL     Total Cholesterol: 248 mg/dL   Patient has an upcoming appointment on 05/11/24.  A note will be sent to her provider prior to that appointment to request statin assessment.  Follow up after the upcoming appointment.   Cassius DOROTHA Brought, PharmD, BCACP Clinical Pharmacist [phone]

## 2024-05-06 ENCOUNTER — Telehealth: Payer: Self-pay | Admitting: Hematology

## 2024-05-06 NOTE — Telephone Encounter (Signed)
 Called and left VM for the patient to call back to schedule her next set of appointments.

## 2024-05-06 NOTE — Telephone Encounter (Signed)
 Scheduled appointments per WQ. Talked with the patient and she is aware of the made appointments.

## 2024-05-07 ENCOUNTER — Other Ambulatory Visit: Payer: Self-pay

## 2024-05-10 ENCOUNTER — Other Ambulatory Visit (HOSPITAL_COMMUNITY): Payer: Self-pay

## 2024-05-10 ENCOUNTER — Other Ambulatory Visit: Payer: Self-pay

## 2024-05-11 ENCOUNTER — Other Ambulatory Visit (HOSPITAL_COMMUNITY): Payer: Self-pay

## 2024-05-11 ENCOUNTER — Other Ambulatory Visit: Payer: Self-pay | Admitting: Hematology

## 2024-05-11 ENCOUNTER — Inpatient Hospital Stay (HOSPITAL_BASED_OUTPATIENT_CLINIC_OR_DEPARTMENT_OTHER): Payer: BC Managed Care – PPO | Admitting: Internal Medicine

## 2024-05-11 ENCOUNTER — Ambulatory Visit (INDEPENDENT_AMBULATORY_CARE_PROVIDER_SITE_OTHER): Payer: BC Managed Care – PPO | Admitting: Nurse Practitioner

## 2024-05-11 ENCOUNTER — Encounter: Payer: Self-pay | Admitting: Nurse Practitioner

## 2024-05-11 VITALS — BP 120/74 | HR 68 | Temp 98.6°F | Ht 64.0 in | Wt 195.2 lb

## 2024-05-11 VITALS — BP 144/68 | HR 60 | Temp 97.6°F | Resp 17 | Ht 64.0 in | Wt 193.4 lb

## 2024-05-11 DIAGNOSIS — E669 Obesity, unspecified: Secondary | ICD-10-CM

## 2024-05-11 DIAGNOSIS — I10 Essential (primary) hypertension: Secondary | ICD-10-CM | POA: Diagnosis not present

## 2024-05-11 DIAGNOSIS — N939 Abnormal uterine and vaginal bleeding, unspecified: Secondary | ICD-10-CM

## 2024-05-11 DIAGNOSIS — R569 Unspecified convulsions: Secondary | ICD-10-CM

## 2024-05-11 DIAGNOSIS — E1169 Type 2 diabetes mellitus with other specified complication: Secondary | ICD-10-CM

## 2024-05-11 DIAGNOSIS — Z5111 Encounter for antineoplastic chemotherapy: Secondary | ICD-10-CM | POA: Diagnosis not present

## 2024-05-11 DIAGNOSIS — Z2821 Immunization not carried out because of patient refusal: Secondary | ICD-10-CM

## 2024-05-11 DIAGNOSIS — C172 Malignant neoplasm of ileum: Secondary | ICD-10-CM

## 2024-05-11 MED ORDER — TRAMADOL HCL 50 MG PO TABS
50.0000 mg | ORAL_TABLET | Freq: Four times a day (QID) | ORAL | 0 refills | Status: AC | PRN
  Filled 2024-05-11: qty 60, 15d supply, fill #0

## 2024-05-11 NOTE — Assessment & Plan Note (Signed)
 Stable, continue Diovan  as ordered.

## 2024-05-11 NOTE — Progress Notes (Signed)
 LILLETTE Kristeen JINNY Gladis, CMA,acting as a Neurosurgeon for [name], [name].,have documented all relevant documentation on the behalf of [name], [name],as directed by  [name], [name] while in the presence of [name], [name].  Subjective:  Patient ID: Linda [name] , female    DOB: 1966-06-13 , 58 y.o.   MRN: [id number]  Chief Complaint  Patient presents with   Hypertension    Patient presents today for a bp and dm follow up, Patient reports compliance with medication. Patient denies any chest pain, SOB, or headaches. Patient has no concerns today.     HPI  Patient presents for HTN follow up.  Patient started new chemo treatment regemie 6 days ago, her energy is low with stomach and back pain.  She is having vaginal spotting for the past two and half weeks.  Endorses constipation and diarrhea, more loose stools with new chemo.  She has not appetite, but able to keep food down when she eats, endorses drinking water consistently but feels that she needs to increase.  She is taking oxycodone  and ibuprofen , pain starts at an 8/10 then becomes a 3-4/10.    She is taking currently BP medications, has stopped taking keppra  but has an appointment with neurology today.  Has had no recent associated symptoms to hypertension.      Hypertension This is a chronic problem. The current episode started more than 1 year ago. The problem is controlled. Associated symptoms include blurred vision, chest pain (with chemo treatment), headaches, palpitations and shortness of breath (at rest with new chemo treatment). Risk factors for coronary artery disease include obesity and stress.     Past Medical History:  Diagnosis Date   Abnormal uterine bleeding 04/10/2022   Anemia    Blood transfusion without reported diagnosis    Hypertension    Pre-diabetes 02/2022   Stomach cancer (HCC)      Family History  Problem Relation Age of Onset   Memory loss Mother    Hypertension Father    Hypertension Brother     Diabetes Maternal Grandmother    Hypertension Maternal Grandmother    Hypertension Maternal Grandfather    Colon cancer Neg Hx    Stomach cancer Neg Hx    Esophageal cancer Neg Hx      Current Outpatient Medications:    acetaminophen  (TYLENOL ) 500 MG tablet, Take 2 tablets (1,000 mg total) by mouth every 8 (eight) hours as needed for mild pain or fever., Disp: , Rfl:    capecitabine  (XELODA ) 500 MG tablet, TAKE  2 tabs in morning and 3 tabs in evening, every 10-12 hours. Take within 30 minutes after meal. Take for 7 days on, then 7 days off, repeat every 14 days., Disp: 70 tablet, Rfl: 1   diclofenac  Sodium (VOLTAREN ) 1 % GEL, Research Patient: Apply 0.5 grams (1 fingertip) to each hand and each foot twice daily for up to 12 weeks, Disp: 400 g, Rfl: 0   Lacosamide  100 MG TABS, Take 1 tablet (100 mg total) by mouth 2 (two) times daily., Disp: 60 tablet, Rfl: 3   LORazepam  (ATIVAN ) 1 MG tablet, Take 1 tablet (1 mg total) by mouth every 6 (six) hours as needed for anxiety., Disp: 30 tablet, Rfl: 0   methocarbamol  (ROBAXIN ) 500 MG tablet, Take 2 tablets (1,000 mg total) by mouth every 8 (eight) hours as needed for muscle spasms., Disp: 30 tablet, Rfl: 0   nitroGLYCERIN  (NITROSTAT ) 0.4 MG SL tablet, Place 1 tablet (0.4 mg total)  under the tongue every 5 (five) minutes as needed for chest pain., Disp: 25 tablet, Rfl: 0   ondansetron  (ZOFRAN ) 8 MG tablet, Take 1 tablet (8 mg total) by mouth every 8 (eight) hours as needed for nausea or vomiting., Disp: 30 tablet, Rfl: 2   polyethylene glycol powder (GLYCOLAX /MIRALAX ) 17 GM/SCOOP powder, Take 17 g by mouth daily mixed in liquid as directed., Disp: 238 g, Rfl: 0   potassium chloride  SA (KLOR-CON  M) 20 MEQ tablet, Take 1 tablet (20 mEq total) by mouth daily. Do not crush or break-up, Disp: 30 tablet, Rfl: 0   prochlorperazine  (COMPAZINE ) 10 MG tablet, Take 1 tablet (10 mg total) by mouth every 6 (six) hours as needed for nausea or vomiting., Disp: 30  tablet, Rfl: 2   simethicone  (MYLICON) 80 MG chewable tablet, Chew 1 tablet (80 mg total) by mouth 4 (four) times daily as needed for flatulence., Disp: 100 tablet, Rfl: 0   valsartan  (DIOVAN ) 40 MG tablet, Take 1 tablet (40 mg total) by mouth daily., Disp: 90 tablet, Rfl: 2   atorvastatin  (LIPITOR) 10 MG tablet, Take 1 tablet (10 mg total) by mouth daily., Disp: 30 tablet, Rfl: 2   traMADol  (ULTRAM ) 50 MG tablet, Take 1 tablet (50 mg total) by mouth every 6 (six) hours as needed., Disp: 60 tablet, Rfl: 0 No current facility-administered medications for this visit.  Facility-Administered Medications Ordered in Other Visits:    0.9 %  sodium chloride  infusion, , Intravenous, Continuous, Lanny Callander, MD, Stopped at 05/19/24 1659   atropine  injection 0.5 mg, 0.5 mg, Intravenous, Once PRN, Lanny Callander, MD   sodium chloride  flush (NS) 0.9 % injection 10 mL, 10 mL, Intracatheter, PRN, Lanny Callander, MD, 10 mL at 05/19/24 1647   No Known Allergies   Review of Systems  Constitutional:  Positive for fatigue. Negative for chills and fever.  Eyes:  Positive for blurred vision.  Respiratory:  Positive for shortness of breath (at rest with new chemo treatment). Negative for chest tightness.   Cardiovascular:  Positive for chest pain (with chemo treatment) and palpitations. Negative for leg swelling.  Gastrointestinal:  Positive for abdominal pain.  Genitourinary:  Positive for vaginal bleeding (spotting with onset of new chemotherapy).  Musculoskeletal:  Positive for back pain.  Neurological:  Positive for headaches.     Today's Vitals   05/11/24 1052  BP: 120/74  Pulse: 68  Temp: 98.6 F (37 C)  TempSrc: Oral  Weight: 195 lb 3.2 oz (88.5 kg)  Height: 5' 4 (1.626 m)  PainSc: 0-No pain   Body mass index is 33.51 kg/m.  Wt Readings from Last 3 Encounters:  05/19/24 191 lb 11.2 oz (87 kg)  05/11/24 193 lb 6 oz (87.7 kg)  05/11/24 195 lb 3.2 oz (88.5 kg)     Objective:  Physical  Exam Constitutional:      General: She is not in acute distress.    Appearance: Normal appearance.  HENT:     Head: Normocephalic and atraumatic.  Neck:     Vascular: No carotid bruit.  Cardiovascular:     Rate and Rhythm: Normal rate and regular rhythm.     Pulses: Normal pulses.     Heart sounds: Normal heart sounds.  Musculoskeletal:     Cervical back: Normal range of motion.     Right lower leg: No edema.     Left lower leg: No edema.  Lymphadenopathy:     Cervical: No cervical adenopathy.  Skin:  General: Skin is warm and dry.     Capillary Refill: Capillary refill takes less than 2 seconds.  Neurological:     General: No focal deficit present.     Mental Status: She is alert and oriented to person, place, and time. Mental status is at baseline.  Psychiatric:        Mood and Affect: Mood normal.        Behavior: Behavior normal.        Thought Content: Thought content normal.        Judgment: Judgment normal.      Assessment And Plan:  Primary hypertension Assessment & Plan: Stable, continue Diovan  as ordered.     Type 2 diabetes mellitus with obesity (HCC) Assessment & Plan: Hgba1c was increased at last visit, she continues with metformin  and tolerating well. Will recheck A1c today.   Orders: -     Hemoglobin A1c -     Lipid panel  Vaginal spotting Assessment & Plan: She has been having vaginal spotting recently and was to contact Gyn if the symptoms worsened. Will contact Dr. Lanny to see what the next steps would be.    Cancer of ileum with carcinomatosis Assessment & Plan: Continue follow up with Oncology.    Herpes zoster vaccination declined Assessment & Plan: Declines shingrix, educated on disease process and is aware if he changes his mind to notify office    COVID-19 vaccination declined Assessment & Plan: Declines covid 19 vaccine. Discussed risk of covid 25 and if she changes her mind about the vaccine to call the office. Education has  been provided regarding the importance of this vaccine but patient still declined. Advised may receive this vaccine at local pharmacy or Health Dept.or vaccine clinic. Aware to provide a copy of the vaccination record if obtained from local pharmacy or Health Dept.  Encouraged to take multivitamin, vitamin d , vitamin c and zinc to increase immune system. Aware can call office if would like to have vaccine here at office. Verbalized acceptance and understanding.        Return for controlled DM check 4 months.  Patient was given opportunity to ask questions. Patient verbalized understanding of the plan and was able to repeat key elements of the plan. All questions were answered to their satisfaction.     LILLETTE [name], [name], have reviewed all documentation for this visit. The documentation on 05/11/24 for the exam, diagnosis, procedures, and orders are all accurate and complete.  IF YOU HAVE BEEN REFERRED TO A SPECIALIST, IT MAY TAKE 1-2 WEEKS TO SCHEDULE/PROCESS THE REFERRAL. IF YOU HAVE NOT HEARD FROM US /SPECIALIST IN TWO WEEKS, PLEASE GIVE US  A CALL AT [phone].

## 2024-05-11 NOTE — Progress Notes (Signed)
 [facility] at [facility] [address]  [place], [place] [address] [phone]   Interval Evaluation  Date of Service: 05/11/24 Patient Name: Linda [name] Patient MRN: [id number] Patient DOB: May 11, 1966 Provider: Arthea MARLA Manns, [name]  Identifying Statement:  YOUSRA [name] is a 58 y.o. female with Seizures (HCC)   Primary Cancer:  Oncologic History: Oncology History  Cancer of ileum with carcinomatosis  07/10/2023 Initial Diagnosis   Cancer of ileum with carcinomatosis   07/22/2023 Cancer Staging   Staging form: Small Intestine - Adenocarcinoma, AJCC 8th Edition - Pathologic: Stage IV (pT4, pNX, pM1) - Signed by Linda [name] on 07/22/2023 Histologic grade (G): G2 Histologic grading system: 4 grade system   07/31/2023 - 08/16/2023 Chemotherapy   Patient is on Treatment Plan : COLORECTAL FOLFOX q14d     08/28/2023 - 03/24/2024 Chemotherapy   Patient is on Treatment Plan : COLORECTAL CAPEOX (130/850) q21d x 8 cycles     05/05/2024 -  Chemotherapy   Patient is on Treatment Plan : COLORECTAL  Irinotecan  (200) D1 + Capecitabine  D1-7 (XELIRI) q14 Days       Interval History: Linda [name] presents today for follow up.  She remains seizure free since the post-infusion event in October.  Currently on irinotecan  and capecitabine  without significant issue, through Linda [name].  Continues on Vimpat  without any reported side effects.  H+P (08/12/23) Patient presents to review recent neurologic episodes.  She and her daughter describe sudden onset right facial twitching, speech impairment, followed by twitching of all limbs, amnesia.  This was followed by confusion, lasting for almost a day.  Seizure episode occurred ~2 days after infusion of 5-Fu for peritoneal carcinomatosis.  The event recurred following admission to the hospital, similar semiology.  Patient has only faint recollection of each event.  She underwent brain MRI, long term EEG, lumbar puncture,  was started on both Keppra  and Vimpat . Since admission, she reports feeling at her baseline with no breakthrough seizures.  Positive family history of seizures in both mother and brother.  No other notable epilepsy risk factors     Medications: Current Outpatient Medications on File Prior to Visit  Medication Sig Dispense Refill   acetaminophen  (TYLENOL ) 500 MG tablet Take 2 tablets (1,000 mg total) by mouth every 8 (eight) hours as needed for mild pain or fever.     capecitabine  (XELODA ) 500 MG tablet TAKE  2 tabs in morning and 3 tabs in evening, every 10-12 hours. Take within 30 minutes after meal. Take for 7 days on, then 7 days off, repeat every 14 days. 70 tablet 1   diclofenac  Sodium (VOLTAREN ) 1 % GEL Research Patient: Apply 0.5 grams (1 fingertip) to each hand and each foot twice daily for up to 12 weeks 400 g 0   Lacosamide  100 MG TABS Take 1 tablet (100 mg total) by mouth 2 (two) times daily. 60 tablet 3   levETIRAcetam  (KEPPRA ) 1000 MG tablet Take 1 tablet (1,000 mg total) by mouth 2 (two) times daily. 120 tablet 0   levETIRAcetam  (KEPPRA ) 1000 MG tablet Take 1 tablet (1,000 mg total) by mouth 2 (two) times daily. 60 tablet 0   LORazepam  (ATIVAN ) 1 MG tablet Take 1 tablet (1 mg total) by mouth every 6 (six) hours as needed for anxiety. 30 tablet 0   methocarbamol  (ROBAXIN ) 500 MG tablet Take 2 tablets (1,000 mg total) by mouth every 8 (eight) hours as needed for muscle spasms. 30 tablet 0  nitroGLYCERIN  (NITROSTAT ) 0.4 MG SL tablet Place 1 tablet (0.4 mg total) under the tongue every 5 (five) minutes as needed for chest pain. 25 tablet 0   ondansetron  (ZOFRAN ) 8 MG tablet Take 1 tablet (8 mg total) by mouth every 8 (eight) hours as needed for nausea or vomiting. 30 tablet 2   polyethylene glycol powder (GLYCOLAX /MIRALAX ) 17 GM/SCOOP powder Take 17 g by mouth daily mixed in liquid as directed. 238 g 0   potassium chloride  SA (KLOR-CON  M) 20 MEQ tablet Take 1 tablet (20 mEq total) by  mouth daily. Do not crush or break-up 30 tablet 0   prochlorperazine  (COMPAZINE ) 10 MG tablet Take 1 tablet (10 mg total) by mouth every 6 (six) hours as needed for nausea or vomiting. 30 tablet 2   simethicone  (MYLICON) 80 MG chewable tablet Chew 1 tablet (80 mg total) by mouth 4 (four) times daily as needed for flatulence. 100 tablet 0   valsartan  (DIOVAN ) 40 MG tablet Take 1 tablet (40 mg total) by mouth daily. 90 tablet 2   No current facility-administered medications on file prior to visit.    Allergies: No Known Allergies Past Medical History:  Past Medical History:  Diagnosis Date   Abnormal uterine bleeding 04/10/2022   Anemia    Blood transfusion without reported diagnosis    Hypertension    Pre-diabetes 02/2022   Stomach cancer [facility])    Past Surgical History:  Past Surgical History:  Procedure Laterality Date   BREAST LUMPECTOMY WITH RADIOACTIVE SEED LOCALIZATION Left 06/10/2022   Procedure: LEFT BREAST LUMPECTOMY WITH RADIOACTIVE SEED LOCALIZATION;  Surgeon: Linda [name];  Location: [facility];  Service: General;  Laterality: Left;   CESAREAN SECTION     x4   COLON SURGERY     IR IMAGING GUIDED PORT INSERTION  07/08/2023   LAPAROTOMY N/A 06/30/2023   Procedure: EXPLORATORY LAPAROTOMY, SMALL BOWEL RESECTION, AND EXCISIONAL BIOPSIES OF MULTIPLE OMENTAL MASSES;  Surgeon: Linda [name];  Location: [facility];  Service: General;  Laterality: N/A;   TUBAL LIGATION     Social History:  Social History   Socioeconomic History   Marital status: Single    Spouse name: Not on file   Number of children: 4   Years of education: Not on file   Highest education level: Some college, no degree  Occupational History    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  Tobacco Use   Smoking status: Never   Smokeless tobacco: Never  Vaping Use   Vaping status: Never Used  Substance and Sexual Activity   Alcohol use: No   Drug use: No   Sexual  activity: Not Currently    Birth control/protection: Surgical  Other Topics Concern   Not on file  Social History Narrative   Not on file   Social Drivers of Health   Financial Resource Strain: Medium Risk (10/29/2023)   Overall Financial Resource Strain (CARDIA)    Difficulty of Paying Living Expenses: Somewhat hard  Food Insecurity: Patient Declined (10/29/2023)   Hunger Vital Sign    Worried About Running Out of Food in the Last Year: Patient declined    Ran Out of Food in the Last Year: Patient declined  Transportation Needs: No Transportation Needs (10/29/2023)   PRAPARE - Administrator, Civil Service (Medical): No    Lack of Transportation (Non-Medical): No  Physical Activity: Inactive (10/29/2023)   Exercise Vital Sign    Days of Exercise per Week: 0  days    Minutes of Exercise per Session: 0 min  Stress: No Stress Concern Present (10/29/2023)   [employer] of Occupational Health - Occupational Stress Questionnaire    Feeling of Stress : Only a little  Social Connections: Moderately Isolated (10/29/2023)   Social Connection and Isolation Panel    Frequency of Communication with Friends and Family: More than three times a week    Frequency of Social Gatherings with Friends and Family: More than three times a week    Attends Religious Services: 1 to 4 times per year    Active Member of [employer] or Organizations: No    Attends Engineer, structural: Not on file    Marital Status: Never married  Intimate Partner Violence: Not At Risk (06/20/2023)   Humiliation, Afraid, Rape, and Kick questionnaire    Fear of Current or Ex-Partner: No    Emotionally Abused: No    Physically Abused: No    Sexually Abused: No   Family History:  Family History  Problem Relation Age of Onset   Memory loss Mother    Hypertension Father    Hypertension Brother    Diabetes Maternal Grandmother    Hypertension Maternal Grandmother    Hypertension Maternal Grandfather    Colon  cancer Neg Hx    Stomach cancer Neg Hx    Esophageal cancer Neg Hx     Review of Systems: Constitutional: Doesn't report fevers, chills or abnormal weight loss Eyes: Doesn't report blurriness of vision Ears, nose, mouth, throat, and face: Doesn't report sore throat Respiratory: Doesn't report cough, dyspnea or wheezes Cardiovascular: Doesn't report palpitation, chest discomfort  Gastrointestinal:  Doesn't report nausea, constipation, diarrhea GU: Doesn't report incontinence Skin: Doesn't report skin rashes Neurological: Per HPI Musculoskeletal: Doesn't report joint pain Behavioral/Psych: Doesn't report anxiety  Physical Exam: Vitals:   05/11/24 1235 05/11/24 1236  BP: (!) 142/67 (!) 144/68  Pulse: 60   Resp: 17   Temp: 97.6 F (36.4 C)   SpO2: 100%     KPS: 90. General: Alert, cooperative, pleasant, in no acute distress Head: Normal EENT: No conjunctival injection or scleral icterus.  Lungs: Resp effort normal Cardiac: Regular rate Abdomen: Non-distended abdomen Skin: No rashes cyanosis or petechiae. Extremities: No clubbing or edema  Neurologic Exam: Mental Status: Awake, alert, attentive to examiner. Oriented to self and environment. Language is fluent with intact comprehension.  Cranial Nerves: Visual acuity is grossly normal. Visual fields are full. Extra-ocular movements intact. No ptosis. Face is symmetric Motor: Tone and bulk are normal. Power is full in both arms and legs. Reflexes are symmetric, no pathologic reflexes present.  Sensory: Intact to light touch Gait: Normal.   Labs: I have reviewed the data as listed    Component Value Date/Time   NA 138 05/05/2024 1315   NA 141 05/16/2022 1044   K 3.4 (L) 05/05/2024 1315   CL 102 05/05/2024 1315   CO2 28 05/05/2024 1315   GLUCOSE 130 (H) 05/05/2024 1315   BUN 9 05/05/2024 1315   BUN 9 05/16/2022 1044   CREATININE 0.76 05/05/2024 1315   CALCIUM  9.5 05/05/2024 1315   PROT 8.7 (H) 05/05/2024 1315    PROT 7.8 05/16/2022 1044   ALBUMIN  4.0 05/05/2024 1315   ALBUMIN  3.5 (L) 08/03/2023 1515   AST 16 05/05/2024 1315   ALT 10 05/05/2024 1315   ALKPHOS 99 05/05/2024 1315   BILITOT 0.5 05/05/2024 1315   GFRNONAA >60 05/05/2024 1315   Lab Results  Component  Value Date   WBC 9.0 05/05/2024   NEUTROABS 6.1 05/05/2024   HGB 13.5 05/05/2024   HCT 40.0 05/05/2024   MCV 91.1 05/05/2024   PLT 338 05/05/2024    Assessment/Plan Seizures (HCC)  Rojelio DELENA Norse presents with clinical syndrome c/w focal seizures.  Etiology of seizures is unclear, family history is noteworthy.  Given close association with 5-Fu infusion, now discontinued, prolonged seizure freedom, it would be reasonable to discontinue Vimpat  at this time.  Patient would prefer stopping over continuing to dose, given the option.  She understands there will be increased risk of breakthrough seizure off the medication.    We appreciate the opportunity to participate in the care of [employer].  We ask that Rojelio DELENA Norse return to clinic only as needed.   All questions were answered. The patient knows to call the clinic with any problems, questions or concerns. No barriers to learning were detected.  The total time spent in the encounter was 30 minutes and more than 50% was on counseling and review of test results   Linda MARLA Manns, [name] Medical Director of Neuro-Oncology [facility] at [facility] 05/11/24 12:34 PM

## 2024-05-12 ENCOUNTER — Telehealth: Payer: Self-pay | Admitting: Hematology

## 2024-05-12 ENCOUNTER — Other Ambulatory Visit: Payer: Self-pay

## 2024-05-12 LAB — LIPID PANEL
Chol/HDL Ratio: 4.6 ratio — ABNORMAL HIGH (ref 0.0–4.4)
Cholesterol, Total: 244 mg/dL — ABNORMAL HIGH (ref 100–199)
HDL: 53 mg/dL (ref >39)
LDL Chol Calc (NIH): 173 mg/dL — ABNORMAL HIGH (ref 0–99)
Triglycerides: 103 mg/dL (ref 0–149)
VLDL Cholesterol Cal: 18 mg/dL (ref 5–40)

## 2024-05-12 LAB — HEMOGLOBIN A1C
Est. average glucose Bld gHb Est-mCnc: 137 mg/dL
Hgb A1c MFr Bld: 6.4 % — ABNORMAL HIGH (ref 4.8–5.6)

## 2024-05-12 NOTE — Telephone Encounter (Signed)
 Scheduled appointments per WQ. Talked with the patient and she is aware of the made appointments.

## 2024-05-13 ENCOUNTER — Ambulatory Visit: Payer: Self-pay | Admitting: Nurse Practitioner

## 2024-05-13 DIAGNOSIS — E782 Mixed hyperlipidemia: Secondary | ICD-10-CM

## 2024-05-13 DIAGNOSIS — E1169 Type 2 diabetes mellitus with other specified complication: Secondary | ICD-10-CM

## 2024-05-13 MED ORDER — ATORVASTATIN CALCIUM 10 MG PO TABS
10.0000 mg | ORAL_TABLET | Freq: Every day | ORAL | 2 refills | Status: DC
  Filled 2024-05-13: qty 30, 30d supply, fill #0

## 2024-05-14 ENCOUNTER — Other Ambulatory Visit (HOSPITAL_COMMUNITY): Payer: Self-pay

## 2024-05-18 ENCOUNTER — Encounter: Payer: Self-pay | Admitting: Hematology

## 2024-05-19 ENCOUNTER — Encounter: Payer: Self-pay | Admitting: Nurse Practitioner

## 2024-05-19 ENCOUNTER — Inpatient Hospital Stay

## 2024-05-19 ENCOUNTER — Encounter: Payer: Self-pay | Admitting: Hematology

## 2024-05-19 ENCOUNTER — Inpatient Hospital Stay (HOSPITAL_BASED_OUTPATIENT_CLINIC_OR_DEPARTMENT_OTHER): Admitting: Nurse Practitioner

## 2024-05-19 VITALS — BP 138/88 | HR 72 | Temp 98.0°F | Resp 17 | Wt 191.7 lb

## 2024-05-19 DIAGNOSIS — C172 Malignant neoplasm of ileum: Secondary | ICD-10-CM

## 2024-05-19 DIAGNOSIS — Z95828 Presence of other vascular implants and grafts: Secondary | ICD-10-CM

## 2024-05-19 DIAGNOSIS — Z5111 Encounter for antineoplastic chemotherapy: Secondary | ICD-10-CM | POA: Diagnosis not present

## 2024-05-19 LAB — CBC WITH DIFFERENTIAL (CANCER CENTER ONLY)
Abs Immature Granulocytes: 0.03 K/uL (ref 0.00–0.07)
Basophils Absolute: 0 K/uL (ref 0.0–0.1)
Basophils Relative: 0 %
Eosinophils Absolute: 0.1 K/uL (ref 0.0–0.5)
Eosinophils Relative: 1 %
HCT: 34.8 % — ABNORMAL LOW (ref 36.0–46.0)
Hemoglobin: 11.8 g/dL — ABNORMAL LOW (ref 12.0–15.0)
Immature Granulocytes: 0 %
Lymphocytes Relative: 21 %
Lymphs Abs: 2.3 K/uL (ref 0.7–4.0)
MCH: 30.1 pg (ref 26.0–34.0)
MCHC: 33.9 g/dL (ref 30.0–36.0)
MCV: 88.8 fL (ref 80.0–100.0)
Monocytes Absolute: 0.7 K/uL (ref 0.1–1.0)
Monocytes Relative: 6 %
Neutro Abs: 7.6 K/uL (ref 1.7–7.7)
Neutrophils Relative %: 72 %
Platelet Count: 422 K/uL — ABNORMAL HIGH (ref 150–400)
RBC: 3.92 MIL/uL (ref 3.87–5.11)
RDW: 13.7 % (ref 11.5–15.5)
Smear Review: NORMAL
WBC Count: 10.6 K/uL — ABNORMAL HIGH (ref 4.0–10.5)
nRBC: 0 % (ref 0.0–0.2)

## 2024-05-19 LAB — CMP (CANCER CENTER ONLY)
ALT: 18 U/L (ref 0–44)
AST: 22 U/L (ref 15–41)
Albumin: 3.6 g/dL (ref 3.5–5.0)
Alkaline Phosphatase: 151 U/L — ABNORMAL HIGH (ref 38–126)
Anion gap: 9 (ref 5–15)
BUN: 6 mg/dL (ref 6–20)
CO2: 27 mmol/L (ref 22–32)
Calcium: 9.1 mg/dL (ref 8.9–10.3)
Chloride: 101 mmol/L (ref 98–111)
Creatinine: 0.71 mg/dL (ref 0.44–1.00)
GFR, Estimated: >60 mL/min (ref >60)
Glucose, Bld: 141 mg/dL — ABNORMAL HIGH (ref 70–99)
Potassium: 3.1 mmol/L — ABNORMAL LOW (ref 3.5–5.1)
Sodium: 137 mmol/L (ref 135–145)
Total Bilirubin: 0.5 mg/dL (ref 0.0–1.2)
Total Protein: 8.4 g/dL — ABNORMAL HIGH (ref 6.5–8.1)

## 2024-05-19 LAB — HEPATIC FUNCTION PANEL
ALT: 21 U/L (ref 0–44)
AST: 27 U/L (ref 15–41)
Albumin: 2.9 g/dL — ABNORMAL LOW (ref 3.5–5.0)
Alkaline Phosphatase: 154 U/L — ABNORMAL HIGH (ref 38–126)
Bilirubin, Direct: 0.2 mg/dL (ref 0.0–0.2)
Indirect Bilirubin: 0.4 mg/dL (ref 0.3–0.9)
Total Bilirubin: 0.6 mg/dL (ref 0.0–1.2)
Total Protein: 7.8 g/dL (ref 6.5–8.1)

## 2024-05-19 MED ORDER — POTASSIUM CHLORIDE 10 MEQ/100ML IV SOLN
10.0000 meq | Freq: Once | INTRAVENOUS | Status: AC
  Administered 2024-05-19: 10 meq via INTRAVENOUS
  Filled 2024-05-19: qty 100

## 2024-05-19 MED ORDER — SODIUM CHLORIDE 0.9 % IV SOLN
180.0000 mg/m2 | Freq: Once | INTRAVENOUS | Status: AC
  Administered 2024-05-19: 400 mg via INTRAVENOUS
  Filled 2024-05-19: qty 15

## 2024-05-19 MED ORDER — SODIUM CHLORIDE 0.9 % IV SOLN: INTRAVENOUS | Status: DC

## 2024-05-19 MED ORDER — DEXAMETHASONE SODIUM PHOSPHATE 10 MG/ML IJ SOLN
10.0000 mg | Freq: Once | INTRAMUSCULAR | Status: AC
  Administered 2024-05-19: 10 mg via INTRAVENOUS
  Filled 2024-05-19: qty 1

## 2024-05-19 MED ORDER — ATROPINE SULFATE 1 MG/ML IV SOLN
0.5000 mg | Freq: Once | INTRAVENOUS | Status: DC | PRN
  Filled 2024-05-19: qty 1

## 2024-05-19 MED ORDER — SODIUM CHLORIDE 0.9% FLUSH
10.0000 mL | Freq: Once | INTRAVENOUS | Status: AC
Start: 2024-05-19 — End: 2024-05-19
  Administered 2024-05-19: 10 mL

## 2024-05-19 MED ORDER — PALONOSETRON HCL INJECTION 0.25 MG/5ML
0.2500 mg | Freq: Once | INTRAVENOUS | Status: AC
  Administered 2024-05-19: 0.25 mg via INTRAVENOUS
  Filled 2024-05-19: qty 5

## 2024-05-19 MED ORDER — SODIUM CHLORIDE 0.9% FLUSH
10.0000 mL | INTRAVENOUS | Status: DC | PRN
  Administered 2024-05-19: 10 mL

## 2024-05-19 MED ORDER — HEPARIN SOD (PORK) LOCK FLUSH 100 UNIT/ML IV SOLN
500.0000 [IU] | Freq: Once | INTRAVENOUS | Status: AC | PRN
  Administered 2024-05-19: 500 [IU]

## 2024-05-19 NOTE — Assessment & Plan Note (Signed)
 Hgba1c was increased at last visit, she continues with metformin  and tolerating well. Will recheck A1c today.

## 2024-05-19 NOTE — Assessment & Plan Note (Addendum)
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  [name] in the liver MRI also showed a 1.8 cm oligo met in segment 7 of liver. -We discussed the incurable nature of her metastatic disease, due to her metastasis in peritoneum and liver.  We also discussed the small possibility of HIPEC and liver directed therapy if she has excellent response to chemotherapy. -First-line chemotherapy FOLFOX was recommended.  She started on July 31, 2023, oxaliplatin  was held for first cycle due to open wound. -She developed seizure activity after first cycle of 5-FU infusion. She was evaluated by neuro oncologist Dr. Buckley, her seizure was felt to be unlikely related to 5-FU. She felt that seizure was coming after cycle 2 chemo when she coming in for pump d/c and required ativan . Her chemo was changed to CAPOX on 08/28/2023. She required dose reduction  -restaging CT 11/11/2023 showed mixed response with improved liver met and slightly worse peritoneal mets, Her Xeloda  dose was slightly increased. -restaging CT 02/04/2024 showed stable disease  -unfortunately scan 7/2/205 showed worsening peritoneal metastasis -Plan to change chemotherapy to irinotecan  and Xeloda  --05/19/2024 -she presents for cycle 2 day 1 irinotecan .  Overall, has tolerated well with manageable diarrhea and appetite decrease.  We discussed use of OTC Imodium to control diarrhea, intestinal cramps, and bloating.  Continue with aggressive oral hydration.  Increase protein and caloric intake with use of protein meal supplements as needed.  Due to good tolerance in general, increase topotecan to 180 mg/m.  Continue capecitabine , 2 tablets in the morning and 3 tablets in the evening.  She takes for 7 days and then has 7-day break.  Today, she begins 7 days  with medication.

## 2024-05-19 NOTE — Patient Instructions (Signed)
 [facility] - A DEPT OF [facility]  Discharge Instructions: Thank you for choosing [facility] to provide your oncology and hematology care.   If you have a lab appointment with the Cancer Center, please go directly to the Cancer Center and check in at the registration area.   Wear comfortable clothing and clothing appropriate for easy access to any Portacath or PICC line.   We strive to give you quality time with your provider. You may need to reschedule your appointment if you arrive late (15 or more minutes).  Arriving late affects you and other patients whose appointments are after yours.  Also, if you miss three or more appointments without notifying the office, you may be dismissed from the clinic at the provider's discretion.      For prescription refill requests, have your pharmacy contact our office and allow 72 hours for refills to be completed.    Today you received the following chemotherapy and/or immunotherapy agents: Irinotecan       To help prevent nausea and vomiting after your treatment, we encourage you to take your nausea medication as directed.  BELOW ARE SYMPTOMS THAT SHOULD BE REPORTED IMMEDIATELY: *FEVER GREATER THAN 100.4 F (38 C) OR HIGHER *CHILLS OR SWEATING *NAUSEA AND VOMITING THAT IS NOT CONTROLLED WITH YOUR NAUSEA MEDICATION *UNUSUAL SHORTNESS OF BREATH *UNUSUAL BRUISING OR BLEEDING *URINARY PROBLEMS (pain or burning when urinating, or frequent urination) *BOWEL PROBLEMS (unusual diarrhea, constipation, pain near the anus) TENDERNESS IN MOUTH AND THROAT WITH OR WITHOUT PRESENCE OF ULCERS (sore throat, sores in mouth, or a toothache) UNUSUAL RASH, SWELLING OR PAIN  UNUSUAL VAGINAL DISCHARGE OR ITCHING   Items with * indicate a potential emergency and should be followed up as soon as possible or go to the Emergency Department if any problems should occur.  Please show the CHEMOTHERAPY ALERT CARD or IMMUNOTHERAPY  ALERT CARD at check-in to the Emergency Department and triage nurse.  Should you have questions after your visit or need to cancel or reschedule your appointment, please contact [facility] - [facility] DE[facility]  Dept: [phone]  and follow the prompts.  Office hours are 8:00 a.m. to 4:30 p.m. Monday - Friday. Please note that voicemails left after 4:00 p.m. may not be returned until the following business day.  We are closed weekends and major holidays. You have access to a nurse at all times for urgent questions. Please call the main number to the clinic Dept: [phone] and follow the prompts.   For any non-urgent questions, you may also contact your provider using MyChart. We now offer e-Visits for anyone 71 and older to request care online for non-urgent symptoms. For details visit [email or web address].   Also download the MyChart app! Go to the app store, search MyChart, open the app, select [facility] [place], and log in with your MyChart username and password.  Hypokalemia Hypokalemia means that the amount of potassium in the blood is lower than normal. Potassium is a mineral (electrolyte) that helps regulate the amount of fluid in the body. It also stimulates muscle tightening (contraction) and helps nerves work properly. Normally, most of the body's potassium is inside cells, and only a very small amount is in the blood. Because the amount in the blood is so small, minor changes to potassium levels in the blood can be life-threatening. What are the causes? This condition may be caused by: Antibiotic medicine.  Diarrhea or vomiting. Taking too much of a medicine that helps you have a bowel movement (laxative) can cause diarrhea and lead to hypokalemia. Chronic kidney disease (CKD). Medicines that help the body get rid of excess fluid (diuretics). Eating disorders, such as anorexia or bulimia. Low magnesium levels in the body. Sweating a lot. What are  the signs or symptoms? Symptoms of this condition include: Weakness. Constipation. Fatigue. Muscle cramps. Mental confusion. Skipped heartbeats or irregular heartbeat (palpitations). Tingling or numbness. How is this diagnosed? This condition is diagnosed with a blood test. How is this treated? This condition may be treated by: Taking potassium supplements. Adjusting the medicines that you take. Eating more foods that contain a lot of potassium. If your potassium level is very low, you may need to get potassium through an IV and be monitored in the hospital. Follow these instructions at home: Eating and drinking  Eat a healthy diet. A healthy diet includes fresh fruits and vegetables, whole grains, healthy fats, and lean proteins. If told, eat more foods that contain a lot of potassium. These include: Nuts, such as peanuts and pistachios. Seeds, such as sunflower seeds and pumpkin seeds. Peas, lentils, and lima beans. Whole grain and bran cereals and breads. Fresh fruits and vegetables, such as apricots, avocado, bananas, cantaloupe, kiwi, oranges, tomatoes, asparagus, and potatoes. Juices, such as orange, tomato, and prune. Lean meats, including fish. Milk and milk products, such as yogurt. General instructions Take over-the-counter and prescription medicines only as told by your health care provider. This includes vitamins, natural food products, and supplements. Keep all follow-up visits. This is important. Contact a health care provider if: You have weakness that gets worse. You feel your heart pounding or racing. You vomit. You have diarrhea. You have diabetes and you have trouble keeping your blood sugar in your target range. Get help right away if: You have chest pain. You have shortness of breath. You have vomiting or diarrhea that lasts for more than 2 days. You faint. These symptoms may be an emergency. Get help right away. Call 911. Do not wait to see if the  symptoms will go away. Do not drive yourself to the hospital. Summary Hypokalemia means that the amount of potassium in the blood is lower than normal. This condition is diagnosed with a blood test. Hypokalemia may be treated by taking potassium supplements, adjusting the medicines that you take, or eating more foods that are high in potassium. If your potassium level is very low, you may need to get potassium through an IV and be monitored in the hospital. This information is not intended to replace advice given to you by your health care provider. Make sure you discuss any questions you have with your health care provider. Document Revised: 06/21/2021 Document Reviewed: 06/21/2021 Elsevier Patient Education  2024 [employer].

## 2024-05-19 NOTE — Progress Notes (Signed)
 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  [name] in the liver MRI also showed a 1.8 cm oligo met in segment 7 of liver. -We discussed the incurable nature of her metastatic disease, due to her metastasis in peritoneum and liver.  We also discussed the small possibility of HIPEC and liver directed therapy if she has excellent response to chemotherapy. -First-line chemotherapy FOLFOX was recommended.  She started on July 31, 2023, oxaliplatin  was held for first cycle due to open wound. -She developed seizure activity after first cycle of 5-FU infusion. She was evaluated by neuro oncologist Dr. Buckley, her seizure was felt to be unlikely related to 5-FU. She felt that seizure was coming after cycle 2 chemo when she coming in for pump d/c and required ativan . Her chemo was changed to CAPOX on 08/28/2023. She required dose reduction  -restaging CT 11/11/2023 showed mixed response with improved liver met and slightly worse peritoneal mets, Her Xeloda  dose was slightly increased. -restaging CT 02/04/2024 showed stable disease  -unfortunately scan 7/2/205 showed worsening peritoneal metastasis -Plan to change chemotherapy to irinotecan  and Xeloda  --05/19/2024 -she presents for cycle 2 day 1 irinotecan .  Overall, has tolerated well with manageable diarrhea and appetite decrease.  We discussed use of OTC  Imodium to control diarrhea, intestinal cramps, and bloating.  Continue with aggressive oral hydration.  Increase protein and caloric intake with use of protein meal supplements as needed.  Due to good tolerance in general, increase topotecan to 180 mg/m.  Continue capecitabine , 2 tablets in the morning and 3 tablets in the evening.  She takes for 7 days and then has 7-day break.  Today, she begins 7 days with medication.    Diarrhea Patient states she has had moderate diarrhea since starting new chemotherapy, irinotecan , along with Xeloda .  She states she does get stomach cramping and bloating along with diarrhea.  Has not tried OTC Imodium yet.  Recommended she try OTC Imodium.  She has had problems with constipation in the past, so advised her to start with 1 tablet of Imodium with onset of diarrhea, taking additional tablets with every other loose bowel movement.  Encouraged her to continue with aggressive oral hydration with water, Gatorade, or other electrolyte fluid replacement.  Hypokalemia Likely secondary to diarrhea from chemotherapy.  Potassium is 3.1 today.  Will treat with KCl 10 mEq during chemotherapy infusion.  Recheck in 2 weeks and treat as indicated.  Mild anemia, mildly elevated platelets and WBC Mild anemia with Hgb 11.8 and HCT 34.8.  Mild leukocytosis with WBC 10.6.  ANC is pending.  Platelets slightly elevated at 422.  Abnormal results likely due to  new chemotherapy treatment irinotecan  along with capecitabine .  Will continue to monitor labs closely with each office visit.  Treat iron deficiency as indicated.  Weight loss Patient admits to decreased appetite with new chemotherapy irinotecan  along with capecitabine .  She has had a 5 pound weight loss over the past week.  Recommend protein meal supplements as tolerated in order to keep up with protein and caloric intake.  Patient with good energy, no fever, no chills, and no night sweats.  Will continue to monitor negative side  effects.  Plan Labs reviewed. - CBC showing mild anemia with Hgb 11.8 and HCT 34.8.  Mild elevation in WBC at 10.6, ANC pending.  Mildly elevated platelets of 422. -CMP showing K of 3.1.  Will replace with 10 mEq potassium during infusion with irinotecan .  Remainder of CMP is unremarkable. - Hepatic functions are pending. Patient having mild, manageable side effects from new chemotherapy irinotecan  added to capecitabine .  Overall, she reports feeling well, with improvement on week 2. Labs and patient presentation are appropriate for treatment today.  Proceed with cycle 2 day 1 irinotecan  with increased dose of 180 mg/m. Continue oral capecitabine , 2 tablets in a.m. and 3 tablets in p.m., for 7 days x 7 days off.  Repeat every 2 weeks. Labs/labs, follow-up, and cycle 3 day 1 irinotecan  as scheduled.  The patient understands the plans discussed today and is in agreement with them.  She knows to contact our office if she develops concerns prior to her next appointment.  I provided 20 minutes of face-to-face time during this encounter and > 50% was spent counseling as documented under my assessment and plan.    Powell FORBES Lessen, NP  [facility] - A DEPT OF [facility] [address] [place] [address] Dept: [phone] Dept Fax: [phone]   No orders of the defined types were placed in this encounter.     CHIEF COMPLAINT:  CC: cancer of ileum with carcinomatosis  Current Treatment:  irinotecan  and capecitabine   INTERVAL HISTORY:  Linda [name] is here today for repeat clinical assessment. She was last seen by Dr. Lanny on 04/28/2024. Treatment recently changed to irinotecan  every 14 days and Capecitabine  for 7 days on and 7 days off. Today, she presents for Cycle 2 irinotecan . Cycle 1 started with irinotecan  at reduced dose of 160 mg/M2 if tolerated well, dose to be increased to 180 milligrams per meter squared.  In  general, she reports tolerating well.  Has had some diarrhea.  This is accompanied with cramping and bloating.  She has not tried Imodium yet.  She is hydrating aggressively.  She does report decreased appetite since starting new treatment.  She states she does have baseline neuropathy, but this has not been worse since starting new treatment.  She denies chest pain, chest pressure, or shortness of breath. She denies headaches or visual disturbances. She denies nausea or vomiting. She denies fevers or chills.  She denies night sweats.  She denies pain. Her weight has decreased 5 pounds over last week.  I have reviewed the past medical history, past surgical history, social history and family history with the patient and they are unchanged from previous note.  ALLERGIES:  has no known allergies.  MEDICATIONS:  Current Outpatient Medications  Medication Sig Dispense Refill   acetaminophen  (TYLENOL ) 500 MG tablet Take 2 tablets (1,000 mg total) by mouth every 8 (eight) hours as needed for mild pain or fever.     atorvastatin  (LIPITOR) 10 MG tablet Take 1 tablet (10 mg total) by mouth daily. 30 tablet 2   capecitabine  (XELODA ) 500 MG tablet TAKE  2 tabs in morning and 3 tabs in evening, every 10-12 hours. Take within 30 minutes after meal. Take for 7 days  on, then 7 days off, repeat every 14 days. 70 tablet 1   diclofenac  Sodium (VOLTAREN ) 1 % GEL Research Patient: Apply 0.5 grams (1 fingertip) to each hand and each foot twice daily for up to 12 weeks 400 g 0   Lacosamide  100 MG TABS Take 1 tablet (100 mg total) by mouth 2 (two) times daily. 60 tablet 3   LORazepam  (ATIVAN ) 1 MG tablet Take 1 tablet (1 mg total) by mouth every 6 (six) hours as needed for anxiety. 30 tablet 0   methocarbamol  (ROBAXIN ) 500 MG tablet Take 2 tablets (1,000 mg total) by mouth every 8 (eight) hours as needed for muscle spasms. 30 tablet 0   nitroGLYCERIN  (NITROSTAT ) 0.4 MG SL tablet Place 1 tablet (0.4 mg total) under the  tongue every 5 (five) minutes as needed for chest pain. 25 tablet 0   ondansetron  (ZOFRAN ) 8 MG tablet Take 1 tablet (8 mg total) by mouth every 8 (eight) hours as needed for nausea or vomiting. 30 tablet 2   polyethylene glycol powder (GLYCOLAX /MIRALAX ) 17 GM/SCOOP powder Take 17 g by mouth daily mixed in liquid as directed. 238 g 0   potassium chloride  SA (KLOR-CON  M) 20 MEQ tablet Take 1 tablet (20 mEq total) by mouth daily. Do not crush or break-up 30 tablet 0   prochlorperazine  (COMPAZINE ) 10 MG tablet Take 1 tablet (10 mg total) by mouth every 6 (six) hours as needed for nausea or vomiting. 30 tablet 2   simethicone  (MYLICON) 80 MG chewable tablet Chew 1 tablet (80 mg total) by mouth 4 (four) times daily as needed for flatulence. 100 tablet 0   traMADol  (ULTRAM ) 50 MG tablet Take 1 tablet (50 mg total) by mouth every 6 (six) hours as needed. 60 tablet 0   valsartan  (DIOVAN ) 40 MG tablet Take 1 tablet (40 mg total) by mouth daily. 90 tablet 2   No current facility-administered medications for this visit.   Facility-Administered Medications Ordered in Other Visits  Medication Dose Route Frequency Provider Last Rate Last Admin   0.9 %  sodium chloride  infusion   Intravenous Continuous Lanny Callander, MD 10 mL/hr at 05/19/24 1349 New Bag at 05/19/24 1349   atropine  injection 0.5 mg  0.5 mg Intravenous Once PRN Lanny Callander, MD       heparin  lock flush 100 unit/mL  500 Units Intracatheter Once PRN Lanny Callander, MD       irinotecan  (CAMPTOSAR ) 400 mg in sodium chloride  0.9 % 500 mL chemo infusion  180 mg/m2 (Treatment Plan Recorded) Intravenous Once Lanny Callander, MD       potassium chloride  10 mEq in 100 mL IVPB  10 mEq Intravenous Once Tamyah Cutbirth E, NP 100 mL/hr at 05/19/24 1359 10 mEq at 05/19/24 1359   sodium chloride  flush (NS) 0.9 % injection 10 mL  10 mL Intracatheter PRN Lanny Callander, MD        HISTORY OF PRESENT ILLNESS:   Oncology History  Cancer of ileum with carcinomatosis  07/10/2023 Initial  Diagnosis   Cancer of ileum with carcinomatosis   07/22/2023 Cancer Staging   Staging form: Small Intestine - Adenocarcinoma, AJCC 8th Edition - Pathologic: Stage IV (pT4, pNX, pM1) - Signed by Lanny Callander, MD on 07/22/2023 Histologic grade (G): G2 Histologic grading system: 4 grade system   07/31/2023 - 08/16/2023 Chemotherapy   Patient is on Treatment Plan : COLORECTAL FOLFOX q14d     08/28/2023 - 03/24/2024 Chemotherapy   Patient is on Treatment Plan : COLORECTAL  CAPEOX (130/850) q21d x 8 cycles     05/05/2024 -  Chemotherapy   Patient is on Treatment Plan : COLORECTAL  Irinotecan  (200) D1 + Capecitabine  D1-7 (XELIRI) q14 Days         REVIEW OF SYSTEMS:   Constitutional: Denies fevers, chills or night sweats.  She has had decreased appetite and thus, weight loss of 5 pounds. Eyes: Denies blurriness of vision Ears, nose, mouth, throat, and face: Denies mucositis or sore throat Respiratory: Denies cough, dyspnea or wheezes Cardiovascular: Denies palpitation, chest discomfort or lower extremity swelling Gastrointestinal:  Denies nausea, vomiting, or heartburn.  She has had increased gas, bloating, and diarrhea. Skin: Denies abnormal skin rashes Lymphatics: Denies new lymphadenopathy or easy bruising Neurological:Denies numbness, tingling or new weaknesses Behavioral/Psych: Mood is stable, no new changes  All other systems were reviewed with the patient and are negative.   VITALS:   Today's Vitals   05/19/24 1300 05/19/24 1306  BP:  138/88  Pulse:  72  Resp:  17  Temp:  98 F (36.7 C)  SpO2:  98%  Weight:  191 lb 11.2 oz (87 kg)  PainSc: 0-No pain    Body mass index is 32.91 kg/m.   Wt Readings from Last 3 Encounters:  05/19/24 191 lb 11.2 oz (87 kg)  05/11/24 193 lb 6 oz (87.7 kg)  05/11/24 195 lb 3.2 oz (88.5 kg)    Body mass index is 32.91 kg/m.  Performance status (ECOG): 1 - Symptomatic but completely ambulatory  PHYSICAL EXAM:   GENERAL:alert, no distress  and comfortable SKIN: skin color, texture, turgor are normal, no rashes or significant lesions EYES: normal, Conjunctiva are pink and non-injected, sclera clear OROPHARYNX:no exudate, no erythema and lips, buccal mucosa, and tongue normal  NECK: supple, thyroid  normal size, non-tender, without nodularity LYMPH:  no palpable lymphadenopathy in the cervical, axillary or inguinal LUNGS: clear to auscultation and percussion with normal breathing effort HEART: regular rate & rhythm and no murmurs and no lower extremity edema ABDOMEN:abdomen soft, non-tender and normal bowel sounds Musculoskeletal:no cyanosis of digits and no clubbing  NEURO: alert & oriented x 3 with fluent speech, no focal motor/sensory deficits  LABORATORY DATA:  I have reviewed the data as listed    Component Value Date/Time   NA 137 05/19/2024 1233   NA 141 05/16/2022 1044   K 3.1 (L) 05/19/2024 1233   CL 101 05/19/2024 1233   CO2 27 05/19/2024 1233   GLUCOSE 141 (H) 05/19/2024 1233   BUN 6 05/19/2024 1233   BUN 9 05/16/2022 1044   CREATININE 0.71 05/19/2024 1233   CALCIUM  9.1 05/19/2024 1233   PROT 8.4 (H) 05/19/2024 1233   PROT 7.8 05/16/2022 1044   ALBUMIN  3.6 05/19/2024 1233   ALBUMIN  3.5 (L) 08/03/2023 1515   AST 22 05/19/2024 1233   ALT 18 05/19/2024 1233   ALKPHOS 151 (H) 05/19/2024 1233   BILITOT 0.5 05/19/2024 1233   GFRNONAA >60 05/19/2024 1233    Lab Results  Component Value Date   WBC 10.6 (H) 05/19/2024   NEUTROABS 7.6 05/19/2024   HGB 11.8 (L) 05/19/2024   HCT 34.8 (L) 05/19/2024   MCV 88.8 05/19/2024   PLT 422 (H) 05/19/2024      RADIOGRAPHIC STUDIES: CT CHEST ABDOMEN PELVIS W CONTRAST Result Date: 04/21/2024 CLINICAL DATA:  Colon cancer, assess treatment response. * Tracking Code: BO *. EXAM: CT CHEST, ABDOMEN, AND PELVIS WITH CONTRAST TECHNIQUE: Multidetector CT imaging of the chest, abdomen and pelvis was  performed following the standard protocol during bolus administration of  intravenous contrast. RADIATION DOSE REDUCTION: This exam was performed according to the departmental dose-optimization program which includes automated exposure control, adjustment of the mA and/or kV according to patient size and/or use of iterative reconstruction technique. CONTRAST:  OMNIPAQUE  IOHEXOL  300 MG/ML  SOLN COMPARISON:  Multiple priors including CT February 04, 2024 FINDINGS: CT CHEST FINDINGS Cardiovascular: Accessed right chest Port-A-Cath with tip near the superior cavoatrial junction. Normal size heart. No significant pericardial effusion/thickening. Coronary artery calcifications. Mediastinum/Nodes: No suspicious thyroid  nodule. Prominent mediastinal and axillary lymph nodes not pathologically enlarged by size criteria are stable from prior examination. Lungs/Pleura: No new suspicious pulmonary nodules or masses. Stable 3 mm left lower lobe pulmonary nodule on image 74/6. Musculoskeletal: 7 mm subcutaneous cyst/nodule in the anterior lower chest wall on image 35/2 is stable from prior examinations likely a sebaceous cyst. No aggressive lytic or blastic lesion of bone. Dextroconvex curvature of the thoracic spine. CT ABDOMEN PELVIS FINDINGS Hepatobiliary: No suspicious hepatic lesion. Diffuse hepatic steatosis. Gallbladder is unremarkable. No biliary ductal dilation. Pancreas: No pancreatic ductal dilation or evidence of acute inflammation. Spleen: No splenomegaly. Adrenals/Urinary Tract: No suspicious adrenal nodule/mass. Right renal cortical scarring. Kidneys demonstrate symmetric enhancement. Urinary bladder is unremarkable for degree of distension. Stomach/Bowel: Stomach is unremarkable for degree of distension. No pathologic dilation of small or large bowel. Colonic diverticulosis. Vascular/Lymphatic: Normal caliber abdominal aorta. Smooth IVC contours. Prominent lymph nodes in the portacaval region measuring 11 mm in short axis on image 54/2, unchanged. Left along the central mesentery  near the third portion of the duodenum measures 3.2 x 2.1 cm on image 65/2 previously 3.2 x 2.3 cm. Reproductive: Progressive heterogeneous soft tissue and fluid along the right-sided the uterus measuring 6.7 x 4.6 cm on image 89/2. Similar heterogeneous fluid in the endometrial canal. Other: New cystic collection in the right hemipelvis measuring 4.9 cm with internal soft tissue nodule measuring 10 mm on image 95/2. Mixed cystic and solid collection in the rectouterine pouch measures 8.8 x 5.6 cm on image 95/2 previously 8.8 x 6.1 cm. Increased peritoneal and omental nodularity.  For reference: -left anterior abdomen measuring 2.1 x 1.9 cm on image 68/2 previously 12 mm. Right anterior pelvis along the uterus 3.5 x 2.7 cm on image 91/2 previously 2.6 x 1.7 cm. Trace pelvic free fluid. Musculoskeletal: No aggressive lytic or blastic lesion of bone. IMPRESSION: 1. CT findings compatible with worsening peritoneal carcinomatosis with progressive heterogeneous soft tissue and fluid along the right-sided the uterus, a new cystic collection in the right hemipelvis measuring 4.9 cm with internal soft tissue nodule measuring 10 mm and overall increased peritoneal and omental nodularity. 2. Stable mixed cystic and solid collection in the rectouterine pouch. 3. Stable prominent lymph nodes in the portacaval region and central mesentery. 4. Stable 3 mm left lower lobe pulmonary nodule. 5. Diffuse hepatic steatosis. 6. Colonic diverticulosis without findings of acute diverticulitis. Electronically Signed   By: Reyes Holder M.D.   On: 04/21/2024 15:26

## 2024-05-19 NOTE — Assessment & Plan Note (Signed)

## 2024-05-19 NOTE — Assessment & Plan Note (Signed)
 Declines shingrix, educated on disease process and is aware if he changes his mind to notify office

## 2024-05-19 NOTE — Assessment & Plan Note (Signed)
 She has been having vaginal spotting recently and was to contact Gyn if the symptoms worsened. Will contact Dr. Lanny to see what the next steps would be.

## 2024-05-19 NOTE — Assessment & Plan Note (Signed)
Continue follow-up with Oncology.

## 2024-05-20 ENCOUNTER — Other Ambulatory Visit: Payer: Self-pay | Admitting: Nurse Practitioner

## 2024-05-20 DIAGNOSIS — N939 Abnormal uterine and vaginal bleeding, unspecified: Secondary | ICD-10-CM

## 2024-05-21 ENCOUNTER — Telehealth: Payer: Self-pay

## 2024-05-21 NOTE — Telephone Encounter (Signed)
 Spoke with Linda [name] regarding her referral to GYN oncology. She has an appointment scheduled with Dr. Viktoria on 05/28/24 at 11:15. Patient agrees to date and time. She has been provided with office address and location. She is also aware of our mask and visitor policy. Patient verbalized understanding and will call with any questions.    Pt declined earlier appointment on 8/4 d/t transportation.

## 2024-05-24 ENCOUNTER — Other Ambulatory Visit (HOSPITAL_COMMUNITY): Payer: Self-pay

## 2024-05-26 ENCOUNTER — Encounter: Payer: Self-pay | Admitting: Gynecologic Oncology

## 2024-05-26 ENCOUNTER — Other Ambulatory Visit: Payer: Self-pay

## 2024-05-26 ENCOUNTER — Telehealth: Payer: Self-pay

## 2024-05-26 ENCOUNTER — Encounter: Payer: Self-pay | Admitting: Pharmacist

## 2024-05-26 NOTE — Telephone Encounter (Signed)
 Patient was seen by Dr Eldonna on 06/2023 and she is not a new patient.  Cancelled the new patient visit.SABRA waiting to hear from Dr Eldonna to see about Dr Viktoria seeing her on Thursday 8/7.SABRA

## 2024-05-26 NOTE — Progress Notes (Signed)
   05/26/2024  Patient ID: Linda [name], female   DOB: September 05, 1966, 58 y.o.   MRN: [id number]  Pharmacy Quality Measure Review  Statin Use in Persons with Diabetes (SUPD)  Patient's chart was reviewed because she has a diagnosis of diabetes and was not on a statin at the time of the [facility] Statin Report.  A note was sent to her PCP prior to her last appointment.  Atorvastatin  10 mg 1 tablet daily was started on 05/14/24. Verification of pick up was obtained from Dr. Annemarie.  Measure should be closed.  Linda [name], PharmD, BCACP Clinical Pharmacist [phone]

## 2024-05-27 ENCOUNTER — Encounter: Payer: Self-pay | Admitting: Hematology

## 2024-05-27 ENCOUNTER — Other Ambulatory Visit: Payer: Self-pay | Admitting: Nurse Practitioner

## 2024-05-27 ENCOUNTER — Inpatient Hospital Stay: Attending: Hematology | Admitting: Gynecologic Oncology

## 2024-05-27 ENCOUNTER — Inpatient Hospital Stay

## 2024-05-27 ENCOUNTER — Encounter: Payer: Self-pay | Admitting: Gynecologic Oncology

## 2024-05-27 ENCOUNTER — Inpatient Hospital Stay: Admitting: Gynecologic Oncology

## 2024-05-27 ENCOUNTER — Other Ambulatory Visit (HOSPITAL_COMMUNITY): Payer: Self-pay

## 2024-05-27 VITALS — BP 132/76 | HR 77 | Temp 98.1°F | Resp 20 | Wt 191.4 lb

## 2024-05-27 DIAGNOSIS — C787 Secondary malignant neoplasm of liver and intrahepatic bile duct: Secondary | ICD-10-CM | POA: Diagnosis present

## 2024-05-27 DIAGNOSIS — N939 Abnormal uterine and vaginal bleeding, unspecified: Secondary | ICD-10-CM

## 2024-05-27 DIAGNOSIS — R87810 Cervical high risk human papillomavirus (HPV) DNA test positive: Secondary | ICD-10-CM | POA: Insufficient documentation

## 2024-05-27 DIAGNOSIS — C172 Malignant neoplasm of ileum: Secondary | ICD-10-CM

## 2024-05-27 DIAGNOSIS — E669 Obesity, unspecified: Secondary | ICD-10-CM

## 2024-05-27 DIAGNOSIS — Z124 Encounter for screening for malignant neoplasm of cervix: Secondary | ICD-10-CM

## 2024-05-27 DIAGNOSIS — D500 Iron deficiency anemia secondary to blood loss (chronic): Secondary | ICD-10-CM

## 2024-05-27 DIAGNOSIS — N840 Polyp of corpus uteri: Secondary | ICD-10-CM | POA: Insufficient documentation

## 2024-05-27 DIAGNOSIS — Z9221 Personal history of antineoplastic chemotherapy: Secondary | ICD-10-CM | POA: Diagnosis not present

## 2024-05-27 DIAGNOSIS — Z5111 Encounter for antineoplastic chemotherapy: Secondary | ICD-10-CM | POA: Insufficient documentation

## 2024-05-27 DIAGNOSIS — E782 Mixed hyperlipidemia: Secondary | ICD-10-CM

## 2024-05-27 LAB — CBC WITH DIFFERENTIAL/PLATELET
Abs Immature Granulocytes: 0.02 K/uL (ref 0.00–0.07)
Basophils Absolute: 0 K/uL (ref 0.0–0.1)
Basophils Relative: 0 %
Eosinophils Absolute: 0.1 K/uL (ref 0.0–0.5)
Eosinophils Relative: 2 %
HCT: 36.5 % (ref 36.0–46.0)
Hemoglobin: 12.2 g/dL (ref 12.0–15.0)
Immature Granulocytes: 0 %
Lymphocytes Relative: 36 %
Lymphs Abs: 1.7 K/uL (ref 0.7–4.0)
MCH: 29.9 pg (ref 26.0–34.0)
MCHC: 33.4 g/dL (ref 30.0–36.0)
MCV: 89.5 fL (ref 80.0–100.0)
Monocytes Absolute: 0.4 K/uL (ref 0.1–1.0)
Monocytes Relative: 9 %
Neutro Abs: 2.5 K/uL (ref 1.7–7.7)
Neutrophils Relative %: 53 %
Platelets: 352 K/uL (ref 150–400)
RBC: 4.08 MIL/uL (ref 3.87–5.11)
RDW: 13.7 % (ref 11.5–15.5)
WBC: 4.8 K/uL (ref 4.0–10.5)
nRBC: 0 % (ref 0.0–0.2)

## 2024-05-27 MED ORDER — ATORVASTATIN CALCIUM 10 MG PO TABS
10.0000 mg | ORAL_TABLET | Freq: Every day | ORAL | 2 refills | Status: AC
  Filled 2024-05-27: qty 90, 90d supply, fill #0

## 2024-05-27 NOTE — Progress Notes (Signed)
 Gynecologic Oncology Return Clinic Visit  05/27/24  Reason for Visit: follow-up  Treatment History: Oncology History  Cancer of ileum with carcinomatosis  07/10/2023 Initial Diagnosis   Cancer of ileum with carcinomatosis   07/22/2023 Cancer Staging   Staging form: Small Intestine - Adenocarcinoma, AJCC 8th Edition - Pathologic: Stage IV (pT4, pNX, pM1) - Signed by Lanny Callander, MD on 07/22/2023 Histologic grade (G): G2 Histologic grading system: 4 grade system   07/31/2023 - 08/16/2023 Chemotherapy   Patient is on Treatment Plan : COLORECTAL FOLFOX q14d     08/28/2023 - 03/24/2024 Chemotherapy   Patient is on Treatment Plan : COLORECTAL CAPEOX (130/850) q21d x 8 cycles     05/05/2024 -  Chemotherapy   Patient is on Treatment Plan : COLORECTAL  Irinotecan  (200) D1 + Capecitabine  D1-7 (XELIRI) q14 Days      06/24/23: FNA peritoneal nodule Metastatic adenocaricnoma c/w colonic primary (CK20, CDX2 positive; CK7, TTF-1 PAX8, GATA3 negative)  06/24/23: EMB Benign endometrial polyp, benign inactive to weakly proliferative endometrial epithelium No hyperplasia, atypia, or malignancy  06/30/23: Exploratory laparotomy, distal small bowel resection with primary anastomosis, excisional biopsy of pelvic, multiple abdominal wall and omental masses  A. OMENTAL MASSES, EXCISION:  - Positive for adenocarcinoma  B. SOFT TISSUE, RIGHT LOWER QUADRANT MASS, EXCISION:  - Positive for adenocarcinoma  C. PORTION OF ILEUM, RESECTION:  - Adenocarcinoma, moderately differentiated, pT4  - No lymph nodes identified  - Margins negative for carcinoma  - See oncology table  D. ABDOMINAL WALL CYST, LEFT, EXCISION:  - Ovarian tissue involved by adenocarcinoma   Interval History: Overall doing well.  Tolerating new chemotherapy regimen well.  Notes appetite is a little bit decreased.  Energy level has been good.  Now having intermittent alternating constipation and diarrhea.  Has had some difficulty sleeping the  first couple of days after chemotherapy.  For the last 3 years or so, she endorses vaginal spotting every other month that last for about a week.  Most recently, she started having spotting last Friday, has had 1 episode today.  She describes this as seeing older appearing blood either when she wipes after urinating or in the toilet when she goes to the bathroom.  She denies having any on a pad or her underwear.  She denies any bleeding or spotting between these episodes.  She sometimes has some associated cramping, especially on the right side.  Past Medical/Surgical History: Past Medical History:  Diagnosis Date   Abnormal uterine bleeding 04/10/2022   Anemia    Blood transfusion without reported diagnosis    Hypertension    Pre-diabetes 02/2022   Stomach cancer [facility])     Past Surgical History:  Procedure Laterality Date   BREAST LUMPECTOMY WITH RADIOACTIVE SEED LOCALIZATION Left 06/10/2022   Procedure: LEFT BREAST LUMPECTOMY WITH RADIOACTIVE SEED LOCALIZATION;  Surgeon: Curvin Deward MOULD, MD;  Location: [facility];  Service: General;  Laterality: Left;   CESAREAN SECTION     x4   COLON SURGERY     IR IMAGING GUIDED PORT INSERTION  07/08/2023   LAPAROTOMY N/A 06/30/2023   Procedure: EXPLORATORY LAPAROTOMY, SMALL BOWEL RESECTION, AND EXCISIONAL BIOPSIES OF MULTIPLE OMENTAL MASSES;  Surgeon: Signe Mitzie LABOR, MD;  Location: [facility];  Service: General;  Laterality: N/A;   TUBAL LIGATION      Family History  Problem Relation Age of Onset   Memory loss Mother    Hypertension Father    Hypertension Brother    Diabetes Maternal Grandmother  Hypertension Maternal Grandmother    Hypertension Maternal Grandfather    Colon cancer Neg Hx    Stomach cancer Neg Hx    Esophageal cancer Neg Hx     Social History   Socioeconomic History   Marital status: Single    Spouse name: Not on file   Number of children: 4   Years of education: Not on file   Highest education  level: Some college, no degree  Occupational History    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  Tobacco Use   Smoking status: Never   Smokeless tobacco: Never  Vaping Use   Vaping status: Never Used  Substance and Sexual Activity   Alcohol use: No   Drug use: No   Sexual activity: Not Currently    Birth control/protection: Surgical  Other Topics Concern   Not on file  Social History Narrative   Not on file   Social Drivers of Health   Financial Resource Strain: Medium Risk (10/29/2023)   Overall Financial Resource Strain (CARDIA)    Difficulty of Paying Living Expenses: Somewhat hard  Food Insecurity: Patient Declined (10/29/2023)   Hunger Vital Sign    Worried About Running Out of Food in the Last Year: Patient declined    Ran Out of Food in the Last Year: Patient declined  Transportation Needs: No Transportation Needs (10/29/2023)   PRAPARE - Administrator, Civil Service (Medical): No    Lack of Transportation (Non-Medical): No  Physical Activity: Inactive (10/29/2023)   Exercise Vital Sign    Days of Exercise per Week: 0 days    Minutes of Exercise per Session: 0 min  Stress: No Stress Concern Present (10/29/2023)   [employer] of Occupational Health - Occupational Stress Questionnaire    Feeling of Stress : Only a little  Social Connections: Moderately Isolated (10/29/2023)   Social Connection and Isolation Panel    Frequency of Communication with Friends and Family: More than three times a week    Frequency of Social Gatherings with Friends and Family: More than three times a week    Attends Religious Services: 1 to 4 times per year    Active Member of [employer] or Organizations: No    Attends Engineer, structural: Not on file    Marital Status: Never married    Current Medications:  Current Outpatient Medications:    acetaminophen  (TYLENOL ) 500 MG tablet, Take 2 tablets (1,000 mg total) by mouth every 8 (eight) hours as needed for mild  pain or fever., Disp: , Rfl:    atorvastatin  (LIPITOR) 10 MG tablet, Take 1 tablet (10 mg total) by mouth daily., Disp: 90 tablet, Rfl: 2   capecitabine  (XELODA ) 500 MG tablet, TAKE  2 tabs in morning and 3 tabs in evening, every 10-12 hours. Take within 30 minutes after meal. Take for 7 days on, then 7 days off, repeat every 14 days., Disp: 70 tablet, Rfl: 1   diclofenac  Sodium (VOLTAREN ) 1 % GEL, Research Patient: Apply 0.5 grams (1 fingertip) to each hand and each foot twice daily for up to 12 weeks, Disp: 400 g, Rfl: 0   Lacosamide  100 MG TABS, Take 1 tablet (100 mg total) by mouth 2 (two) times daily., Disp: 60 tablet, Rfl: 3   LORazepam  (ATIVAN ) 1 MG tablet, Take 1 tablet (1 mg total) by mouth every 6 (six) hours as needed for anxiety., Disp: 30 tablet, Rfl: 0   methocarbamol  (ROBAXIN ) 500 MG tablet, Take 2 tablets (1,000 mg  total) by mouth every 8 (eight) hours as needed for muscle spasms., Disp: 30 tablet, Rfl: 0   nitroGLYCERIN  (NITROSTAT ) 0.4 MG SL tablet, Place 1 tablet (0.4 mg total) under the tongue every 5 (five) minutes as needed for chest pain., Disp: 25 tablet, Rfl: 0   ondansetron  (ZOFRAN ) 8 MG tablet, Take 1 tablet (8 mg total) by mouth every 8 (eight) hours as needed for nausea or vomiting., Disp: 30 tablet, Rfl: 2   polyethylene glycol powder (GLYCOLAX /MIRALAX ) 17 GM/SCOOP powder, Take 17 g by mouth daily mixed in liquid as directed., Disp: 238 g, Rfl: 0   potassium chloride  SA (KLOR-CON  M) 20 MEQ tablet, Take 1 tablet (20 mEq total) by mouth daily. Do not crush or break-up, Disp: 30 tablet, Rfl: 0   prochlorperazine  (COMPAZINE ) 10 MG tablet, Take 1 tablet (10 mg total) by mouth every 6 (six) hours as needed for nausea or vomiting., Disp: 30 tablet, Rfl: 2   simethicone  (MYLICON) 80 MG chewable tablet, Chew 1 tablet (80 mg total) by mouth 4 (four) times daily as needed for flatulence., Disp: 100 tablet, Rfl: 0   traMADol  (ULTRAM ) 50 MG tablet, Take 1 tablet (50 mg total) by mouth  every 6 (six) hours as needed., Disp: 60 tablet, Rfl: 0   valsartan  (DIOVAN ) 40 MG tablet, Take 1 tablet (40 mg total) by mouth daily., Disp: 90 tablet, Rfl: 2  Review of Systems: + Decreased appetite, constipation, diarrhea, menstrual problems, vaginal spotting Denies fevers, chills, fatigue, unexplained weight changes. Denies hearing loss, neck lumps or masses, mouth sores, ringing in ears or voice changes. Denies cough or wheezing.  Denies shortness of breath. Denies chest pain or palpitations. Denies leg swelling. Denies abdominal distention, pain, blood in stools, nausea, vomiting, or early satiety. Denies pain with intercourse, dysuria, frequency, hematuria or incontinence. Denies hot flashes, pelvic pain or vaginal discharge.   Denies joint pain, back pain or muscle pain/cramps. Denies itching, rash, or wounds. Denies dizziness, headaches, numbness or seizures. Denies swollen lymph nodes or glands, denies easy bruising or bleeding. Denies anxiety, depression, confusion, or decreased concentration.  Physical Exam: BP (!) 156/74 (BP Location: Left Arm, Patient Position: Sitting)   Pulse 77   Temp 98.1 F (36.7 C) (Oral)   Resp 20   Wt 191 lb 6.4 oz (86.8 kg)   LMP 05/06/2024   SpO2 100%   BMI 32.85 kg/m  General: Alert, oriented, no acute distress. HEENT: Posterior oropharynx clear, sclera anicteric. Chest: Clear to auscultation bilaterally.  No wheezes or rhonchi. Cardiovascular: Regular rate and rhythm, no murmurs. Abdomen: soft, nontender.  Normoactive bowel sounds.   GU: Speculum exam, well-rugated vaginal mucosa.  Cervix is somewhat difficult to visualize secondary to anterior location and firm bulge within the posterior cul-de-sac.-HPV collected blindly.  On bimanual exam, the cervix is palpably normal.  There is a firm and fixed mass filling the posterior cul-de-sac.  The uterus itself is rather immobile.  Laboratory & Radiologic Studies:        Component Ref Range  & Units (hover) 4 wk ago (04/28/24) 3 mo ago (02/25/24) 4 mo ago (01/28/24) 4 mo ago (12/31/23) 5 mo ago (12/03/23)  CEA ([facility]) 87.38 High  63.15 High  CM 48.68 High  CM 41.76 High  CM 33.73 High  CM     CT C/A/P 04/21/24: 1. CT findings compatible with worsening peritoneal carcinomatosis with progressive heterogeneous soft tissue and fluid along the right-sided the uterus, a new cystic collection in the right hemipelvis measuring  4.9 cm with internal soft tissue nodule measuring 10 mm and overall increased peritoneal and omental nodularity. 2. Stable mixed cystic and solid collection in the rectouterine pouch. 3. Stable prominent lymph nodes in the portacaval region and central mesentery. 4. Stable 3 mm left lower lobe pulmonary nodule. 5. Diffuse hepatic steatosis. 6. Colonic diverticulosis without findings of acute diverticulitis.  Assessment & Plan: Linda [name] is a 58 y.o. woman with metastatic adenocarcinoma of the ileum.  Initially started on FOLFOX, transitioned to CAPOX after C2 due to seizure activity. Progressive disease noted in July 2025. Now on irinotecan  and Xeloda , C2D1 7/30.  Presents today to discuss vaginal bleeding.  Patient is overall doing well after transitioning to new chemotherapy regimen.  She continues to have cyclic spotting, unchanged over the last 3 years.  She has not had a pelvic ultrasound in approximately 2 years but on recent CT scan, there is some fluid in the endometrium.  No prolonged period without bleeding and endometrial biopsy last year showing endometrial polyp and benign endometrium, the patient may still be perimenopausal.  She thinks she may have started having hot flashes or at least some just a temperature regulation when she started chemotherapy.  She is unsure when women in her family have gone through menopause.  Given polyp seen on biopsy in 2024, it is also possible that infrequent episodes of bleeding are related to a persistent endometrial  polyp.  Discussed plan to get hormone levels including FSH, LH, and estradiol .  If these labs indicate that she has postmenopausal, discussed likely recommendation for endometrial sampling to ensure no new uterine pathology.  If lab tests indicate that she is pre or perimenopausal, we discussed option of continued close surveillance versus starting some medication to help decrease or stop bleeding.  She is overall not bothered by the bleeding and would be happy to continue with close surveillance.  She has had a normal Pap but high risk HPV positive in 2023.  Does not look like she ever got follow-up for this.  Repeat Pap performed today.  28 minutes of total time was spent for this patient encounter, including preparation, face-to-face counseling with the patient and coordination of care, and documentation of the encounter.  Comer Dollar, MD  Division of Gynecologic Oncology  Department of Obstetrics and Gynecology  [facility]

## 2024-05-27 NOTE — Patient Instructions (Signed)
 It was very nice to meet you today.  We will check some hormone levels to see if it looks like you are menopausal or pre-/perimenopausal.  If labs indicate that you are postmenopausal, we will discuss possible need for repeat biopsy.  If labs show that you are not yet menopausal, I would suggest that we continue to closely monitor your bleeding pattern.

## 2024-05-28 ENCOUNTER — Inpatient Hospital Stay: Admitting: Gynecologic Oncology

## 2024-05-28 ENCOUNTER — Other Ambulatory Visit: Payer: Self-pay

## 2024-05-28 LAB — FERRITIN: Ferritin: 421 ng/mL — ABNORMAL HIGH (ref 11–307)

## 2024-05-28 LAB — LUTEINIZING HORMONE: LH: 20 m[IU]/mL

## 2024-05-28 LAB — CEA (ACCESS): CEA (CHCC): 61.97 ng/mL — ABNORMAL HIGH (ref 0.00–5.00)

## 2024-05-28 LAB — FOLLICLE STIMULATING HORMONE: FSH: 21.7 m[IU]/mL

## 2024-05-28 LAB — ESTRADIOL: Estradiol: 34.1 pg/mL

## 2024-05-28 NOTE — Progress Notes (Signed)
 Specialty Pharmacy Refill Coordination Note  Linda [name] is a 58 y.o. female contacted today regarding refills of specialty medication(s) Capecitabine  (XELODA )   Patient requested Delivery   Delivery date: 05/31/24   Verified address: [address] [place], [place] [address]   Medication will be filled on 05/28/24.

## 2024-06-01 ENCOUNTER — Telehealth: Payer: Self-pay

## 2024-06-01 NOTE — Telephone Encounter (Signed)
 Notified the pt daughter regarding her FML forms being completed, faxed, and confirmation received. Daughters copy was emailed to her as requested. No questions or concerns to be noted.

## 2024-06-02 ENCOUNTER — Other Ambulatory Visit: Payer: Self-pay

## 2024-06-02 ENCOUNTER — Inpatient Hospital Stay

## 2024-06-02 ENCOUNTER — Inpatient Hospital Stay: Admitting: Hematology

## 2024-06-02 ENCOUNTER — Encounter: Payer: Self-pay | Admitting: Hematology

## 2024-06-02 ENCOUNTER — Ambulatory Visit: Payer: Self-pay | Admitting: Gynecologic Oncology

## 2024-06-02 VITALS — BP 136/66 | HR 95 | Temp 98.1°F | Resp 15 | Ht 64.0 in | Wt 190.8 lb

## 2024-06-02 DIAGNOSIS — C172 Malignant neoplasm of ileum: Secondary | ICD-10-CM

## 2024-06-02 DIAGNOSIS — Z95828 Presence of other vascular implants and grafts: Secondary | ICD-10-CM

## 2024-06-02 DIAGNOSIS — Z5111 Encounter for antineoplastic chemotherapy: Secondary | ICD-10-CM | POA: Diagnosis not present

## 2024-06-02 LAB — CMP (CANCER CENTER ONLY)
ALT: 11 U/L (ref 0–44)
AST: 14 U/L — ABNORMAL LOW (ref 15–41)
Albumin: 3.7 g/dL (ref 3.5–5.0)
Alkaline Phosphatase: 114 U/L (ref 38–126)
Anion gap: 7 (ref 5–15)
BUN: 5 mg/dL — ABNORMAL LOW (ref 6–20)
CO2: 27 mmol/L (ref 22–32)
Calcium: 9 mg/dL (ref 8.9–10.3)
Chloride: 102 mmol/L (ref 98–111)
Creatinine: 0.73 mg/dL (ref 0.44–1.00)
GFR, Estimated: >60 mL/min (ref >60)
Glucose, Bld: 180 mg/dL — ABNORMAL HIGH (ref 70–99)
Potassium: 2.9 mmol/L — ABNORMAL LOW (ref 3.5–5.1)
Sodium: 136 mmol/L (ref 135–145)
Total Bilirubin: 0.3 mg/dL (ref 0.0–1.2)
Total Protein: 7.9 g/dL (ref 6.5–8.1)

## 2024-06-02 LAB — HEPATIC FUNCTION PANEL
ALT: 13 U/L (ref 0–44)
AST: 19 U/L (ref 15–41)
Albumin: 3 g/dL — ABNORMAL LOW (ref 3.5–5.0)
Alkaline Phosphatase: 107 U/L (ref 38–126)
Bilirubin, Direct: <0.1 mg/dL (ref 0.0–0.2)
Total Bilirubin: 0.5 mg/dL (ref 0.0–1.2)
Total Protein: 7.7 g/dL (ref 6.5–8.1)

## 2024-06-02 LAB — CBC WITH DIFFERENTIAL (CANCER CENTER ONLY)
Abs Immature Granulocytes: 0.03 K/uL (ref 0.00–0.07)
Basophils Absolute: 0 K/uL (ref 0.0–0.1)
Basophils Relative: 0 %
Eosinophils Absolute: 0.1 K/uL (ref 0.0–0.5)
Eosinophils Relative: 1 %
HCT: 34.3 % — ABNORMAL LOW (ref 36.0–46.0)
Hemoglobin: 11.4 g/dL — ABNORMAL LOW (ref 12.0–15.0)
Immature Granulocytes: 0 %
Lymphocytes Relative: 23 %
Lymphs Abs: 1.6 K/uL (ref 0.7–4.0)
MCH: 29.3 pg (ref 26.0–34.0)
MCHC: 33.2 g/dL (ref 30.0–36.0)
MCV: 88.2 fL (ref 80.0–100.0)
Monocytes Absolute: 0.5 K/uL (ref 0.1–1.0)
Monocytes Relative: 8 %
Neutro Abs: 4.7 K/uL (ref 1.7–7.7)
Neutrophils Relative %: 68 %
Platelet Count: 471 K/uL — ABNORMAL HIGH (ref 150–400)
RBC: 3.89 MIL/uL (ref 3.87–5.11)
RDW: 13.9 % (ref 11.5–15.5)
WBC Count: 6.9 K/uL (ref 4.0–10.5)
nRBC: 0 % (ref 0.0–0.2)

## 2024-06-02 MED ORDER — SODIUM CHLORIDE 0.9 % IV SOLN
180.0000 mg/m2 | Freq: Once | INTRAVENOUS | Status: AC
  Administered 2024-06-02 (×2): 400 mg via INTRAVENOUS
  Filled 2024-06-02: qty 15

## 2024-06-02 MED ORDER — DEXAMETHASONE SODIUM PHOSPHATE 10 MG/ML IJ SOLN
10.0000 mg | Freq: Once | INTRAMUSCULAR | Status: AC
  Administered 2024-06-02 (×2): 10 mg via INTRAVENOUS
  Filled 2024-06-02: qty 1

## 2024-06-02 MED ORDER — SODIUM CHLORIDE 0.9 % IV SOLN: INTRAVENOUS | Status: DC

## 2024-06-02 MED ORDER — PALONOSETRON HCL INJECTION 0.25 MG/5ML
0.2500 mg | Freq: Once | INTRAVENOUS | Status: AC
  Administered 2024-06-02 (×2): 0.25 mg via INTRAVENOUS
  Filled 2024-06-02: qty 5

## 2024-06-02 MED ORDER — SODIUM CHLORIDE 0.9% FLUSH
10.0000 mL | Freq: Once | INTRAVENOUS | Status: AC
  Administered 2024-06-02 (×2): 10 mL

## 2024-06-02 MED ORDER — POTASSIUM CHLORIDE 10 MEQ/100ML IV SOLN
10.0000 meq | Freq: Once | INTRAVENOUS | Status: AC
  Administered 2024-06-02 (×2): 10 meq via INTRAVENOUS

## 2024-06-02 MED ORDER — CAPECITABINE 500 MG PO TABS
ORAL_TABLET | ORAL | 1 refills | Status: DC
  Filled 2024-06-02 (×2): qty 70, fill #0
  Filled 2024-06-22 (×2): qty 70, 28d supply, fill #0
  Filled 2024-08-04 – 2024-09-07 (×2): qty 70, 28d supply, fill #1

## 2024-06-02 NOTE — Patient Instructions (Signed)
 [facility] - A DEPT OF [facility]  Discharge Instructions: Thank you for choosing [facility] to provide your oncology and hematology care.   If you have a lab appointment with the Cancer Center, please go directly to the Cancer Center and check in at the registration area.   Wear comfortable clothing and clothing appropriate for easy access to any Portacath or PICC line.   We strive to give you quality time with your provider. You may need to reschedule your appointment if you arrive late (15 or more minutes).  Arriving late affects you and other patients whose appointments are after yours.  Also, if you miss three or more appointments without notifying the office, you may be dismissed from the clinic at the provider's discretion.      For prescription refill requests, have your pharmacy contact our office and allow 72 hours for refills to be completed.    Today you received the following chemotherapy and/or immunotherapy agents: Irinotecan       To help prevent nausea and vomiting after your treatment, we encourage you to take your nausea medication as directed.  BELOW ARE SYMPTOMS THAT SHOULD BE REPORTED IMMEDIATELY: *FEVER GREATER THAN 100.4 F (38 C) OR HIGHER *CHILLS OR SWEATING *NAUSEA AND VOMITING THAT IS NOT CONTROLLED WITH YOUR NAUSEA MEDICATION *UNUSUAL SHORTNESS OF BREATH *UNUSUAL BRUISING OR BLEEDING *URINARY PROBLEMS (pain or burning when urinating, or frequent urination) *BOWEL PROBLEMS (unusual diarrhea, constipation, pain near the anus) TENDERNESS IN MOUTH AND THROAT WITH OR WITHOUT PRESENCE OF ULCERS (sore throat, sores in mouth, or a toothache) UNUSUAL RASH, SWELLING OR PAIN  UNUSUAL VAGINAL DISCHARGE OR ITCHING   Items with * indicate a potential emergency and should be followed up as soon as possible or go to the Emergency Department if any problems should occur.  Please show the CHEMOTHERAPY ALERT CARD or IMMUNOTHERAPY  ALERT CARD at check-in to the Emergency Department and triage nurse.  Should you have questions after your visit or need to cancel or reschedule your appointment, please contact [facility] - [facility] DE[facility]  Dept: [phone]  and follow the prompts.  Office hours are 8:00 a.m. to 4:30 p.m. Monday - Friday. Please note that voicemails left after 4:00 p.m. may not be returned until the following business day.  We are closed weekends and major holidays. You have access to a nurse at all times for urgent questions. Please call the main number to the clinic Dept: [phone] and follow the prompts.   For any non-urgent questions, you may also contact your provider using MyChart. We now offer e-Visits for anyone 71 and older to request care online for non-urgent symptoms. For details visit [email or web address].   Also download the MyChart app! Go to the app store, search MyChart, open the app, select [facility] [place], and log in with your MyChart username and password.  Hypokalemia Hypokalemia means that the amount of potassium in the blood is lower than normal. Potassium is a mineral (electrolyte) that helps regulate the amount of fluid in the body. It also stimulates muscle tightening (contraction) and helps nerves work properly. Normally, most of the body's potassium is inside cells, and only a very small amount is in the blood. Because the amount in the blood is so small, minor changes to potassium levels in the blood can be life-threatening. What are the causes? This condition may be caused by: Antibiotic medicine.  Diarrhea or vomiting. Taking too much of a medicine that helps you have a bowel movement (laxative) can cause diarrhea and lead to hypokalemia. Chronic kidney disease (CKD). Medicines that help the body get rid of excess fluid (diuretics). Eating disorders, such as anorexia or bulimia. Low magnesium levels in the body. Sweating a lot. What are  the signs or symptoms? Symptoms of this condition include: Weakness. Constipation. Fatigue. Muscle cramps. Mental confusion. Skipped heartbeats or irregular heartbeat (palpitations). Tingling or numbness. How is this diagnosed? This condition is diagnosed with a blood test. How is this treated? This condition may be treated by: Taking potassium supplements. Adjusting the medicines that you take. Eating more foods that contain a lot of potassium. If your potassium level is very low, you may need to get potassium through an IV and be monitored in the hospital. Follow these instructions at home: Eating and drinking  Eat a healthy diet. A healthy diet includes fresh fruits and vegetables, whole grains, healthy fats, and lean proteins. If told, eat more foods that contain a lot of potassium. These include: Nuts, such as peanuts and pistachios. Seeds, such as sunflower seeds and pumpkin seeds. Peas, lentils, and lima beans. Whole grain and bran cereals and breads. Fresh fruits and vegetables, such as apricots, avocado, bananas, cantaloupe, kiwi, oranges, tomatoes, asparagus, and potatoes. Juices, such as orange, tomato, and prune. Lean meats, including fish. Milk and milk products, such as yogurt. General instructions Take over-the-counter and prescription medicines only as told by your health care provider. This includes vitamins, natural food products, and supplements. Keep all follow-up visits. This is important. Contact a health care provider if: You have weakness that gets worse. You feel your heart pounding or racing. You vomit. You have diarrhea. You have diabetes and you have trouble keeping your blood sugar in your target range. Get help right away if: You have chest pain. You have shortness of breath. You have vomiting or diarrhea that lasts for more than 2 days. You faint. These symptoms may be an emergency. Get help right away. Call 911. Do not wait to see if the  symptoms will go away. Do not drive yourself to the hospital. Summary Hypokalemia means that the amount of potassium in the blood is lower than normal. This condition is diagnosed with a blood test. Hypokalemia may be treated by taking potassium supplements, adjusting the medicines that you take, or eating more foods that are high in potassium. If your potassium level is very low, you may need to get potassium through an IV and be monitored in the hospital. This information is not intended to replace advice given to you by your health care provider. Make sure you discuss any questions you have with your health care provider. Document Revised: 06/21/2021 Document Reviewed: 06/21/2021 Elsevier Patient Education  2024 [employer].

## 2024-06-02 NOTE — Progress Notes (Addendum)
 Dose outside 10% window due to vial rounding. Okay to proceed with 400 mg dose per Dr. Lanny. MD also requested IV KCL 10 mEq, order entered.  Harlene Nasuti, PharmD Oncology Infusion Pharmacist 06/02/2024 11:56 AM

## 2024-06-02 NOTE — Assessment & Plan Note (Addendum)
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  [name] in the liver MRI also showed a 1.8 cm oligo met in segment 7 of liver. -We discussed the incurable nature of her metastatic disease, due to her metastasis in peritoneum and liver.  We also discussed the small possibility of HIPEC and liver directed therapy if she has excellent response to chemotherapy. -I recommend first-line chemotherapy FOLFOX.  She started on July 31, 2023, oxaliplatin  was held for first cycle due to open wound. -She developed seizure activity after first cycle of 5-FU infusion. She was evaluated by new oncologist Dr. Buckley, her seizure was felt to be unlikely related to 5-FU. She felt that seizure was coming after cycle 2 chemo when she coming in for pump d/c and required ativan . I changed her cehmo to CAPOX on 08/28/2023. She required dose reduction  -restaging CT 11/11/2023 showed mixed response with improved liver met and slightly worse peritoneal mets, I slightly increased her xeloda  dose  -restaging CT 02/04/2024 showed stable disease  -unfortunately scan 7/2/205 showed worsening peritoneal metastasis -chemo changed to irinotecan  and Xeloda  on 05/05/2024

## 2024-06-02 NOTE — Progress Notes (Signed)
 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  [name] in the liver MRI also showed a 1.8 cm oligo met in segment 7 of liver. -We discussed the incurable nature of her metastatic disease, due to her metastasis in peritoneum and liver.  We also discussed the small possibility of HIPEC and liver directed therapy if she has excellent response to chemotherapy. -I recommend first-line chemotherapy FOLFOX.  She started on July 31, 2023, oxaliplatin  was held for first cycle due to open wound. -She developed seizure activity after first cycle of 5-FU infusion. She was evaluated by new oncologist Dr. Buckley, her seizure was felt to be unlikely related to 5-FU. She felt that seizure was coming after cycle 2 chemo when she coming in for pump d/c and required ativan . I changed her cehmo to CAPOX on 08/28/2023. 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 She is tolerating treatment well with manageable side effects. No new issues reported. - Continue oral chemotherapy with capecitabine , one week on, one week off - Continue IV chemotherapy every two weeks - Plan to repeat scan after cycle five or six - Schedule next appointments for August 27 and more in September  Chemotherapy-induced anemia Mild anemia with hemoglobin at 1.4, likely secondary to chemotherapy.  Chemotherapy-induced diarrhea Diarrhea present but not severe, providing some relief from chronic constipation. Symptoms resolve a few days after onset.  Chemotherapy-induced insomnia Insomnia occurs for a couple of days after chemotherapy administration.  Hypokalemia Previous low potassium levels, current level not yet available. - Monitor potassium levels - Increase potassium supplementation to two or three times a day if levels are low  Plan - Patient is overall tolerating treatment well, lab reviewed, adequate for treatment, will proceed chemo today and continue every 2 weeks - She will continue oral capecitabine  at home at the same dose - Follow-up in 2 weeks before next cycle chemo   SUMMARY OF ONCOLOGIC HISTORY: Oncology History  Cancer of ileum with carcinomatosis  07/10/2023 Initial Diagnosis   Cancer of ileum with carcinomatosis   07/22/2023 Cancer Staging   Staging form: Small Intestine - Adenocarcinoma, AJCC 8th Edition - Pathologic: Stage IV (pT4, pNX, pM1) - Signed by Lanny Callander, MD on 07/22/2023 Histologic grade (G): G2 Histologic grading system: 4 grade system   07/31/2023 - 08/16/2023 Chemotherapy   Patient is on Treatment Plan : COLORECTAL FOLFOX q14d     08/28/2023 - 03/24/2024 Chemotherapy  Patient is on Treatment Plan : COLORECTAL CAPEOX (130/850) q21d x 8 cycles      05/05/2024 -  Chemotherapy   Patient is on Treatment Plan : COLORECTAL  Irinotecan  (200) D1 + Capecitabine  D1-7 (XELIRI) q14 Days        Discussed the use of AI scribe software for clinical note transcription with the patient, who gave verbal consent to proceed.  History of Present Illness Linda [name] is a 58 year old female with metastatic small bowel cancer who presents for follow-up.  She is undergoing treatment with an oral chemotherapy regimen of capecitabine , taken one week on and one week off, along with IV chemotherapy every two weeks. She recently received a new supply of her oral chemotherapy medication.  She experiences insomnia and mild diarrhea as side effects of her chemotherapy. The diarrhea provides some relief from her usual constipation. She has only taken tramadol  once for pain management and has sufficient nausea medication at home.  She takes potassium supplements once daily, along with blood pressure medication and iron supplements.     All other systems were reviewed with the patient and are negative.  MEDICAL HISTORY:  Past Medical History:  Diagnosis Date   Abnormal uterine bleeding 04/10/2022   Anemia    Blood transfusion without reported diagnosis    Hypertension    Pre-diabetes 02/2022   Stomach cancer [facility])     SURGICAL HISTORY: Past Surgical History:  Procedure Laterality Date   BREAST LUMPECTOMY WITH RADIOACTIVE SEED LOCALIZATION Left 06/10/2022   Procedure: LEFT BREAST LUMPECTOMY WITH RADIOACTIVE SEED LOCALIZATION;  Surgeon: Curvin Deward MOULD, MD;  Location: [facility];  Service: General;  Laterality: Left;   CESAREAN SECTION     x4   COLON SURGERY     IR IMAGING GUIDED PORT INSERTION  07/08/2023   LAPAROTOMY N/A 06/30/2023   Procedure: EXPLORATORY LAPAROTOMY, SMALL BOWEL RESECTION, AND EXCISIONAL BIOPSIES OF MULTIPLE OMENTAL MASSES;  Surgeon: Signe Mitzie DELENA, MD;  Location: [facility];  Service: General;  Laterality: N/A;    TUBAL LIGATION      I have reviewed the social history and family history with the patient and they are unchanged from previous note.  ALLERGIES:  has no known allergies.  MEDICATIONS:  Current Outpatient Medications  Medication Sig Dispense Refill   acetaminophen  (TYLENOL ) 500 MG tablet Take 2 tablets (1,000 mg total) by mouth every 8 (eight) hours as needed for mild pain or fever.     atorvastatin  (LIPITOR) 10 MG tablet Take 1 tablet (10 mg total) by mouth daily. 90 tablet 2   capecitabine  (XELODA ) 500 MG tablet TAKE  2 tabs in morning and 3 tabs in evening, every 10-12 hours. Take within 30 minutes after meal. Take for 7 days on, then 7 days off, repeat every 14 days. 70 tablet 1   diclofenac  Sodium (VOLTAREN ) 1 % GEL Research Patient: Apply 0.5 grams (1 fingertip) to each hand and each foot twice daily for up to 12 weeks 400 g 0   Lacosamide  100 MG TABS Take 1 tablet (100 mg total) by mouth 2 (two) times daily. 60 tablet 3   LORazepam  (ATIVAN ) 1 MG tablet Take 1 tablet (1 mg total) by mouth every 6 (six) hours as needed for anxiety. 30 tablet 0   methocarbamol  (ROBAXIN ) 500 MG tablet Take 2 tablets (1,000 mg total) by mouth every 8 (eight) hours as needed for muscle spasms. 30 tablet 0   nitroGLYCERIN  (NITROSTAT ) 0.4 MG SL tablet Place  1 tablet (0.4 mg total) under the tongue every 5 (five) minutes as needed for chest pain. 25 tablet 0   ondansetron  (ZOFRAN ) 8 MG tablet Take 1 tablet (8 mg total) by mouth every 8 (eight) hours as needed for nausea or vomiting. 30 tablet 2   polyethylene glycol powder (GLYCOLAX /MIRALAX ) 17 GM/SCOOP powder Take 17 g by mouth daily mixed in liquid as directed. 238 g 0   potassium chloride  SA (KLOR-CON  M) 20 MEQ tablet Take 1 tablet (20 mEq total) by mouth daily. Do not crush or break-up 30 tablet 0   prochlorperazine  (COMPAZINE ) 10 MG tablet Take 1 tablet (10 mg total) by mouth every 6 (six) hours as needed for nausea or vomiting. 30 tablet 2   simethicone   (MYLICON) 80 MG chewable tablet Chew 1 tablet (80 mg total) by mouth 4 (four) times daily as needed for flatulence. 100 tablet 0   traMADol  (ULTRAM ) 50 MG tablet Take 1 tablet (50 mg total) by mouth every 6 (six) hours as needed. 60 tablet 0   valsartan  (DIOVAN ) 40 MG tablet Take 1 tablet (40 mg total) by mouth daily. 90 tablet 2   No current facility-administered medications for this visit.   Facility-Administered Medications Ordered in Other Visits  Medication Dose Route Frequency Provider Last Rate Last Admin   0.9 %  sodium chloride  infusion   Intravenous Continuous Lanny Callander, MD   Stopped at 06/02/24 1515    PHYSICAL EXAMINATION: ECOG PERFORMANCE STATUS: 1 - Symptomatic but completely ambulatory  Vitals:   06/02/24 1110  BP: 136/66  Pulse: 95  Resp: 15  Temp: 98.1 F (36.7 C)  SpO2: 99%   Wt Readings from Last 3 Encounters:  06/02/24 190 lb 12.8 oz (86.5 kg)  05/27/24 191 lb 6.4 oz (86.8 kg)  05/19/24 191 lb 11.2 oz (87 kg)     GENERAL:alert, no distress and comfortable SKIN: skin color, texture, turgor are normal, no rashes or significant lesions EYES: normal, Conjunctiva are pink and non-injected, sclera clear NECK: supple, thyroid  normal size, non-tender, without nodularity LYMPH:  no palpable lymphadenopathy in the cervical, axillary  LUNGS: clear to auscultation and percussion with normal breathing effort HEART: regular rate & rhythm and no murmurs and no lower extremity edema ABDOMEN:abdomen soft, non-tender and normal bowel sounds Musculoskeletal:no cyanosis of digits and no clubbing  NEURO: alert & oriented x 3 with fluent speech, no focal motor/sensory deficits  Physical Exam    LABORATORY DATA:  I have reviewed the data as listed    Latest Ref Rng & Units 06/02/2024   10:40 AM 05/27/2024    3:16 PM 05/19/2024   12:33 PM  CBC  WBC 4.0 - 10.5 K/uL 6.9  4.8  10.6   Hemoglobin 12.0 - 15.0 g/dL 88.5  87.7  88.1   Hematocrit 36.0 - 46.0 % 34.3  36.5  34.8    Platelets 150 - 400 K/uL 471  352  422         Latest Ref Rng & Units 06/02/2024   10:42 AM 06/02/2024   10:40 AM 05/19/2024   12:38 PM  CMP  Glucose 70 - 99 mg/dL  819    BUN 6 - 20 mg/dL  5    Creatinine 9.55 - 1.00 mg/dL  9.26    Sodium 864 - 854 mmol/L  136    Potassium 3.5 - 5.1 mmol/L  2.9    Chloride 98 - 111 mmol/L  102    CO2 22 - 32 mmol/L  27    Calcium  8.9 - 10.3 mg/dL  9.0    Total Protein 6.5 - 8.1 g/dL 7.7  7.9  7.8   Total Bilirubin 0.0 - 1.2 mg/dL 0.5  0.3  0.6   Alkaline Phos 38 - 126 U/L 107  114  154   AST 15 - 41 U/L 19  14  27    ALT 0 - 44 U/L 13  11  21        RADIOGRAPHIC STUDIES: I have personally reviewed the radiological images as listed and agreed with the findings in the report. No results found.    Orders Placed This Encounter  Procedures   CBC with Differential (Cancer Center Only)    Standing Status:   Future    Expected Date:   06/30/2024    Expiration Date:   06/30/2025   CMP (Cancer Center only)    Standing Status:   Future    Expected Date:   06/30/2024    Expiration Date:   06/30/2025   CBC with Differential (Cancer Center Only)    Standing Status:   Future    Expected Date:   07/14/2024    Expiration Date:   07/14/2025   CMP (Cancer Center only)    Standing Status:   Future    Expected Date:   07/14/2024    Expiration Date:   07/14/2025   CBC with Differential (Cancer Center Only)    Standing Status:   Future    Expected Date:   07/28/2024    Expiration Date:   07/28/2025   CMP (Cancer Center only)    Standing Status:   Future    Expected Date:   07/28/2024    Expiration Date:   07/28/2025   All questions were answered. The patient knows to call the clinic with any problems, questions or concerns. No barriers to learning was detected. The total time spent in the appointment was 25 minutes, including review of chart and various tests results, discussions about plan of care and coordination of care plan     Onita Mattock, MD 06/02/2024

## 2024-06-03 LAB — CYTOLOGY - PAP
Adequacy: ABSENT
Comment: NEGATIVE
Diagnosis: UNDETERMINED — AB
High risk HPV: POSITIVE — AB

## 2024-06-09 NOTE — Telephone Encounter (Signed)
-----   Message from Comer JONELLE Dollar sent at 06/09/2024  8:47 AM EDT ----- Could you please follow-up with her? I've sent mychart messages about 2 results (which she has read). Thanks! ----- Message ----- From: Interface, Lab In Three Zero One Sent: 06/03/2024  12:03 PM EDT To: Comer JONELLE Dollar, MD

## 2024-06-09 NOTE — Telephone Encounter (Signed)
 Spoke with patient in regards to result follow up. Pt states she did read Dr.Tucker's message and would like to schedule an appointment today. Pt was given a 30 minute appt. For Friday, October 31st. At 2:45 pm. Pt agreed to this appointment and advised the office may call back with an earlier date. Pt thanked the office for calling.

## 2024-06-10 ENCOUNTER — Other Ambulatory Visit: Payer: Self-pay | Admitting: Hematology

## 2024-06-10 ENCOUNTER — Other Ambulatory Visit (HOSPITAL_COMMUNITY): Payer: Self-pay

## 2024-06-10 MED ORDER — POTASSIUM CHLORIDE CRYS ER 20 MEQ PO TBCR
20.0000 meq | EXTENDED_RELEASE_TABLET | Freq: Every day | ORAL | 0 refills | Status: DC
  Filled 2024-06-10 – 2024-06-23 (×2): qty 30, 30d supply, fill #0

## 2024-06-10 NOTE — Telephone Encounter (Signed)
 Left voicemail for Linda [name] to call back and confirm that she received message of her rescheduled appointment with Dr. Viktoria from October 31 st. To Thursday, September 25 th. At 4 pm with arrival time of 3:30 pm for check in.

## 2024-06-10 NOTE — Telephone Encounter (Signed)
-----   Message from Comer JONELLE Dollar sent at 06/09/2024  8:47 AM EDT ----- Could you please follow-up with her? I've sent mychart messages about 2 results (which she has read). Thanks! ----- Message ----- From: Interface, Lab In Three Zero One Sent: 06/03/2024  12:03 PM EDT To: Comer JONELLE Dollar, MD

## 2024-06-16 ENCOUNTER — Other Ambulatory Visit: Payer: Self-pay

## 2024-06-16 ENCOUNTER — Inpatient Hospital Stay

## 2024-06-16 ENCOUNTER — Inpatient Hospital Stay: Admitting: Dietician

## 2024-06-16 ENCOUNTER — Encounter: Payer: Self-pay | Admitting: Hematology

## 2024-06-16 ENCOUNTER — Inpatient Hospital Stay (HOSPITAL_BASED_OUTPATIENT_CLINIC_OR_DEPARTMENT_OTHER): Admitting: Hematology

## 2024-06-16 VITALS — BP 138/78 | HR 74 | Temp 98.0°F | Resp 17 | Ht 64.0 in | Wt 191.4 lb

## 2024-06-16 DIAGNOSIS — C172 Malignant neoplasm of ileum: Secondary | ICD-10-CM

## 2024-06-16 DIAGNOSIS — D500 Iron deficiency anemia secondary to blood loss (chronic): Secondary | ICD-10-CM

## 2024-06-16 DIAGNOSIS — Z5111 Encounter for antineoplastic chemotherapy: Secondary | ICD-10-CM | POA: Diagnosis not present

## 2024-06-16 LAB — CMP (CANCER CENTER ONLY)
ALT: 9 U/L (ref 0–44)
AST: 13 U/L — ABNORMAL LOW (ref 15–41)
Albumin: 3.5 g/dL (ref 3.5–5.0)
Alkaline Phosphatase: 82 U/L (ref 38–126)
Anion gap: 8 (ref 5–15)
BUN: 6 mg/dL (ref 6–20)
CO2: 25 mmol/L (ref 22–32)
Calcium: 8.6 mg/dL — ABNORMAL LOW (ref 8.9–10.3)
Chloride: 105 mmol/L (ref 98–111)
Creatinine: 0.65 mg/dL (ref 0.44–1.00)
GFR, Estimated: >60 mL/min (ref >60)
Glucose, Bld: 156 mg/dL — ABNORMAL HIGH (ref 70–99)
Potassium: 2.9 mmol/L — ABNORMAL LOW (ref 3.5–5.1)
Sodium: 138 mmol/L (ref 135–145)
Total Bilirubin: 0.4 mg/dL (ref 0.0–1.2)
Total Protein: 7.2 g/dL (ref 6.5–8.1)

## 2024-06-16 LAB — CBC WITH DIFFERENTIAL/PLATELET
Abs Immature Granulocytes: 0.03 K/uL (ref 0.00–0.07)
Basophils Absolute: 0 K/uL (ref 0.0–0.1)
Basophils Relative: 0 %
Eosinophils Absolute: 0.2 K/uL (ref 0.0–0.5)
Eosinophils Relative: 3 %
HCT: 34.1 % — ABNORMAL LOW (ref 36.0–46.0)
Hemoglobin: 11.1 g/dL — ABNORMAL LOW (ref 12.0–15.0)
Immature Granulocytes: 0 %
Lymphocytes Relative: 22 %
Lymphs Abs: 1.6 K/uL (ref 0.7–4.0)
MCH: 28.8 pg (ref 26.0–34.0)
MCHC: 32.6 g/dL (ref 30.0–36.0)
MCV: 88.6 fL (ref 80.0–100.0)
Monocytes Absolute: 0.6 K/uL (ref 0.1–1.0)
Monocytes Relative: 8 %
Neutro Abs: 4.6 K/uL (ref 1.7–7.7)
Neutrophils Relative %: 67 %
Platelets: 369 K/uL (ref 150–400)
RBC: 3.85 MIL/uL — ABNORMAL LOW (ref 3.87–5.11)
RDW: 15 % (ref 11.5–15.5)
WBC: 7 K/uL (ref 4.0–10.5)
nRBC: 0 % (ref 0.0–0.2)

## 2024-06-16 LAB — HEPATIC FUNCTION PANEL
ALT: 11 U/L (ref 0–44)
AST: 19 U/L (ref 15–41)
Albumin: 3.7 g/dL (ref 3.5–5.0)
Alkaline Phosphatase: 101 U/L (ref 38–126)
Bilirubin, Direct: 0.2 mg/dL (ref 0.0–0.2)
Indirect Bilirubin: 0.3 mg/dL (ref 0.3–0.9)
Total Bilirubin: 0.5 mg/dL (ref 0.0–1.2)
Total Protein: 7.1 g/dL (ref 6.5–8.1)

## 2024-06-16 LAB — FERRITIN: Ferritin: 246 ng/mL (ref 11–307)

## 2024-06-16 LAB — CEA (ACCESS): CEA (CHCC): 56.73 ng/mL — ABNORMAL HIGH (ref 0.00–5.00)

## 2024-06-16 MED ORDER — POTASSIUM CHLORIDE 10 MEQ/100ML IV SOLN
10.0000 meq | INTRAVENOUS | Status: AC
  Administered 2024-06-16 (×2): 10 meq via INTRAVENOUS
  Filled 2024-06-16 (×2): qty 100

## 2024-06-16 MED ORDER — PALONOSETRON HCL INJECTION 0.25 MG/5ML
0.2500 mg | Freq: Once | INTRAVENOUS | Status: AC
  Administered 2024-06-16: 0.25 mg via INTRAVENOUS
  Filled 2024-06-16: qty 5

## 2024-06-16 MED ORDER — DEXAMETHASONE SODIUM PHOSPHATE 10 MG/ML IJ SOLN
10.0000 mg | Freq: Once | INTRAMUSCULAR | Status: AC
  Administered 2024-06-16: 10 mg via INTRAVENOUS
  Filled 2024-06-16: qty 1

## 2024-06-16 MED ORDER — SODIUM CHLORIDE 0.9 % IV SOLN
180.0000 mg/m2 | Freq: Once | INTRAVENOUS | Status: AC
  Administered 2024-06-16: 400 mg via INTRAVENOUS
  Filled 2024-06-16: qty 15

## 2024-06-16 MED ORDER — SODIUM CHLORIDE 0.9 % IV SOLN: INTRAVENOUS | Status: DC

## 2024-06-16 NOTE — Assessment & Plan Note (Signed)
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  [name] in the liver MRI also showed a 1.8 cm oligo met in segment 7 of liver. -We discussed the incurable nature of her metastatic disease, due to her metastasis in peritoneum and liver.  We also discussed the small possibility of HIPEC and liver directed therapy if she has excellent response to chemotherapy. -I recommend first-line chemotherapy FOLFOX.  She started on July 31, 2023, oxaliplatin  was held for first cycle due to open wound. -She developed seizure activity after first cycle of 5-FU infusion. She was evaluated by new oncologist Dr. Buckley, her seizure was felt to be unlikely related to 5-FU. She felt that seizure was coming after cycle 2 chemo when she coming in for pump d/c and required ativan . I changed her cehmo to CAPOX on 08/28/2023. She required dose reduction  -restaging CT 11/11/2023 showed mixed response with improved liver met and slightly worse peritoneal mets, I slightly increased her xeloda  dose  -restaging CT 02/04/2024 showed stable disease  -unfortunately scan 7/2/205 showed worsening peritoneal metastasis -chemo changed to irinotecan  and Xeloda  on 05/05/2024

## 2024-06-16 NOTE — Progress Notes (Signed)
 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  [name] in the liver MRI also showed a 1.8 cm oligo met in segment 7 of liver. -We discussed the incurable nature of her metastatic disease, due to her metastasis in peritoneum and liver.  We also discussed the small possibility of HIPEC and liver directed therapy if she has excellent response to chemotherapy. -I recommend first-line chemotherapy FOLFOX.  She started on July 31, 2023, oxaliplatin  was held for first cycle due to open wound. -She developed seizure activity after first cycle of 5-FU infusion. She was evaluated by new oncologist Dr. Buckley, her seizure was felt to be unlikely related to 5-FU. She felt that seizure was coming after cycle 2 chemo when she coming in for pump d/c and required ativan . I changed her cehmo to CAPOX on 08/28/2023. She required dose reduction  -restaging CT 11/11/2023 showed mixed response with improved liver met and slightly worse peritoneal mets, I slightly increased her xeloda  dose  -restaging CT 02/04/2024 showed stable disease  -unfortunately scan 7/2/205 showed worsening peritoneal metastasis -chemo changed to irinotecan  and Xeloda  on  05/05/2024  Assessment & Plan Metastatic small bowel cancer Undergoing chemotherapy with good tolerance and no severe side effects. Blood counts are adequate for continuation. Last scan was on July 2nd; next scan is scheduled for October. - Schedule next scan for October  Chemotherapy-induced diarrhea Experiencing manageable diarrhea contributing to hypokalemia. Reports approximately three bowel movements daily. Not using Imodium as symptoms are regular. 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 Current drug is not associated with significant neuropathy; symptoms remain mild.  Hypokalemia Likely secondary to chemotherapy-induced diarrhea. Currently taking potassium supplements three times daily. Recently had a lapse in supplementation but has refilled prescription. - Continue oral potassium supplementation three times daily - Monitor potassium levels - Consider IV potassium supplementation if levels are very low  Plan - She is clinically doing well overall, tolerating treatment well.  Will proceed to chemo - Potassium 2.9 today, will give IV 20 meq potassium chloride , she will restart oral potassium 3 times a day - Follow-up in 2 weeks before next cycle chemo   SUMMARY OF ONCOLOGIC HISTORY: Oncology History  Cancer of ileum with carcinomatosis  07/10/2023 Initial Diagnosis   Cancer of ileum with carcinomatosis   07/22/2023 Cancer Staging   Staging form: Small Intestine - Adenocarcinoma, AJCC 8th Edition - Pathologic: Stage IV (pT4, pNX, pM1) - Signed by Lanny Callander, MD on 07/22/2023 Histologic grade (G): G2 Histologic grading system: 4 grade system   07/31/2023 - 08/16/2023 Chemotherapy   Patient is on Treatment Plan : COLORECTAL FOLFOX q14d     08/28/2023 - 03/24/2024 Chemotherapy  Patient is on Treatment Plan :  COLORECTAL CAPEOX (130/850) q21d x 8 cycles     05/05/2024 -  Chemotherapy   Patient is on Treatment Plan : COLORECTAL  Irinotecan  (200) D1 + Capecitabine  D1-7 (XELIRI) q14 Days        Discussed the use of AI scribe software for clinical note transcription with the patient, who gave verbal consent to proceed.  History of Present Illness Linda [name] is a 58 year old female with metastatic small bowel cancer who presents for follow-up. She is accompanied by her daughter.  She feels well with no new symptoms. Her weight is stable, and she maintains a good appetite. She is on a chemotherapy pill, refilled two weeks ago.  She experiences mild neuropathy and diarrhea three times daily, attributed to the chemotherapy. She manages the diarrhea without Imodium and takes potassium supplements thrice daily. Her potassium prescription issue is resolved.  Her last scan was on July 2nd, with the next scheduled for October.     All other systems were reviewed with the patient and are negative.  MEDICAL HISTORY:  Past Medical History:  Diagnosis Date   Abnormal uterine bleeding 04/10/2022   Anemia    Blood transfusion without reported diagnosis    Hypertension    Pre-diabetes 02/2022   Stomach cancer [facility])     SURGICAL HISTORY: Past Surgical History:  Procedure Laterality Date   BREAST LUMPECTOMY WITH RADIOACTIVE SEED LOCALIZATION Left 06/10/2022   Procedure: LEFT BREAST LUMPECTOMY WITH RADIOACTIVE SEED LOCALIZATION;  Surgeon: Curvin Deward MOULD, MD;  Location: [facility];  Service: General;  Laterality: Left;   CESAREAN SECTION     x4   COLON SURGERY     IR IMAGING GUIDED PORT INSERTION  07/08/2023   LAPAROTOMY N/A 06/30/2023   Procedure: EXPLORATORY LAPAROTOMY, SMALL BOWEL RESECTION, AND EXCISIONAL BIOPSIES OF MULTIPLE OMENTAL MASSES;  Surgeon: Signe Mitzie DELENA, MD;  Location: [facility];  Service: General;  Laterality: N/A;   TUBAL LIGATION      I have reviewed the  social history and family history with the patient and they are unchanged from previous note.  ALLERGIES:  has no known allergies.  MEDICATIONS:  Current Outpatient Medications  Medication Sig Dispense Refill   acetaminophen  (TYLENOL ) 500 MG tablet Take 2 tablets (1,000 mg total) by mouth every 8 (eight) hours as needed for mild pain or fever.     atorvastatin  (LIPITOR) 10 MG tablet Take 1 tablet (10 mg total) by mouth daily. 90 tablet 2   capecitabine  (XELODA ) 500 MG tablet TAKE  2 tabs in morning and 3 tabs in evening, every 10-12 hours. Take within 30 minutes after meal. Take for 7 days on, then 7 days off, repeat every 14 days. 70 tablet 1   diclofenac  Sodium (VOLTAREN ) 1 % GEL Research Patient: Apply 0.5 grams (1 fingertip) to each hand and each foot twice daily for up to 12 weeks 400 g 0   Lacosamide  100 MG TABS Take 1 tablet (100 mg total) by mouth 2 (two) times daily. 60 tablet 3   LORazepam  (ATIVAN ) 1 MG tablet Take 1 tablet (1 mg total) by mouth every 6 (six) hours as needed for anxiety. 30 tablet 0   methocarbamol  (ROBAXIN ) 500 MG tablet Take 2 tablets (1,000 mg total) by mouth every 8 (eight) hours as needed for muscle spasms. 30 tablet 0   nitroGLYCERIN  (NITROSTAT ) 0.4 MG SL tablet Place 1 tablet (0.4 mg total) under the tongue every 5 (five)  minutes as needed for chest pain. 25 tablet 0   ondansetron  (ZOFRAN ) 8 MG tablet Take 1 tablet (8 mg total) by mouth every 8 (eight) hours as needed for nausea or vomiting. 30 tablet 2   polyethylene glycol powder (GLYCOLAX /MIRALAX ) 17 GM/SCOOP powder Take 17 g by mouth daily mixed in liquid as directed. 238 g 0   potassium chloride  SA (KLOR-CON  M) 20 MEQ tablet Take 1 tablet (20 mEq total) by mouth daily. Do not crush or break-up 30 tablet 0   prochlorperazine  (COMPAZINE ) 10 MG tablet Take 1 tablet (10 mg total) by mouth every 6 (six) hours as needed for nausea or vomiting. 30 tablet 2   simethicone  (MYLICON) 80 MG chewable tablet Chew 1 tablet  (80 mg total) by mouth 4 (four) times daily as needed for flatulence. 100 tablet 0   traMADol  (ULTRAM ) 50 MG tablet Take 1 tablet (50 mg total) by mouth every 6 (six) hours as needed. 60 tablet 0   valsartan  (DIOVAN ) 40 MG tablet Take 1 tablet (40 mg total) by mouth daily. 90 tablet 2   No current facility-administered medications for this visit.   Facility-Administered Medications Ordered in Other Visits  Medication Dose Route Frequency Provider Last Rate Last Admin   0.9 %  sodium chloride  infusion   Intravenous Continuous Lanny Callander, MD 10 mL/hr at 06/16/24 1220 New Bag at 06/16/24 1220   irinotecan  (CAMPTOSAR ) 400 mg in sodium chloride  0.9 % 500 mL chemo infusion  180 mg/m2 (Treatment Plan Recorded) Intravenous Once Lanny Callander, MD       potassium chloride  10 mEq in 100 mL IVPB  10 mEq Intravenous Q1 Hr x 2 Lanny Callander, MD 100 mL/hr at 06/16/24 1303 10 mEq at 06/16/24 1303    PHYSICAL EXAMINATION: ECOG PERFORMANCE STATUS: 1 - Symptomatic but completely ambulatory  Vitals:   06/16/24 1005  BP: 138/78  Pulse: 74  Resp: 17  Temp: 98 F (36.7 C)  SpO2: 99%   Wt Readings from Last 3 Encounters:  06/16/24 191 lb 6.4 oz (86.8 kg)  06/02/24 190 lb 12.8 oz (86.5 kg)  05/27/24 191 lb 6.4 oz (86.8 kg)     GENERAL:alert, no distress and comfortable SKIN: skin color, texture, turgor are normal, no rashes or significant lesions EYES: normal, Conjunctiva are pink and non-injected, sclera clear NECK: supple, thyroid  normal size, non-tender, without nodularity LYMPH:  no palpable lymphadenopathy in the cervical, axillary  LUNGS: clear to auscultation and percussion with normal breathing effort HEART: regular rate & rhythm and no murmurs and no lower extremity edema ABDOMEN:abdomen soft, non-tender and normal bowel sounds Musculoskeletal:no cyanosis of digits and no clubbing  NEURO: alert & oriented x 3 with fluent speech, no focal motor/sensory deficits  Physical Exam    LABORATORY DATA:   I have reviewed the data as listed    Latest Ref Rng & Units 06/16/2024    9:44 AM 06/02/2024   10:40 AM 05/27/2024    3:16 PM  CBC  WBC 4.0 - 10.5 K/uL 7.0  6.9  4.8   Hemoglobin 12.0 - 15.0 g/dL 88.8  88.5  87.7   Hematocrit 36.0 - 46.0 % 34.1  34.3  36.5   Platelets 150 - 400 K/uL 369  471  352         Latest Ref Rng & Units 06/16/2024   11:29 AM 06/16/2024    9:44 AM 06/02/2024   10:42 AM  CMP  Glucose 70 - 99 mg/dL 843  BUN 6 - 20 mg/dL 6     Creatinine 9.55 - 1.00 mg/dL 9.34     Sodium 864 - 854 mmol/L 138     Potassium 3.5 - 5.1 mmol/L 2.9     Chloride 98 - 111 mmol/L 105     CO2 22 - 32 mmol/L 25     Calcium  8.9 - 10.3 mg/dL 8.6     Total Protein 6.5 - 8.1 g/dL 7.2  7.1  7.7   Total Bilirubin 0.0 - 1.2 mg/dL 0.4  0.5  0.5   Alkaline Phos 38 - 126 U/L 82  101  107   AST 15 - 41 U/L 13  19  19    ALT 0 - 44 U/L 9  11  13        RADIOGRAPHIC STUDIES: I have personally reviewed the radiological images as listed and agreed with the findings in the report. No results found.    No orders of the defined types were placed in this encounter.  All questions were answered. The patient knows to call the clinic with any problems, questions or concerns. No barriers to learning was detected. The total time spent in the appointment was 25 minutes, including review of chart and various tests results, discussions about plan of care and coordination of care plan     Onita Mattock, MD 06/16/2024

## 2024-06-16 NOTE — Patient Instructions (Signed)
 [facility] - A DEPT OF [facility]  Discharge Instructions: Thank you for choosing [facility] to provide your oncology and hematology care.   If you have a lab appointment with the Cancer Center, please go directly to the Cancer Center and check in at the registration area.   Wear comfortable clothing and clothing appropriate for easy access to any Portacath or PICC line.   We strive to give you quality time with your provider. You may need to reschedule your appointment if you arrive late (15 or more minutes).  Arriving late affects you and other patients whose appointments are after yours.  Also, if you miss three or more appointments without notifying the office, you may be dismissed from the clinic at the provider's discretion.      For prescription refill requests, have your pharmacy contact our office and allow 72 hours for refills to be completed.    Today you received the following chemotherapy and/or immunotherapy agents: Irinotecan       To help prevent nausea and vomiting after your treatment, we encourage you to take your nausea medication as directed.  BELOW ARE SYMPTOMS THAT SHOULD BE REPORTED IMMEDIATELY: *FEVER GREATER THAN 100.4 F (38 C) OR HIGHER *CHILLS OR SWEATING *NAUSEA AND VOMITING THAT IS NOT CONTROLLED WITH YOUR NAUSEA MEDICATION *UNUSUAL SHORTNESS OF BREATH *UNUSUAL BRUISING OR BLEEDING *URINARY PROBLEMS (pain or burning when urinating, or frequent urination) *BOWEL PROBLEMS (unusual diarrhea, constipation, pain near the anus) TENDERNESS IN MOUTH AND THROAT WITH OR WITHOUT PRESENCE OF ULCERS (sore throat, sores in mouth, or a toothache) UNUSUAL RASH, SWELLING OR PAIN  UNUSUAL VAGINAL DISCHARGE OR ITCHING   Items with * indicate a potential emergency and should be followed up as soon as possible or go to the Emergency Department if any problems should occur.  Please show the CHEMOTHERAPY ALERT CARD or IMMUNOTHERAPY  ALERT CARD at check-in to the Emergency Department and triage nurse.  Should you have questions after your visit or need to cancel or reschedule your appointment, please contact [facility] - [facility] DE[facility]  Dept: [phone]  and follow the prompts.  Office hours are 8:00 a.m. to 4:30 p.m. Monday - Friday. Please note that voicemails left after 4:00 p.m. may not be returned until the following business day.  We are closed weekends and major holidays. You have access to a nurse at all times for urgent questions. Please call the main number to the clinic Dept: [phone] and follow the prompts.   For any non-urgent questions, you may also contact your provider using MyChart. We now offer e-Visits for anyone 71 and older to request care online for non-urgent symptoms. For details visit [email or web address].   Also download the MyChart app! Go to the app store, search MyChart, open the app, select [facility] [place], and log in with your MyChart username and password.  Hypokalemia Hypokalemia means that the amount of potassium in the blood is lower than normal. Potassium is a mineral (electrolyte) that helps regulate the amount of fluid in the body. It also stimulates muscle tightening (contraction) and helps nerves work properly. Normally, most of the body's potassium is inside cells, and only a very small amount is in the blood. Because the amount in the blood is so small, minor changes to potassium levels in the blood can be life-threatening. What are the causes? This condition may be caused by: Antibiotic medicine.  Diarrhea or vomiting. Taking too much of a medicine that helps you have a bowel movement (laxative) can cause diarrhea and lead to hypokalemia. Chronic kidney disease (CKD). Medicines that help the body get rid of excess fluid (diuretics). Eating disorders, such as anorexia or bulimia. Low magnesium levels in the body. Sweating a lot. What are  the signs or symptoms? Symptoms of this condition include: Weakness. Constipation. Fatigue. Muscle cramps. Mental confusion. Skipped heartbeats or irregular heartbeat (palpitations). Tingling or numbness. How is this diagnosed? This condition is diagnosed with a blood test. How is this treated? This condition may be treated by: Taking potassium supplements. Adjusting the medicines that you take. Eating more foods that contain a lot of potassium. If your potassium level is very low, you may need to get potassium through an IV and be monitored in the hospital. Follow these instructions at home: Eating and drinking  Eat a healthy diet. A healthy diet includes fresh fruits and vegetables, whole grains, healthy fats, and lean proteins. If told, eat more foods that contain a lot of potassium. These include: Nuts, such as peanuts and pistachios. Seeds, such as sunflower seeds and pumpkin seeds. Peas, lentils, and lima beans. Whole grain and bran cereals and breads. Fresh fruits and vegetables, such as apricots, avocado, bananas, cantaloupe, kiwi, oranges, tomatoes, asparagus, and potatoes. Juices, such as orange, tomato, and prune. Lean meats, including fish. Milk and milk products, such as yogurt. General instructions Take over-the-counter and prescription medicines only as told by your health care provider. This includes vitamins, natural food products, and supplements. Keep all follow-up visits. This is important. Contact a health care provider if: You have weakness that gets worse. You feel your heart pounding or racing. You vomit. You have diarrhea. You have diabetes and you have trouble keeping your blood sugar in your target range. Get help right away if: You have chest pain. You have shortness of breath. You have vomiting or diarrhea that lasts for more than 2 days. You faint. These symptoms may be an emergency. Get help right away. Call 911. Do not wait to see if the  symptoms will go away. Do not drive yourself to the hospital. Summary Hypokalemia means that the amount of potassium in the blood is lower than normal. This condition is diagnosed with a blood test. Hypokalemia may be treated by taking potassium supplements, adjusting the medicines that you take, or eating more foods that are high in potassium. If your potassium level is very low, you may need to get potassium through an IV and be monitored in the hospital. This information is not intended to replace advice given to you by your health care provider. Make sure you discuss any questions you have with your health care provider. Document Revised: 06/21/2021 Document Reviewed: 06/21/2021 Elsevier Patient Education  2024 [employer].

## 2024-06-18 ENCOUNTER — Other Ambulatory Visit: Payer: Self-pay

## 2024-06-22 ENCOUNTER — Other Ambulatory Visit: Payer: Self-pay

## 2024-06-22 ENCOUNTER — Other Ambulatory Visit (HOSPITAL_COMMUNITY): Payer: Self-pay

## 2024-06-22 ENCOUNTER — Telehealth: Payer: Self-pay | Admitting: *Deleted

## 2024-06-22 NOTE — Telephone Encounter (Signed)
 LMOM for the patient to call the office back. Per provider need to move patient's appt from 9/25 to another day.

## 2024-06-22 NOTE — Progress Notes (Signed)
 Specialty Pharmacy Refill Coordination Note  Linda [name] is a 58 y.o. female contacted today regarding refills of specialty medication(s) Capecitabine  (XELODA )   Patient requested Delivery   Delivery date: 07/13/24   Verified address: [address] [place], [place] [address]   Medication will be filled on 07/12/24.

## 2024-06-22 NOTE — Telephone Encounter (Signed)
 Linda [name] returned call from [facility] regarding moving upcoming appointment with Dr.Tucker for a colposcopy.   Appointment moved to 10/2 @ 8:00. Pt agreed to date/time

## 2024-06-23 ENCOUNTER — Other Ambulatory Visit: Payer: Self-pay | Admitting: Hematology

## 2024-06-23 ENCOUNTER — Other Ambulatory Visit (HOSPITAL_COMMUNITY): Payer: Self-pay

## 2024-06-23 ENCOUNTER — Encounter: Payer: Self-pay | Admitting: Hematology

## 2024-06-23 ENCOUNTER — Other Ambulatory Visit: Payer: Self-pay

## 2024-06-24 ENCOUNTER — Other Ambulatory Visit (HOSPITAL_COMMUNITY): Payer: Self-pay

## 2024-06-24 MED ORDER — POTASSIUM CHLORIDE CRYS ER 20 MEQ PO TBCR
20.0000 meq | EXTENDED_RELEASE_TABLET | Freq: Every day | ORAL | 0 refills | Status: DC
  Filled 2024-06-24 (×2): qty 30, 30d supply, fill #0

## 2024-06-25 ENCOUNTER — Other Ambulatory Visit: Payer: Self-pay

## 2024-06-25 ENCOUNTER — Other Ambulatory Visit: Payer: Self-pay | Admitting: Hematology

## 2024-06-25 ENCOUNTER — Other Ambulatory Visit (HOSPITAL_COMMUNITY): Payer: Self-pay

## 2024-06-25 MED ORDER — POTASSIUM CHLORIDE CRYS ER 20 MEQ PO TBCR
20.0000 meq | EXTENDED_RELEASE_TABLET | Freq: Three times a day (TID) | ORAL | 1 refills | Status: AC
  Filled 2024-06-25: qty 90, 30d supply, fill #0

## 2024-06-27 ENCOUNTER — Other Ambulatory Visit (HOSPITAL_COMMUNITY): Payer: Self-pay

## 2024-06-28 ENCOUNTER — Other Ambulatory Visit (HOSPITAL_COMMUNITY): Payer: Self-pay

## 2024-06-30 ENCOUNTER — Inpatient Hospital Stay

## 2024-06-30 ENCOUNTER — Inpatient Hospital Stay (HOSPITAL_BASED_OUTPATIENT_CLINIC_OR_DEPARTMENT_OTHER): Admitting: Hematology

## 2024-06-30 ENCOUNTER — Inpatient Hospital Stay: Attending: Hematology

## 2024-06-30 VITALS — BP 138/82 | HR 89 | Temp 98.0°F | Resp 19 | Ht 64.0 in | Wt 191.5 lb

## 2024-06-30 DIAGNOSIS — C787 Secondary malignant neoplasm of liver and intrahepatic bile duct: Secondary | ICD-10-CM | POA: Diagnosis present

## 2024-06-30 DIAGNOSIS — Z5111 Encounter for antineoplastic chemotherapy: Secondary | ICD-10-CM | POA: Diagnosis present

## 2024-06-30 DIAGNOSIS — C786 Secondary malignant neoplasm of retroperitoneum and peritoneum: Secondary | ICD-10-CM | POA: Insufficient documentation

## 2024-06-30 DIAGNOSIS — C172 Malignant neoplasm of ileum: Secondary | ICD-10-CM | POA: Insufficient documentation

## 2024-06-30 DIAGNOSIS — Z79899 Other long term (current) drug therapy: Secondary | ICD-10-CM | POA: Insufficient documentation

## 2024-06-30 LAB — CMP (CANCER CENTER ONLY)
ALT: 9 U/L (ref 0–44)
AST: 14 U/L — ABNORMAL LOW (ref 15–41)
Albumin: 3.8 g/dL (ref 3.5–5.0)
Alkaline Phosphatase: 93 U/L (ref 38–126)
Anion gap: 6 (ref 5–15)
BUN: 6 mg/dL (ref 6–20)
CO2: 27 mmol/L (ref 22–32)
Calcium: 9 mg/dL (ref 8.9–10.3)
Chloride: 106 mmol/L (ref 98–111)
Creatinine: 0.74 mg/dL (ref 0.44–1.00)
GFR, Estimated: >60 mL/min (ref >60)
Glucose, Bld: 100 mg/dL — ABNORMAL HIGH (ref 70–99)
Potassium: 3.3 mmol/L — ABNORMAL LOW (ref 3.5–5.1)
Sodium: 139 mmol/L (ref 135–145)
Total Bilirubin: 0.4 mg/dL (ref 0.0–1.2)
Total Protein: 7.8 g/dL (ref 6.5–8.1)

## 2024-06-30 LAB — CBC WITH DIFFERENTIAL (CANCER CENTER ONLY)
Abs Immature Granulocytes: 0.04 K/uL (ref 0.00–0.07)
Basophils Absolute: 0 K/uL (ref 0.0–0.1)
Basophils Relative: 0 %
Eosinophils Absolute: 0.4 K/uL (ref 0.0–0.5)
Eosinophils Relative: 4 %
HCT: 33.5 % — ABNORMAL LOW (ref 36.0–46.0)
Hemoglobin: 11 g/dL — ABNORMAL LOW (ref 12.0–15.0)
Immature Granulocytes: 0 %
Lymphocytes Relative: 20 %
Lymphs Abs: 1.9 K/uL (ref 0.7–4.0)
MCH: 28.6 pg (ref 26.0–34.0)
MCHC: 32.8 g/dL (ref 30.0–36.0)
MCV: 87.2 fL (ref 80.0–100.0)
Monocytes Absolute: 0.9 K/uL (ref 0.1–1.0)
Monocytes Relative: 10 %
Neutro Abs: 6.2 K/uL (ref 1.7–7.7)
Neutrophils Relative %: 66 %
Platelet Count: 403 K/uL — ABNORMAL HIGH (ref 150–400)
RBC: 3.84 MIL/uL — ABNORMAL LOW (ref 3.87–5.11)
RDW: 15.6 % — ABNORMAL HIGH (ref 11.5–15.5)
WBC Count: 9.4 K/uL (ref 4.0–10.5)
nRBC: 0 % (ref 0.0–0.2)

## 2024-06-30 MED ORDER — SODIUM CHLORIDE 0.9 % IV SOLN: INTRAVENOUS | Status: DC

## 2024-06-30 MED ORDER — ATROPINE SULFATE 1 MG/ML IV SOLN: 0.5000 mg | Freq: Once | INTRAVENOUS | Status: DC | PRN

## 2024-06-30 MED ORDER — SODIUM CHLORIDE 0.9 % IV SOLN
180.0000 mg/m2 | Freq: Once | INTRAVENOUS | Status: AC
  Administered 2024-06-30: 400 mg via INTRAVENOUS
  Filled 2024-06-30: qty 15

## 2024-06-30 MED ORDER — DEXAMETHASONE SODIUM PHOSPHATE 10 MG/ML IJ SOLN
10.0000 mg | Freq: Once | INTRAMUSCULAR | Status: AC
  Administered 2024-06-30: 10 mg via INTRAVENOUS
  Filled 2024-06-30: qty 1

## 2024-06-30 MED ORDER — PALONOSETRON HCL INJECTION 0.25 MG/5ML
0.2500 mg | Freq: Once | INTRAVENOUS | Status: AC
  Administered 2024-06-30: 0.25 mg via INTRAVENOUS
  Filled 2024-06-30: qty 5

## 2024-06-30 NOTE — Patient Instructions (Signed)
 [facility] - A DEPT OF [facility]  Discharge Instructions: Thank you for choosing [facility] to provide your oncology and hematology care.   If you have a lab appointment with the Cancer Center, please go directly to the Cancer Center and check in at the registration area.   Wear comfortable clothing and clothing appropriate for easy access to any Portacath or PICC line.   We strive to give you quality time with your provider. You may need to reschedule your appointment if you arrive late (15 or more minutes).  Arriving late affects you and other patients whose appointments are after yours.  Also, if you miss three or more appointments without notifying the office, you may be dismissed from the clinic at the provider's discretion.      For prescription refill requests, have your pharmacy contact our office and allow 72 hours for refills to be completed.    Today you received the following chemotherapy and/or immunotherapy agents:    To help prevent nausea and vomiting after your treatment, we encourage you to take your nausea medication as directed.  BELOW ARE SYMPTOMS THAT SHOULD BE REPORTED IMMEDIATELY: irinotecan  (CAMPTOSAR )  *FEVER GREATER THAN 100.4 F (38 C) OR HIGHER *CHILLS OR SWEATING *NAUSEA AND VOMITING THAT IS NOT CONTROLLED WITH YOUR NAUSEA MEDICATION *UNUSUAL SHORTNESS OF BREATH *UNUSUAL BRUISING OR BLEEDING *URINARY PROBLEMS (pain or burning when urinating, or frequent urination) *BOWEL PROBLEMS (unusual diarrhea, constipation, pain near the anus) TENDERNESS IN MOUTH AND THROAT WITH OR WITHOUT PRESENCE OF ULCERS (sore throat, sores in mouth, or a toothache) UNUSUAL RASH, SWELLING OR PAIN  UNUSUAL VAGINAL DISCHARGE OR ITCHING   Items with * indicate a potential emergency and should be followed up as soon as possible or go to the Emergency Department if any problems should occur.  Please show the CHEMOTHERAPY ALERT CARD or  IMMUNOTHERAPY ALERT CARD at check-in to the Emergency Department and triage nurse.  Should you have questions after your visit or need to cancel or reschedule your appointment, please contact [facility] - A DEPT OF [facility]  Dept: [phone]  and follow the prompts.  Office hours are 8:00 a.m. to 4:30 p.m. Monday - Friday. Please note that voicemails left after 4:00 p.m. may not be returned until the following business day.  We are closed weekends and major holidays. You have access to a nurse at all times for urgent questions. Please call the main number to the clinic Dept: [phone] and follow the prompts.   For any non-urgent questions, you may also contact your provider using MyChart. We now offer e-Visits for anyone 106 and older to request care online for non-urgent symptoms. For details visit [email or web address].   Also download the MyChart app! Go to the app store, search MyChart, open the app, select [place], and log in with your MyChart username and password.

## 2024-06-30 NOTE — Assessment & Plan Note (Signed)
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  [name] in the liver MRI also showed a 1.8 cm oligo met in segment 7 of liver. -We discussed the incurable nature of her metastatic disease, due to her metastasis in peritoneum and liver.  We also discussed the small possibility of HIPEC and liver directed therapy if she has excellent response to chemotherapy. -I recommend first-line chemotherapy FOLFOX.  She started on July 31, 2023, oxaliplatin  was held for first cycle due to open wound. -She developed seizure activity after first cycle of 5-FU infusion. She was evaluated by new oncologist Dr. Buckley, her seizure was felt to be unlikely related to 5-FU. She felt that seizure was coming after cycle 2 chemo when she coming in for pump d/c and required ativan . I changed her cehmo to CAPOX on 08/28/2023. She required dose reduction  -restaging CT 11/11/2023 showed mixed response with improved liver met and slightly worse peritoneal mets, I slightly increased her xeloda  dose  -restaging CT 02/04/2024 showed stable disease  -unfortunately scan 7/2/205 showed worsening peritoneal metastasis -chemo changed to irinotecan  and Xeloda  on 05/05/2024

## 2024-06-30 NOTE — Progress Notes (Signed)
 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  [name] in the liver MRI also showed a 1.8 cm oligo met in segment 7 of liver. -We discussed the incurable nature of her metastatic disease, due to her metastasis in peritoneum and liver.  We also discussed the small possibility of HIPEC and liver directed therapy if she has excellent response to chemotherapy. -I recommend first-line chemotherapy FOLFOX.  She started on July 31, 2023, oxaliplatin  was held for first cycle due to open wound. -She developed seizure activity after first cycle of 5-FU infusion. She was evaluated by new oncologist Dr. Buckley, her seizure was felt to be unlikely related to 5-FU. She felt that seizure was coming after cycle 2 chemo when she coming in for pump d/c and required ativan . I changed her cehmo to CAPOX on 08/28/2023. She required dose reduction  -restaging CT 11/11/2023 showed mixed response with improved liver met and slightly worse peritoneal mets, I slightly increased her xeloda  dose  -restaging CT 02/04/2024 showed stable disease  -unfortunately scan 7/2/205 showed worsening peritoneal metastasis -chemo changed to irinotecan  and Xeloda  on  05/05/2024    Assessment and Plan Assessment & Plan Metastatic small bowel cancer The current chemotherapy regimen is ongoing. No significant issues from the last cycle. Bowel movements are stable with mild diarrhea. Weight and blood pressure are normal. Hemoglobin is 11, and platelet count is 43. Tumor marker CEA decreased from 87 to 56, indicating a positive trend. She is tolerating chemotherapy well. - Continue chemotherapy with capecitabine , 7 days on and 7 days off. - Schedule chemotherapy sessions today at 2:30 PM and on October 8th. - Repeat imaging scan in October to assess treatment response. - Monitor tumor marker CEA for trends. - Evaluate kidney and liver function tests when available.  Hypokalemia Hypokalemia is managed with potassium supplementation. She is taking a 30-day supply of potassium three times a day. Insurance issues have been resolved. - Continue potassium supplementation three times a day. - Check potassium levels today to ensure improvement. - Recommend over-the-counter electrolyte solutions like Liquid IV for additional potassium, sodium, and magnesium supplementation.  Plan - She is tolerating treatment well, lab reviewed, adequate for treatment, will proceed to chemo today and continue every 2 weeks - Follow-up in 2 weeks   SUMMARY OF ONCOLOGIC HISTORY: Oncology History  Cancer of ileum with carcinomatosis  07/10/2023 Initial Diagnosis   Cancer of ileum with carcinomatosis   07/22/2023 Cancer Staging   Staging form: Small Intestine - Adenocarcinoma, AJCC 8th Edition - Pathologic: Stage IV (pT4, pNX, pM1) - Signed by Lanny Callander, MD on 07/22/2023 Histologic grade (G): G2 Histologic grading system: 4 grade system   07/31/2023 - 08/16/2023 Chemotherapy   Patient is on Treatment Plan : COLORECTAL FOLFOX q14d     08/28/2023 - 03/24/2024 Chemotherapy  Patient is on Treatment Plan : COLORECTAL CAPEOX (130/850) q21d x 8 cycles     05/05/2024 -  Chemotherapy    Patient is on Treatment Plan : COLORECTAL  Irinotecan  (200) D1 + Capecitabine  D1-7 (XELIRI) q14 Days        Discussed the use of AI scribe software for clinical note transcription with the patient, who gave verbal consent to proceed.  History of Present Illness KELIYAH SPILLMAN is a 58 year old female with metastatic cancer who presents for follow-up of her chemotherapy treatment.  She is on a capecitabine  regimen, taken seven days on and seven days off, without significant issues. She experiences mild diarrhea but maintains normal bowel movements, stable weight, and good appetite. Her tumor marker, CEA, has decreased from 87 two months ago to 56.  Her blood pressure is stable, and her hemoglobin level is 11. Platelet count is 43, with kidney and liver function tests pending. She takes potassium supplements three times a day and adheres to her blood pressure medication. She does not use tramadol  for nausea or pain and is no longer on seizure medication.  The last scan was in July.     All other systems were reviewed with the patient and are negative.  MEDICAL HISTORY:  Past Medical History:  Diagnosis Date   Abnormal uterine bleeding 04/10/2022   Anemia    Blood transfusion without reported diagnosis    Hypertension    Pre-diabetes 02/2022   Stomach cancer [facility])     SURGICAL HISTORY: Past Surgical History:  Procedure Laterality Date   BREAST LUMPECTOMY WITH RADIOACTIVE SEED LOCALIZATION Left 06/10/2022   Procedure: LEFT BREAST LUMPECTOMY WITH RADIOACTIVE SEED LOCALIZATION;  Surgeon: Curvin Deward MOULD, MD;  Location: [facility];  Service: General;  Laterality: Left;   CESAREAN SECTION     x4   COLON SURGERY     IR IMAGING GUIDED PORT INSERTION  07/08/2023   LAPAROTOMY N/A 06/30/2023   Procedure: EXPLORATORY LAPAROTOMY, SMALL BOWEL RESECTION, AND EXCISIONAL BIOPSIES OF MULTIPLE OMENTAL MASSES;  Surgeon: Signe Mitzie DELENA, MD;  Location: [facility];  Service: General;   Laterality: N/A;   TUBAL LIGATION      I have reviewed the social history and family history with the patient and they are unchanged from previous note.  ALLERGIES:  has no known allergies.  MEDICATIONS:  Current Outpatient Medications  Medication Sig Dispense Refill   acetaminophen  (TYLENOL ) 500 MG tablet Take 2 tablets (1,000 mg total) by mouth every 8 (eight) hours as needed for mild pain or fever.     atorvastatin  (LIPITOR) 10 MG tablet Take 1 tablet (10 mg total) by mouth daily. 90 tablet 2   capecitabine  (XELODA ) 500 MG tablet TAKE  2 tabs in morning and 3 tabs in evening, every 10-12 hours. Take within 30 minutes after meal. Take for 7 days on, then 7 days off, repeat every 14 days. 70 tablet 1   diclofenac  Sodium (VOLTAREN ) 1 % GEL Research Patient: Apply 0.5 grams (1 fingertip) to each hand and each foot twice daily for up to 12 weeks 400 g 0   Lacosamide  100 MG TABS Take 1 tablet (100 mg total) by mouth 2 (two) times daily. 60 tablet 3   LORazepam  (ATIVAN ) 1 MG tablet Take 1 tablet (1 mg total) by mouth every 6 (six) hours as needed for anxiety. 30 tablet 0   methocarbamol  (ROBAXIN ) 500 MG tablet Take 2 tablets (1,000 mg total) by mouth every 8 (eight) hours  as needed for muscle spasms. 30 tablet 0   nitroGLYCERIN  (NITROSTAT ) 0.4 MG SL tablet Place 1 tablet (0.4 mg total) under the tongue every 5 (five) minutes as needed for chest pain. 25 tablet 0   ondansetron  (ZOFRAN ) 8 MG tablet Take 1 tablet (8 mg total) by mouth every 8 (eight) hours as needed for nausea or vomiting. 30 tablet 2   polyethylene glycol powder (GLYCOLAX /MIRALAX ) 17 GM/SCOOP powder Take 17 g by mouth daily mixed in liquid as directed. 238 g 0   potassium chloride  SA (KLOR-CON  M) 20 MEQ tablet Take 1 tablet (20 mEq total) by mouth 3 (three) times daily. Do not crush or break-up 90 tablet 1   prochlorperazine  (COMPAZINE ) 10 MG tablet Take 1 tablet (10 mg total) by mouth every 6 (six) hours as needed for nausea or  vomiting. 30 tablet 2   simethicone  (MYLICON) 80 MG chewable tablet Chew 1 tablet (80 mg total) by mouth 4 (four) times daily as needed for flatulence. 100 tablet 0   traMADol  (ULTRAM ) 50 MG tablet Take 1 tablet (50 mg total) by mouth every 6 (six) hours as needed. 60 tablet 0   valsartan  (DIOVAN ) 40 MG tablet Take 1 tablet (40 mg total) by mouth daily. 90 tablet 2   No current facility-administered medications for this visit.   Facility-Administered Medications Ordered in Other Visits  Medication Dose Route Frequency Provider Last Rate Last Admin   0.9 %  sodium chloride  infusion   Intravenous Continuous Lanny Callander, MD   Stopped at 06/30/24 1712   atropine  injection 0.5 mg  0.5 mg Intravenous Once PRN Lanny Callander, MD        PHYSICAL EXAMINATION: ECOG PERFORMANCE STATUS: 1 - Symptomatic but completely ambulatory  Vitals:   06/30/24 1404 06/30/24 1405  BP: (!) 158/80 138/82  Pulse: 89   Resp: 19   Temp: 98 F (36.7 C)   SpO2: 97%    Wt Readings from Last 3 Encounters:  06/30/24 191 lb 8 oz (86.9 kg)  06/16/24 191 lb 6.4 oz (86.8 kg)  06/02/24 190 lb 12.8 oz (86.5 kg)     GENERAL:alert, no distress and comfortable SKIN: skin color, texture, turgor are normal, no rashes or significant lesions EYES: normal, Conjunctiva are pink and non-injected, sclera clear NECK: supple, thyroid  normal size, non-tender, without nodularity LYMPH:  no palpable lymphadenopathy in the cervical, axillary  LUNGS: clear to auscultation and percussion with normal breathing effort HEART: regular rate & rhythm and no murmurs and no lower extremity edema ABDOMEN:abdomen soft, non-tender and normal bowel sounds Musculoskeletal:no cyanosis of digits and no clubbing  NEURO: alert & oriented x 3 with fluent speech, no focal motor/sensory deficits  Physical Exam    LABORATORY DATA:  I have reviewed the data as listed    Latest Ref Rng & Units 06/30/2024    1:42 PM 06/16/2024    9:44 AM 06/02/2024   10:40  AM  CBC  WBC 4.0 - 10.5 K/uL 9.4  7.0  6.9   Hemoglobin 12.0 - 15.0 g/dL 88.9  88.8  88.5   Hematocrit 36.0 - 46.0 % 33.5  34.1  34.3   Platelets 150 - 400 K/uL 403  369  471         Latest Ref Rng & Units 06/30/2024    1:42 PM 06/16/2024   11:29 AM 06/16/2024    9:44 AM  CMP  Glucose 70 - 99 mg/dL 899  843    BUN 6 - 20  mg/dL 6  6    Creatinine 9.55 - 1.00 mg/dL 9.25  9.34    Sodium 864 - 145 mmol/L 139  138    Potassium 3.5 - 5.1 mmol/L 3.3  2.9    Chloride 98 - 111 mmol/L 106  105    CO2 22 - 32 mmol/L 27  25    Calcium  8.9 - 10.3 mg/dL 9.0  8.6    Total Protein 6.5 - 8.1 g/dL 7.8  7.2  7.1   Total Bilirubin 0.0 - 1.2 mg/dL 0.4  0.4  0.5   Alkaline Phos 38 - 126 U/L 93  82  101   AST 15 - 41 U/L 14  13  19    ALT 0 - 44 U/L 9  9  11        RADIOGRAPHIC STUDIES: I have personally reviewed the radiological images as listed and agreed with the findings in the report. No results found.    Orders Placed This Encounter  Procedures   CBC with Differential (Cancer Center Only)    Standing Status:   Future    Expected Date:   08/11/2024    Expiration Date:   08/11/2025   CMP (Cancer Center only)    Standing Status:   Future    Expected Date:   08/11/2024    Expiration Date:   08/11/2025   All questions were answered. The patient knows to call the clinic with any problems, questions or concerns. No barriers to learning was detected. The total time spent in the appointment was 25 minutes, including review of chart and various tests results, discussions about plan of care and coordination of care plan     Onita Mattock, MD 06/30/2024

## 2024-07-12 ENCOUNTER — Other Ambulatory Visit: Payer: Self-pay

## 2024-07-13 ENCOUNTER — Encounter: Payer: Self-pay | Admitting: Pharmacist

## 2024-07-13 NOTE — Assessment & Plan Note (Signed)
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  [name] in the liver MRI also showed a 1.8 cm oligo met in segment 7 of liver. -We discussed the incurable nature of her metastatic disease, due to her metastasis in peritoneum and liver.  We also discussed the small possibility of HIPEC and liver directed therapy if she has excellent response to chemotherapy. -I recommend first-line chemotherapy FOLFOX.  She started on July 31, 2023, oxaliplatin  was held for first cycle due to open wound. -She developed seizure activity after first cycle of 5-FU infusion. She was evaluated by new oncologist Dr. Buckley, her seizure was felt to be unlikely related to 5-FU. She felt that seizure was coming after cycle 2 chemo when she coming in for pump d/c and required ativan . I changed her cehmo to CAPOX on 08/28/2023. She required dose reduction  -restaging CT 11/11/2023 showed mixed response with improved liver met and slightly worse peritoneal mets, I slightly increased her xeloda  dose  -restaging CT 02/04/2024 showed stable disease  -unfortunately scan 7/2/205 showed worsening peritoneal metastasis -chemo changed to irinotecan  and Xeloda  on 05/05/2024

## 2024-07-13 NOTE — Progress Notes (Signed)
 error

## 2024-07-14 ENCOUNTER — Inpatient Hospital Stay

## 2024-07-14 ENCOUNTER — Other Ambulatory Visit: Payer: Self-pay | Admitting: *Deleted

## 2024-07-14 ENCOUNTER — Inpatient Hospital Stay (HOSPITAL_BASED_OUTPATIENT_CLINIC_OR_DEPARTMENT_OTHER): Admitting: Hematology

## 2024-07-14 ENCOUNTER — Inpatient Hospital Stay: Admitting: Dietician

## 2024-07-14 VITALS — BP 128/84 | HR 77 | Temp 97.9°F | Resp 16 | Ht 64.0 in | Wt 191.1 lb

## 2024-07-14 DIAGNOSIS — C172 Malignant neoplasm of ileum: Secondary | ICD-10-CM

## 2024-07-14 DIAGNOSIS — Z5111 Encounter for antineoplastic chemotherapy: Secondary | ICD-10-CM | POA: Diagnosis not present

## 2024-07-14 LAB — CBC WITH DIFFERENTIAL (CANCER CENTER ONLY)
Abs Immature Granulocytes: 0.06 K/uL (ref 0.00–0.07)
Basophils Absolute: 0 K/uL (ref 0.0–0.1)
Basophils Relative: 0 %
Eosinophils Absolute: 0.2 K/uL (ref 0.0–0.5)
Eosinophils Relative: 2 %
HCT: 33.4 % — ABNORMAL LOW (ref 36.0–46.0)
Hemoglobin: 10.9 g/dL — ABNORMAL LOW (ref 12.0–15.0)
Immature Granulocytes: 1 %
Lymphocytes Relative: 19 %
Lymphs Abs: 1.9 K/uL (ref 0.7–4.0)
MCH: 28.2 pg (ref 26.0–34.0)
MCHC: 32.6 g/dL (ref 30.0–36.0)
MCV: 86.3 fL (ref 80.0–100.0)
Monocytes Absolute: 0.8 K/uL (ref 0.1–1.0)
Monocytes Relative: 8 %
Neutro Abs: 7 K/uL (ref 1.7–7.7)
Neutrophils Relative %: 70 %
Platelet Count: 373 K/uL (ref 150–400)
RBC: 3.87 MIL/uL (ref 3.87–5.11)
RDW: 15.8 % — ABNORMAL HIGH (ref 11.5–15.5)
WBC Count: 9.9 K/uL (ref 4.0–10.5)
nRBC: 0 % (ref 0.0–0.2)

## 2024-07-14 LAB — CMP (CANCER CENTER ONLY)
ALT: 13 U/L (ref 0–44)
AST: 17 U/L (ref 15–41)
Albumin: 3.8 g/dL (ref 3.5–5.0)
Alkaline Phosphatase: 98 U/L (ref 38–126)
Anion gap: 5 (ref 5–15)
BUN: 6 mg/dL (ref 6–20)
CO2: 28 mmol/L (ref 22–32)
Calcium: 9.2 mg/dL (ref 8.9–10.3)
Chloride: 106 mmol/L (ref 98–111)
Creatinine: 0.68 mg/dL (ref 0.44–1.00)
GFR, Estimated: >60 mL/min (ref >60)
Glucose, Bld: 98 mg/dL (ref 70–99)
Potassium: 3.4 mmol/L — ABNORMAL LOW (ref 3.5–5.1)
Sodium: 139 mmol/L (ref 135–145)
Total Bilirubin: 0.4 mg/dL (ref 0.0–1.2)
Total Protein: 7.9 g/dL (ref 6.5–8.1)

## 2024-07-14 LAB — HEPATIC FUNCTION PANEL
ALT: 15 U/L (ref 0–44)
AST: 23 U/L (ref 15–41)
Albumin: 3.8 g/dL (ref 3.5–5.0)
Alkaline Phosphatase: 117 U/L (ref 38–126)
Bilirubin, Direct: 0.2 mg/dL (ref 0.0–0.2)
Indirect Bilirubin: 0.3 mg/dL (ref 0.3–0.9)
Total Bilirubin: 0.5 mg/dL (ref 0.0–1.2)
Total Protein: 7.2 g/dL (ref 6.5–8.1)

## 2024-07-14 LAB — CEA (ACCESS): CEA (CHCC): 38.37 ng/mL — ABNORMAL HIGH (ref 0.00–5.00)

## 2024-07-14 MED ORDER — PALONOSETRON HCL INJECTION 0.25 MG/5ML
0.2500 mg | Freq: Once | INTRAVENOUS | Status: AC
  Administered 2024-07-14: 0.25 mg via INTRAVENOUS
  Filled 2024-07-14: qty 5

## 2024-07-14 MED ORDER — SODIUM CHLORIDE 0.9 % IV SOLN: INTRAVENOUS | Status: DC

## 2024-07-14 MED ORDER — SODIUM CHLORIDE 0.9 % IV SOLN
180.0000 mg/m2 | Freq: Once | INTRAVENOUS | Status: AC
  Administered 2024-07-14: 400 mg via INTRAVENOUS
  Filled 2024-07-14: qty 15

## 2024-07-14 MED ORDER — DEXAMETHASONE SODIUM PHOSPHATE 10 MG/ML IJ SOLN
10.0000 mg | Freq: Once | INTRAMUSCULAR | Status: AC
  Administered 2024-07-14: 10 mg via INTRAVENOUS
  Filled 2024-07-14: qty 1

## 2024-07-14 NOTE — Progress Notes (Signed)
 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  [name] in the liver MRI also showed a 1.8 cm oligo met in segment 7 of liver. -We discussed the incurable nature of her metastatic disease, due to her metastasis in peritoneum and liver.  We also discussed the small possibility of HIPEC and liver directed therapy if she has excellent response to chemotherapy. -I recommend first-line chemotherapy FOLFOX.  She started on July 31, 2023, oxaliplatin  was held for first cycle due to open wound. -She developed seizure activity after first cycle of 5-FU infusion. She was evaluated by new oncologist Dr. Buckley, her seizure was felt to be unlikely related to 5-FU. She felt that seizure was coming after cycle 2 chemo when she coming in for pump d/c and required ativan . I changed her cehmo to CAPOX on 08/28/2023. She required dose reduction  -restaging CT 11/11/2023 showed mixed response with improved liver met and slightly worse peritoneal mets, I slightly increased her xeloda  dose  -restaging CT 02/04/2024 showed stable disease  -unfortunately scan 7/2/205 showed worsening peritoneal metastasis -chemo changed to irinotecan  and Xeloda  on  05/05/2024  Assessment & Plan Metastatic small bowel cancer (ileum) Undergoing treatment for metastatic small bowel cancer in the ileum. Currently on the sixth cycle of chemotherapy, tolerating well with stable weight and no significant side effects such as diarrhea. Experiences fatigue for three to four days post-treatment but remains functional. Tumor markers have decreased to 56 from a high of 87, indicating a positive response to treatment. - Order CT scan in approximately three weeks to assess treatment response. - Continue current chemotherapy regimen. - Schedule next chemotherapy cycle and a couple more in advance.  Anemia secondary to chemotherapy Mild anemia likely secondary to chemotherapy. Blood counts are stable and she is tolerating the anemia well without significant symptoms.  Plan - Patient is clinically doing well, and tolerating treatment well. - Lab reviewed, adequate for treatment, will proceed with chemotherapy today - Restaging CT scan in 3 weeks - Follow-up in 2 weeks   SUMMARY OF ONCOLOGIC HISTORY: Oncology History  Cancer of ileum with carcinomatosis  07/10/2023 Initial Diagnosis   Cancer of ileum with carcinomatosis   07/22/2023 Cancer Staging   Staging form: Small Intestine - Adenocarcinoma, AJCC 8th Edition - Pathologic: Stage IV (pT4, pNX, pM1) - Signed by Lanny Callander, MD on 07/22/2023 Histologic grade (G): G2 Histologic grading system: 4 grade system   07/31/2023 - 08/16/2023 Chemotherapy   Patient is on Treatment Plan : COLORECTAL FOLFOX q14d     08/28/2023 - 03/24/2024 Chemotherapy   Patient is on Treatment Plan : COLORECTAL CAPEOX (130/850) q21d x 8 cycles     05/05/2024 -  Chemotherapy   Patient is on Treatment Plan : COLORECTAL  Irinotecan  (200) D1 + Capecitabine  D1-7 (XELIRI) q14 Days  Discussed the use of AI scribe software for clinical note transcription with the patient, who gave verbal consent to proceed.  History of Present  Illness Linda [name] is a 58 year old female with metastatic small bowel cancer who presents for follow-up.  She is undergoing her sixth cycle of chemotherapy and experiences fatigue for three to four days post-treatment but remains functional. Her weight is stable at 191 pounds, and she maintains her energy levels, performing daily activities and self-care without new symptoms. Blood counts indicate mild anemia. Tumor marker levels have decreased from a high of 87 to 56. She recently refilled her oral chemotherapy medication and does not require additional refills.     All other systems were reviewed with the patient and are negative.  MEDICAL HISTORY:  Past Medical History:  Diagnosis Date   Abnormal uterine bleeding 04/10/2022   Anemia    Blood transfusion without reported diagnosis    Hypertension    Pre-diabetes 02/2022   Stomach cancer [facility])     SURGICAL HISTORY: Past Surgical History:  Procedure Laterality Date   BREAST LUMPECTOMY WITH RADIOACTIVE SEED LOCALIZATION Left 06/10/2022   Procedure: LEFT BREAST LUMPECTOMY WITH RADIOACTIVE SEED LOCALIZATION;  Surgeon: Curvin Deward MOULD, MD;  Location: [facility];  Service: General;  Laterality: Left;   CESAREAN SECTION     x4   COLON SURGERY     IR IMAGING GUIDED PORT INSERTION  07/08/2023   LAPAROTOMY N/A 06/30/2023   Procedure: EXPLORATORY LAPAROTOMY, SMALL BOWEL RESECTION, AND EXCISIONAL BIOPSIES OF MULTIPLE OMENTAL MASSES;  Surgeon: Signe Mitzie DELENA, MD;  Location: [facility];  Service: General;  Laterality: N/A;   TUBAL LIGATION      I have reviewed the social history and family history with the patient and they are unchanged from previous note.  ALLERGIES:  has no known allergies.  MEDICATIONS:  Current Outpatient Medications  Medication Sig Dispense Refill   acetaminophen  (TYLENOL ) 500 MG tablet Take 2 tablets (1,000 mg total) by mouth every 8 (eight) hours as needed for mild pain or fever.      atorvastatin  (LIPITOR) 10 MG tablet Take 1 tablet (10 mg total) by mouth daily. 90 tablet 2   capecitabine  (XELODA ) 500 MG tablet TAKE  2 tabs in morning and 3 tabs in evening, every 10-12 hours. Take within 30 minutes after meal. Take for 7 days on, then 7 days off, repeat every 14 days. 70 tablet 1   diclofenac  Sodium (VOLTAREN ) 1 % GEL Research Patient: Apply 0.5 grams (1 fingertip) to each hand and each foot twice daily for up to 12 weeks 400 g 0   Lacosamide  100 MG TABS Take 1 tablet (100 mg total) by mouth 2 (two) times daily. 60 tablet 3   LORazepam  (ATIVAN ) 1 MG tablet Take 1 tablet (1 mg total) by mouth every 6 (six) hours as needed for anxiety. 30 tablet 0   methocarbamol  (ROBAXIN ) 500 MG tablet Take 2 tablets (1,000 mg total) by mouth every 8 (eight) hours as needed for muscle spasms. 30 tablet 0   nitroGLYCERIN  (NITROSTAT ) 0.4 MG SL tablet Place 1 tablet (0.4 mg total) under the tongue every 5 (five) minutes as needed for chest pain. 25 tablet 0   ondansetron  (ZOFRAN ) 8 MG tablet Take 1 tablet (8 mg total) by mouth every 8 (eight) hours as needed for nausea or vomiting. 30 tablet 2   polyethylene glycol powder (GLYCOLAX /MIRALAX ) 17 GM/SCOOP powder Take 17 g by mouth daily mixed in liquid  as directed. 238 g 0   potassium chloride  SA (KLOR-CON  M) 20 MEQ tablet Take 1 tablet (20 mEq total) by mouth 3 (three) times daily. Do not crush or break-up 90 tablet 1   prochlorperazine  (COMPAZINE ) 10 MG tablet Take 1 tablet (10 mg total) by mouth every 6 (six) hours as needed for nausea or vomiting. 30 tablet 2   simethicone  (MYLICON) 80 MG chewable tablet Chew 1 tablet (80 mg total) by mouth 4 (four) times daily as needed for flatulence. 100 tablet 0   traMADol  (ULTRAM ) 50 MG tablet Take 1 tablet (50 mg total) by mouth every 6 (six) hours as needed. 60 tablet 0   valsartan  (DIOVAN ) 40 MG tablet Take 1 tablet (40 mg total) by mouth daily. 90 tablet 2   No current facility-administered medications for  this visit.    PHYSICAL EXAMINATION: ECOG PERFORMANCE STATUS: 1 - Symptomatic but completely ambulatory  Vitals:   07/14/24 1037  BP: 128/84  Pulse: 77  Resp: 16  Temp: 97.9 F (36.6 C)  SpO2: 98%   Wt Readings from Last 3 Encounters:  07/14/24 191 lb 1.6 oz (86.7 kg)  06/30/24 191 lb 8 oz (86.9 kg)  06/16/24 191 lb 6.4 oz (86.8 kg)     GENERAL:alert, no distress and comfortable SKIN: skin color, texture, turgor are normal, no rashes or significant lesions EYES: normal, Conjunctiva are pink and non-injected, sclera clear NECK: supple, thyroid  normal size, non-tender, without nodularity LYMPH:  no palpable lymphadenopathy in the cervical, axillary  LUNGS: clear to auscultation and percussion with normal breathing effort HEART: regular rate & rhythm and no murmurs and no lower extremity edema ABDOMEN:abdomen soft, non-tender and normal bowel sounds Musculoskeletal:no cyanosis of digits and no clubbing  NEURO: alert & oriented x 3 with fluent speech, no focal motor/sensory deficits  Physical Exam MEASUREMENTS: Weight- 191. CARDIOVASCULAR: Heart sounds normal  LABORATORY DATA:  I have reviewed the data as listed    Latest Ref Rng & Units 07/14/2024   10:13 AM 06/30/2024    1:42 PM 06/16/2024    9:44 AM  CBC  WBC 4.0 - 10.5 K/uL 9.9  9.4  7.0   Hemoglobin 12.0 - 15.0 g/dL 89.0  88.9  88.8   Hematocrit 36.0 - 46.0 % 33.4  33.5  34.1   Platelets 150 - 400 K/uL 373  403  369         Latest Ref Rng & Units 06/30/2024    1:42 PM 06/16/2024   11:29 AM 06/16/2024    9:44 AM  CMP  Glucose 70 - 99 mg/dL 899  843    BUN 6 - 20 mg/dL 6  6    Creatinine 9.55 - 1.00 mg/dL 9.25  9.34    Sodium 864 - 145 mmol/L 139  138    Potassium 3.5 - 5.1 mmol/L 3.3  2.9    Chloride 98 - 111 mmol/L 106  105    CO2 22 - 32 mmol/L 27  25    Calcium  8.9 - 10.3 mg/dL 9.0  8.6    Total Protein 6.5 - 8.1 g/dL 7.8  7.2  7.1   Total Bilirubin 0.0 - 1.2 mg/dL 0.4  0.4  0.5   Alkaline Phos 38 - 126  U/L 93  82  101   AST 15 - 41 U/L 14  13  19    ALT 0 - 44 U/L 9  9  11        RADIOGRAPHIC STUDIES: I have  personally reviewed the radiological images as listed and agreed with the findings in the report. No results found.    Orders Placed This Encounter  Procedures   CT CHEST ABDOMEN PELVIS W CONTRAST    Standing Status:   Future    Expected Date:   08/04/2024    Expiration Date:   07/14/2025    If indicated for the ordered procedure, I authorize the administration of contrast media per Radiology protocol:   Yes    Does the patient have a contrast media/X-ray dye allergy?:   No    Preferred imaging location?:   [facility]    If indicated for the ordered procedure, I authorize the administration of oral contrast media per Radiology protocol:   Yes   CBC with Differential (Cancer Center Only)    Standing Status:   Future    Expected Date:   08/25/2024    Expiration Date:   08/25/2025   CMP (Cancer Center only)    Standing Status:   Future    Expected Date:   08/25/2024    Expiration Date:   08/25/2025   All questions were answered. The patient knows to call the clinic with any problems, questions or concerns. No barriers to learning was detected. The total time spent in the appointment was 25 minutes, including review of chart and various tests results, discussions about plan of care and coordination of care plan     Onita Mattock, MD 07/14/2024

## 2024-07-14 NOTE — Progress Notes (Signed)
 Nutrition Follow-up:  Last seen by nutrition 10/09/23  Patient with cancer of ileum with carcinomatosis. She is receiving irinotecan  and capecitabine  q2w. Patient is under the care of Dr. Lanny   Met with patient in infusion. She reports doing well overall. Appetite is okay. Eating smaller meals and snacks. She is drinking lots of water. Taking miralax  as needed if no BM for 2 days. States current therapy keeps her pretty well balanced. Patient denies nausea.    Medications: reviewed   Labs: K 3.4  Anthropometrics: Wt 191 lb 1.6 oz today - stable   8/27 - 191 lb 6.4 oz 8/7 - 191 lb 6.4 oz  7/22 - 193 lb 6 oz    NUTRITION DIAGNOSIS: Increased calorie and protein energy needs resolved    INTERVENTION:  Continue small frequent meals/snacks and including good sources of protein     MONITORING, EVALUATION, GOAL: wt trends, intake    NEXT VISIT: To be scheduled as needed

## 2024-07-14 NOTE — Patient Instructions (Signed)
 [facility] - A DEPT OF [place]  Discharge Instructions: Thank you for choosing [facility] to provide your oncology and hematology care.   If you have a lab appointment with the Cancer Center, please go directly to the Cancer Center and check in at the registration area.   Wear comfortable clothing and clothing appropriate for easy access to any Portacath or PICC line.   We strive to give you quality time with your provider. You may need to reschedule your appointment if you arrive late (15 or more minutes).  Arriving late affects you and other patients whose appointments are after yours.  Also, if you miss three or more appointments without notifying the office, you may be dismissed from the clinic at the provider's discretion.      For prescription refill requests, have your pharmacy contact our office and allow 72 hours for refills to be completed.    Today you received the following chemotherapy and/or immunotherapy agents: irinotecan  (CAMPTOSAR )     To help prevent nausea and vomiting after your treatment, we encourage you to take your nausea medication as directed.  BELOW ARE SYMPTOMS THAT SHOULD BE REPORTED IMMEDIATELY: *FEVER GREATER THAN 100.4 F (38 C) OR HIGHER *CHILLS OR SWEATING *NAUSEA AND VOMITING THAT IS NOT CONTROLLED WITH YOUR NAUSEA MEDICATION *UNUSUAL SHORTNESS OF BREATH *UNUSUAL BRUISING OR BLEEDING *URINARY PROBLEMS (pain or burning when urinating, or frequent urination) *BOWEL PROBLEMS (unusual diarrhea, constipation, pain near the anus) TENDERNESS IN MOUTH AND THROAT WITH OR WITHOUT PRESENCE OF ULCERS (sore throat, sores in mouth, or a toothache) UNUSUAL RASH, SWELLING OR PAIN  UNUSUAL VAGINAL DISCHARGE OR ITCHING   Items with * indicate a potential emergency and should be followed up as soon as possible or go to the Emergency Department if any problems should occur.  Please show the CHEMOTHERAPY ALERT CARD or  IMMUNOTHERAPY ALERT CARD at check-in to the Emergency Department and triage nurse.  Should you have questions after your visit or need to cancel or reschedule your appointment, please contact [facility] - [facility]  Dept: [phone]  and follow the prompts.  Office hours are 8:00 a.m. to 4:30 p.m. Monday - Friday. Please note that voicemails left after 4:00 p.m. may not be returned until the following business day.  We are closed weekends and major holidays. You have access to a nurse at all times for urgent questions. Please call the main number to the clinic Dept: [phone] and follow the prompts.   For any non-urgent questions, you may also contact your provider using MyChart. We now offer e-Visits for anyone 27 and older to request care online for non-urgent symptoms. For details visit [email or web address].   Also download the MyChart app! Go to the app store, search MyChart, open the app, select [place], and log in with your MyChart username and password.

## 2024-07-15 ENCOUNTER — Ambulatory Visit: Admitting: Gynecologic Oncology

## 2024-07-20 ENCOUNTER — Encounter: Payer: Self-pay | Admitting: Gynecologic Oncology

## 2024-07-22 ENCOUNTER — Inpatient Hospital Stay: Attending: Hematology | Admitting: Gynecologic Oncology

## 2024-07-22 ENCOUNTER — Encounter: Payer: Self-pay | Admitting: Gynecologic Oncology

## 2024-07-22 VITALS — BP 136/78 | HR 87 | Temp 99.2°F | Resp 16 | Wt 192.0 lb

## 2024-07-22 DIAGNOSIS — C172 Malignant neoplasm of ileum: Secondary | ICD-10-CM | POA: Diagnosis present

## 2024-07-22 DIAGNOSIS — Z5111 Encounter for antineoplastic chemotherapy: Secondary | ICD-10-CM | POA: Insufficient documentation

## 2024-07-22 DIAGNOSIS — C787 Secondary malignant neoplasm of liver and intrahepatic bile duct: Secondary | ICD-10-CM | POA: Diagnosis present

## 2024-07-22 DIAGNOSIS — C786 Secondary malignant neoplasm of retroperitoneum and peritoneum: Secondary | ICD-10-CM | POA: Diagnosis not present

## 2024-07-22 DIAGNOSIS — R87619 Unspecified abnormal cytological findings in specimens from cervix uteri: Secondary | ICD-10-CM | POA: Diagnosis not present

## 2024-07-22 DIAGNOSIS — G620 Drug-induced polyneuropathy: Secondary | ICD-10-CM | POA: Diagnosis not present

## 2024-07-22 DIAGNOSIS — R197 Diarrhea, unspecified: Secondary | ICD-10-CM | POA: Insufficient documentation

## 2024-07-22 DIAGNOSIS — N939 Abnormal uterine and vaginal bleeding, unspecified: Secondary | ICD-10-CM | POA: Diagnosis not present

## 2024-07-22 NOTE — Patient Instructions (Signed)
 It was good to see you today. I will let you know when I have biopsy results back.

## 2024-07-22 NOTE — Progress Notes (Signed)
 Gynecologic Oncology Return Clinic Visit  07/22/24  Reason for Visit: follow-up  Treatment History: Oncology History  Cancer of ileum with carcinomatosis  07/10/2023 Initial Diagnosis   Cancer of ileum with carcinomatosis   07/22/2023 Cancer Staging   Staging form: Small Intestine - Adenocarcinoma, AJCC 8th Edition - Pathologic: Stage IV (pT4, pNX, pM1) - Signed by Lanny Callander, MD on 07/22/2023 Histologic grade (G): G2 Histologic grading system: 4 grade system   07/31/2023 - 08/16/2023 Chemotherapy   Patient is on Treatment Plan : COLORECTAL FOLFOX q14d     08/28/2023 - 03/24/2024 Chemotherapy   Patient is on Treatment Plan : COLORECTAL CAPEOX (130/850) q21d x 8 cycles     05/05/2024 -  Chemotherapy   Patient is on Treatment Plan : COLORECTAL  Irinotecan  (200) D1 + Capecitabine  D1-7 (XELIRI) q14 Days      06/24/23: FNA peritoneal nodule Metastatic adenocaricnoma c/w colonic primary (CK20, CDX2 positive; 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 OMENTAL MASSES, EXCISION:  - Positive for adenocarcinoma  B. SOFT TISSUE, RIGHT LOWER QUADRANT MASS, EXCISION:  - Positive for adenocarcinoma  C. PORTION OF ILEUM, RESECTION:  - Adenocarcinoma, moderately differentiated, pT4  - No lymph nodes identified  - Margins negative for carcinoma  - See oncology table  D. ABDOMINAL WALL CYST, LEFT, EXCISION:  - Ovarian tissue involved by adenocarcinoma   8/7: FSH 21.7 LH 20 Estradiol  34.1  Pap 8/7 - ASCUS, HR HPV +  Interval History: Doing well.  Denies any further vaginal bleeding since I saw her 2 months ago.  Past Medical/Surgical History: Past Medical History:  Diagnosis Date   Abnormal  uterine bleeding 04/10/2022   Anemia    Blood transfusion without reported diagnosis    Hypertension    Pre-diabetes 02/2022   Stomach cancer [facility])     Past Surgical History:  Procedure Laterality Date   BREAST LUMPECTOMY WITH RADIOACTIVE SEED LOCALIZATION Left 06/10/2022   Procedure: LEFT BREAST LUMPECTOMY WITH RADIOACTIVE SEED LOCALIZATION;  Surgeon: Curvin Deward MOULD, MD;  Location: [facility];  Service: General;  Laterality: Left;   CESAREAN SECTION     x4   COLON SURGERY     IR IMAGING GUIDED PORT INSERTION  07/08/2023   LAPAROTOMY N/A 06/30/2023   Procedure: EXPLORATORY LAPAROTOMY, SMALL BOWEL RESECTION, AND EXCISIONAL BIOPSIES OF MULTIPLE OMENTAL MASSES;  Surgeon: Signe Mitzie LABOR, MD;  Location: [facility];  Service: General;  Laterality: N/A;   TUBAL LIGATION      Family History  Problem Relation Age of Onset   Memory loss Mother    Hypertension Father    Hypertension Brother    Diabetes Maternal Grandmother    Hypertension Maternal Grandmother    Hypertension Maternal Grandfather    Colon cancer Neg Hx    Stomach cancer Neg Hx    Esophageal cancer Neg Hx     Social History   Socioeconomic History   Marital status: Single    Spouse name: Not on file   Number of children: 4   Years of education: Not on file   Highest education level: Some college, no degree  Occupational History    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  Tobacco Use   Smoking status: Never   Smokeless tobacco: Never  Vaping Use   Vaping status: Never Used  Substance and Sexual Activity   Alcohol use: No   Drug use: No   Sexual activity: Not Currently    Birth control/protection: Surgical  Other Topics Concern   Not on file  Social History Narrative   Not on file   Social Drivers of Health   Financial Resource Strain: Medium Risk (10/29/2023)   Overall Financial Resource Strain (CARDIA)    Difficulty of Paying Living Expenses: Somewhat hard  Food Insecurity:  Patient Declined (10/29/2023)   Hunger Vital Sign    Worried About Running Out of Food in the Last Year: Patient declined    Ran Out of Food in the Last Year: Patient declined  Transportation Needs: No Transportation Needs (10/29/2023)   PRAPARE - Administrator, Civil Service (Medical): No    Lack of Transportation (Non-Medical): No  Physical Activity: Inactive (10/29/2023)   Exercise Vital Sign    Days of Exercise per Week: 0 days    Minutes of Exercise per Session: 0 min  Stress: No Stress Concern Present (10/29/2023)   [employer] of Occupational Health - Occupational Stress Questionnaire    Feeling of Stress : Only a little  Social Connections: Moderately Isolated (10/29/2023)   Social Connection and Isolation Panel    Frequency of Communication with Friends and Family: More than three times a week    Frequency of Social Gatherings with Friends and Family: More than three times a week    Attends Religious Services: 1 to 4 times per year    Active Member of [employer] or Organizations: No    Attends Engineer, structural: Not on file    Marital Status: Never married    Current Medications:  Current Outpatient Medications:    acetaminophen  (TYLENOL ) 500 MG tablet, Take 2 tablets (1,000 mg total) by mouth every 8 (eight) hours as needed for mild pain or fever., Disp: , Rfl:    atorvastatin  (LIPITOR) 10 MG tablet, Take 1 tablet (10 mg total) by mouth daily., Disp: 90 tablet, Rfl: 2   capecitabine  (XELODA ) 500 MG tablet, TAKE  2 tabs in morning and 3 tabs in evening, every 10-12 hours. Take within 30 minutes after meal. Take for 7 days on, then 7 days off, repeat every 14 days., Disp: 70 tablet, Rfl: 1   diclofenac  Sodium (VOLTAREN ) 1 % GEL, Research Patient: Apply 0.5 grams (1 fingertip) to each hand and each foot twice daily for up to 12 weeks, Disp: 400 g, Rfl: 0   nitroGLYCERIN  (NITROSTAT ) 0.4 MG SL tablet, Place 1 tablet (0.4 mg total) under the tongue every 5 (five)  minutes as needed for chest pain., Disp: 25 tablet, Rfl: 0   ondansetron  (ZOFRAN ) 8 MG tablet, Take 1 tablet (8 mg total) by mouth every 8 (eight) hours as needed for nausea or vomiting., Disp: 30 tablet, Rfl: 2   polyethylene glycol powder (GLYCOLAX /MIRALAX ) 17 GM/SCOOP powder, Take 17 g by mouth daily mixed in liquid as directed., Disp: 238 g, Rfl: 0   potassium chloride  SA (KLOR-CON  M) 20 MEQ tablet, Take 1 tablet (20 mEq total) by mouth 3 (three) times daily. Do not crush or break-up, Disp: 90 tablet, Rfl: 1   prochlorperazine  (COMPAZINE ) 10 MG tablet, Take 1 tablet (10 mg total) by mouth every 6 (six) hours as needed for nausea or vomiting., Disp: 30 tablet, Rfl: 2   simethicone  (MYLICON) 80 MG chewable tablet, Chew 1 tablet (80 mg total) by mouth  4 (four) times daily as needed for flatulence., Disp: 100 tablet, Rfl: 0   traMADol  (ULTRAM ) 50 MG tablet, Take 1 tablet (50 mg total) by mouth every 6 (six) hours as needed., Disp: 60 tablet, Rfl: 0   valsartan  (DIOVAN ) 40 MG tablet, Take 1 tablet (40 mg total) by mouth daily., Disp: 90 tablet, Rfl: 2  Review of Systems: Denies appetite changes, fevers, chills, fatigue, unexplained weight changes. Denies hearing loss, neck lumps or masses, mouth sores, ringing in ears or voice changes. Denies cough or wheezing.  Denies shortness of breath. Denies chest pain or palpitations. Denies leg swelling. Denies abdominal distention, pain, blood in stools, constipation, diarrhea, nausea, vomiting, or early satiety. Denies pain with intercourse, dysuria, frequency, hematuria or incontinence. Denies hot flashes, pelvic pain, vaginal bleeding or vaginal discharge.   Denies joint pain, back pain or muscle pain/cramps. Denies itching, rash, or wounds. Denies dizziness, headaches, numbness or seizures. Denies swollen lymph nodes or glands, denies easy bruising or bleeding. Denies anxiety, depression, confusion, or decreased concentration.  Physical Exam: BP  (!) 147/87 (BP Location: Left Arm, Patient Position: Sitting)   Pulse 87   Temp 99.2 F (37.3 C) (Oral)   Resp 16   Wt 192 lb (87.1 kg)   SpO2 100%   BMI 32.96 kg/m  General: Alert, oriented, no acute distress. HEENT: Posterior oropharynx clear, sclera anicteric. Chest: Unlabored breathing on room air.  GU: Normal appearing external genitalia without erythema, excoriation, or lesions.  Speculum exam reveals well-rugated vaginal mucosa.  Cervix is quite difficult to visualize secondary to mass effect from posterior uterine/cervical mass.  Bimanual exam reveals nodular fullness within the cul-de-sac.  Cervix is palpable, not firm.    ECC biopsy procedure Preoperative diagnosis: Abnormal pap Postoperative diagnosis: Same as above Physician: Viktoria MD Estimated blood loss: Minimal Specimens: Cervical curettage Procedure: After the procedure was discussed with the patient including risks and benefits, she gave verbal consent.  She was then placed in dorsolithotomy position and a speculum was placed in the vagina.  5% acetic acid was applied to what was able to be visualized of the cervix with no acetowhite changes noted.  Given inadequacy of the exam, ECC was attempted with a Cytobrush.  The cervix was then cleansed with Betadine and the speculum was removed.  ECC was passed over a finger to collect additional sample.  Both samples were placed in formalin.Overall the patient tolerated the procedure well.  All instruments were removed from the vagina.  Laboratory & Radiologic Studies: None new  Assessment & Plan: Linda [name] is a 58 y.o. woman with metastatic adenocarcinoma of the ileum.  Initially started on FOLFOX, transitioned to CAPOX after C2 due to seizure activity. Progressive disease noted in July 2025. Now on irinotecan  and Xeloda , C2D1 7/30.  Seen recently for vaginal bleeding, lab testing would indicated likely peri-menopausal.  Recent Pap test missed was ASCUS, high risk HPV  positive.  Her Pap in 2023 also showed high risk positive HPV with no follow-up at that time.  Colposcopy performed today with some difficulty given anatomy due to pelvic disease.  No obvious visible lesion vaginal walls or cervix.  ECC performed.  I will call the patient with these results.  Discussed that if no endocervical tissue noted, our options are for close surveillance versus attempt at biopsy in the OR under some anesthesia.  In the setting of her metastatic cancer with recent progression noted, I would favor close surveillance rather than an invasive procedure.  20 minutes of total time was spent for this patient encounter, including preparation, face-to-face counseling with the patient and coordination of care, and documentation of the encounter.  Comer Dollar, MD  Division of Gynecologic Oncology  Department of Obstetrics and Gynecology  [facility]

## 2024-07-23 LAB — SURGICAL PATHOLOGY

## 2024-07-26 ENCOUNTER — Ambulatory Visit: Payer: Self-pay | Admitting: Gynecologic Oncology

## 2024-07-27 NOTE — Assessment & Plan Note (Signed)
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  [name] in the liver MRI also showed a 1.8 cm oligo met in segment 7 of liver. -We discussed the incurable nature of her metastatic disease, due to her metastasis in peritoneum and liver.  We also discussed the small possibility of HIPEC and liver directed therapy if she has excellent response to chemotherapy. -I recommend first-line chemotherapy FOLFOX.  She started on July 31, 2023, oxaliplatin  was held for first cycle due to open wound. -She developed seizure activity after first cycle of 5-FU infusion. She was evaluated by new oncologist Dr. Buckley, her seizure was felt to be unlikely related to 5-FU. She felt that seizure was coming after cycle 2 chemo when she coming in for pump d/c and required ativan . I changed her cehmo to CAPOX on 08/28/2023. She required dose reduction  -restaging CT 11/11/2023 showed mixed response with improved liver met and slightly worse peritoneal mets, I slightly increased her xeloda  dose  -restaging CT 02/04/2024 showed stable disease  -unfortunately scan 7/2/205 showed worsening peritoneal metastasis -chemo changed to irinotecan  and Xeloda  on 05/05/2024

## 2024-07-28 ENCOUNTER — Inpatient Hospital Stay

## 2024-07-28 ENCOUNTER — Inpatient Hospital Stay (HOSPITAL_BASED_OUTPATIENT_CLINIC_OR_DEPARTMENT_OTHER): Admitting: Hematology

## 2024-07-28 VITALS — BP 140/78 | HR 67 | Temp 98.0°F | Resp 18 | Ht 64.0 in | Wt 193.2 lb

## 2024-07-28 DIAGNOSIS — C172 Malignant neoplasm of ileum: Secondary | ICD-10-CM

## 2024-07-28 DIAGNOSIS — Z5111 Encounter for antineoplastic chemotherapy: Secondary | ICD-10-CM | POA: Diagnosis not present

## 2024-07-28 LAB — CBC WITH DIFFERENTIAL (CANCER CENTER ONLY)
Abs Immature Granulocytes: 0.07 K/uL (ref 0.00–0.07)
Basophils Absolute: 0.1 K/uL (ref 0.0–0.1)
Basophils Relative: 1 %
Eosinophils Absolute: 0.3 K/uL (ref 0.0–0.5)
Eosinophils Relative: 4 %
HCT: 33.8 % — ABNORMAL LOW (ref 36.0–46.0)
Hemoglobin: 10.9 g/dL — ABNORMAL LOW (ref 12.0–15.0)
Immature Granulocytes: 1 %
Lymphocytes Relative: 26 %
Lymphs Abs: 1.9 K/uL (ref 0.7–4.0)
MCH: 28 pg (ref 26.0–34.0)
MCHC: 32.2 g/dL (ref 30.0–36.0)
MCV: 86.9 fL (ref 80.0–100.0)
Monocytes Absolute: 0.6 K/uL (ref 0.1–1.0)
Monocytes Relative: 8 %
Neutro Abs: 4.5 K/uL (ref 1.7–7.7)
Neutrophils Relative %: 60 %
Platelet Count: 375 K/uL (ref 150–400)
RBC: 3.89 MIL/uL (ref 3.87–5.11)
RDW: 16.3 % — ABNORMAL HIGH (ref 11.5–15.5)
WBC Count: 7.4 K/uL (ref 4.0–10.5)
nRBC: 0 % (ref 0.0–0.2)

## 2024-07-28 LAB — CMP (CANCER CENTER ONLY)
ALT: 9 U/L (ref 0–44)
AST: 13 U/L — ABNORMAL LOW (ref 15–41)
Albumin: 3.8 g/dL (ref 3.5–5.0)
Alkaline Phosphatase: 93 U/L (ref 38–126)
Anion gap: 5 (ref 5–15)
BUN: 7 mg/dL (ref 6–20)
CO2: 28 mmol/L (ref 22–32)
Calcium: 9.7 mg/dL (ref 8.9–10.3)
Chloride: 105 mmol/L (ref 98–111)
Creatinine: 0.63 mg/dL (ref 0.44–1.00)
GFR, Estimated: >60 mL/min (ref >60)
Glucose, Bld: 100 mg/dL — ABNORMAL HIGH (ref 70–99)
Potassium: 3.3 mmol/L — ABNORMAL LOW (ref 3.5–5.1)
Sodium: 138 mmol/L (ref 135–145)
Total Bilirubin: 0.4 mg/dL (ref 0.0–1.2)
Total Protein: 8 g/dL (ref 6.5–8.1)

## 2024-07-28 LAB — HEPATIC FUNCTION PANEL
ALT: 13 U/L (ref 0–44)
AST: 20 U/L (ref 15–41)
Albumin: 4 g/dL (ref 3.5–5.0)
Alkaline Phosphatase: 107 U/L (ref 38–126)
Bilirubin, Direct: 0.1 mg/dL (ref 0.0–0.2)
Indirect Bilirubin: 0.3 mg/dL (ref 0.3–0.9)
Total Bilirubin: 0.4 mg/dL (ref 0.0–1.2)
Total Protein: 7.2 g/dL (ref 6.5–8.1)

## 2024-07-28 MED ORDER — DEXAMETHASONE SODIUM PHOSPHATE 10 MG/ML IJ SOLN
10.0000 mg | Freq: Once | INTRAMUSCULAR | Status: AC
  Administered 2024-07-28: 10 mg via INTRAVENOUS
  Filled 2024-07-28: qty 1

## 2024-07-28 MED ORDER — PALONOSETRON HCL INJECTION 0.25 MG/5ML
0.2500 mg | Freq: Once | INTRAVENOUS | Status: AC
  Administered 2024-07-28: 0.25 mg via INTRAVENOUS
  Filled 2024-07-28: qty 5

## 2024-07-28 MED ORDER — ATROPINE SULFATE 1 MG/ML IV SOLN
0.5000 mg | Freq: Once | INTRAVENOUS | Status: AC | PRN
  Administered 2024-07-28: 0.5 mg via INTRAVENOUS
  Filled 2024-07-28: qty 1

## 2024-07-28 MED ORDER — SODIUM CHLORIDE 0.9 % IV SOLN: INTRAVENOUS | Status: DC

## 2024-07-28 MED ORDER — SODIUM CHLORIDE 0.9 % IV SOLN
180.0000 mg/m2 | Freq: Once | INTRAVENOUS | Status: AC
  Administered 2024-07-28: 400 mg via INTRAVENOUS
  Filled 2024-07-28: qty 15

## 2024-07-28 NOTE — Patient Instructions (Signed)
 [facility] - A DEPT OF [place]  Discharge Instructions: Thank you for choosing [facility] to provide your oncology and hematology care.   If you have a lab appointment with the Cancer Center, please go directly to the Cancer Center and check in at the registration area.   Wear comfortable clothing and clothing appropriate for easy access to any Portacath or PICC line.   We strive to give you quality time with your provider. You may need to reschedule your appointment if you arrive late (15 or more minutes).  Arriving late affects you and other patients whose appointments are after yours.  Also, if you miss three or more appointments without notifying the office, you may be dismissed from the clinic at the provider's discretion.      For prescription refill requests, have your pharmacy contact our office and allow 72 hours for refills to be completed.    Today you received the following chemotherapy and/or immunotherapy agents: Irinotecan  (Camptosar )      To help prevent nausea and vomiting after your treatment, we encourage you to take your nausea medication as directed.  BELOW ARE SYMPTOMS THAT SHOULD BE REPORTED IMMEDIATELY: *FEVER GREATER THAN 100.4 F (38 C) OR HIGHER *CHILLS OR SWEATING *NAUSEA AND VOMITING THAT IS NOT CONTROLLED WITH YOUR NAUSEA MEDICATION *UNUSUAL SHORTNESS OF BREATH *UNUSUAL BRUISING OR BLEEDING *URINARY PROBLEMS (pain or burning when urinating, or frequent urination) *BOWEL PROBLEMS (unusual diarrhea, constipation, pain near the anus) TENDERNESS IN MOUTH AND THROAT WITH OR WITHOUT PRESENCE OF ULCERS (sore throat, sores in mouth, or a toothache) UNUSUAL RASH, SWELLING OR PAIN  UNUSUAL VAGINAL DISCHARGE OR ITCHING   Items with * indicate a potential emergency and should be followed up as soon as possible or go to the Emergency Department if any problems should occur.  Please show the CHEMOTHERAPY ALERT CARD or  IMMUNOTHERAPY ALERT CARD at check-in to the Emergency Department and triage nurse.  Should you have questions after your visit or need to cancel or reschedule your appointment, please contact [facility]  Dept: [phone]  and follow the prompts.  Office hours are 8:00 a.m. to 4:30 p.m. Monday - Friday. Please note that voicemails left after 4:00 p.m. may not be returned until the following business day.  We are closed weekends and major holidays. You have access to a nurse at all times for urgent questions. Please call the main number to the clinic Dept: [phone] and follow the prompts.   For any non-urgent questions, you may also contact your provider using MyChart. We now offer e-Visits for anyone 72 and older to request care online for non-urgent symptoms. For details visit [email or web address].   Also download the MyChart app! Go to the app store, search MyChart, open the app, select [place], and log in with your MyChart username and password.

## 2024-07-28 NOTE — Progress Notes (Signed)
 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  [name] in the liver MRI also showed a 1.8 cm oligo met in segment 7 of liver. -We discussed the incurable nature of her metastatic disease, due to her metastasis in peritoneum and liver.  We also discussed the small possibility of HIPEC and liver directed therapy if she has excellent response to chemotherapy. -I recommend first-line chemotherapy FOLFOX.  She started on July 31, 2023, oxaliplatin  was held for first cycle due to open wound. -She developed seizure activity after first cycle of 5-FU infusion. She was evaluated by new oncologist Dr. Buckley, her seizure was felt to be unlikely related to 5-FU. She felt that seizure was coming after cycle 2 chemo when she coming in for pump d/c and required ativan . I changed her cehmo to CAPOX on 08/28/2023. 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 Tumor marker decreased from 87 in July to 56, indicating stable disease or slight improvement. Currently on capecitabine  regimen with no significant issues. Weight is stable with slight gain, and no significant low blood counts in previous labs. 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 - Advise wearing comfortable shoes for support  Chemotherapy-induced diarrhea Mild chemotherapy-induced diarrhea, manageable and not severe.  Plan - Patient is clinically doing well, tolerating treatment very well.  Will proceed to irinotecan  today and continue every 2 weeks - She will continue capecitabine  at home 1 week on and 1 week off - Follow-up in 2 weeks, staging CT scan scheduled for next week.   SUMMARY OF ONCOLOGIC HISTORY: Oncology History  Cancer of ileum with carcinomatosis  07/10/2023 Initial Diagnosis   Cancer of ileum with carcinomatosis   07/22/2023 Cancer Staging   Staging form: Small Intestine - Adenocarcinoma, AJCC 8th Edition - Pathologic: Stage IV (pT4, pNX, pM1) - Signed by Lanny Callander, MD on 07/22/2023 Histologic grade (G): G2 Histologic grading system: 4 grade system   07/31/2023 - 08/16/2023 Chemotherapy   Patient is on Treatment Plan : COLORECTAL FOLFOX q14d     08/28/2023 - 03/24/2024 Chemotherapy   Patient is on Treatment Plan : COLORECTAL CAPEOX (130/850) q21d x  8 cycles     05/05/2024 -  Chemotherapy   Patient is on Treatment Plan : COLORECTAL  Irinotecan  (200) D1 + Capecitabine  D1-7 (XELIRI)  q14 Days        Discussed the use of AI scribe software for clinical note transcription with the patient, who gave verbal consent to proceed.  History of Present Illness Linda [name] is a 58 year old female with metastatic small bowel cancer who presents for follow-up.  She is undergoing treatment with capecitabine  for metastatic small bowel cancer and has not experienced significant issues from recent chemotherapy sessions. Her weight is stable with a slight gain. Tumor marker levels have decreased from 87 in July to 56. She is scheduled for a scan next week.  She experiences numbness and tingling, particularly in her feet, which has worsened over the past few months. There are no balance issues or difficulty with stairs. In her hands, there is minimal tingling, mostly at the fingertips, which does not interfere with daily activities.  She experiences mild diarrhea.     All other systems were reviewed with the patient and are negative.  MEDICAL HISTORY:  Past Medical History:  Diagnosis Date   Abnormal uterine bleeding 04/10/2022   Anemia    Blood transfusion without reported diagnosis    Hypertension    Pre-diabetes 02/2022   Stomach cancer [facility])     SURGICAL HISTORY: Past Surgical History:  Procedure Laterality Date   BREAST LUMPECTOMY WITH RADIOACTIVE SEED LOCALIZATION Left 06/10/2022   Procedure: LEFT BREAST LUMPECTOMY WITH RADIOACTIVE SEED LOCALIZATION;  Surgeon: Curvin Deward MOULD, MD;  Location: [facility];  Service: General;  Laterality: Left;   CESAREAN SECTION     x4   COLON SURGERY     IR IMAGING GUIDED PORT INSERTION  07/08/2023   LAPAROTOMY N/A 06/30/2023   Procedure: EXPLORATORY LAPAROTOMY, SMALL BOWEL RESECTION, AND EXCISIONAL BIOPSIES OF MULTIPLE OMENTAL MASSES;  Surgeon: Signe Mitzie DELENA, MD;  Location: [facility];  Service: General;  Laterality: N/A;   TUBAL LIGATION      I have reviewed the social history and family history with the patient  and they are unchanged from previous note.  ALLERGIES:  has no known allergies.  MEDICATIONS:  Current Outpatient Medications  Medication Sig Dispense Refill   acetaminophen  (TYLENOL ) 500 MG tablet Take 2 tablets (1,000 mg total) by mouth every 8 (eight) hours as needed for mild pain or fever.     atorvastatin  (LIPITOR) 10 MG tablet Take 1 tablet (10 mg total) by mouth daily. 90 tablet 2   capecitabine  (XELODA ) 500 MG tablet TAKE  2 tabs in morning and 3 tabs in evening, every 10-12 hours. Take within 30 minutes after meal. Take for 7 days on, then 7 days off, repeat every 14 days. 70 tablet 1   diclofenac  Sodium (VOLTAREN ) 1 % GEL Research Patient: Apply 0.5 grams (1 fingertip) to each hand and each foot twice daily for up to 12 weeks 400 g 0   nitroGLYCERIN  (NITROSTAT ) 0.4 MG SL tablet Place 1 tablet (0.4 mg total) under the tongue every 5 (five) minutes as needed for chest pain. 25 tablet 0   ondansetron  (ZOFRAN ) 8 MG tablet Take 1 tablet (8 mg total) by mouth every 8 (eight) hours as needed for nausea or vomiting. 30 tablet 2   polyethylene glycol powder (GLYCOLAX /MIRALAX ) 17 GM/SCOOP powder Take 17 g by mouth daily mixed in liquid as directed. 238 g 0   potassium chloride  SA (KLOR-CON   M) 20 MEQ tablet Take 1 tablet (20 mEq total) by mouth 3 (three) times daily. Do not crush or break-up 90 tablet 1   prochlorperazine  (COMPAZINE ) 10 MG tablet Take 1 tablet (10 mg total) by mouth every 6 (six) hours as needed for nausea or vomiting. 30 tablet 2   simethicone  (MYLICON) 80 MG chewable tablet Chew 1 tablet (80 mg total) by mouth 4 (four) times daily as needed for flatulence. 100 tablet 0   traMADol  (ULTRAM ) 50 MG tablet Take 1 tablet (50 mg total) by mouth every 6 (six) hours as needed. 60 tablet 0   valsartan  (DIOVAN ) 40 MG tablet Take 1 tablet (40 mg total) by mouth daily. 90 tablet 2   No current facility-administered medications for this visit.   Facility-Administered Medications Ordered in  Other Visits  Medication Dose Route Frequency Provider Last Rate Last Admin   0.9 %  sodium chloride  infusion   Intravenous Continuous Lanny Callander, MD 10 mL/hr at 07/28/24 1132 New Bag at 07/28/24 1132   atropine  injection 0.5 mg  0.5 mg Intravenous Once PRN Lanny Callander, MD       irinotecan  (CAMPTOSAR ) 400 mg in sodium chloride  0.9 % 500 mL chemo infusion  180 mg/m2 (Treatment Plan Recorded) Intravenous Once Lanny Callander, MD        PHYSICAL EXAMINATION: ECOG PERFORMANCE STATUS: 1 - Symptomatic but completely ambulatory  Vitals:   07/28/24 1042 07/28/24 1045  BP: (!) 158/82 (!) 140/78  Pulse: 67   Resp: 18   Temp: 98 F (36.7 C)   SpO2: 99%    Wt Readings from Last 3 Encounters:  07/28/24 193 lb 3.2 oz (87.6 kg)  07/22/24 192 lb (87.1 kg)  07/14/24 191 lb 1.6 oz (86.7 kg)     GENERAL:alert, no distress and comfortable SKIN: skin color, texture, turgor are normal, no rashes or significant lesions EYES: normal, Conjunctiva are pink and non-injected, sclera clear NECK: supple, thyroid  normal size, non-tender, without nodularity LYMPH:  no palpable lymphadenopathy in the cervical, axillary  LUNGS: clear to auscultation and percussion with normal breathing effort HEART: regular rate & rhythm and no murmurs and no lower extremity edema ABDOMEN:abdomen soft, non-tender and normal bowel sounds Musculoskeletal:no cyanosis of digits and no clubbing  NEURO: alert & oriented x 3 with fluent speech, no focal motor/sensory deficits  Physical Exam ABDOMEN: Non-tender abdomen EXTREMITIES: No ankle swelling  LABORATORY DATA:  I have reviewed the data as listed    Latest Ref Rng & Units 07/28/2024   10:25 AM 07/14/2024   10:13 AM 06/30/2024    1:42 PM  CBC  WBC 4.0 - 10.5 K/uL 7.4  9.9  9.4   Hemoglobin 12.0 - 15.0 g/dL 89.0  89.0  88.9   Hematocrit 36.0 - 46.0 % 33.8  33.4  33.5   Platelets 150 - 400 K/uL 375  373  403         Latest Ref Rng & Units 07/28/2024   10:25 AM 07/14/2024    10:20 AM 07/14/2024   10:13 AM  CMP  Glucose 70 - 99 mg/dL 899   98   BUN 6 - 20 mg/dL 7   6   Creatinine 9.55 - 1.00 mg/dL 9.36   9.31   Sodium 864 - 145 mmol/L 138   139   Potassium 3.5 - 5.1 mmol/L 3.3   3.4   Chloride 98 - 111 mmol/L 105   106   CO2 22 - 32 mmol/L 28  28   Calcium  8.9 - 10.3 mg/dL 9.7   9.2   Total Protein 6.5 - 8.1 g/dL 8.0  7.2  7.9   Total Bilirubin 0.0 - 1.2 mg/dL 0.4  0.5  0.4   Alkaline Phos 38 - 126 U/L 93  117  98   AST 15 - 41 U/L 13  23  17    ALT 0 - 44 U/L 9  15  13        RADIOGRAPHIC STUDIES: I have personally reviewed the radiological images as listed and agreed with the findings in the report. No results found.    Orders Placed This Encounter  Procedures   CBC with Differential (Cancer Center Only)    Standing Status:   Future    Expected Date:   09/08/2024    Expiration Date:   09/08/2025   CMP (Cancer Center only)    Standing Status:   Future    Expected Date:   09/08/2024    Expiration Date:   09/08/2025   CBC with Differential (Cancer Center Only)    Standing Status:   Future    Expected Date:   09/22/2024    Expiration Date:   09/22/2025   CMP (Cancer Center only)    Standing Status:   Future    Expected Date:   09/22/2024    Expiration Date:   09/22/2025   All questions were answered. The patient knows to call the clinic with any problems, questions or concerns. No barriers to learning was detected. The total time spent in the appointment was 25 minutes, including review of chart and various tests results, discussions about plan of care and coordination of care plan     Onita Mattock, MD 07/28/2024

## 2024-07-29 ENCOUNTER — Other Ambulatory Visit: Payer: Self-pay

## 2024-07-30 ENCOUNTER — Other Ambulatory Visit: Payer: Self-pay

## 2024-08-02 ENCOUNTER — Other Ambulatory Visit: Payer: Self-pay

## 2024-08-04 ENCOUNTER — Other Ambulatory Visit (HOSPITAL_COMMUNITY): Payer: Self-pay

## 2024-08-04 ENCOUNTER — Ambulatory Visit (HOSPITAL_COMMUNITY)
Admission: RE | Admit: 2024-08-04 | Discharge: 2024-08-04 | Disposition: A | Discharge status: Home / Self Care | Source: Ambulatory Visit | Attending: Hematology | Admitting: Hematology

## 2024-08-04 DIAGNOSIS — C172 Malignant neoplasm of ileum: Secondary | ICD-10-CM | POA: Diagnosis present

## 2024-08-04 MED ORDER — IOHEXOL 300 MG/ML  SOLN
100.0000 mL | Freq: Once | INTRAMUSCULAR | Status: AC | PRN
  Administered 2024-08-04: 100 mL via INTRAVENOUS

## 2024-08-04 MED ORDER — HEPARIN SOD (PORK) LOCK FLUSH 100 UNIT/ML IV SOLN
500.0000 [IU] | Freq: Once | INTRAVENOUS | Status: AC
  Administered 2024-08-04: 500 [IU] via INTRAVENOUS

## 2024-08-04 MED ORDER — IOHEXOL 9 MG/ML PO SOLN
1000.0000 mL | ORAL | Status: AC
  Administered 2024-08-04: 1000 mL via ORAL

## 2024-08-04 MED ORDER — IOHEXOL 9 MG/ML PO SOLN
ORAL | Status: AC
  Filled 2024-08-04: qty 1000

## 2024-08-06 ENCOUNTER — Other Ambulatory Visit: Payer: Self-pay

## 2024-08-11 ENCOUNTER — Inpatient Hospital Stay

## 2024-08-11 ENCOUNTER — Inpatient Hospital Stay: Admitting: Nurse Practitioner

## 2024-08-18 ENCOUNTER — Encounter: Payer: Self-pay | Admitting: Hematology

## 2024-08-20 ENCOUNTER — Ambulatory Visit: Admitting: Gynecologic Oncology

## 2024-08-24 NOTE — Assessment & Plan Note (Signed)
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  [name] in the liver MRI also showed a 1.8 cm oligo met in segment 7 of liver. -We discussed the incurable nature of her metastatic disease, due to her metastasis in peritoneum and liver.  We also discussed the small possibility of HIPEC and liver directed therapy if she has excellent response to chemotherapy. -I recommend first-line chemotherapy FOLFOX.  She started on July 31, 2023, oxaliplatin  was held for first cycle due to open wound. -She developed seizure activity after first cycle of 5-FU infusion. She was evaluated by new oncologist Dr. Buckley, her seizure was felt to be unlikely related to 5-FU. She felt that seizure was coming after cycle 2 chemo when she coming in for pump d/c and required ativan . I changed her cehmo to CAPOX on 08/28/2023. She required dose reduction  -restaging CT 11/11/2023 showed mixed response with improved liver met and slightly worse peritoneal mets, I slightly increased her xeloda  dose  -restaging CT 02/04/2024 showed stable disease  -unfortunately scan 7/2/205 showed worsening peritoneal metastasis -chemo changed to irinotecan  and Xeloda  on 05/05/2024 -CT 08/04/2024 showed partial response

## 2024-08-24 NOTE — Progress Notes (Signed)
 Ok to change treatment plan date from 10/22 to 11/5 per MD.  Bridgett Leotis Helling, RPH, BCPS, BCOP 08/24/2024 2:13 PM

## 2024-08-25 ENCOUNTER — Inpatient Hospital Stay (HOSPITAL_BASED_OUTPATIENT_CLINIC_OR_DEPARTMENT_OTHER): Admitting: Hematology

## 2024-08-25 ENCOUNTER — Inpatient Hospital Stay

## 2024-08-25 ENCOUNTER — Other Ambulatory Visit: Payer: Self-pay

## 2024-08-25 ENCOUNTER — Inpatient Hospital Stay: Attending: Hematology

## 2024-08-25 ENCOUNTER — Other Ambulatory Visit (HOSPITAL_COMMUNITY): Payer: Self-pay

## 2024-08-25 VITALS — BP 130/70 | HR 82 | Temp 97.5°F | Resp 16

## 2024-08-25 VITALS — BP 180/100 | HR 88 | Temp 98.3°F | Resp 17 | Wt 199.6 lb

## 2024-08-25 DIAGNOSIS — C787 Secondary malignant neoplasm of liver and intrahepatic bile duct: Secondary | ICD-10-CM | POA: Diagnosis present

## 2024-08-25 DIAGNOSIS — Z79899 Other long term (current) drug therapy: Secondary | ICD-10-CM | POA: Diagnosis not present

## 2024-08-25 DIAGNOSIS — C172 Malignant neoplasm of ileum: Secondary | ICD-10-CM

## 2024-08-25 DIAGNOSIS — C786 Secondary malignant neoplasm of retroperitoneum and peritoneum: Secondary | ICD-10-CM | POA: Diagnosis not present

## 2024-08-25 DIAGNOSIS — Z5111 Encounter for antineoplastic chemotherapy: Secondary | ICD-10-CM | POA: Insufficient documentation

## 2024-08-25 LAB — CMP (CANCER CENTER ONLY)
ALT: 9 U/L (ref 0–44)
AST: 14 U/L — ABNORMAL LOW (ref 15–41)
Albumin: 3.9 g/dL (ref 3.5–5.0)
Alkaline Phosphatase: 91 U/L (ref 38–126)
Anion gap: 7 (ref 5–15)
BUN: 9 mg/dL (ref 6–20)
CO2: 27 mmol/L (ref 22–32)
Calcium: 9.3 mg/dL (ref 8.9–10.3)
Chloride: 103 mmol/L (ref 98–111)
Creatinine: 0.89 mg/dL (ref 0.44–1.00)
GFR, Estimated: >60 mL/min (ref >60)
Glucose, Bld: 159 mg/dL — ABNORMAL HIGH (ref 70–99)
Potassium: 3.5 mmol/L (ref 3.5–5.1)
Sodium: 137 mmol/L (ref 135–145)
Total Bilirubin: 0.4 mg/dL (ref 0.0–1.2)
Total Protein: 8.1 g/dL (ref 6.5–8.1)

## 2024-08-25 LAB — CBC WITH DIFFERENTIAL (CANCER CENTER ONLY)
Abs Immature Granulocytes: 0.07 K/uL (ref 0.00–0.07)
Basophils Absolute: 0 K/uL (ref 0.0–0.1)
Basophils Relative: 1 %
Eosinophils Absolute: 0.3 K/uL (ref 0.0–0.5)
Eosinophils Relative: 3 %
HCT: 37.9 % (ref 36.0–46.0)
Hemoglobin: 12.1 g/dL (ref 12.0–15.0)
Immature Granulocytes: 1 %
Lymphocytes Relative: 26 %
Lymphs Abs: 2.3 K/uL (ref 0.7–4.0)
MCH: 28.1 pg (ref 26.0–34.0)
MCHC: 31.9 g/dL (ref 30.0–36.0)
MCV: 88.1 fL (ref 80.0–100.0)
Monocytes Absolute: 0.6 K/uL (ref 0.1–1.0)
Monocytes Relative: 7 %
Neutro Abs: 5.4 K/uL (ref 1.7–7.7)
Neutrophils Relative %: 62 %
Platelet Count: 325 K/uL (ref 150–400)
RBC: 4.3 MIL/uL (ref 3.87–5.11)
RDW: 17.3 % — ABNORMAL HIGH (ref 11.5–15.5)
WBC Count: 8.6 K/uL (ref 4.0–10.5)
nRBC: 0 % (ref 0.0–0.2)

## 2024-08-25 LAB — CEA (ACCESS): CEA (CHCC): 32.41 ng/mL — ABNORMAL HIGH (ref 0.00–5.00)

## 2024-08-25 MED ORDER — DEXAMETHASONE SOD PHOSPHATE PF 10 MG/ML IJ SOLN
10.0000 mg | Freq: Once | INTRAMUSCULAR | Status: AC
  Administered 2024-08-25: 10 mg via INTRAVENOUS

## 2024-08-25 MED ORDER — LIDOCAINE-PRILOCAINE 2.5-2.5 % EX CREA
1.0000 | TOPICAL_CREAM | CUTANEOUS | 2 refills | Status: AC | PRN
  Filled 2024-08-25: qty 30, 30d supply, fill #0

## 2024-08-25 MED ORDER — ATROPINE SULFATE 1 MG/ML IV SOLN
0.5000 mg | Freq: Once | INTRAVENOUS | Status: AC | PRN
  Administered 2024-08-25: 0.5 mg via INTRAVENOUS
  Filled 2024-08-25: qty 1

## 2024-08-25 MED ORDER — SODIUM CHLORIDE 0.9 % IV SOLN: INTRAVENOUS | Status: DC

## 2024-08-25 MED ORDER — SODIUM CHLORIDE 0.9 % IV SOLN
180.0000 mg/m2 | Freq: Once | INTRAVENOUS | Status: AC
  Administered 2024-08-25: 400 mg via INTRAVENOUS
  Filled 2024-08-25: qty 15

## 2024-08-25 MED ORDER — PALONOSETRON HCL INJECTION 0.25 MG/5ML
0.2500 mg | Freq: Once | INTRAVENOUS | Status: AC
  Administered 2024-08-25: 0.25 mg via INTRAVENOUS
  Filled 2024-08-25: qty 5

## 2024-08-25 NOTE — Progress Notes (Signed)
 Blood pressure 148/82 now. Will proceed with treatment as planned.   Treatment given per orders. Patient tolerated it well without problems. Vitals stable and discharged home from clinic ambulatory. Follow up as scheduled.

## 2024-08-25 NOTE — Progress Notes (Signed)
 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  [name] in the liver MRI also showed a 1.8 cm oligo met in segment 7 of liver. -We discussed the incurable nature of her metastatic disease, due to her metastasis in peritoneum and liver.  We also discussed the small possibility of HIPEC and liver directed therapy if she has excellent response to chemotherapy. -I recommend first-line chemotherapy FOLFOX.  She started on July 31, 2023, oxaliplatin  was held for first cycle due to open wound. -She developed seizure activity after first cycle of 5-FU infusion. She was evaluated by new oncologist Dr. Buckley, her seizure was felt to be unlikely related to 5-FU. She felt that seizure was coming after cycle 2 chemo when she coming in for pump d/c and required ativan . I changed her cehmo to CAPOX on 08/28/2023. 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 Recent CT scan showed stable disease. Hemoglobin levels have improved after skipping a chemotherapy dose. Kidney and liver function tests, as well as tumor markers, are pending. The combination of intravenous and oral chemotherapy is preferred for better disease control, but adjustments can be made based on tolerance and upcoming events such as holidays and childbirth. - Continue current chemotherapy regimen with intravenous and oral components. - Adjusted chemotherapy schedule to accommodate holidays and childbirth: administered today, next on November 19th, then skip December 3rd, and continue on December 10th. - Consider reducing oral chemotherapy dosage if side effects become intolerable. - Refilled lidocaine  prescription.  Plan -Lab reviewed, adequate for treatment, will proceed irinotecan  today and continue every 2 weeks.  Due to the pending holiday, will change to every 3 weeks after next cycle -She will continue Xeloda  for now, but she may skip during the holidays -Up in 2 weeks Chemotherapy-induced neuropathy Neuropathy symptoms remain unchanged.  Chemotherapy-induced fatigue, appetite suppression, palpitations, and muscle spasms Fatigue, appetite suppression, palpitations, and muscle spasms are attributed to oral chemotherapy. Symptoms are significant. The combination of intravenous and oral chemotherapy is preferred for better disease control, but adjustments can be made based on tolerance and upcoming events such as holidays and childbirth. - Continue current chemotherapy regimen with intravenous and oral components. - Consider reducing oral chemotherapy dosage if side effects become intolerable.     SUMMARY OF ONCOLOGIC HISTORY: Oncology History  Cancer of ileum with carcinomatosis  07/10/2023 Initial Diagnosis   Cancer of ileum with carcinomatosis   07/22/2023 Cancer Staging    Staging form: Small Intestine - Adenocarcinoma, AJCC 8th  Edition - Pathologic: Stage IV (pT4, pNX, pM1) - Signed by Lanny Callander, MD on 07/22/2023 Histologic grade (G): G2 Histologic grading system: 4 grade system   07/31/2023 - 08/16/2023 Chemotherapy   Patient is on Treatment Plan : COLORECTAL FOLFOX q14d     08/28/2023 - 03/24/2024 Chemotherapy   Patient is on Treatment Plan : COLORECTAL CAPEOX (130/850) q21d x 8 cycles     05/05/2024 -  Chemotherapy   Patient is on Treatment Plan : COLORECTAL  Irinotecan  (200) D1 + Capecitabine  D1-7 (XELIRI) q14 Days        Discussed the use of AI scribe software for clinical note transcription with the patient, who gave verbal consent to proceed.  History of Present Illness Linda [name] is a 58 year old female with metastatic colon cancer who presents for follow-up.  She skipped her last chemotherapy session, resulting in improved chronic fatigue and appetite suppression. Hemoglobin levels increased from 10.9 to 12.1. She experiences stable neuropathy from treatment. She is on both intravenous and oral chemotherapy, having skipped both during the last cycle. Oral chemotherapy causes palpitations, lack of energy, and muscle spasms, but she wishes to continue it. A CT scan was performed three weeks ago.     All other systems were reviewed with the patient and are negative.  MEDICAL HISTORY:  Past Medical History:  Diagnosis Date   Abnormal uterine bleeding 04/10/2022   Anemia    Blood transfusion without reported diagnosis    Hypertension    Pre-diabetes 02/2022   Stomach cancer [facility])     SURGICAL HISTORY: Past Surgical History:  Procedure Laterality Date   BREAST LUMPECTOMY WITH RADIOACTIVE SEED LOCALIZATION Left 06/10/2022   Procedure: LEFT BREAST LUMPECTOMY WITH RADIOACTIVE SEED LOCALIZATION;  Surgeon: Curvin Deward MOULD, MD;  Location: [facility];  Service: General;  Laterality: Left;   CESAREAN SECTION     x4   COLON  SURGERY     IR IMAGING GUIDED PORT INSERTION  07/08/2023   LAPAROTOMY N/A 06/30/2023   Procedure: EXPLORATORY LAPAROTOMY, SMALL BOWEL RESECTION, AND EXCISIONAL BIOPSIES OF MULTIPLE OMENTAL MASSES;  Surgeon: Signe Mitzie DELENA, MD;  Location: [facility];  Service: General;  Laterality: N/A;   TUBAL LIGATION      I have reviewed the social history and family history with the patient and they are unchanged from previous note.  ALLERGIES:  has no known allergies.  MEDICATIONS:  Current Outpatient Medications  Medication Sig Dispense Refill   acetaminophen  (TYLENOL ) 500 MG tablet Take 2 tablets (1,000 mg total) by mouth every 8 (eight) hours as needed for mild pain or fever.     atorvastatin  (LIPITOR) 10 MG tablet Take 1 tablet (10 mg total) by mouth daily. 90 tablet 2   capecitabine  (XELODA ) 500 MG tablet TAKE  2 tabs in morning and 3 tabs in evening, every 10-12 hours. Take within 30 minutes after meal. Take for 7 days on, then 7 days off, repeat every 14 days. 70 tablet 1   diclofenac  Sodium (VOLTAREN ) 1 % GEL Research Patient: Apply 0.5 grams (1 fingertip) to each hand and each foot twice daily for up to 12 weeks 400 g 0   lidocaine -prilocaine  (EMLA ) cream Apply 1 Application topically as needed. 30 g 2   nitroGLYCERIN  (NITROSTAT ) 0.4 MG SL tablet Place 1 tablet (0.4 mg total) under the tongue every 5 (five) minutes as needed for chest pain. 25 tablet 0   ondansetron  (ZOFRAN ) 8 MG tablet Take 1 tablet (8 mg total)  by mouth every 8 (eight) hours as needed for nausea or vomiting. 30 tablet 2   polyethylene glycol powder (GLYCOLAX /MIRALAX ) 17 GM/SCOOP powder Take 17 g by mouth daily mixed in liquid as directed. 238 g 0   potassium chloride  SA (KLOR-CON  M) 20 MEQ tablet Take 1 tablet (20 mEq total) by mouth 3 (three) times daily. Do not crush or break-up 90 tablet 1   prochlorperazine  (COMPAZINE ) 10 MG tablet Take 1 tablet (10 mg total) by mouth every 6 (six) hours as needed for nausea or vomiting. 30  tablet 2   simethicone  (MYLICON) 80 MG chewable tablet Chew 1 tablet (80 mg total) by mouth 4 (four) times daily as needed for flatulence. 100 tablet 0   traMADol  (ULTRAM ) 50 MG tablet Take 1 tablet (50 mg total) by mouth every 6 (six) hours as needed. 60 tablet 0   valsartan  (DIOVAN ) 40 MG tablet Take 1 tablet (40 mg total) by mouth daily. 90 tablet 2   No current facility-administered medications for this visit.   Facility-Administered Medications Ordered in Other Visits  Medication Dose Route Frequency Provider Last Rate Last Admin   0.9 %  sodium chloride  infusion   Intravenous Continuous Lanny Callander, MD 10 mL/hr at 08/25/24 1000 New Bag at 08/25/24 1000   atropine  injection 0.5 mg  0.5 mg Intravenous Once PRN Lanny Callander, MD       irinotecan  (CAMPTOSAR ) 400 mg in sodium chloride  0.9 % 500 mL chemo infusion  180 mg/m2 (Treatment Plan Recorded) Intravenous Once Lanny Callander, MD        PHYSICAL EXAMINATION: ECOG PERFORMANCE STATUS: 1 - Symptomatic but completely ambulatory  Vitals:   08/25/24 0912 08/25/24 0916  BP: (!) 220/120 (!) 180/100  Pulse:  88  Resp:  17  Temp:  98.3 F (36.8 C)  SpO2:  98%   Wt Readings from Last 3 Encounters:  08/25/24 199 lb 9.6 oz (90.5 kg)  07/28/24 193 lb 3.2 oz (87.6 kg)  07/22/24 192 lb (87.1 kg)     GENERAL:alert, no distress and comfortable SKIN: skin color, texture, turgor are normal, no rashes or significant lesions EYES: normal, Conjunctiva are pink and non-injected, sclera clear NECK: supple, thyroid  normal size, non-tender, without nodularity LYMPH:  no palpable lymphadenopathy in the cervical, axillary  LUNGS: clear to auscultation and percussion with normal breathing effort HEART: regular rate & rhythm and no murmurs and no lower extremity edema ABDOMEN:abdomen soft, non-tender and normal bowel sounds Musculoskeletal:no cyanosis of digits and no clubbing  NEURO: alert & oriented x 3 with fluent speech, no focal motor/sensory  deficits  Physical Exam    LABORATORY DATA:  I have reviewed the data as listed    Latest Ref Rng & Units 08/25/2024    8:48 AM 07/28/2024   10:25 AM 07/14/2024   10:13 AM  CBC  WBC 4.0 - 10.5 K/uL 8.6  7.4  9.9   Hemoglobin 12.0 - 15.0 g/dL 87.8  89.0  89.0   Hematocrit 36.0 - 46.0 % 37.9  33.8  33.4   Platelets 150 - 400 K/uL 325  375  373         Latest Ref Rng & Units 08/25/2024    8:48 AM 07/28/2024   10:27 AM 07/28/2024   10:25 AM  CMP  Glucose 70 - 99 mg/dL 840   899   BUN 6 - 20 mg/dL 9   7   Creatinine 9.55 - 1.00 mg/dL 9.10   9.36   Sodium  135 - 145 mmol/L 137   138   Potassium 3.5 - 5.1 mmol/L 3.5   3.3   Chloride 98 - 111 mmol/L 103   105   CO2 22 - 32 mmol/L 27   28   Calcium  8.9 - 10.3 mg/dL 9.3   9.7   Total Protein 6.5 - 8.1 g/dL 8.1  7.2  8.0   Total Bilirubin 0.0 - 1.2 mg/dL 0.4  0.4  0.4   Alkaline Phos 38 - 126 U/L 91  107  93   AST 15 - 41 U/L 14  20  13    ALT 0 - 44 U/L 9  13  9        RADIOGRAPHIC STUDIES: I have personally reviewed the radiological images as listed and agreed with the findings in the report. No results found.    Orders Placed This Encounter  Procedures   CBC with Differential (Cancer Center Only)    Standing Status:   Future    Expected Date:   11/03/2024    Expiration Date:   11/03/2025   CMP (Cancer Center only)    Standing Status:   Future    Expected Date:   11/03/2024    Expiration Date:   11/03/2025   All questions were answered. The patient knows to call the clinic with any problems, questions or concerns. No barriers to learning was detected. The total time spent in the appointment was 30 minutes, including review of chart and various tests results, discussions about plan of care and coordination of care plan     Onita Mattock, MD 08/25/2024

## 2024-08-25 NOTE — Patient Instructions (Signed)
 [facility] - A DEPT OF [facility]  Discharge Instructions: Thank you for choosing [facility] to provide your oncology and hematology care.   If you have a lab appointment with the Cancer Center, please go directly to the Cancer Center and check in at the registration area.   Wear comfortable clothing and clothing appropriate for easy access to any Portacath or PICC line.   We strive to give you quality time with your provider. You may need to reschedule your appointment if you arrive late (15 or more minutes).  Arriving late affects you and other patients whose appointments are after yours.  Also, if you miss three or more appointments without notifying the office, you may be dismissed from the clinic at the provider's discretion.      For prescription refill requests, have your pharmacy contact our office and allow 72 hours for refills to be completed.    Today you received the following chemotherapy and/or immunotherapy agents irinotecan  infusion only    To help prevent nausea and vomiting after your treatment, we encourage you to take your nausea medication as directed.  BELOW ARE SYMPTOMS THAT SHOULD BE REPORTED IMMEDIATELY: *FEVER GREATER THAN 100.4 F (38 C) OR HIGHER *CHILLS OR SWEATING *NAUSEA AND VOMITING THAT IS NOT CONTROLLED WITH YOUR NAUSEA MEDICATION *UNUSUAL SHORTNESS OF BREATH *UNUSUAL BRUISING OR BLEEDING *URINARY PROBLEMS (pain or burning when urinating, or frequent urination) *BOWEL PROBLEMS (unusual diarrhea, constipation, pain near the anus) TENDERNESS IN MOUTH AND THROAT WITH OR WITHOUT PRESENCE OF ULCERS (sore throat, sores in mouth, or a toothache) UNUSUAL RASH, SWELLING OR PAIN  UNUSUAL VAGINAL DISCHARGE OR ITCHING   Items with * indicate a potential emergency and should be followed up as soon as possible or go to the Emergency Department if any problems should occur.  Please show the CHEMOTHERAPY ALERT CARD or  IMMUNOTHERAPY ALERT CARD at check-in to the Emergency Department and triage nurse.  Should you have questions after your visit or need to cancel or reschedule your appointment, please contact [facility] - A DEPT OF [facility]  Dept: [phone]  and follow the prompts.  Office hours are 8:00 a.m. to 4:30 p.m. Monday - Friday. Please note that voicemails left after 4:00 p.m. may not be returned until the following business day.  We are closed weekends and major holidays. You have access to a nurse at all times for urgent questions. Please call the main number to the clinic Dept: [phone] and follow the prompts.   For any non-urgent questions, you may also contact your provider using MyChart. We now offer e-Visits for anyone 19 and older to request care online for non-urgent symptoms. For details visit [email or web address].   Also download the MyChart app! Go to the app store, search MyChart, open the app, select [place], and log in with your MyChart username and password.

## 2024-08-29 ENCOUNTER — Other Ambulatory Visit: Payer: Self-pay

## 2024-09-02 ENCOUNTER — Other Ambulatory Visit: Payer: Self-pay

## 2024-09-03 ENCOUNTER — Telehealth: Payer: Self-pay | Admitting: Nurse Practitioner

## 2024-09-03 NOTE — Telephone Encounter (Signed)
 Called and LvM  regarding  up coming appts and times

## 2024-09-07 ENCOUNTER — Other Ambulatory Visit: Payer: Self-pay

## 2024-09-07 NOTE — Progress Notes (Signed)
 Specialty Pharmacy Refill Coordination Note  Linda [name] is a 58 y.o. female contacted today regarding refills of specialty medication(s) Capecitabine  (XELODA )   Patient requested Delivery   Delivery date: 09/22/24   Verified address: [address] [place], [place] [address]   Medication will be filled on: 09/21/24

## 2024-09-07 NOTE — Progress Notes (Signed)
 Specialty Pharmacy Ongoing Clinical Assessment Note  KAMIL HANIGAN is a 58 y.o. female who is being followed by the specialty pharmacy service for RxSp Oncology   Patient's specialty medication(s) reviewed today: Capecitabine  (XELODA )   Missed doses in the last 4 weeks: More than 5 (per patient, medication was on hold for about a month)   Patient/Caregiver did not have any additional questions or concerns.   Therapeutic benefit summary: Patient is achieving benefit   Adverse events/side effects summary: Experienced adverse events/side effects (Patient has decreased appetite and nausea/diarrhea, most likely attributed to both the IV chemotherapy and the Xeloda .  She has volteran gel on hand for her hands and feet, they are not bothering her at this time.)   Patient's therapy is appropriate to: Continue    Goals Addressed             This Visit's Progress    Slow Disease Progression   On track    Patient is on track. Patient will maintain adherence. Per Dr. Demetra office notes from 08/25/24, the CT from 08/04/2024 showed partial response.           Follow up: 6 months  Silvano LOISE Dolly Specialty Pharmacist

## 2024-09-07 NOTE — Assessment & Plan Note (Signed)
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  [name] in the liver MRI also showed a 1.8 cm oligo met in segment 7 of liver. -We discussed the incurable nature of her metastatic disease, due to her metastasis in peritoneum and liver.  We also discussed the small possibility of HIPEC and liver directed therapy if she has excellent response to chemotherapy. -I recommend first-line chemotherapy FOLFOX.  She started on July 31, 2023, oxaliplatin  was held for first cycle due to open wound. -She developed seizure activity after first cycle of 5-FU infusion. She was evaluated by new oncologist Dr. Buckley, her seizure was felt to be unlikely related to 5-FU. She felt that seizure was coming after cycle 2 chemo when she coming in for pump d/c and required ativan . I changed her cehmo to CAPOX on 08/28/2023. She required dose reduction  -restaging CT 11/11/2023 showed mixed response with improved liver met and slightly worse peritoneal mets, I slightly increased her xeloda  dose  -restaging CT 02/04/2024 showed stable disease  -unfortunately scan 7/2/205 showed worsening peritoneal metastasis -chemo changed to irinotecan  and Xeloda  on 05/05/2024 -CT 08/04/2024 showed partial response

## 2024-09-08 ENCOUNTER — Inpatient Hospital Stay

## 2024-09-08 ENCOUNTER — Inpatient Hospital Stay (HOSPITAL_BASED_OUTPATIENT_CLINIC_OR_DEPARTMENT_OTHER): Admitting: Hematology

## 2024-09-08 ENCOUNTER — Other Ambulatory Visit: Payer: Self-pay

## 2024-09-08 VITALS — BP 154/81 | HR 72 | Temp 97.9°F | Resp 16 | Ht 64.0 in | Wt 199.6 lb

## 2024-09-08 DIAGNOSIS — C172 Malignant neoplasm of ileum: Secondary | ICD-10-CM | POA: Diagnosis not present

## 2024-09-08 DIAGNOSIS — Z5111 Encounter for antineoplastic chemotherapy: Secondary | ICD-10-CM | POA: Diagnosis not present

## 2024-09-08 LAB — CBC WITH DIFFERENTIAL (CANCER CENTER ONLY)
Abs Immature Granulocytes: 0.02 K/uL (ref 0.00–0.07)
Basophils Absolute: 0 K/uL (ref 0.0–0.1)
Basophils Relative: 1 %
Eosinophils Absolute: 0.2 K/uL (ref 0.0–0.5)
Eosinophils Relative: 3 %
HCT: 35.4 % — ABNORMAL LOW (ref 36.0–46.0)
Hemoglobin: 11.4 g/dL — ABNORMAL LOW (ref 12.0–15.0)
Immature Granulocytes: 0 %
Lymphocytes Relative: 28 %
Lymphs Abs: 2.1 K/uL (ref 0.7–4.0)
MCH: 28 pg (ref 26.0–34.0)
MCHC: 32.2 g/dL (ref 30.0–36.0)
MCV: 87 fL (ref 80.0–100.0)
Monocytes Absolute: 0.5 K/uL (ref 0.1–1.0)
Monocytes Relative: 6 %
Neutro Abs: 4.7 K/uL (ref 1.7–7.7)
Neutrophils Relative %: 62 %
Platelet Count: 322 K/uL (ref 150–400)
RBC: 4.07 MIL/uL (ref 3.87–5.11)
RDW: 16.6 % — ABNORMAL HIGH (ref 11.5–15.5)
WBC Count: 7.6 K/uL (ref 4.0–10.5)
nRBC: 0 % (ref 0.0–0.2)

## 2024-09-08 LAB — CMP (CANCER CENTER ONLY)
ALT: 13 U/L (ref 0–44)
AST: 22 U/L (ref 15–41)
Albumin: 4 g/dL (ref 3.5–5.0)
Alkaline Phosphatase: 105 U/L (ref 38–126)
Anion gap: 9 (ref 5–15)
BUN: 8 mg/dL (ref 6–20)
CO2: 26 mmol/L (ref 22–32)
Calcium: 9.2 mg/dL (ref 8.9–10.3)
Chloride: 104 mmol/L (ref 98–111)
Creatinine: 0.71 mg/dL (ref 0.44–1.00)
GFR, Estimated: >60 mL/min (ref >60)
Glucose, Bld: 95 mg/dL (ref 70–99)
Potassium: 3.6 mmol/L (ref 3.5–5.1)
Sodium: 139 mmol/L (ref 135–145)
Total Bilirubin: 0.3 mg/dL (ref 0.0–1.2)
Total Protein: 7.8 g/dL (ref 6.5–8.1)

## 2024-09-08 MED ORDER — DEXAMETHASONE SOD PHOSPHATE PF 10 MG/ML IJ SOLN
10.0000 mg | Freq: Once | INTRAMUSCULAR | Status: AC
  Administered 2024-09-08: 10 mg via INTRAVENOUS

## 2024-09-08 MED ORDER — CAPECITABINE 500 MG PO TABS
ORAL_TABLET | ORAL | 2 refills | Status: DC
  Filled 2024-09-08: qty 70, 28d supply, fill #0

## 2024-09-08 MED ORDER — SODIUM CHLORIDE 0.9 % IV SOLN: INTRAVENOUS | Status: DC

## 2024-09-08 MED ORDER — SODIUM CHLORIDE 0.9 % IV SOLN
180.0000 mg/m2 | Freq: Once | INTRAVENOUS | Status: AC
  Administered 2024-09-08: 400 mg via INTRAVENOUS
  Filled 2024-09-08: qty 15

## 2024-09-08 MED ORDER — ATROPINE SULFATE 1 MG/ML IV SOLN
0.5000 mg | Freq: Once | INTRAVENOUS | Status: AC | PRN
  Administered 2024-09-08: 0.5 mg via INTRAVENOUS
  Filled 2024-09-08: qty 1

## 2024-09-08 MED ORDER — PALONOSETRON HCL INJECTION 0.25 MG/5ML
0.2500 mg | Freq: Once | INTRAVENOUS | Status: AC
  Administered 2024-09-08: 0.25 mg via INTRAVENOUS
  Filled 2024-09-08: qty 5

## 2024-09-08 NOTE — Progress Notes (Signed)
 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  [name] in the liver MRI also showed a 1.8 cm oligo met in segment 7 of liver. -We discussed the incurable nature of her metastatic disease, due to her metastasis in peritoneum and liver.  We also discussed the small possibility of HIPEC and liver directed therapy if she has excellent response to chemotherapy. -I recommend first-line chemotherapy FOLFOX.  She started on July 31, 2023, oxaliplatin  was held for first cycle due to open wound. -She developed seizure activity after first cycle of 5-FU infusion. She was evaluated by new oncologist Dr. Buckley, her seizure was felt to be unlikely related to 5-FU. She felt that seizure was coming after cycle 2 chemo when she coming in for pump d/c and required ativan . I changed her cehmo to CAPOX on 08/28/2023. She required dose reduction  -restaging CT 11/11/2023 showed mixed response with improved liver met and slightly worse peritoneal mets, I slightly increased her xeloda  dose  -restaging CT 02/04/2024 showed stable disease  -unfortunately scan 7/2/205 showed worsening peritoneal metastasis -chemo changed to irinotecan  and Xeloda  on  05/05/2024 -CT 08/04/2024 showed partial response   Assessment & Plan Metastatic small bowel cancer (ileum) Metastatic small bowel cancer with well-managed disease. Tumor marker levels have decreased from 87 to 32 over four months, indicating a positive response to treatment. Blood counts, kidney, and liver function tests are normal. No new symptoms reported. Current chemotherapy regimen is well-tolerated without significant side effects. - Continue current chemotherapy regimen: Capecitabine  500mg  pills 2 in the morning and 3 in the evening, 7 days on, 7 days off. - Scheduled next chemotherapy infusion for December 10th and December 30th. - Plan for repeat imaging in January. - Ensure medication refills are sent to [facility].  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 Symptoms are stable and not worsening.  Plan - Patient is overall doing well clinically, lab reviewed, adequate for treatment, will proceed to irinotecan  today and continue every 2 weeks - Will continue capecitabine  at home at the same dose - Follow-up in 2 weeks   SUMMARY OF ONCOLOGIC HISTORY: Oncology History  Cancer of ileum with carcinomatosis  07/10/2023 Initial Diagnosis   Cancer of ileum with carcinomatosis   07/22/2023 Cancer Staging   Staging form: Small Intestine - Adenocarcinoma, AJCC 8th Edition - Pathologic: Stage IV (pT4, pNX, pM1) - Signed by Lanny Callander, MD on 07/22/2023 Histologic grade (G): G2 Histologic grading system: 4 grade system   07/31/2023 - 08/16/2023 Chemotherapy   Patient is on Treatment Plan : COLORECTAL FOLFOX q14d     08/28/2023 - 03/24/2024 Chemotherapy   Patient is on Treatment Plan : COLORECTAL CAPEOX (130/850) q21d x 8 cycles     05/05/2024 -  Chemotherapy   Patient is on Treatment Plan : COLORECTAL  Irinotecan  (200) D1 + Capecitabine  D1-7 (XELIRI) q14 Days        Discussed the  use of AI scribe software for clinical note transcription with the patient, who gave verbal consent to proceed.  History of Present Illness Linda [name] is a 58 year old female with metastatic small bowel cancer who presents for follow-up.  She has no new symptoms and tolerates chemotherapy well. She experiences no stomach pain, maintains a good appetite, and her weight is stable at 199 pounds. Recent laboratory tests, including blood counts, kidney, and liver function, are normal. Her tumor marker has decreased from 87 four months ago to 32.  She continues her medication regimen of potassium and cytarabine, taking two tablets in the morning and three in the evening, on a cycle of seven days on and seven days off. Her pharmacy is processing a refill. She has enough tramadol  for pain management and uses it infrequently.  She experiences stable neuropathy in her feet and the tips of her hands. No seizures are reported.     All other systems were reviewed with the patient and are negative.  MEDICAL HISTORY:  Past Medical History:  Diagnosis Date   Abnormal uterine bleeding 04/10/2022   Anemia    Blood transfusion without reported diagnosis    Hypertension    Pre-diabetes 02/2022   Stomach cancer [facility])     SURGICAL HISTORY: Past Surgical History:  Procedure Laterality Date   BREAST LUMPECTOMY WITH RADIOACTIVE SEED LOCALIZATION Left 06/10/2022   Procedure: LEFT BREAST LUMPECTOMY WITH RADIOACTIVE SEED LOCALIZATION;  Surgeon: Curvin Deward MOULD, MD;  Location: [facility];  Service: General;  Laterality: Left;   CESAREAN SECTION     x4   COLON SURGERY     IR IMAGING GUIDED PORT INSERTION  07/08/2023   LAPAROTOMY N/A 06/30/2023   Procedure: EXPLORATORY LAPAROTOMY, SMALL BOWEL RESECTION, AND EXCISIONAL BIOPSIES OF MULTIPLE OMENTAL MASSES;  Surgeon: Signe Mitzie DELENA, MD;  Location: [facility];  Service: General;  Laterality: N/A;   TUBAL LIGATION      I have reviewed the  social history and family history with the patient and they are unchanged from previous note.  ALLERGIES:  has no known allergies.  MEDICATIONS:  Current Outpatient Medications  Medication Sig Dispense Refill   acetaminophen  (TYLENOL ) 500 MG tablet Take 2 tablets (1,000 mg total) by mouth every 8 (eight) hours as needed for mild pain or fever.     atorvastatin  (LIPITOR) 10 MG tablet Take 1 tablet (10 mg total) by mouth daily. 90 tablet 2   diclofenac  Sodium (VOLTAREN ) 1 % GEL Research Patient: Apply 0.5 grams (1 fingertip) to each hand and each foot twice daily for up to 12 weeks 400 g 0   lidocaine -prilocaine  (EMLA ) cream Apply 1 Application topically as needed. 30 g 2   nitroGLYCERIN  (NITROSTAT ) 0.4 MG SL tablet Place 1 tablet (0.4 mg total) under the tongue every 5 (five) minutes as needed for chest pain. 25 tablet 0   ondansetron  (ZOFRAN ) 8 MG tablet Take 1 tablet (8 mg total) by mouth every 8 (eight) hours as needed for nausea or vomiting. 30 tablet 2   polyethylene glycol powder (GLYCOLAX /MIRALAX ) 17 GM/SCOOP powder Take 17 g by mouth daily mixed in liquid as directed. 238 g 0   potassium chloride  SA (KLOR-CON  M) 20 MEQ tablet Take 1 tablet (20 mEq total) by mouth 3 (three) times daily. Do not crush  or break-up 90 tablet 1   prochlorperazine  (COMPAZINE ) 10 MG tablet Take 1 tablet (10 mg total) by mouth every 6 (six) hours as needed for nausea or vomiting. 30 tablet 2   simethicone  (MYLICON) 80 MG chewable tablet Chew 1 tablet (80 mg total) by mouth 4 (four) times daily as needed for flatulence. 100 tablet 0   traMADol  (ULTRAM ) 50 MG tablet Take 1 tablet (50 mg total) by mouth every 6 (six) hours as needed. 60 tablet 0   valsartan  (DIOVAN ) 40 MG tablet Take 1 tablet (40 mg total) by mouth daily. 90 tablet 2   capecitabine  (XELODA ) 500 MG tablet TAKE  2 tabs in morning and 3 tabs in evening, every 10-12 hours. Take within 30 minutes after meal. Take for 7 days on, then 7 days off, repeat every  14 days. 70 tablet 2   No current facility-administered medications for this visit.   Facility-Administered Medications Ordered in Other Visits  Medication Dose Route Frequency Provider Last Rate Last Admin   0.9 %  sodium chloride  infusion   Intravenous Continuous Lanny Callander, MD   Stopped at 09/08/24 1254    PHYSICAL EXAMINATION: ECOG PERFORMANCE STATUS: 1 - Symptomatic but completely ambulatory  Vitals:   09/08/24 0900 09/08/24 0948  BP: (!) 160/77 (!) 154/81  Pulse: 75 72  Resp: 16   Temp: 97.9 F (36.6 C)   SpO2: 98% 100%   Wt Readings from Last 3 Encounters:  09/08/24 199 lb 9.6 oz (90.5 kg)  08/25/24 199 lb 9.6 oz (90.5 kg)  07/28/24 193 lb 3.2 oz (87.6 kg)     GENERAL:alert, no distress and comfortable SKIN: skin color, texture, turgor are normal, no rashes or significant lesions EYES: normal, Conjunctiva are pink and non-injected, sclera clear NECK: supple, thyroid  normal size, non-tender, without nodularity LYMPH:  no palpable lymphadenopathy in the cervical, axillary  LUNGS: clear to auscultation and percussion with normal breathing effort HEART: regular rate & rhythm and no murmurs and no lower extremity edema ABDOMEN:abdomen soft, non-tender and normal bowel sounds Musculoskeletal:no cyanosis of digits and no clubbing  NEURO: alert & oriented x 3 with fluent speech, no focal motor/sensory deficits  Physical Exam   LABORATORY DATA:  I have reviewed the data as listed    Latest Ref Rng & Units 09/08/2024    9:28 AM 08/25/2024    8:48 AM 07/28/2024   10:25 AM  CBC  WBC 4.0 - 10.5 K/uL 7.6  8.6  7.4   Hemoglobin 12.0 - 15.0 g/dL 88.5  87.8  89.0   Hematocrit 36.0 - 46.0 % 35.4  37.9  33.8   Platelets 150 - 400 K/uL 322  325  375         Latest Ref Rng & Units 09/08/2024    9:28 AM 08/25/2024    8:48 AM 07/28/2024   10:27 AM  CMP  Glucose 70 - 99 mg/dL 95  840    BUN 6 - 20 mg/dL 8  9    Creatinine 9.55 - 1.00 mg/dL 9.28  9.10    Sodium 864 - 145  mmol/L 139  137    Potassium 3.5 - 5.1 mmol/L 3.6  3.5    Chloride 98 - 111 mmol/L 104  103    CO2 22 - 32 mmol/L 26  27    Calcium  8.9 - 10.3 mg/dL 9.2  9.3    Total Protein 6.5 - 8.1 g/dL 7.8  8.1  7.2   Total Bilirubin 0.0 -  1.2 mg/dL 0.3  0.4  0.4   Alkaline Phos 38 - 126 U/L 105  91  107   AST 15 - 41 U/L 22  14  20    ALT 0 - 44 U/L 13  9  13        RADIOGRAPHIC STUDIES: I have personally reviewed the radiological images as listed and agreed with the findings in the report. No results found.    Orders Placed This Encounter  Procedures   CBC with Differential (Cancer Center Only)    Standing Status:   Future    Expected Date:   11/17/2024    Expiration Date:   11/17/2025   CMP (Cancer Center only)    Standing Status:   Future    Expected Date:   11/17/2024    Expiration Date:   11/17/2025   CBC with Differential (Cancer Center Only)    Standing Status:   Future    Expected Date:   12/01/2024    Expiration Date:   12/01/2025   CMP (Cancer Center only)    Standing Status:   Future    Expected Date:   12/01/2024    Expiration Date:   12/01/2025   All questions were answered. The patient knows to call the clinic with any problems, questions or concerns. No barriers to learning was detected. The total time spent in the appointment was 25 minutes, including review of chart and various tests results, discussions about plan of care and coordination of care plan     Onita Mattock, MD 09/08/2024

## 2024-09-08 NOTE — Patient Instructions (Signed)
 [facility] - A DEPT OF [place]  Discharge Instructions: Thank you for choosing [facility] to provide your oncology and hematology care.   If you have a lab appointment with the Cancer Center, please go directly to the Cancer Center and check in at the registration area.   Wear comfortable clothing and clothing appropriate for easy access to any Portacath or PICC line.   We strive to give you quality time with your provider. You may need to reschedule your appointment if you arrive late (15 or more minutes).  Arriving late affects you and other patients whose appointments are after yours.  Also, if you miss three or more appointments without notifying the office, you may be dismissed from the clinic at the provider's discretion.      For prescription refill requests, have your pharmacy contact our office and allow 72 hours for refills to be completed.    Today you received the following chemotherapy and/or immunotherapy agents: Irinotecan  (Camptosar )      To help prevent nausea and vomiting after your treatment, we encourage you to take your nausea medication as directed.  BELOW ARE SYMPTOMS THAT SHOULD BE REPORTED IMMEDIATELY: *FEVER GREATER THAN 100.4 F (38 C) OR HIGHER *CHILLS OR SWEATING *NAUSEA AND VOMITING THAT IS NOT CONTROLLED WITH YOUR NAUSEA MEDICATION *UNUSUAL SHORTNESS OF BREATH *UNUSUAL BRUISING OR BLEEDING *URINARY PROBLEMS (pain or burning when urinating, or frequent urination) *BOWEL PROBLEMS (unusual diarrhea, constipation, pain near the anus) TENDERNESS IN MOUTH AND THROAT WITH OR WITHOUT PRESENCE OF ULCERS (sore throat, sores in mouth, or a toothache) UNUSUAL RASH, SWELLING OR PAIN  UNUSUAL VAGINAL DISCHARGE OR ITCHING   Items with * indicate a potential emergency and should be followed up as soon as possible or go to the Emergency Department if any problems should occur.  Please show the CHEMOTHERAPY ALERT CARD or  IMMUNOTHERAPY ALERT CARD at check-in to the Emergency Department and triage nurse.  Should you have questions after your visit or need to cancel or reschedule your appointment, please contact [facility]  Dept: [phone]  and follow the prompts.  Office hours are 8:00 a.m. to 4:30 p.m. Monday - Friday. Please note that voicemails left after 4:00 p.m. may not be returned until the following business day.  We are closed weekends and major holidays. You have access to a nurse at all times for urgent questions. Please call the main number to the clinic Dept: [phone] and follow the prompts.   For any non-urgent questions, you may also contact your provider using MyChart. We now offer e-Visits for anyone 72 and older to request care online for non-urgent symptoms. For details visit [email or web address].   Also download the MyChart app! Go to the app store, search MyChart, open the app, select [place], and log in with your MyChart username and password.

## 2024-09-15 ENCOUNTER — Ambulatory Visit: Payer: Self-pay | Admitting: Nurse Practitioner

## 2024-09-21 ENCOUNTER — Telehealth: Payer: Self-pay

## 2024-09-21 ENCOUNTER — Other Ambulatory Visit: Payer: Self-pay

## 2024-09-21 ENCOUNTER — Encounter: Payer: Self-pay | Admitting: Hematology

## 2024-09-21 ENCOUNTER — Telehealth: Payer: Self-pay | Admitting: Pharmacist

## 2024-09-21 ENCOUNTER — Other Ambulatory Visit (HOSPITAL_COMMUNITY): Payer: Self-pay

## 2024-09-21 DIAGNOSIS — C172 Malignant neoplasm of ileum: Secondary | ICD-10-CM

## 2024-09-21 MED ORDER — CAPECITABINE 500 MG PO TABS: ORAL_TABLET | ORAL | 2 refills | Status: DC

## 2024-09-21 NOTE — Progress Notes (Signed)
 Benefits Investigation Started  Reason: PA required for Primary insurance. Seconday insurance states Discrepancy Between Other Coverage Code And Other Coverage Information On File  Routed to: [facility]

## 2024-09-21 NOTE — Progress Notes (Signed)
 Prior Auth for Capecitabine  through [employer] (Primary insurance coverage) has been approved   Patient must fill through [facility] For primary Insurance to cover

## 2024-09-21 NOTE — Telephone Encounter (Signed)
 Oral Oncology Patient Advocate Encounter   Received notification that prior authorization for Capecitabine  is due for renewal.   PA submitted on 09/21/2024 Key [id number] Status is pending      Charlott Hamilton,  CPhT-Adv  she/her/hers [facility]  [place] [facility] Pharmacy Technician Patient Advocate Specialist III [facility] Phone: [phone]  Fax: [phone] [email or web address]

## 2024-09-21 NOTE — Telephone Encounter (Addendum)
 Oral Oncology Patient Advocate Encounter  Prior Authorization Renewal for Capecitabine   has been approved.    PA# [id number] Effective dates: 08/22/2024 through 08/22/2025  Patient must fill through [facility] For primary Insurance to cover Patient has been notified via Phone    Charlott Hamilton,  CPhT-Adv  she/her/hers [place]  [place] [facility] Pharmacy Technician Patient Advocate Specialist III [facility] Phone: [phone]  Fax: [phone] [email or web address]

## 2024-09-21 NOTE — Telephone Encounter (Signed)
 Oral Oncology Pharmacist Encounter   Patient's insurance requires them to fill through [facility]. Xeloda  prescription has been redirected for dispensing.    Oral Chemotherapy Clinic will follow up with outside pharmacy to ensure Rx is delivered.    Asberry Macintosh, PharmD, BCPS, BCOP Hematology/Oncology Clinical Pharmacist [phone] 09/21/2024 9:55 AM

## 2024-09-22 ENCOUNTER — Ambulatory Visit

## 2024-09-22 ENCOUNTER — Other Ambulatory Visit

## 2024-09-22 ENCOUNTER — Ambulatory Visit: Admitting: Nurse Practitioner

## 2024-09-28 NOTE — Progress Notes (Unsigned)
 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  [name] in the liver MRI also showed a 1.8 cm oligo met in segment 7 of liver. -We discussed the incurable nature of her metastatic disease, due to her metastasis in peritoneum and liver.  We also discussed the small possibility of HIPEC and liver directed therapy if she has excellent response to chemotherapy. -I recommend first-line chemotherapy FOLFOX.  She started on July 31, 2023, oxaliplatin  was held for first cycle due to open wound. -She developed seizure activity after first cycle of 5-FU infusion. She was evaluated by new oncologist Dr. Buckley, her seizure was felt to be unlikely related to 5-FU. She felt that seizure was coming after cycle 2 chemo when she coming in for pump d/c and required ativan . I changed her cehmo to CAPOX on 08/28/2023. She required dose reduction  -restaging CT 11/11/2023 showed mixed response with improved liver met and slightly worse peritoneal mets, I slightly increased her xeloda  dose  -restaging CT 02/04/2024 showed stable disease  -unfortunately scan 7/2/205 showed worsening peritoneal metastasis -chemo changed to irinotecan  and Xeloda  on 05/05/2024 -CT 08/04/2024 showed partial response     The patient understands the plans discussed today and is in agreement with them.  She knows to contact our office if  she develops concerns prior to her next appointment.  I provided *** minutes of face-to-face time during this encounter and > 50% was spent counseling as documented under my assessment and plan.    Linda FORBES Lessen, Linda [name]  [facility] - A DEPT OF [facility] [address] [place] [address] Dept: [phone] Dept Fax: [phone]   No orders of the defined types were placed in this encounter.     CHIEF COMPLAINT:  CC: Cancer of ileum with carcinomatosis  Current Treatment: Capecitabine  and irinotecan  every 2 weeks  INTERVAL HISTORY:  Linda [name] is here today for repeat clinical assessment.  She last saw Dr. Lanny on 09/08/2024.  She denies fevers or chills. She denies pain. Her appetite is good. Her weight {Weight change:10426}.  I have reviewed the past medical history, past surgical history, social history and family history with the patient and they are unchanged from previous note.  ALLERGIES:  has no known allergies.  MEDICATIONS:  Current Outpatient Medications  Medication Sig Dispense Refill   acetaminophen  (TYLENOL ) 500 MG tablet Take 2 tablets (1,000 mg total) by mouth every 8 (eight) hours as needed for mild pain or fever.     atorvastatin  (LIPITOR) 10 MG tablet Take 1 tablet (10 mg total) by mouth daily. 90 tablet 2   capecitabine  (XELODA ) 500 MG tablet Take 2 tablets (1000 mg total) by mouth in morning and 3 tablets (1500 mg total) by mouth in evening, every 10-12 hours. Take within 30 minutes after meal. Take for 7 days on, then 7 days off, repeat every 14 days. 70 tablet 2  diclofenac  Sodium (VOLTAREN ) 1 % GEL Research Patient: Apply 0.5 grams (1 fingertip) to each hand and each foot twice daily for up to 12 weeks 400 g 0   lidocaine -prilocaine  (EMLA ) cream Apply 1 Application topically as needed. 30 g 2   nitroGLYCERIN  (NITROSTAT ) 0.4 MG SL tablet Place 1 tablet (0.4 mg total) under the tongue every 5  (five) minutes as needed for chest pain. 25 tablet 0   ondansetron  (ZOFRAN ) 8 MG tablet Take 1 tablet (8 mg total) by mouth every 8 (eight) hours as needed for nausea or vomiting. 30 tablet 2   polyethylene glycol powder (GLYCOLAX /MIRALAX ) 17 GM/SCOOP powder Take 17 g by mouth daily mixed in liquid as directed. 238 g 0   potassium chloride  SA (KLOR-CON  M) 20 MEQ tablet Take 1 tablet (20 mEq total) by mouth 3 (three) times daily. Do not crush or break-up 90 tablet 1   prochlorperazine  (COMPAZINE ) 10 MG tablet Take 1 tablet (10 mg total) by mouth every 6 (six) hours as needed for nausea or vomiting. 30 tablet 2   simethicone  (MYLICON) 80 MG chewable tablet Chew 1 tablet (80 mg total) by mouth 4 (four) times daily as needed for flatulence. 100 tablet 0   traMADol  (ULTRAM ) 50 MG tablet Take 1 tablet (50 mg total) by mouth every 6 (six) hours as needed. 60 tablet 0   valsartan  (DIOVAN ) 40 MG tablet Take 1 tablet (40 mg total) by mouth daily. 90 tablet 2   No current facility-administered medications for this visit.    HISTORY OF PRESENT ILLNESS:   Oncology History  Cancer of ileum with carcinomatosis  07/10/2023 Initial Diagnosis   Cancer of ileum with carcinomatosis   07/22/2023 Cancer Staging   Staging form: Small Intestine - Adenocarcinoma, AJCC 8th Edition - Pathologic: Stage IV (pT4, pNX, pM1) - Signed by Linda Callander, Linda [name] on 07/22/2023 Histologic grade (G): G2 Histologic grading system: 4 grade system   07/31/2023 - 08/16/2023 Chemotherapy   Patient is on Treatment Plan : COLORECTAL FOLFOX q14d     08/28/2023 - 03/24/2024 Chemotherapy   Patient is on Treatment Plan : COLORECTAL CAPEOX (130/850) q21d x 8 cycles     05/05/2024 -  Chemotherapy   Patient is on Treatment Plan : COLORECTAL  Irinotecan  (200) D1 + Capecitabine  D1-7 (XELIRI) q14 Days         REVIEW OF SYSTEMS:   Constitutional: Denies fevers, chills or abnormal weight loss Eyes: Denies blurriness of vision Ears, nose, mouth,  throat, and face: Denies mucositis or sore throat Respiratory: Denies cough, dyspnea or wheezes Cardiovascular: Denies palpitation, chest discomfort or lower extremity swelling Gastrointestinal:  Denies nausea, heartburn or change in bowel habits Skin: Denies abnormal skin rashes Lymphatics: Denies new lymphadenopathy or easy bruising Neurological:Denies numbness, tingling or new weaknesses Behavioral/Psych: Mood is stable, no new changes  All other systems were reviewed with the patient and are negative.   VITALS:  There were no vitals taken for this visit.  Wt Readings from Last 3 Encounters:  09/08/24 199 lb 9.6 oz (90.5 kg)  08/25/24 199 lb 9.6 oz (90.5 kg)  07/28/24 193 lb 3.2 oz (87.6 kg)    There is no height or weight on file to calculate BMI.  Performance status (ECOG): {CHL ONC [id number]  PHYSICAL EXAM:   GENERAL:alert, no distress and comfortable SKIN: skin color, texture, turgor are normal, no rashes or significant lesions EYES: normal, Conjunctiva are pink and non-injected, sclera clear OROPHARYNX:no exudate, no erythema and lips, buccal mucosa,  and tongue normal  NECK: supple, thyroid  normal size, non-tender, without nodularity LYMPH:  no palpable lymphadenopathy in the cervical, axillary or inguinal LUNGS: clear to auscultation and percussion with normal breathing effort HEART: regular rate & rhythm and no murmurs and no lower extremity edema ABDOMEN:abdomen soft, non-tender and normal bowel sounds Musculoskeletal:no cyanosis of digits and no clubbing  NEURO: alert & oriented x 3 with fluent speech, no focal motor/sensory deficits  LABORATORY DATA:  I have reviewed the data as listed    Component Value Date/Time   NA 139 09/08/2024 0928   NA 141 05/16/2022 1044   K 3.6 09/08/2024 0928   CL 104 09/08/2024 0928   CO2 26 09/08/2024 0928   GLUCOSE 95 09/08/2024 0928   BUN 8 09/08/2024 0928   BUN 9 05/16/2022 1044   CREATININE 0.71 09/08/2024 0928    CALCIUM  9.2 09/08/2024 0928   PROT 7.8 09/08/2024 0928   PROT 7.8 05/16/2022 1044   ALBUMIN  4.0 09/08/2024 0928   ALBUMIN  3.5 (L) 08/03/2023 1515   AST 22 09/08/2024 0928   ALT 13 09/08/2024 0928   ALKPHOS 105 09/08/2024 0928   BILITOT 0.3 09/08/2024 0928   GFRNONAA >60 09/08/2024 0928    No results found for: SPEP, UPEP  Lab Results  Component Value Date   WBC 7.6 09/08/2024   NEUTROABS 4.7 09/08/2024   HGB 11.4 (L) 09/08/2024   HCT 35.4 (L) 09/08/2024   MCV 87.0 09/08/2024   PLT 322 09/08/2024      Chemistry      Component Value Date/Time   NA 139 09/08/2024 0928   NA 141 05/16/2022 1044   K 3.6 09/08/2024 0928   CL 104 09/08/2024 0928   CO2 26 09/08/2024 0928   BUN 8 09/08/2024 0928   BUN 9 05/16/2022 1044   CREATININE 0.71 09/08/2024 0928      Component Value Date/Time   CALCIUM  9.2 09/08/2024 0928   ALKPHOS 105 09/08/2024 0928   AST 22 09/08/2024 0928   ALT 13 09/08/2024 0928   BILITOT 0.3 09/08/2024 0928       RADIOGRAPHIC STUDIES: I have personally reviewed the radiological images as listed and agreed with the findings in the report. No results found.

## 2024-09-28 NOTE — Assessment & Plan Note (Signed)
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  [name] in the liver MRI also showed a 1.8 cm oligo met in segment 7 of liver. -We discussed the incurable nature of her metastatic disease, due to her metastasis in peritoneum and liver.  We also discussed the small possibility of HIPEC and liver directed therapy if she has excellent response to chemotherapy. -I recommend first-line chemotherapy FOLFOX.  She started on July 31, 2023, oxaliplatin  was held for first cycle due to open wound. -She developed seizure activity after first cycle of 5-FU infusion. She was evaluated by new oncologist Dr. Buckley, her seizure was felt to be unlikely related to 5-FU. She felt that seizure was coming after cycle 2 chemo when she coming in for pump d/c and required ativan . I changed her cehmo to CAPOX on 08/28/2023. She required dose reduction  -restaging CT 11/11/2023 showed mixed response with improved liver met and slightly worse peritoneal mets, I slightly increased her xeloda  dose  -restaging CT 02/04/2024 showed stable disease  -unfortunately scan 7/2/205 showed worsening peritoneal metastasis -chemo changed to irinotecan  and Xeloda  on 05/05/2024 -CT 08/04/2024 showed partial response

## 2024-09-29 ENCOUNTER — Inpatient Hospital Stay

## 2024-09-29 ENCOUNTER — Encounter: Payer: Self-pay | Admitting: Nurse Practitioner

## 2024-09-29 ENCOUNTER — Inpatient Hospital Stay: Admitting: Nurse Practitioner

## 2024-09-29 ENCOUNTER — Inpatient Hospital Stay: Attending: Hematology | Admitting: Nurse Practitioner

## 2024-09-29 ENCOUNTER — Encounter: Payer: Self-pay | Admitting: Hematology

## 2024-09-29 ENCOUNTER — Inpatient Hospital Stay: Attending: Hematology

## 2024-09-29 VITALS — BP 144/83 | HR 76 | Temp 98.6°F | Resp 18 | Wt 198.0 lb

## 2024-09-29 VITALS — BP 142/72 | HR 67 | Temp 98.6°F | Resp 18 | Wt 198.9 lb

## 2024-09-29 DIAGNOSIS — Z79899 Other long term (current) drug therapy: Secondary | ICD-10-CM | POA: Diagnosis not present

## 2024-09-29 DIAGNOSIS — C172 Malignant neoplasm of ileum: Secondary | ICD-10-CM

## 2024-09-29 DIAGNOSIS — Z5111 Encounter for antineoplastic chemotherapy: Secondary | ICD-10-CM | POA: Insufficient documentation

## 2024-09-29 DIAGNOSIS — C787 Secondary malignant neoplasm of liver and intrahepatic bile duct: Secondary | ICD-10-CM | POA: Diagnosis present

## 2024-09-29 LAB — CMP (CANCER CENTER ONLY)
ALT: 14 U/L (ref 0–44)
AST: 27 U/L (ref 15–41)
Albumin: 4.1 g/dL (ref 3.5–5.0)
Alkaline Phosphatase: 114 U/L (ref 38–126)
Anion gap: 9 (ref 5–15)
BUN: 6 mg/dL (ref 6–20)
CO2: 26 mmol/L (ref 22–32)
Calcium: 9.3 mg/dL (ref 8.9–10.3)
Chloride: 103 mmol/L (ref 98–111)
Creatinine: 0.69 mg/dL (ref 0.44–1.00)
GFR, Estimated: >60 mL/min (ref >60)
Glucose, Bld: 99 mg/dL (ref 70–99)
Potassium: 3.5 mmol/L (ref 3.5–5.1)
Sodium: 138 mmol/L (ref 135–145)
Total Bilirubin: 0.5 mg/dL (ref 0.0–1.2)
Total Protein: 8.3 g/dL — ABNORMAL HIGH (ref 6.5–8.1)

## 2024-09-29 LAB — CBC WITH DIFFERENTIAL (CANCER CENTER ONLY)
Abs Immature Granulocytes: 0.05 K/uL (ref 0.00–0.07)
Basophils Absolute: 0 K/uL (ref 0.0–0.1)
Basophils Relative: 0 %
Eosinophils Absolute: 0.1 K/uL (ref 0.0–0.5)
Eosinophils Relative: 1 %
HCT: 37.1 % (ref 36.0–46.0)
Hemoglobin: 12 g/dL (ref 12.0–15.0)
Immature Granulocytes: 1 %
Lymphocytes Relative: 19 %
Lymphs Abs: 1.9 K/uL (ref 0.7–4.0)
MCH: 28 pg (ref 26.0–34.0)
MCHC: 32.3 g/dL (ref 30.0–36.0)
MCV: 86.5 fL (ref 80.0–100.0)
Monocytes Absolute: 0.9 K/uL (ref 0.1–1.0)
Monocytes Relative: 9 %
Neutro Abs: 7.1 K/uL (ref 1.7–7.7)
Neutrophils Relative %: 70 %
Platelet Count: 333 K/uL (ref 150–400)
RBC: 4.29 MIL/uL (ref 3.87–5.11)
RDW: 16.1 % — ABNORMAL HIGH (ref 11.5–15.5)
WBC Count: 10.1 K/uL (ref 4.0–10.5)
nRBC: 0 % (ref 0.0–0.2)

## 2024-09-29 LAB — HEPATIC FUNCTION PANEL
ALT: 16 U/L (ref 0–44)
AST: 26 U/L (ref 15–41)
Albumin: 4.1 g/dL (ref 3.5–5.0)
Alkaline Phosphatase: 121 U/L (ref 38–126)
Bilirubin, Direct: 0.2 mg/dL (ref 0.0–0.2)
Indirect Bilirubin: 0.3 mg/dL (ref 0.3–0.9)
Total Bilirubin: 0.5 mg/dL (ref 0.0–1.2)
Total Protein: 7.8 g/dL (ref 6.5–8.1)

## 2024-09-29 LAB — CEA (ACCESS): CEA (CHCC): 31.42 ng/mL — ABNORMAL HIGH (ref 0.00–5.00)

## 2024-09-29 MED ORDER — PALONOSETRON HCL INJECTION 0.25 MG/5ML
0.2500 mg | Freq: Once | INTRAVENOUS | Status: AC
  Administered 2024-09-29: 0.25 mg via INTRAVENOUS
  Filled 2024-09-29: qty 5

## 2024-09-29 MED ORDER — SODIUM CHLORIDE 0.9 % IV SOLN
180.0000 mg/m2 | Freq: Once | INTRAVENOUS | Status: AC
  Administered 2024-09-29: 400 mg via INTRAVENOUS
  Filled 2024-09-29: qty 15

## 2024-09-29 MED ORDER — DEXAMETHASONE SOD PHOSPHATE PF 10 MG/ML IJ SOLN
10.0000 mg | Freq: Once | INTRAMUSCULAR | Status: AC
  Administered 2024-09-29: 10 mg via INTRAVENOUS

## 2024-09-29 MED ORDER — ATROPINE SULFATE 1 MG/ML IV SOLN
0.5000 mg | Freq: Once | INTRAVENOUS | Status: AC | PRN
  Administered 2024-09-29: 0.5 mg via INTRAVENOUS
  Filled 2024-09-29: qty 1

## 2024-09-29 MED ORDER — SODIUM CHLORIDE 0.9 % IV SOLN: INTRAVENOUS | Status: DC

## 2024-09-29 NOTE — Patient Instructions (Signed)
 [facility] - A DEPT OF [place]  Discharge Instructions: Thank you for choosing [facility] to provide your oncology and hematology care.   If you have a lab appointment with the Cancer Center, please go directly to the Cancer Center and check in at the registration area.   Wear comfortable clothing and clothing appropriate for easy access to any Portacath or PICC line.   We strive to give you quality time with your provider. You may need to reschedule your appointment if you arrive late (15 or more minutes).  Arriving late affects you and other patients whose appointments are after yours.  Also, if you miss three or more appointments without notifying the office, you may be dismissed from the clinic at the provider's discretion.      For prescription refill requests, have your pharmacy contact our office and allow 72 hours for refills to be completed.    Today you received the following chemotherapy and/or immunotherapy agents: Irinotecan  (Camptosar )      To help prevent nausea and vomiting after your treatment, we encourage you to take your nausea medication as directed.  BELOW ARE SYMPTOMS THAT SHOULD BE REPORTED IMMEDIATELY: *FEVER GREATER THAN 100.4 F (38 C) OR HIGHER *CHILLS OR SWEATING *NAUSEA AND VOMITING THAT IS NOT CONTROLLED WITH YOUR NAUSEA MEDICATION *UNUSUAL SHORTNESS OF BREATH *UNUSUAL BRUISING OR BLEEDING *URINARY PROBLEMS (pain or burning when urinating, or frequent urination) *BOWEL PROBLEMS (unusual diarrhea, constipation, pain near the anus) TENDERNESS IN MOUTH AND THROAT WITH OR WITHOUT PRESENCE OF ULCERS (sore throat, sores in mouth, or a toothache) UNUSUAL RASH, SWELLING OR PAIN  UNUSUAL VAGINAL DISCHARGE OR ITCHING   Items with * indicate a potential emergency and should be followed up as soon as possible or go to the Emergency Department if any problems should occur.  Please show the CHEMOTHERAPY ALERT CARD or  IMMUNOTHERAPY ALERT CARD at check-in to the Emergency Department and triage nurse.  Should you have questions after your visit or need to cancel or reschedule your appointment, please contact [facility]  Dept: [phone]  and follow the prompts.  Office hours are 8:00 a.m. to 4:30 p.m. Monday - Friday. Please note that voicemails left after 4:00 p.m. may not be returned until the following business day.  We are closed weekends and major holidays. You have access to a nurse at all times for urgent questions. Please call the main number to the clinic Dept: [phone] and follow the prompts.   For any non-urgent questions, you may also contact your provider using MyChart. We now offer e-Visits for anyone 72 and older to request care online for non-urgent symptoms. For details visit [email or web address].   Also download the MyChart app! Go to the app store, search MyChart, open the app, select [place], and log in with your MyChart username and password.

## 2024-10-01 ENCOUNTER — Other Ambulatory Visit: Payer: Self-pay

## 2024-10-17 NOTE — Progress Notes (Unsigned)
 "     [facility]   Telephone:(336) [phone] Fax:(336) [phone]    Patient Care Team: Georgina Speaks, [name] as PCP - General (General Practice) Lanny Callander, MD as Consulting Physician (Medical Oncology) Signe Mitzie LABOR, MD as Consulting Physician (General Surgery) Curvin Deward MOULD, MD as Consulting Physician (General Surgery)   CHIEF COMPLAINT: Follow up cancer of the ileum   CURRENT THERAPY: Xeloda /Irinotecan    INTERVAL HISTORY [name] returns for follow up and treatment.  Things going well on her week off, during her treatment weeks she has fatigue, heartburn, nausea, and fluctuation from constipation to diarrhea.  Medications are effective.  She has to force herself to eat and drink.  No mucositis.  She has mild tingling in her fingertips and toes without pain or altered function, no redness or cracks.  She recovers a few days into her week off.  Denies pain, fever/chills, cough, chest pain, dyspnea, or other new or specific complaints.  ROS  All other systems reviewed and negative  Past Medical History:  Diagnosis Date   Abnormal uterine bleeding 04/10/2022   Anemia    Blood transfusion without reported diagnosis    Hypertension    Pre-diabetes 02/2022   Stomach cancer [facility])      Past Surgical History:  Procedure Laterality Date   BREAST LUMPECTOMY WITH RADIOACTIVE SEED LOCALIZATION Left 06/10/2022   Procedure: LEFT BREAST LUMPECTOMY WITH RADIOACTIVE SEED LOCALIZATION;  Surgeon: Curvin Deward MOULD, MD;  Location: [facility];  Service: General;  Laterality: Left;   CESAREAN SECTION     x4   COLON SURGERY     IR IMAGING GUIDED PORT INSERTION  07/08/2023   LAPAROTOMY N/A 06/30/2023   Procedure: EXPLORATORY LAPAROTOMY, SMALL BOWEL RESECTION, AND EXCISIONAL BIOPSIES OF MULTIPLE OMENTAL MASSES;  Surgeon: Signe Mitzie LABOR, MD;  Location: [facility];  Service: General;  Laterality: N/A;   TUBAL LIGATION       Outpatient Encounter Medications as of 10/19/2024   Medication Sig   acetaminophen  (TYLENOL ) 500 MG tablet Take 2 tablets (1,000 mg total) by mouth every 8 (eight) hours as needed for mild pain or fever.   atorvastatin  (LIPITOR) 10 MG tablet Take 1 tablet (10 mg total) by mouth daily.   capecitabine  (XELODA ) 500 MG tablet Take 2 tablets (1000 mg total) by mouth in morning and 3 tablets (1500 mg total) by mouth in evening, every 10-12 hours. Take within 30 minutes after meal. Take for 7 days on, then 7 days off, repeat every 14 days.   diclofenac  Sodium (VOLTAREN ) 1 % GEL Research Patient: Apply 0.5 grams (1 fingertip) to each hand and each foot twice daily for up to 12 weeks   famotidine  (PEPCID ) 20 MG tablet Take 1 tablet (20 mg total) by mouth 2 (two) times daily.   lidocaine -prilocaine  (EMLA ) cream Apply 1 Application topically as needed.   nitroGLYCERIN  (NITROSTAT ) 0.4 MG SL tablet Place 1 tablet (0.4 mg total) under the tongue every 5 (five) minutes as needed for chest pain.   ondansetron  (ZOFRAN ) 8 MG tablet Take 1 tablet (8 mg total) by mouth every 8 (eight) hours as needed for nausea or vomiting.   polyethylene glycol powder (GLYCOLAX /MIRALAX ) 17 GM/SCOOP powder Take 17 g by mouth daily mixed in liquid as directed.   potassium chloride  SA (KLOR-CON  M) 20 MEQ tablet Take 1 tablet (20 mEq total) by mouth 3 (three) times daily. Do not crush or break-up   prochlorperazine  (COMPAZINE ) 10 MG tablet Take 1 tablet (  10 mg total) by mouth every 6 (six) hours as needed for nausea or vomiting.   simethicone  (MYLICON) 80 MG chewable tablet Chew 1 tablet (80 mg total) by mouth 4 (four) times daily as needed for flatulence.   traMADol  (ULTRAM ) 50 MG tablet Take 1 tablet (50 mg total) by mouth every 6 (six) hours as needed.   valsartan  (DIOVAN ) 40 MG tablet Take 1 tablet (40 mg total) by mouth daily.   No facility-administered encounter medications on file as of 10/19/2024.     Today's Vitals   10/19/24 0800 10/19/24 0816 10/19/24 0818  BP:  (!) 145/85  138/88  Pulse:  71 68  Resp:  16   Temp:  98.3 F (36.8 C)   TempSrc:  Temporal   SpO2:  97% 98%  Weight:  201 lb (91.2 kg)   Height:  5' 4 (1.626 m)   PainSc: 0-No pain     Body mass index is 34.5 kg/m.   ECOG PERFORMANCE STATUS: 1 - Symptomatic but completely ambulatory  PHYSICAL EXAM GENERAL:alert, no distress and comfortable SKIN: no rash  EYES: sclera clear NECK: without mass LYMPH:  no palpable cervical or supraclavicular lymphadenopathy  LUNGS: clear with normal breathing effort HEART: regular rate & rhythm, no lower extremity edema ABDOMEN: abdomen soft, non-tender and normal bowel sounds NEURO: alert & oriented x 3 with fluent speech, no focal motor/sensory deficits Breast exam:  PAC without erythema    CBC    Latest Ref Rng & Units 10/19/2024    7:52 AM 09/29/2024    8:57 AM 09/08/2024    9:28 AM  CBC  WBC 4.0 - 10.5 K/uL 8.4  10.1  7.6   Hemoglobin 12.0 - 15.0 g/dL 87.4  87.9  88.5   Hematocrit 36.0 - 46.0 % 38.2  37.1  35.4   Platelets 150 - 400 K/uL 394  333  322       CMP     Latest Ref Rng & Units 10/19/2024    7:52 AM 09/29/2024    8:59 AM 09/29/2024    8:57 AM  CMP  Glucose 70 - 99 mg/dL 894   99   BUN 6 - 20 mg/dL 8   6   Creatinine 9.55 - 1.00 mg/dL 9.27   9.30   Sodium 864 - 145 mmol/L 139   138   Potassium 3.5 - 5.1 mmol/L 3.6   3.5   Chloride 98 - 111 mmol/L 104   103   CO2 22 - 32 mmol/L 25   26   Calcium  8.9 - 10.3 mg/dL 9.2   9.3   Total Protein 6.5 - 8.1 g/dL 8.3  7.8  8.3   Total Bilirubin 0.0 - 1.2 mg/dL 0.3  0.5  0.5   Alkaline Phos 38 - 126 U/L 113  121  114   AST 15 - 41 U/L 27  26  27    ALT 0 - 44 U/L 12  16  14        ASSESSMENT & PLAN:  Cancer of ileum with carcinomatosis, 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  [name] in the liver MRI also showed a 1.8 cm oligo met in segment 7 of liver. -Began first-line chemotherapy FOLFOX.  She started on July 31, 2023, oxaliplatin  was held for first cycle due  to open wound. -She developed seizure activity after first cycle of 5-FU infusion. She was evaluated by neuro oncologist Dr. Buckley, her seizure was felt to be unlikely related to 5-FU. She felt that seizure was coming after cycle 2 chemo when she coming in for pump d/c and required ativan . I changed her cehmo to CAPOX on 08/28/2023. She required dose reduction  -Restaging CT 11/11/2023 showed mixed response with improved liver met and slightly worse peritoneal mets, xeloda  dose increased  -restaging CT 02/04/2024 showed stable disease  -unfortunately scan 7/2/205 showed worsening peritoneal metastasis -chemo changed to irinotecan  and Xeloda  on 05/05/2024 -CT 08/04/2024 showed partial response, continue same regimen irinotecan  every 2 weeks and Xeloda  1 week on/1 week off - Ms. Vandezande appears stable, tolerating treatment moderately well with mild fatigue, nausea, heartburn, and alternating constipation/diarrhea.  Side effects are well-managed with supportive care at home.  She is able to recover and function well on her week off.  There is no clinical evidence of disease progression    PLAN: -Labs reviewed -Proceed with irinotecan  and start Xeloda  today as scheduled, no dose modifications -Continue supportive care/symptom management at home. Rx: pepcid  -Follow-up with next cycle in 2 weeks -Restage in 3 weeks -Available to see her sooner if needed     All questions were answered. The patient knows to call the clinic with any problems, questions or concerns. No barriers to learning were detected.   Hill Mackie K Deanie Jupiter, NP 10/19/2024  "

## 2024-10-18 ENCOUNTER — Other Ambulatory Visit: Payer: Self-pay

## 2024-10-19 ENCOUNTER — Encounter: Payer: Self-pay | Admitting: Nurse Practitioner

## 2024-10-19 ENCOUNTER — Inpatient Hospital Stay

## 2024-10-19 ENCOUNTER — Inpatient Hospital Stay (HOSPITAL_BASED_OUTPATIENT_CLINIC_OR_DEPARTMENT_OTHER): Admitting: Nurse Practitioner

## 2024-10-19 ENCOUNTER — Other Ambulatory Visit (HOSPITAL_COMMUNITY): Payer: Self-pay

## 2024-10-19 VITALS — BP 138/88 | HR 68 | Temp 98.3°F | Resp 16 | Ht 64.0 in | Wt 201.0 lb

## 2024-10-19 DIAGNOSIS — C172 Malignant neoplasm of ileum: Secondary | ICD-10-CM | POA: Diagnosis not present

## 2024-10-19 DIAGNOSIS — Z5111 Encounter for antineoplastic chemotherapy: Secondary | ICD-10-CM | POA: Diagnosis not present

## 2024-10-19 LAB — CMP (CANCER CENTER ONLY)
ALT: 12 U/L (ref 0–44)
AST: 27 U/L (ref 15–41)
Albumin: 4.2 g/dL (ref 3.5–5.0)
Alkaline Phosphatase: 113 U/L (ref 38–126)
Anion gap: 10 (ref 5–15)
BUN: 8 mg/dL (ref 6–20)
CO2: 25 mmol/L (ref 22–32)
Calcium: 9.2 mg/dL (ref 8.9–10.3)
Chloride: 104 mmol/L (ref 98–111)
Creatinine: 0.72 mg/dL (ref 0.44–1.00)
GFR, Estimated: >60 mL/min
Glucose, Bld: 105 mg/dL — ABNORMAL HIGH (ref 70–99)
Potassium: 3.6 mmol/L (ref 3.5–5.1)
Sodium: 139 mmol/L (ref 135–145)
Total Bilirubin: 0.3 mg/dL (ref 0.0–1.2)
Total Protein: 8.3 g/dL — ABNORMAL HIGH (ref 6.5–8.1)

## 2024-10-19 LAB — CBC WITH DIFFERENTIAL (CANCER CENTER ONLY)
Abs Immature Granulocytes: 0.03 K/uL (ref 0.00–0.07)
Basophils Absolute: 0 K/uL (ref 0.0–0.1)
Basophils Relative: 0 %
Eosinophils Absolute: 0.2 K/uL (ref 0.0–0.5)
Eosinophils Relative: 3 %
HCT: 38.2 % (ref 36.0–46.0)
Hemoglobin: 12.5 g/dL (ref 12.0–15.0)
Immature Granulocytes: 0 %
Lymphocytes Relative: 26 %
Lymphs Abs: 2.1 K/uL (ref 0.7–4.0)
MCH: 27.8 pg (ref 26.0–34.0)
MCHC: 32.7 g/dL (ref 30.0–36.0)
MCV: 85.1 fL (ref 80.0–100.0)
Monocytes Absolute: 0.8 K/uL (ref 0.1–1.0)
Monocytes Relative: 9 %
Neutro Abs: 5.2 K/uL (ref 1.7–7.7)
Neutrophils Relative %: 62 %
Platelet Count: 394 K/uL (ref 150–400)
RBC: 4.49 MIL/uL (ref 3.87–5.11)
RDW: 15.5 % (ref 11.5–15.5)
WBC Count: 8.4 K/uL (ref 4.0–10.5)
nRBC: 0 % (ref 0.0–0.2)

## 2024-10-19 MED ORDER — DEXAMETHASONE SOD PHOSPHATE PF 10 MG/ML IJ SOLN
10.0000 mg | Freq: Once | INTRAMUSCULAR | Status: AC
  Administered 2024-10-19: 10 mg via INTRAVENOUS

## 2024-10-19 MED ORDER — SODIUM CHLORIDE 0.9 % IV SOLN: INTRAVENOUS | Status: DC

## 2024-10-19 MED ORDER — PALONOSETRON HCL INJECTION 0.25 MG/5ML
0.2500 mg | Freq: Once | INTRAVENOUS | Status: AC
  Administered 2024-10-19: 0.25 mg via INTRAVENOUS
  Filled 2024-10-19: qty 5

## 2024-10-19 MED ORDER — ATROPINE SULFATE 1 MG/ML IV SOLN
0.5000 mg | Freq: Once | INTRAVENOUS | Status: AC | PRN
  Administered 2024-10-19: 0.5 mg via INTRAVENOUS
  Filled 2024-10-19: qty 1

## 2024-10-19 MED ORDER — FAMOTIDINE 20 MG PO TABS
20.0000 mg | ORAL_TABLET | Freq: Two times a day (BID) | ORAL | 1 refills | Status: AC
  Filled 2024-10-19: qty 60, 30d supply, fill #0

## 2024-10-19 MED ORDER — SODIUM CHLORIDE 0.9 % IV SOLN
180.0000 mg/m2 | Freq: Once | INTRAVENOUS | Status: AC
  Administered 2024-10-19: 400 mg via INTRAVENOUS
  Filled 2024-10-19: qty 15

## 2024-10-19 NOTE — Patient Instructions (Signed)
 [facility] - A DEPT OF [place]  Discharge Instructions: Thank you for choosing [facility] to provide your oncology and hematology care.   If you have a lab appointment with the Cancer Center, please go directly to the Cancer Center and check in at the registration area.   Wear comfortable clothing and clothing appropriate for easy access to any Portacath or PICC line.   We strive to give you quality time with your provider. You may need to reschedule your appointment if you arrive late (15 or more minutes).  Arriving late affects you and other patients whose appointments are after yours.  Also, if you miss three or more appointments without notifying the office, you may be dismissed from the clinic at the provider's discretion.      For prescription refill requests, have your pharmacy contact our office and allow 72 hours for refills to be completed.    Today you received the following chemotherapy and/or immunotherapy agents: Irinotecan  (Camptosar )      To help prevent nausea and vomiting after your treatment, we encourage you to take your nausea medication as directed.  BELOW ARE SYMPTOMS THAT SHOULD BE REPORTED IMMEDIATELY: *FEVER GREATER THAN 100.4 F (38 C) OR HIGHER *CHILLS OR SWEATING *NAUSEA AND VOMITING THAT IS NOT CONTROLLED WITH YOUR NAUSEA MEDICATION *UNUSUAL SHORTNESS OF BREATH *UNUSUAL BRUISING OR BLEEDING *URINARY PROBLEMS (pain or burning when urinating, or frequent urination) *BOWEL PROBLEMS (unusual diarrhea, constipation, pain near the anus) TENDERNESS IN MOUTH AND THROAT WITH OR WITHOUT PRESENCE OF ULCERS (sore throat, sores in mouth, or a toothache) UNUSUAL RASH, SWELLING OR PAIN  UNUSUAL VAGINAL DISCHARGE OR ITCHING   Items with * indicate a potential emergency and should be followed up as soon as possible or go to the Emergency Department if any problems should occur.  Please show the CHEMOTHERAPY ALERT CARD or  IMMUNOTHERAPY ALERT CARD at check-in to the Emergency Department and triage nurse.  Should you have questions after your visit or need to cancel or reschedule your appointment, please contact [facility]  Dept: [phone]  and follow the prompts.  Office hours are 8:00 a.m. to 4:30 p.m. Monday - Friday. Please note that voicemails left after 4:00 p.m. may not be returned until the following business day.  We are closed weekends and major holidays. You have access to a nurse at all times for urgent questions. Please call the main number to the clinic Dept: [phone] and follow the prompts.   For any non-urgent questions, you may also contact your provider using MyChart. We now offer e-Visits for anyone 72 and older to request care online for non-urgent symptoms. For details visit [email or web address].   Also download the MyChart app! Go to the app store, search MyChart, open the app, select [place], and log in with your MyChart username and password.

## 2024-10-29 ENCOUNTER — Other Ambulatory Visit (HOSPITAL_COMMUNITY): Payer: Self-pay

## 2024-11-04 ENCOUNTER — Inpatient Hospital Stay: Admitting: Hematology

## 2024-11-04 ENCOUNTER — Inpatient Hospital Stay

## 2024-11-04 ENCOUNTER — Inpatient Hospital Stay: Attending: Hematology

## 2024-11-04 VITALS — BP 144/90 | HR 73 | Temp 99.6°F | Resp 17 | Ht 64.0 in | Wt 205.7 lb

## 2024-11-04 VITALS — BP 155/75 | HR 69 | Resp 16

## 2024-11-04 DIAGNOSIS — C787 Secondary malignant neoplasm of liver and intrahepatic bile duct: Secondary | ICD-10-CM | POA: Diagnosis present

## 2024-11-04 DIAGNOSIS — Z5111 Encounter for antineoplastic chemotherapy: Secondary | ICD-10-CM | POA: Diagnosis present

## 2024-11-04 DIAGNOSIS — C172 Malignant neoplasm of ileum: Secondary | ICD-10-CM

## 2024-11-04 DIAGNOSIS — C786 Secondary malignant neoplasm of retroperitoneum and peritoneum: Secondary | ICD-10-CM | POA: Diagnosis not present

## 2024-11-04 DIAGNOSIS — Z95828 Presence of other vascular implants and grafts: Secondary | ICD-10-CM

## 2024-11-04 LAB — CBC WITH DIFFERENTIAL (CANCER CENTER ONLY)
Abs Immature Granulocytes: 0.04 K/uL (ref 0.00–0.07)
Basophils Absolute: 0.1 K/uL (ref 0.0–0.1)
Basophils Relative: 1 %
Eosinophils Absolute: 0.2 K/uL (ref 0.0–0.5)
Eosinophils Relative: 2 %
HCT: 37.7 % (ref 36.0–46.0)
Hemoglobin: 12.3 g/dL (ref 12.0–15.0)
Immature Granulocytes: 0 %
Lymphocytes Relative: 21 %
Lymphs Abs: 2.1 K/uL (ref 0.7–4.0)
MCH: 27.7 pg (ref 26.0–34.0)
MCHC: 32.6 g/dL (ref 30.0–36.0)
MCV: 84.9 fL (ref 80.0–100.0)
Monocytes Absolute: 0.9 K/uL (ref 0.1–1.0)
Monocytes Relative: 9 %
Neutro Abs: 6.8 K/uL (ref 1.7–7.7)
Neutrophils Relative %: 67 %
Platelet Count: 356 K/uL (ref 150–400)
RBC: 4.44 MIL/uL (ref 3.87–5.11)
RDW: 15.2 % (ref 11.5–15.5)
WBC Count: 10.1 K/uL (ref 4.0–10.5)
nRBC: 0 % (ref 0.0–0.2)

## 2024-11-04 LAB — HEPATIC FUNCTION PANEL
ALT: 14 U/L (ref 0–44)
AST: 26 U/L (ref 15–41)
Albumin: 4.1 g/dL (ref 3.5–5.0)
Alkaline Phosphatase: 121 U/L (ref 38–126)
Bilirubin, Direct: 0.1 mg/dL (ref 0.0–0.2)
Indirect Bilirubin: 0.2 mg/dL — ABNORMAL LOW (ref 0.3–0.9)
Total Bilirubin: 0.3 mg/dL (ref 0.0–1.2)
Total Protein: 7.5 g/dL (ref 6.5–8.1)

## 2024-11-04 LAB — CMP (CANCER CENTER ONLY)
ALT: 13 U/L (ref 0–44)
AST: 26 U/L (ref 15–41)
Albumin: 4.1 g/dL (ref 3.5–5.0)
Alkaline Phosphatase: 113 U/L (ref 38–126)
Anion gap: 11 (ref 5–15)
BUN: 7 mg/dL (ref 6–20)
CO2: 26 mmol/L (ref 22–32)
Calcium: 9.3 mg/dL (ref 8.9–10.3)
Chloride: 101 mmol/L (ref 98–111)
Creatinine: 0.87 mg/dL (ref 0.44–1.00)
GFR, Estimated: >60 mL/min
Glucose, Bld: 99 mg/dL (ref 70–99)
Potassium: 3.3 mmol/L — ABNORMAL LOW (ref 3.5–5.1)
Sodium: 139 mmol/L (ref 135–145)
Total Bilirubin: 0.4 mg/dL (ref 0.0–1.2)
Total Protein: 8.2 g/dL — ABNORMAL HIGH (ref 6.5–8.1)

## 2024-11-04 LAB — CEA (ACCESS): CEA (CHCC): 42.64 ng/mL — ABNORMAL HIGH (ref 0.00–5.00)

## 2024-11-04 MED ORDER — ATROPINE SULFATE 1 MG/ML IV SOLN
0.5000 mg | Freq: Once | INTRAVENOUS | Status: AC | PRN
  Administered 2024-11-04: 0.5 mg via INTRAVENOUS
  Filled 2024-11-04: qty 1

## 2024-11-04 MED ORDER — PALONOSETRON HCL INJECTION 0.25 MG/5ML
0.2500 mg | Freq: Once | INTRAVENOUS | Status: AC
  Administered 2024-11-04: 0.25 mg via INTRAVENOUS
  Filled 2024-11-04: qty 5

## 2024-11-04 MED ORDER — DEXAMETHASONE SOD PHOSPHATE PF 10 MG/ML IJ SOLN
10.0000 mg | Freq: Once | INTRAMUSCULAR | Status: AC
  Administered 2024-11-04: 10 mg via INTRAVENOUS
  Filled 2024-11-04: qty 1

## 2024-11-04 MED ORDER — SODIUM CHLORIDE 0.9% FLUSH
10.0000 mL | Freq: Once | INTRAVENOUS | Status: AC
  Administered 2024-11-04: 10 mL

## 2024-11-04 MED ORDER — SODIUM CHLORIDE 0.9 % IV SOLN: INTRAVENOUS | Status: DC

## 2024-11-04 MED ORDER — SODIUM CHLORIDE 0.9 % IV SOLN
180.0000 mg/m2 | Freq: Once | INTRAVENOUS | Status: AC
  Administered 2024-11-04: 400 mg via INTRAVENOUS
  Filled 2024-11-04: qty 15

## 2024-11-04 NOTE — Progress Notes (Signed)
 " [facility]   Telephone:(336) [phone] Fax:(336) [phone]   Clinic Follow up Note   Patient Care Team: Georgina Speaks, [name] as PCP - General (General Practice) Lanny Callander, MD as Consulting Physician (Medical Oncology) Signe Mitzie LABOR, MD as Consulting Physician (General Surgery) Curvin Deward MOULD, MD as Consulting Physician (General Surgery)  Date of Service:  11/04/2024  CHIEF COMPLAINT: f/u of statical small bowel cancer  CURRENT THERAPY:  Capecitabine  and irinotecan   Oncology History   Cancer of ileum with carcinomatosis -[name] with diffuse peritoneal and solitary liver metastasis, diagnosed in early September 2024, MMR normal, KRAS G12D (+) -Patient presented with small bowel obstruction, status post surgical resection of the primary tumor and peritoneal nodule biopsy.  [name] in the liver MRI also showed a 1.8 cm oligo met in segment 7 of liver. -We discussed the incurable nature of her metastatic disease, due to her metastasis in peritoneum and liver.  We also discussed the small possibility of HIPEC and liver directed therapy if she has excellent response to chemotherapy. -I recommend first-line chemotherapy FOLFOX.  She started on July 31, 2023, oxaliplatin  was held for first cycle due to open wound. -She developed seizure activity after first cycle of 5-FU infusion. She was evaluated by new oncologist Dr. Buckley, her seizure was felt to be unlikely related to 5-FU. She felt that seizure was coming after cycle 2 chemo when she coming in for pump d/c and required ativan . I changed her cehmo to CAPOX on 08/28/2023. 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 She is tolerating the regimen well, without new adverse effects or symptoms. Tumor marker has decreased from prior elevation and remains stable, indicating disease control. Blood pressure is mildly elevated but does not impact current oncologic management. - Reviewed tolerance of current chemotherapy regimen (irinotecan  and oral agent); no new symptoms or side effects reported. - Ordered laboratory studies and tumor marker; results pending. - Confirmed upcoming imaging scheduled for next week to assess disease status. - Planned to review imaging and laboratory results at next follow-up in two weeks. - Confirmed oral chemotherapy refills available; instructed her to contact pharmacy as needed. - Reviewed supportive medications (potassium, antiemetics); no refills required at this time.  Plan - Clinically doing well and tolerating treatment well. - Lab reviewed, adequate for treatment, will proceed irinotecan  today and continue every 2 weeks - Continue Xeloda  at home - In 2 weeks - She is scheduled for restaging CT scan next week.   SUMMARY OF ONCOLOGIC HISTORY: Oncology History  Cancer of ileum with carcinomatosis  07/10/2023 Initial Diagnosis   Cancer of ileum with carcinomatosis   07/22/2023 Cancer Staging   Staging form: Small Intestine - Adenocarcinoma, AJCC 8th Edition - Pathologic: Stage IV (pT4, pNX, pM1) - Signed by Lanny Callander, MD on 07/22/2023 Histologic grade (G): G2 Histologic grading system: 4 grade system   07/31/2023 - 08/16/2023 Chemotherapy   Patient is on Treatment Plan : COLORECTAL FOLFOX q14d     08/28/2023 - 03/24/2024 Chemotherapy   Patient is on Treatment  Plan : COLORECTAL CAPEOX (130/850) q21d x 8 cycles     05/05/2024 -  Chemotherapy   Patient is on Treatment  Plan : COLORECTAL  Irinotecan  (200) D1 + Capecitabine  D1-7 (XELIRI) q14 Days        Discussed the use of AI scribe software for clinical note transcription with the patient, who gave verbal consent to proceed.  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  She reports no new treatment-related adverse effects. She denies abdominal pain, nausea, seizures, or dizziness. Appetite is preserved and she has not needed antiemetics.  Her tumor marker about one month ago was 31, improved from 45. She had restaging labs drawn today, results pending, and is scheduled for imaging next week.  She continues lisinopril and potassium as prescribed and needs a refill of her oral chemotherapy.     All other systems were reviewed with the patient and are negative.  MEDICAL HISTORY:  Past Medical History:  Diagnosis Date   Abnormal uterine bleeding 04/10/2022   Anemia    Blood transfusion without reported diagnosis    Hypertension    Pre-diabetes 02/2022   Stomach cancer [facility])     SURGICAL HISTORY: Past Surgical History:  Procedure Laterality Date   BREAST LUMPECTOMY WITH RADIOACTIVE SEED LOCALIZATION Left 06/10/2022   Procedure: LEFT BREAST LUMPECTOMY WITH RADIOACTIVE SEED LOCALIZATION;  Surgeon: Curvin Deward MOULD, MD;  Location: [facility];  Service: General;  Laterality: Left;   CESAREAN SECTION     x4   COLON SURGERY     IR IMAGING GUIDED PORT INSERTION  07/08/2023   LAPAROTOMY N/A 06/30/2023   Procedure: EXPLORATORY LAPAROTOMY, SMALL BOWEL RESECTION, AND EXCISIONAL BIOPSIES OF MULTIPLE OMENTAL MASSES;  Surgeon: Signe Mitzie DELENA, MD;  Location: [facility];  Service: General;  Laterality: N/A;   TUBAL LIGATION      I have reviewed the social history and family history with the patient and they are unchanged from previous  note.  ALLERGIES:  has no known allergies.  MEDICATIONS:  Current Outpatient Medications  Medication Sig Dispense Refill   acetaminophen  (TYLENOL ) 500 MG tablet Take 2 tablets (1,000 mg total) by mouth every 8 (eight) hours as needed for mild pain or fever.     atorvastatin  (LIPITOR) 10 MG tablet Take 1 tablet (10 mg total) by mouth daily. 90 tablet 2   capecitabine  (XELODA ) 500 MG tablet Take 2 tablets (1000 mg total) by mouth in morning and 3 tablets (1500 mg total) by mouth in evening, every 10-12 hours. Take within 30 minutes after meal. Take for 7 days on, then 7 days off, repeat every 14 days. 70 tablet 2   diclofenac  Sodium (VOLTAREN ) 1 % GEL Research Patient: Apply 0.5 grams (1 fingertip) to each hand and each foot twice daily for up to 12 weeks 400 g 0   famotidine  (PEPCID ) 20 MG tablet Take 1 tablet (20 mg total) by mouth 2 (two) times daily. 60 tablet 1   lidocaine -prilocaine  (EMLA ) cream Apply 1 Application topically as needed. 30 g 2   nitroGLYCERIN  (NITROSTAT ) 0.4 MG SL tablet Place 1 tablet (0.4 mg total) under the tongue every 5 (five) minutes as needed for chest pain. 25 tablet 0   ondansetron  (ZOFRAN ) 8 MG tablet Take 1 tablet (8 mg total) by mouth every 8 (eight) hours as needed for nausea or vomiting. 30 tablet 2  polyethylene glycol powder (GLYCOLAX /MIRALAX ) 17 GM/SCOOP powder Take 17 g by mouth daily mixed in liquid as directed. 238 g 0   potassium chloride  SA (KLOR-CON  M) 20 MEQ tablet Take 1 tablet (20 mEq total) by mouth 3 (three) times daily. Do not crush or break-up 90 tablet 1   prochlorperazine  (COMPAZINE ) 10 MG tablet Take 1 tablet (10 mg total) by mouth every 6 (six) hours as needed for nausea or vomiting. 30 tablet 2   simethicone  (MYLICON) 80 MG chewable tablet Chew 1 tablet (80 mg total) by mouth 4 (four) times daily as needed for flatulence. 100 tablet 0   traMADol  (ULTRAM ) 50 MG tablet Take 1 tablet (50 mg total) by mouth every 6 (six) hours as needed. 60  tablet 0   valsartan  (DIOVAN ) 40 MG tablet Take 1 tablet (40 mg total) by mouth daily. 90 tablet 2   No current facility-administered medications for this visit.    PHYSICAL EXAMINATION: ECOG PERFORMANCE STATUS: 1 - Symptomatic but completely ambulatory  Vitals:   11/04/24 1300 11/04/24 1328  BP: (!) 146/92 (!) 144/90  Pulse: 73   Resp: 17   Temp: 99.6 F (37.6 C)   SpO2: 98%    Wt Readings from Last 3 Encounters:  11/04/24 205 lb 11.2 oz (93.3 kg)  10/19/24 201 lb (91.2 kg)  09/29/24 198 lb (89.8 kg)     GENERAL:alert, no distress and comfortable SKIN: skin color, texture, turgor are normal, no rashes or significant lesions EYES: normal, Conjunctiva are pink and non-injected, sclera clear NECK: supple, thyroid  normal size, non-tender, without nodularity LYMPH:  no palpable lymphadenopathy in the cervical, axillary  LUNGS: clear to auscultation and percussion with normal breathing effort HEART: regular rate & rhythm and no murmurs and no lower extremity edema ABDOMEN:abdomen soft, non-tender and normal bowel sounds Musculoskeletal:no cyanosis of digits and no clubbing  NEURO: alert & oriented x 3 with fluent speech, no focal motor/sensory deficits  Physical Exam    LABORATORY DATA:  I have reviewed the data as listed    Latest Ref Rng & Units 11/04/2024    1:18 PM 10/19/2024    7:52 AM 09/29/2024    8:57 AM  CBC  WBC 4.0 - 10.5 K/uL 10.1  8.4  10.1   Hemoglobin 12.0 - 15.0 g/dL 87.6  87.4  87.9   Hematocrit 36.0 - 46.0 % 37.7  38.2  37.1   Platelets 150 - 400 K/uL 356  394  333         Latest Ref Rng & Units 11/04/2024    1:18 PM 10/19/2024    7:52 AM 09/29/2024    8:59 AM  CMP  Glucose 70 - 99 mg/dL 99  894    BUN 6 - 20 mg/dL 7  8    Creatinine 9.55 - 1.00 mg/dL 9.12  9.27    Sodium 864 - 145 mmol/L 139  139    Potassium 3.5 - 5.1 mmol/L 3.3  3.6    Chloride 98 - 111 mmol/L 101  104    CO2 22 - 32 mmol/L 26  25    Calcium  8.9 - 10.3 mg/dL 9.3  9.2     Total Protein 6.5 - 8.1 g/dL 6.5 - 8.1 g/dL 8.2    7.5  8.3  7.8   Total Bilirubin 0.0 - 1.2 mg/dL 0.0 - 1.2 mg/dL 0.4    0.3  0.3  0.5   Alkaline Phos 38 - 126 U/L 38 - 126 U/L 113  121  113  121   AST 15 - 41 U/L 15 - 41 U/L 26    26  27  26    ALT 0 - 44 U/L 0 - 44 U/L 13    14  12  16        RADIOGRAPHIC STUDIES: I have personally reviewed the radiological images as listed and agreed with the findings in the report. No results found.    No orders of the defined types were placed in this encounter.  All questions were answered. The patient knows to call the clinic with any problems, questions or concerns. No barriers to learning was detected. The total time spent in the appointment was 25 minutes, including review of chart and various tests results, discussions about plan of care and coordination of care plan     Onita Mattock, MD 11/04/2024     "

## 2024-11-04 NOTE — Patient Instructions (Signed)
 [facility] - A DEPT OF [place]  Discharge Instructions: Thank you for choosing [facility] to provide your oncology and hematology care.   If you have a lab appointment with the Cancer Center, please go directly to the Cancer Center and check in at the registration area.   Wear comfortable clothing and clothing appropriate for easy access to any Portacath or PICC line.   We strive to give you quality time with your provider. You may need to reschedule your appointment if you arrive late (15 or more minutes).  Arriving late affects you and other patients whose appointments are after yours.  Also, if you miss three or more appointments without notifying the office, you may be dismissed from the clinic at the provider's discretion.      For prescription refill requests, have your pharmacy contact our office and allow 72 hours for refills to be completed.    Today you received the following chemotherapy and/or immunotherapy agents: Irinotecan  (Camptosar )      To help prevent nausea and vomiting after your treatment, we encourage you to take your nausea medication as directed.  BELOW ARE SYMPTOMS THAT SHOULD BE REPORTED IMMEDIATELY: *FEVER GREATER THAN 100.4 F (38 C) OR HIGHER *CHILLS OR SWEATING *NAUSEA AND VOMITING THAT IS NOT CONTROLLED WITH YOUR NAUSEA MEDICATION *UNUSUAL SHORTNESS OF BREATH *UNUSUAL BRUISING OR BLEEDING *URINARY PROBLEMS (pain or burning when urinating, or frequent urination) *BOWEL PROBLEMS (unusual diarrhea, constipation, pain near the anus) TENDERNESS IN MOUTH AND THROAT WITH OR WITHOUT PRESENCE OF ULCERS (sore throat, sores in mouth, or a toothache) UNUSUAL RASH, SWELLING OR PAIN  UNUSUAL VAGINAL DISCHARGE OR ITCHING   Items with * indicate a potential emergency and should be followed up as soon as possible or go to the Emergency Department if any problems should occur.  Please show the CHEMOTHERAPY ALERT CARD or  IMMUNOTHERAPY ALERT CARD at check-in to the Emergency Department and triage nurse.  Should you have questions after your visit or need to cancel or reschedule your appointment, please contact [facility]  Dept: [phone]  and follow the prompts.  Office hours are 8:00 a.m. to 4:30 p.m. Monday - Friday. Please note that voicemails left after 4:00 p.m. may not be returned until the following business day.  We are closed weekends and major holidays. You have access to a nurse at all times for urgent questions. Please call the main number to the clinic Dept: [phone] and follow the prompts.   For any non-urgent questions, you may also contact your provider using MyChart. We now offer e-Visits for anyone 72 and older to request care online for non-urgent symptoms. For details visit [email or web address].   Also download the MyChart app! Go to the app store, search MyChart, open the app, select [place], and log in with your MyChart username and password.

## 2024-11-04 NOTE — Assessment & Plan Note (Signed)
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  [name] in the liver MRI also showed a 1.8 cm oligo met in segment 7 of liver. -We discussed the incurable nature of her metastatic disease, due to her metastasis in peritoneum and liver.  We also discussed the small possibility of HIPEC and liver directed therapy if she has excellent response to chemotherapy. -I recommend first-line chemotherapy FOLFOX.  She started on July 31, 2023, oxaliplatin  was held for first cycle due to open wound. -She developed seizure activity after first cycle of 5-FU infusion. She was evaluated by new oncologist Dr. Buckley, her seizure was felt to be unlikely related to 5-FU. She felt that seizure was coming after cycle 2 chemo when she coming in for pump d/c and required ativan . I changed her cehmo to CAPOX on 08/28/2023. She required dose reduction  -restaging CT 11/11/2023 showed mixed response with improved liver met and slightly worse peritoneal mets, I slightly increased her xeloda  dose  -restaging CT 02/04/2024 showed stable disease  -unfortunately scan 7/2/205 showed worsening peritoneal metastasis -chemo changed to irinotecan  and Xeloda  on 05/05/2024 -CT 08/04/2024 showed partial response

## 2024-11-09 ENCOUNTER — Ambulatory Visit (HOSPITAL_COMMUNITY)
Admission: RE | Admit: 2024-11-09 | Discharge: 2024-11-09 | Disposition: A | Discharge status: Home / Self Care | Source: Ambulatory Visit | Attending: Nurse Practitioner | Admitting: Nurse Practitioner

## 2024-11-09 DIAGNOSIS — C172 Malignant neoplasm of ileum: Secondary | ICD-10-CM | POA: Insufficient documentation

## 2024-11-09 MED ORDER — IOHEXOL 300 MG/ML  SOLN
100.0000 mL | Freq: Once | INTRAMUSCULAR | Status: AC | PRN
  Administered 2024-11-09: 100 mL via INTRAVENOUS

## 2024-11-09 MED ORDER — IOHEXOL 9 MG/ML PO SOLN: 1000.0000 mL | ORAL | Status: AC

## 2024-11-09 MED ORDER — HEPARIN SOD (PORK) LOCK FLUSH 100 UNIT/ML IV SOLN
500.0000 [IU] | Freq: Once | INTRAVENOUS | Status: AC
  Administered 2024-11-09: 500 [IU] via INTRAVENOUS

## 2024-11-09 MED ORDER — HEPARIN SOD (PORK) LOCK FLUSH 100 UNIT/ML IV SOLN
INTRAVENOUS | Status: AC
  Filled 2024-11-09: qty 5

## 2024-11-16 ENCOUNTER — Other Ambulatory Visit: Payer: Self-pay | Admitting: Hematology

## 2024-11-16 DIAGNOSIS — C172 Malignant neoplasm of ileum: Secondary | ICD-10-CM

## 2024-11-17 ENCOUNTER — Inpatient Hospital Stay

## 2024-11-17 ENCOUNTER — Inpatient Hospital Stay: Admitting: Hematology

## 2024-11-18 ENCOUNTER — Other Ambulatory Visit: Payer: Self-pay

## 2024-11-23 ENCOUNTER — Other Ambulatory Visit: Payer: Self-pay | Admitting: Nurse Practitioner

## 2024-11-23 DIAGNOSIS — C172 Malignant neoplasm of ileum: Secondary | ICD-10-CM

## 2024-11-25 ENCOUNTER — Inpatient Hospital Stay: Attending: Hematology

## 2024-11-25 ENCOUNTER — Other Ambulatory Visit: Payer: Self-pay

## 2024-11-25 ENCOUNTER — Inpatient Hospital Stay: Attending: Hematology | Admitting: Nurse Practitioner

## 2024-11-25 ENCOUNTER — Other Ambulatory Visit (HOSPITAL_COMMUNITY): Payer: Self-pay

## 2024-11-25 ENCOUNTER — Inpatient Hospital Stay

## 2024-11-25 ENCOUNTER — Encounter: Payer: Self-pay | Admitting: Hematology

## 2024-11-25 VITALS — BP 150/80 | HR 90 | Temp 98.4°F | Resp 17 | Wt 205.4 lb

## 2024-11-25 DIAGNOSIS — C172 Malignant neoplasm of ileum: Secondary | ICD-10-CM

## 2024-11-25 LAB — CMP (CANCER CENTER ONLY)
ALT: 11 U/L (ref 0–44)
AST: 22 U/L (ref 15–41)
Albumin: 4.1 g/dL (ref 3.5–5.0)
Alkaline Phosphatase: 105 U/L (ref 38–126)
Anion gap: 11 (ref 5–15)
BUN: 7 mg/dL (ref 6–20)
CO2: 25 mmol/L (ref 22–32)
Calcium: 9.3 mg/dL (ref 8.9–10.3)
Chloride: 103 mmol/L (ref 98–111)
Creatinine: 0.91 mg/dL (ref 0.44–1.00)
GFR, Estimated: >60 mL/min
Glucose, Bld: 158 mg/dL — ABNORMAL HIGH (ref 70–99)
Potassium: 3.4 mmol/L — ABNORMAL LOW (ref 3.5–5.1)
Sodium: 139 mmol/L (ref 135–145)
Total Bilirubin: 0.3 mg/dL (ref 0.0–1.2)
Total Protein: 7.8 g/dL (ref 6.5–8.1)

## 2024-11-25 LAB — CBC WITH DIFFERENTIAL (CANCER CENTER ONLY)
Abs Immature Granulocytes: 0.04 10*3/uL (ref 0.00–0.07)
Basophils Absolute: 0 10*3/uL (ref 0.0–0.1)
Basophils Relative: 1 %
Eosinophils Absolute: 0.2 10*3/uL (ref 0.0–0.5)
Eosinophils Relative: 3 %
HCT: 34.4 % — ABNORMAL LOW (ref 36.0–46.0)
Hemoglobin: 11.3 g/dL — ABNORMAL LOW (ref 12.0–15.0)
Immature Granulocytes: 1 %
Lymphocytes Relative: 25 %
Lymphs Abs: 1.7 10*3/uL (ref 0.7–4.0)
MCH: 28.1 pg (ref 26.0–34.0)
MCHC: 32.8 g/dL (ref 30.0–36.0)
MCV: 85.6 fL (ref 80.0–100.0)
Monocytes Absolute: 0.6 10*3/uL (ref 0.1–1.0)
Monocytes Relative: 9 %
Neutro Abs: 4.1 10*3/uL (ref 1.7–7.7)
Neutrophils Relative %: 61 %
Platelet Count: 381 10*3/uL (ref 150–400)
RBC: 4.02 MIL/uL (ref 3.87–5.11)
RDW: 15.9 % — ABNORMAL HIGH (ref 11.5–15.5)
WBC Count: 6.6 10*3/uL (ref 4.0–10.5)
nRBC: 0 % (ref 0.0–0.2)

## 2024-11-25 LAB — CEA (ACCESS): CEA (CHCC): 50.04 ng/mL — ABNORMAL HIGH (ref 0.00–5.00)

## 2024-11-25 MED ORDER — CAPECITABINE 500 MG PO TABS: ORAL_TABLET | ORAL | 2 refills | Status: DC

## 2024-11-25 MED ORDER — SODIUM CHLORIDE 0.9 % IV SOLN: INTRAVENOUS | Status: DC

## 2024-11-25 MED ORDER — CAPECITABINE 500 MG PO TABS: ORAL_TABLET | ORAL | 2 refills | Status: AC

## 2024-11-25 MED ORDER — SODIUM CHLORIDE 0.9 % IV SOLN
180.0000 mg/m2 | Freq: Once | INTRAVENOUS | Status: AC
  Administered 2024-11-25: 400 mg via INTRAVENOUS
  Filled 2024-11-25: qty 15

## 2024-11-25 MED ORDER — DEXAMETHASONE SOD PHOSPHATE PF 10 MG/ML IJ SOLN
10.0000 mg | Freq: Once | INTRAMUSCULAR | Status: AC
  Administered 2024-11-25: 10 mg via INTRAVENOUS
  Filled 2024-11-25: qty 1

## 2024-11-25 MED ORDER — PALONOSETRON HCL INJECTION 0.25 MG/5ML
0.2500 mg | Freq: Once | INTRAVENOUS | Status: AC
  Administered 2024-11-25: 0.25 mg via INTRAVENOUS
  Filled 2024-11-25: qty 5

## 2024-11-25 MED ORDER — ATROPINE SULFATE 1 MG/ML IV SOLN
0.5000 mg | Freq: Once | INTRAVENOUS | Status: AC | PRN
  Administered 2024-11-25: 0.5 mg via INTRAVENOUS
  Filled 2024-11-25: qty 1

## 2024-11-25 NOTE — Patient Instructions (Signed)
 [facility] - A DEPT OF [facility]  Discharge Instructions: Thank you for choosing [facility] to provide your oncology and hematology care.   If you have a lab appointment with the Cancer Center, please go directly to the Cancer Center and check in at the registration area.   Wear comfortable clothing and clothing appropriate for easy access to any Portacath or PICC line.   We strive to give you quality time with your provider. You may need to reschedule your appointment if you arrive late (15 or more minutes).  Arriving late affects you and other patients whose appointments are after yours.  Also, if you miss three or more appointments without notifying the office, you may be dismissed from the clinic at the provider's discretion.      For prescription refill requests, have your pharmacy contact our office and allow 72 hours for refills to be completed.    Today you received the following chemotherapy and/or immunotherapy agents: Irinotecan      To help prevent nausea and vomiting after your treatment, we encourage you to take your nausea medication as directed.  BELOW ARE SYMPTOMS THAT SHOULD BE REPORTED IMMEDIATELY: *FEVER GREATER THAN 100.4 F (38 C) OR HIGHER *CHILLS OR SWEATING *NAUSEA AND VOMITING THAT IS NOT CONTROLLED WITH YOUR NAUSEA MEDICATION *UNUSUAL SHORTNESS OF BREATH *UNUSUAL BRUISING OR BLEEDING *URINARY PROBLEMS (pain or burning when urinating, or frequent urination) *BOWEL PROBLEMS (unusual diarrhea, constipation, pain near the anus) TENDERNESS IN MOUTH AND THROAT WITH OR WITHOUT PRESENCE OF ULCERS (sore throat, sores in mouth, or a toothache) UNUSUAL RASH, SWELLING OR PAIN  UNUSUAL VAGINAL DISCHARGE OR ITCHING   Items with * indicate a potential emergency and should be followed up as soon as possible or go to the Emergency Department if any problems should occur.  Please show the CHEMOTHERAPY ALERT CARD or IMMUNOTHERAPY  ALERT CARD at check-in to the Emergency Department and triage nurse.  Should you have questions after your visit or need to cancel or reschedule your appointment, please contact [facility] - A DEPT OF [facility]  Dept: [phone]  and follow the prompts.  Office hours are 8:00 a.m. to 4:30 p.m. Monday - Friday. Please note that voicemails left after 4:00 p.m. may not be returned until the following business day.  We are closed weekends and major holidays. You have access to a nurse at all times for urgent questions. Please call the main number to the clinic Dept: [phone] and follow the prompts.   For any non-urgent questions, you may also contact your provider using MyChart. We now offer e-Visits for anyone 63 and older to request care online for non-urgent symptoms. For details visit [email or web address].   Also download the MyChart app! Go to the app store, search "MyChart", open the app, select [facility], and log in with your MyChart username and password.

## 2024-11-26 ENCOUNTER — Encounter: Payer: Self-pay | Admitting: Hematology

## 2024-12-01 ENCOUNTER — Inpatient Hospital Stay: Attending: Hematology

## 2024-12-01 ENCOUNTER — Inpatient Hospital Stay

## 2024-12-01 ENCOUNTER — Inpatient Hospital Stay: Admitting: Hematology

## 2024-12-08 ENCOUNTER — Inpatient Hospital Stay: Admitting: Nurse Practitioner

## 2024-12-08 ENCOUNTER — Inpatient Hospital Stay

## 2024-12-23 ENCOUNTER — Inpatient Hospital Stay: Attending: Hematology

## 2024-12-23 ENCOUNTER — Inpatient Hospital Stay

## 2024-12-23 ENCOUNTER — Inpatient Hospital Stay: Admitting: Hematology

## 2025-01-06 ENCOUNTER — Inpatient Hospital Stay

## 2025-01-06 ENCOUNTER — Inpatient Hospital Stay: Admitting: Hematology

## 2025-01-20 ENCOUNTER — Inpatient Hospital Stay

## 2025-01-20 ENCOUNTER — Inpatient Hospital Stay: Admitting: Hematology

## 2025-01-20 ENCOUNTER — Inpatient Hospital Stay: Attending: Hematology
